# Patient Record
Sex: Female | Born: 1935 | Race: White | Hispanic: No | Marital: Married | Smoking: Never smoker
Health system: Southern US, Community
[De-identification: ages and names within clinical notes are randomized; demographics above are authoritative.]

## PROBLEM LIST (undated history)

## (undated) DIAGNOSIS — F329 Major depressive disorder, single episode, unspecified: Secondary | ICD-10-CM

## (undated) DIAGNOSIS — I509 Heart failure, unspecified: Secondary | ICD-10-CM

## (undated) DIAGNOSIS — E785 Hyperlipidemia, unspecified: Secondary | ICD-10-CM

## (undated) DIAGNOSIS — R002 Palpitations: Secondary | ICD-10-CM

## (undated) DIAGNOSIS — K219 Gastro-esophageal reflux disease without esophagitis: Secondary | ICD-10-CM

## (undated) DIAGNOSIS — I517 Cardiomegaly: Secondary | ICD-10-CM

## (undated) DIAGNOSIS — Z8719 Personal history of other diseases of the digestive system: Secondary | ICD-10-CM

## (undated) DIAGNOSIS — R0600 Dyspnea, unspecified: Secondary | ICD-10-CM

## (undated) DIAGNOSIS — R011 Cardiac murmur, unspecified: Secondary | ICD-10-CM

## (undated) DIAGNOSIS — H9190 Unspecified hearing loss, unspecified ear: Secondary | ICD-10-CM

## (undated) DIAGNOSIS — E119 Type 2 diabetes mellitus without complications: Secondary | ICD-10-CM

## (undated) DIAGNOSIS — R7611 Nonspecific reaction to tuberculin skin test without active tuberculosis: Secondary | ICD-10-CM

## (undated) DIAGNOSIS — N390 Urinary tract infection, site not specified: Secondary | ICD-10-CM

## (undated) DIAGNOSIS — F419 Anxiety disorder, unspecified: Secondary | ICD-10-CM

## (undated) DIAGNOSIS — F32A Depression, unspecified: Secondary | ICD-10-CM

## (undated) DIAGNOSIS — Z951 Presence of aortocoronary bypass graft: Secondary | ICD-10-CM

## (undated) DIAGNOSIS — I10 Essential (primary) hypertension: Secondary | ICD-10-CM

## (undated) DIAGNOSIS — Z9889 Other specified postprocedural states: Secondary | ICD-10-CM

## (undated) DIAGNOSIS — I341 Nonrheumatic mitral (valve) prolapse: Secondary | ICD-10-CM

## (undated) DIAGNOSIS — I2510 Atherosclerotic heart disease of native coronary artery without angina pectoris: Secondary | ICD-10-CM

## (undated) DIAGNOSIS — R112 Nausea with vomiting, unspecified: Secondary | ICD-10-CM

## (undated) DIAGNOSIS — I340 Nonrheumatic mitral (valve) insufficiency: Secondary | ICD-10-CM

## (undated) DIAGNOSIS — M1990 Unspecified osteoarthritis, unspecified site: Secondary | ICD-10-CM

## (undated) DIAGNOSIS — T7840XA Allergy, unspecified, initial encounter: Secondary | ICD-10-CM

## (undated) DIAGNOSIS — R51 Headache: Secondary | ICD-10-CM

## (undated) DIAGNOSIS — Z953 Presence of xenogenic heart valve: Secondary | ICD-10-CM

## (undated) HISTORY — DX: Major depressive disorder, single episode, unspecified: F32.9

## (undated) HISTORY — PX: PELVIC FLOOR REPAIR: SHX2192

## (undated) HISTORY — PX: ROTATOR CUFF REPAIR: SHX139

## (undated) HISTORY — PX: REDUCTION MAMMAPLASTY: SUR839

## (undated) HISTORY — PX: CATARACT EXTRACTION: SUR2

## (undated) HISTORY — DX: Nonrheumatic mitral (valve) prolapse: I34.1

## (undated) HISTORY — DX: Essential (primary) hypertension: I10

## (undated) HISTORY — DX: Cardiac murmur, unspecified: R01.1

## (undated) HISTORY — PX: EYE SURGERY: SHX253

## (undated) HISTORY — PX: COLONOSCOPY: SHX174

## (undated) HISTORY — DX: Gastro-esophageal reflux disease without esophagitis: K21.9

## (undated) HISTORY — DX: Hyperlipidemia, unspecified: E78.5

## (undated) HISTORY — PX: BACK SURGERY: SHX140

## (undated) HISTORY — DX: Depression, unspecified: F32.A

## (undated) HISTORY — PX: CARDIAC CATHETERIZATION: SHX172

## (undated) HISTORY — DX: Nonspecific reaction to tuberculin skin test without active tuberculosis: R76.11

## (undated) HISTORY — DX: Unspecified osteoarthritis, unspecified site: M19.90

## (undated) HISTORY — DX: Allergy, unspecified, initial encounter: T78.40XA

## (undated) HISTORY — DX: Palpitations: R00.2

## (undated) HISTORY — PX: ABDOMINAL HYSTERECTOMY: SHX81

## (undated) HISTORY — PX: TONSILLECTOMY AND ADENOIDECTOMY: SUR1326

## (undated) HISTORY — DX: Cardiomegaly: I51.7

## (undated) HISTORY — DX: Nonrheumatic mitral (valve) insufficiency: I34.0

---

## 1998-11-02 ENCOUNTER — Ambulatory Visit (HOSPITAL_COMMUNITY): Admission: RE | Admit: 1998-11-02 | Discharge: 1998-11-02 | Payer: Self-pay | Admitting: Gastroenterology

## 1999-04-25 ENCOUNTER — Encounter: Payer: Self-pay | Admitting: Cardiology

## 1999-04-25 ENCOUNTER — Inpatient Hospital Stay (HOSPITAL_COMMUNITY): Admission: EM | Admit: 1999-04-25 | Discharge: 1999-04-26 | Payer: Self-pay | Admitting: Emergency Medicine

## 1999-05-09 ENCOUNTER — Ambulatory Visit (HOSPITAL_COMMUNITY): Admission: RE | Admit: 1999-05-09 | Discharge: 1999-05-09 | Payer: Self-pay | Admitting: Family Medicine

## 1999-07-31 ENCOUNTER — Other Ambulatory Visit: Admission: RE | Admit: 1999-07-31 | Discharge: 1999-07-31 | Payer: Self-pay | Admitting: Gynecology

## 1999-08-31 ENCOUNTER — Encounter: Payer: Self-pay | Admitting: Family Medicine

## 1999-08-31 ENCOUNTER — Encounter: Admission: RE | Admit: 1999-08-31 | Discharge: 1999-08-31 | Payer: Self-pay | Admitting: Family Medicine

## 1999-09-08 ENCOUNTER — Encounter: Admission: RE | Admit: 1999-09-08 | Discharge: 1999-09-08 | Payer: Self-pay | Admitting: Family Medicine

## 1999-09-08 ENCOUNTER — Encounter: Payer: Self-pay | Admitting: Family Medicine

## 2000-08-19 ENCOUNTER — Other Ambulatory Visit: Admission: RE | Admit: 2000-08-19 | Discharge: 2000-08-19 | Payer: Self-pay | Admitting: Gynecology

## 2000-09-11 ENCOUNTER — Encounter: Admission: RE | Admit: 2000-09-11 | Discharge: 2000-09-11 | Payer: Self-pay | Admitting: Gynecology

## 2000-09-11 ENCOUNTER — Encounter: Payer: Self-pay | Admitting: Gynecology

## 2001-09-16 ENCOUNTER — Other Ambulatory Visit: Admission: RE | Admit: 2001-09-16 | Discharge: 2001-09-16 | Payer: Self-pay | Admitting: Gynecology

## 2001-09-25 ENCOUNTER — Encounter: Payer: Self-pay | Admitting: Gynecology

## 2001-09-25 ENCOUNTER — Encounter: Admission: RE | Admit: 2001-09-25 | Discharge: 2001-09-25 | Payer: Self-pay | Admitting: Gynecology

## 2001-09-26 ENCOUNTER — Encounter: Payer: Self-pay | Admitting: Gynecology

## 2001-09-26 ENCOUNTER — Encounter: Admission: RE | Admit: 2001-09-26 | Discharge: 2001-09-26 | Payer: Self-pay | Admitting: Gynecology

## 2002-01-05 ENCOUNTER — Encounter: Payer: Self-pay | Admitting: Orthopaedic Surgery

## 2002-01-05 ENCOUNTER — Ambulatory Visit (HOSPITAL_COMMUNITY): Admission: RE | Admit: 2002-01-05 | Discharge: 2002-01-05 | Payer: Self-pay | Admitting: Orthopaedic Surgery

## 2002-02-25 ENCOUNTER — Ambulatory Visit (HOSPITAL_BASED_OUTPATIENT_CLINIC_OR_DEPARTMENT_OTHER): Admission: RE | Admit: 2002-02-25 | Discharge: 2002-02-26 | Payer: Self-pay | Admitting: Plastic Surgery

## 2002-02-25 ENCOUNTER — Encounter (INDEPENDENT_AMBULATORY_CARE_PROVIDER_SITE_OTHER): Payer: Self-pay | Admitting: *Deleted

## 2002-02-25 HISTORY — PX: BREAST ENHANCEMENT SURGERY: SHX7

## 2002-10-07 ENCOUNTER — Other Ambulatory Visit: Admission: RE | Admit: 2002-10-07 | Discharge: 2002-10-07 | Payer: Self-pay | Admitting: Gynecology

## 2002-10-08 ENCOUNTER — Encounter: Payer: Self-pay | Admitting: Gynecology

## 2002-10-08 ENCOUNTER — Encounter: Admission: RE | Admit: 2002-10-08 | Discharge: 2002-10-08 | Payer: Self-pay | Admitting: Gynecology

## 2003-04-14 ENCOUNTER — Encounter: Admission: RE | Admit: 2003-04-14 | Discharge: 2003-04-14 | Payer: Self-pay | Admitting: Family Medicine

## 2003-04-14 ENCOUNTER — Encounter: Payer: Self-pay | Admitting: Family Medicine

## 2003-04-26 ENCOUNTER — Encounter: Payer: Self-pay | Admitting: Family Medicine

## 2003-04-26 ENCOUNTER — Encounter: Admission: RE | Admit: 2003-04-26 | Discharge: 2003-04-26 | Payer: Self-pay | Admitting: Family Medicine

## 2003-06-11 ENCOUNTER — Encounter: Admission: RE | Admit: 2003-06-11 | Discharge: 2003-06-11 | Payer: Self-pay | Admitting: Neurological Surgery

## 2003-06-11 ENCOUNTER — Encounter: Payer: Self-pay | Admitting: Neurological Surgery

## 2003-11-03 ENCOUNTER — Encounter: Admission: RE | Admit: 2003-11-03 | Discharge: 2003-11-03 | Payer: Self-pay | Admitting: Gynecology

## 2003-11-30 ENCOUNTER — Other Ambulatory Visit: Admission: RE | Admit: 2003-11-30 | Discharge: 2003-11-30 | Payer: Self-pay | Admitting: Gynecology

## 2004-08-08 ENCOUNTER — Encounter: Admission: RE | Admit: 2004-08-08 | Discharge: 2004-08-08 | Payer: Self-pay | Admitting: Rheumatology

## 2004-09-21 ENCOUNTER — Observation Stay (HOSPITAL_COMMUNITY): Admission: RE | Admit: 2004-09-21 | Discharge: 2004-09-22 | Payer: Self-pay | Admitting: Gynecology

## 2004-09-21 HISTORY — PX: OTHER SURGICAL HISTORY: SHX169

## 2004-11-10 ENCOUNTER — Encounter: Admission: RE | Admit: 2004-11-10 | Discharge: 2004-11-10 | Payer: Self-pay | Admitting: Gynecology

## 2005-03-28 ENCOUNTER — Other Ambulatory Visit: Admission: RE | Admit: 2005-03-28 | Discharge: 2005-03-28 | Payer: Self-pay | Admitting: Gynecology

## 2005-04-24 ENCOUNTER — Ambulatory Visit: Payer: Self-pay | Admitting: Cardiology

## 2006-01-08 ENCOUNTER — Encounter: Admission: RE | Admit: 2006-01-08 | Discharge: 2006-01-08 | Payer: Self-pay | Admitting: Gynecology

## 2006-05-10 ENCOUNTER — Ambulatory Visit: Payer: Self-pay | Admitting: Cardiology

## 2006-11-26 ENCOUNTER — Encounter: Admission: RE | Admit: 2006-11-26 | Discharge: 2006-11-26 | Payer: Self-pay | Admitting: Family Medicine

## 2006-12-03 ENCOUNTER — Other Ambulatory Visit: Admission: RE | Admit: 2006-12-03 | Discharge: 2006-12-03 | Payer: Self-pay | Admitting: Gynecology

## 2007-02-13 ENCOUNTER — Encounter: Admission: RE | Admit: 2007-02-13 | Discharge: 2007-02-13 | Payer: Self-pay | Admitting: Gynecology

## 2007-05-21 ENCOUNTER — Ambulatory Visit: Payer: Self-pay | Admitting: Cardiology

## 2007-05-31 ENCOUNTER — Encounter: Admission: RE | Admit: 2007-05-31 | Discharge: 2007-05-31 | Payer: Self-pay | Admitting: Family Medicine

## 2007-06-25 ENCOUNTER — Ambulatory Visit: Payer: Self-pay

## 2007-06-25 ENCOUNTER — Encounter: Payer: Self-pay | Admitting: Cardiology

## 2008-01-22 ENCOUNTER — Encounter: Admission: RE | Admit: 2008-01-22 | Discharge: 2008-01-22 | Payer: Self-pay | Admitting: Gastroenterology

## 2008-02-03 ENCOUNTER — Ambulatory Visit: Payer: Self-pay | Admitting: Cardiology

## 2008-02-24 ENCOUNTER — Encounter: Admission: RE | Admit: 2008-02-24 | Discharge: 2008-02-24 | Payer: Self-pay | Admitting: Family Medicine

## 2008-07-26 ENCOUNTER — Encounter: Admission: RE | Admit: 2008-07-26 | Discharge: 2008-07-26 | Payer: Self-pay | Admitting: Gastroenterology

## 2008-08-01 ENCOUNTER — Encounter: Admission: RE | Admit: 2008-08-01 | Discharge: 2008-08-01 | Payer: Self-pay | Admitting: Gastroenterology

## 2009-01-21 ENCOUNTER — Encounter: Admission: RE | Admit: 2009-01-21 | Discharge: 2009-01-21 | Payer: Self-pay | Admitting: Gastroenterology

## 2009-02-16 ENCOUNTER — Encounter: Payer: Self-pay | Admitting: Cardiology

## 2009-02-26 DIAGNOSIS — I080 Rheumatic disorders of both mitral and aortic valves: Secondary | ICD-10-CM | POA: Insufficient documentation

## 2009-02-26 DIAGNOSIS — I517 Cardiomegaly: Secondary | ICD-10-CM | POA: Insufficient documentation

## 2009-02-26 DIAGNOSIS — I059 Rheumatic mitral valve disease, unspecified: Secondary | ICD-10-CM | POA: Insufficient documentation

## 2009-02-26 DIAGNOSIS — R002 Palpitations: Secondary | ICD-10-CM | POA: Insufficient documentation

## 2009-03-08 ENCOUNTER — Ambulatory Visit: Payer: Self-pay | Admitting: Cardiology

## 2009-03-18 ENCOUNTER — Encounter: Admission: RE | Admit: 2009-03-18 | Discharge: 2009-03-18 | Payer: Self-pay | Admitting: Gynecology

## 2009-04-13 ENCOUNTER — Encounter: Admission: RE | Admit: 2009-04-13 | Discharge: 2009-04-13 | Payer: Self-pay | Admitting: Orthopedic Surgery

## 2009-04-14 ENCOUNTER — Ambulatory Visit (HOSPITAL_BASED_OUTPATIENT_CLINIC_OR_DEPARTMENT_OTHER): Admission: RE | Admit: 2009-04-14 | Discharge: 2009-04-14 | Payer: Self-pay | Admitting: Orthopedic Surgery

## 2009-07-29 ENCOUNTER — Encounter: Admission: RE | Admit: 2009-07-29 | Discharge: 2009-07-29 | Payer: Self-pay | Admitting: Gastroenterology

## 2009-09-10 HISTORY — PX: BREAST SURGERY: SHX581

## 2009-09-26 ENCOUNTER — Telehealth (INDEPENDENT_AMBULATORY_CARE_PROVIDER_SITE_OTHER): Payer: Self-pay | Admitting: *Deleted

## 2010-02-14 ENCOUNTER — Ambulatory Visit: Payer: Self-pay | Admitting: Cardiology

## 2010-02-14 DIAGNOSIS — I10 Essential (primary) hypertension: Secondary | ICD-10-CM | POA: Insufficient documentation

## 2010-03-29 ENCOUNTER — Encounter: Admission: RE | Admit: 2010-03-29 | Discharge: 2010-03-29 | Payer: Self-pay | Admitting: Gynecology

## 2010-07-10 ENCOUNTER — Ambulatory Visit (HOSPITAL_COMMUNITY): Admission: RE | Admit: 2010-07-10 | Discharge: 2010-07-10 | Payer: Self-pay | Admitting: Neurological Surgery

## 2010-07-26 ENCOUNTER — Encounter: Admission: RE | Admit: 2010-07-26 | Discharge: 2010-07-26 | Payer: Self-pay | Admitting: Gastroenterology

## 2010-08-14 ENCOUNTER — Inpatient Hospital Stay (HOSPITAL_COMMUNITY)
Admission: RE | Admit: 2010-08-14 | Discharge: 2010-08-21 | Payer: Self-pay | Source: Home / Self Care | Attending: Neurological Surgery | Admitting: Neurological Surgery

## 2010-10-02 ENCOUNTER — Encounter: Payer: Self-pay | Admitting: Gynecology

## 2010-10-10 NOTE — Assessment & Plan Note (Signed)
Summary: 1610   Primary Provider:  Donia Guiles MD  CC:  Yearly rov/ pt reports edema in left leg and discoloration.[name]  History of Present Illness: [name] comes in today for evaluation and management of her history of mitral valve prolapse, mild mitral regurgitation, palpitations, and her hypertension.  She has complained of some lower extremity edema and color changes in her feet when she sits for prolonged periods of time. She wonders if she has PAD.  She clearly has varicose veins. There is no history of saline as her DVT. She has no claudication.  She has palpitations at night which she equates with her reflux. She is taking Protonix twice a day. When she sits up and burped a few times the palpitations improve.  She denies orthopnea, PND, or syncope.  Current Medications (verified): 1)  Lisinopril-Hydrochlorothiazide 10-12.5 Mg Tabs (Lisinopril-Hydrochlorothiazide) .[name].. 1 Tab Once Daily 2)  Protonix 40 Mg Tbec (Pantoprazole Sodium) .[name].. 1 Tab Two Times A Day 3)  Diltiazem Hcl Er Beads 240 Mg Xr24h-Cap (Diltiazem Hcl Er Beads) .[name].. 1 Cap Once Daily 4)  Hyoscyamine Sulfate 0.125 Mg Tabs (Hyoscyamine Sulfate) .... As Needed 5)  Lorazepam 1 Mg Tabs (Lorazepam) .... As Needed 6)  Citalopram Hydrobromide 10 Mg Tabs (Citalopram Hydrobromide) .... Take One Tablet Once Daily  Allergies (verified): 1)  ! Sulfa 2)  ! Codeine  Past History:  Past Medical History: Last updated: Feb 28, 2009 ATRIAL ENLARGEMENT, LEFT (ICD-429.3) MITRAL REGURGITATION (ICD-396.3) MITRAL VALVE PROLAPSE (ICD-424.0) PALPITATIONS (ICD-785.1)    Past Surgical History: Last updated: 02/28/2009  Anterior posterior and enterocele repairs with uterosacral cardinal colposuspension, partial colpocleisis.  SURGEON:  Gretta Cool, M.D.[name]. 09/21/2004  Bilateral reduction mammoplasty and excision of accessory breast tissue underneath the left breast. SURGEON:  Consuello Bossier., M.D.[name]. 02/25/02  Family  History: Last updated: 28-Feb-2009 Mother: deceased at 27..homocide Father: deceased at 22..heart faliure  Social History: Last updated: 02/28/2009 Retired  Married  Tobacco Use - No.  Alcohol Use - yes Regular Exercise - yes Drug Use - no  Risk Factors: Exercise: yes (2009/02/28)  Risk Factors: Smoking Status: never (02/28/09)  Review of Systems       negative other than history of present illness  Vital Signs:  Patient profile:   77 year old female Height:      66 inches Weight:      129 pounds BMI:     20.90 Pulse rate:   76 / minute Pulse rhythm:   irregular BP sitting:   138 / 72  (left arm) Cuff size:   regular  Vitals Entered By: Judithe Modest CMA (February 14, 2010 9:39 AM)  Physical Exam  General:  no acute distress, she has lost considerable weight from recent surgeries Head:  normocephalic and atraumatic Eyes:  PERRLA/EOM intact; conjunctiva and lids normal. Neck:  Neck supple, no JVD. No masses, thyromegaly or abnormal cervical nodes. Chest Ezana Hubbert:  no deformities or breast masses noted Lungs:  Clear bilaterally to auscultation and percussion. Heart:  PMI nondisplaced, normal S1, systolic murmur with no obvious click. Normal S2. No gallop Msk:  Back normal, normal gait. Muscle strength and tone normal. Pulses:  dorsalis pedis 2+ over 4+ bilaterally, posterior tibial 1+ over 4+. Her toes are cool with decreased capillary reflex. Extremities:  trace left pedal edema and trace right pedal edema. varicose veins are present without signs of cellulitis or DVT. Neurologic:  Alert and oriented x 3. Skin:  Intact without lesions or rashes. Psych:  Normal  affect.   Problems:  Medical Problems Added: 1)  Dx of Hypertension, Benign  (ICD-401.1)  EKG  Procedure date:  02/14/2010  Findings:      normal sinus rhythm with occasional PVC, no acute changes.  Impression & Recommendations:  Problem # 1:  MITRAL VALVE PROLAPSE (ICD-424.0) Assessment  Unchanged  Her exam is stable I see no reason to repeat her echo. Her updated medication list for this problem includes:    Lisinopril-hydrochlorothiazide 10-12.5 Mg Tabs (Lisinopril-hydrochlorothiazide) .[name]... 1 tab once daily  Orders: EKG w/ Interpretation (93000)  Problem # 2:  PALPITATIONS (ICD-785.1) Assessment: Unchanged  These seem to be related to some reflux symptoms. I have made her aware that diltiazem can lower esophageal sphincter tone and may make this worse. She would like to stay on this medicine since she's doing so well with it otherwise. She will continue protonic twice a day. Her updated medication list for this problem includes:    Lisinopril-hydrochlorothiazide 10-12.5 Mg Tabs (Lisinopril-hydrochlorothiazide) .[name]... 1 tab once daily    Diltiazem Hcl Er Beads 240 Mg Xr24h-cap (Diltiazem hcl er beads) .[name]... 1 cap once daily  Orders: EKG w/ Interpretation (93000)  Problem # 3:  HYPERTENSION, BENIGN (ICD-401.1) Assessment: Unchanged  Her updated medication list for this problem includes:    Lisinopril-hydrochlorothiazide 10-12.5 Mg Tabs (Lisinopril-hydrochlorothiazide) .[name]... 1 tab once daily    Diltiazem Hcl Er Beads 240 Mg Xr24h-cap (Diltiazem hcl er beads) .[name]... 1 cap once daily  Problem # 4:  MITRAL REGURGITATION (ICD-396.3) Assessment: Unchanged  Orders: EKG w/ Interpretation (93000)  Patient Instructions: 1)  Your physician recommends that you schedule a follow-up appointment in: 1 year with Dr. Daleen Squibb 2)  Your physician recommends that you continue on your current medications as directed. Please refer to the Current Medication list given to you today. Prescriptions: DILTIAZEM HCL ER BEADS 240 MG XR24H-CAP (DILTIAZEM HCL ER BEADS) 1 cap once daily  #90 x 4   Entered by:   Lisabeth Devoid RN   Authorized by:   Gaylord Shih, MD, [facility]   Signed by:   Lisabeth Devoid RN on 02/14/2010   Method used:   Faxed to ...       [facility]  (mail-order)             , [place]         Ph:        Fax: [phone]   RxID:   [id number] LISINOPRIL-HYDROCHLOROTHIAZIDE 10-12.5 MG TABS (LISINOPRIL-HYDROCHLOROTHIAZIDE) 1 tab once daily  #90 x 4   Entered by:   Lisabeth Devoid RN   Authorized by:   Gaylord Shih, MD, [facility]   Signed by:   Lisabeth Devoid RN on 02/14/2010   Method used:   Faxed to ...       [facility] (mail-order)             , [place]         Ph:        Fax: [phone]   RxID:   [id number]

## 2010-10-10 NOTE — Progress Notes (Signed)
  Phone Note Other Incoming   Caller: Rosalita Chessman Action Taken: Information Sent Initial call taken by: Denny Peon    Faxed 12 lead,LOV, over to [facility] to fax [phone] [facility]  September 26, 2009 9:22 AM

## 2010-11-02 NOTE — Discharge Summary (Signed)
NAMEOCTA, UPLINGER                 ACCOUNT NO.:  [id number]  MEDICAL RECORD NO.:  [id number]          PATIENT TYPE:  INP  LOCATION:  3002                         FACILITY:  [facility]  PHYSICIAN:  Stefani Dama, M.D.  DATE OF BIRTH:  02/23/1934  DATE OF ADMISSION:  08/14/2010 DATE OF DISCHARGE:  08/21/2010                              DISCHARGE SUMMARY   ADMITTING DIAGNOSIS:  Lumbar spondylosis and multifactorial spinal stenosis and degenerative disk disease L1-2, L2-3, L3-4, and L4-5.  DISCHARGE DIAGNOSIS:  Lumbar spondylosis and multifactorial spinal stenosis and degenerative disk disease L1-2, L2-3, L3-4, and L4-5.  OPERATIONS AND PROCEDURES:  Anterolateral decompression and fusion L1-2, L2-3, L3-4, and L4-5 stage I done on August 14, 2010 followed by second operation on August 17, 2010 of posterior supplemental pedicle screw fixation, minimally invasive L1-2, L2-3, L3-4 and L4-5.  BRIEF HISTORY AND HOSPITAL COURSE:  The patient is a 77 year old female who has degenerative lumbar scoliosis and spondylosis with lumbar radiculopathy L1-2, L2-3, L3-4, and L4-5 and she has had increasing difficulty over the last several years.  She has failed all conservative measurements and her degenerative scoliosis curve has progressed substantially in the last year, has been advised she undergo surgical decompression of two-staged procedure, anterolateral derotation and decompression using XLIF technique and then followed by minimally invasive posterior spinal pedicle screw fixation percutaneously L1 through L5.  She elects to proceed, tolerates stage I on August 14, 2010 well postoperatively.  She was first day doing well, getting out of bed, eating breakfast, vital signs stable, afebrile, neurovascularly intact, no focal deficits.  Started with physical therapy, occupational therapy, kept on her home regimen of medications, given Toradol for pain control and her Foley catheter was  discontinued.  She continue to make good progress, was ready for stage II on August 17, 2010.  She tolerated the posterior supplemental fixation at L1-L5 with pedicle screw fixation percutaneously.  First day postoperatively from that, she did have some right quad pain.  Her dressing was dry.  She was neurovascularly intact and once again, was initiated therapy with physical therapy and mobilization.  She was transferred out to the Neurosurgical ICU.  Her Foley catheter was discontinued.  She was placed on PCA pain pumps after both surgeries postoperatively and then weaned to p.o. pain medications.  She has some IV Toradol for pain control as well and she was ready for discharge home on August 21, 2010, eating well, voiding well, ambulating safely.  She had learned her back precautions, taking Dilaudid and Valium for pain control.  DISCHARGE CONDITION:  Stable and improved.  DISCHARGE INSTRUCTIONS:  Discharge home.  Continue on her home medications of diltiazem 240 mg one p.o. daily; Pepcid 20 mg one p.o. daily; hyoscyamine 125 mcg one p.o. daily; lorazepam 0.5 mg one p.o. daily; Protonix 40 mg one p.o. nightly; lisinopril/hydrochlorothiazide 10/12.5 one p.o. daily; MiraLax one capsule b.i.d. and a good home bowel regimen.  She was also given Dilaudid 4 mg one p.o. q.4-6 h. p.r.n. pain and Valium 5 mg one p.o. q.8-12 h. p.r.n. muscle spasm.  She is to follow up in our  office in 3-4 weeks for postoperative check. Contact our office prior to followup if any question or concerns. Continue with progressive ambulation, 3-4 short walks a day.  Increase for distance and time if pain allows.  Contact our office if she has any problems or concerns.  Home health durable medical equipment is ordered and home health physical therapy.     Aura [facility].   ______________________________ Stefani Dama, M.D.    SCI/MEDQ  D:  09/28/2010  T:  09/28/2010  Job:   [id number]  Electronically Signed by Orlin Hilding P.A. on 10/25/2010 05:02:01 PM Electronically Signed by Barnett Abu M.D. on 11/02/2010 08:01:10 AM

## 2010-11-21 LAB — CBC
MCH: 31.8 pg (ref 26.0–34.0)
MCHC: 33.9 g/dL (ref 30.0–36.0)
Platelets: 292 10*3/uL (ref 150–400)
RBC: 3.62 MIL/uL — ABNORMAL LOW (ref 3.87–5.11)
RDW: 13.3 % (ref 11.5–15.5)

## 2010-11-21 LAB — BASIC METABOLIC PANEL
BUN: 10 mg/dL (ref 6–23)
CO2: 27 mEq/L (ref 19–32)
Calcium: 8.7 mg/dL (ref 8.4–10.5)
Creatinine, Ser: 0.73 mg/dL (ref 0.4–1.2)
GFR calc Af Amer: >60 mL/min (ref >60)

## 2010-11-22 LAB — BASIC METABOLIC PANEL
BUN: 13 mg/dL (ref 6–23)
Calcium: 9.9 mg/dL (ref 8.4–10.5)
Chloride: 102 mEq/L (ref 96–112)
Creatinine, Ser: 0.81 mg/dL (ref 0.4–1.2)
GFR calc Af Amer: >60 mL/min (ref >60)
GFR calc non Af Amer: >60 mL/min (ref >60)

## 2010-11-22 LAB — CBC
Platelets: 348 10*3/uL (ref 150–400)
RBC: 4.55 MIL/uL (ref 3.87–5.11)
RDW: 13.2 % (ref 11.5–15.5)
WBC: 7.7 10*3/uL (ref 4.0–10.5)

## 2010-11-22 LAB — SURGICAL PCR SCREEN
MRSA, PCR: NEGATIVE
Staphylococcus aureus: NEGATIVE

## 2010-11-22 LAB — TYPE AND SCREEN
ABO/RH(D): O NEG
Antibody Screen: NEGATIVE

## 2010-12-17 LAB — BASIC METABOLIC PANEL
CO2: 27 mEq/L (ref 19–32)
Calcium: 9.4 mg/dL (ref 8.4–10.5)
Chloride: 98 mEq/L (ref 96–112)
Creatinine, Ser: 0.71 mg/dL (ref 0.4–1.2)
Glucose, Bld: 148 mg/dL — ABNORMAL HIGH (ref 70–99)
Potassium: 3.6 mEq/L (ref 3.5–5.1)
Sodium: 133 mEq/L — ABNORMAL LOW (ref 135–145)

## 2010-12-22 ENCOUNTER — Other Ambulatory Visit (HOSPITAL_COMMUNITY): Payer: Self-pay | Admitting: Neurological Surgery

## 2010-12-22 DIAGNOSIS — M545 Low back pain, unspecified: Secondary | ICD-10-CM

## 2011-01-15 ENCOUNTER — Ambulatory Visit (HOSPITAL_COMMUNITY)
Admission: RE | Admit: 2011-01-15 | Discharge: 2011-01-15 | Disposition: A | Discharge status: Home / Self Care | Payer: Medicare Other | Source: Ambulatory Visit | Attending: Neurological Surgery | Admitting: Neurological Surgery

## 2011-01-15 DIAGNOSIS — M545 Low back pain, unspecified: Secondary | ICD-10-CM

## 2011-01-15 DIAGNOSIS — M79609 Pain in unspecified limb: Secondary | ICD-10-CM | POA: Insufficient documentation

## 2011-01-15 DIAGNOSIS — Z981 Arthrodesis status: Secondary | ICD-10-CM | POA: Insufficient documentation

## 2011-01-15 DIAGNOSIS — M4180 Other forms of scoliosis, site unspecified: Secondary | ICD-10-CM | POA: Insufficient documentation

## 2011-01-15 MED ORDER — IOHEXOL 180 MG/ML  SOLN
20.0000 mL | Freq: Once | INTRAMUSCULAR | Status: AC | PRN
  Administered 2011-01-15: 20 mL via INTRATHECAL

## 2011-01-23 NOTE — Assessment & Plan Note (Signed)
[facility]                            CARDIOLOGY OFFICE NOTE   NAME:Mccarthy, Lori E                        MRN:          [id number]  DATE:02/03/2008                            DOB:          February 15, 1934    [name] comes in today because of increased palpitations.   She has been taking a fair amount of decongestants over the last couple  of months.  She does not know the name of the drug today, but she gets  it behind-the-counter.   She is very compliant with her other medications including diltiazem  extended release 240 mg a day and lisinopril and hydrochlorothiazide  10/12.5 mg.   Her last echocardiogram showed prolapse with EF of 60%.  She had mild  left atrial enlargement.  There was mild mitral regurgitation.   Her other medications were simvastatin 40 mg nightly, pantoprazole 40 mg  b.i.d., and Lexapro 10 mg nightly.   PHYSICAL EXAMINATION:  Her blood pressure today is 116/68, which is very  good for her.  Her heart rate is 80.  An EKG shows frequent PVCs that  are multifocal.  They have right bundle branch block morphology.  Her  weight is 143, down 10, which she has lost by reducing her carb intake.  HEENT:  Unchanged.  Carotid upstrokes equal bilaterally without bruits.  No JVD.  Thyroid is  not enlarged.  Trachea is midline.  LUNGS:  Clear.  HEART:  Irregular rate and rhythm.  No gallop.  ABDOMEN:  Soft and good bowel sounds.  No midline bruits.  EXTREMITIES:  No sinus, clubbing, or edema.  Pulses are intact.   At this point in time, I have asked [name] to eliminate the  decongestants.  If this does not improve her symptoms, we can go up on  diltiazem to 360 mg a day.  If this does not work, we can switch it to a  beta-blocker, which I think she probably would not tolerate very well.  She will let us know.  Otherwise, we will see her back in a year.     Thomas C. Daleen Squibb, MD, [facility]  Electronically Signed    TCW/MedQ  DD:  02/03/2008  DT: 02/03/2008  Job #: [id number]   cc:   Gretta Cool, M.D.  Donia Guiles, M.D.

## 2011-01-23 NOTE — Op Note (Signed)
NAMEJULIANY, [name]                 ACCOUNT NO.:  [id number]   MEDICAL RECORD NO.:  [id number]          PATIENT TYPE:  AMB   LOCATION:  DSC                          FACILITY:  [facility]   PHYSICIAN:  Rodney A. Mortenson, M.D.DATE OF BIRTH:  02-12-1934   DATE OF PROCEDURE:  04/14/2009  DATE OF DISCHARGE:                               OPERATIVE REPORT   JUSTIFICATION:  This 77 year old female starting January of this year  developed a persistent right shoulder pain.  She had a subacromial  injection, gave relief for about 3 months.  Because of persistent pain,  an MRI which showed some degenerative changes in the distal end of the  supraspinatus but no tear was seen.  AC joint was normal and type 1  acromion.  Pain in the shoulder has gotten progressively worse.  Because  of failure of conservative care, she is now admitted for arthroscopic  evaluation.  Complications discussed preoperatively.  Questions were  answered and encouraged.  I believe she probably has a full-thickness  tear, which did not show up on the MRI because of her persistent pain  and discomfort.  The patient clearly understands that no guarantee can  be given.   JUSTIFICATION FOR OUTPATIENT SURGERY:  Minimal morbidity.   PREOPERATIVE DIAGNOSES:  1. Supraspinatus bursitis, right shoulder.  2. Possible partial tear rotator cuff, right shoulder.   POSTOPERATIVE DIAGNOSIS:  Full-thickness retracted tear of the  supraspinatus right shoulder.   OPERATIONS:  Diagnostic arthroscopy; open acromioplasty and open repair  of retracted full-thickness tear rotator cuff using Biomet PEEK anchors.   SURGEON:  Lenard Galloway. Chaney Malling, MD   ANESTHESIA:  General.   PROCEDURE:  The patient was placed on the operating table in supine  position.  After satisfactory general anesthesia, the patient was placed  in the sitting position.  Right upper extremity and shoulder was prepped  with DuraPrep and draped out in the usual manner.  An  arthroscope was  placed in the posterior portal and anterior portals made for the  operative instruments.  Very careful examination of the shoulder was  undertaken.  The articular cartilage of the humeral head and the glenoid  was absolutely normal as was the subscapularis and the labrum.  Biceps  tendon appeared normal with good anchor and superior margin of the  glenoid.  There was marked fraying and synovitis adjacent to the biceps  tendon. The rotator cuff which had a full thickness and retracted tear.  One could view the subacromial space in the glenohumeral joint.  The  arthroscope was then placed in subacromial space and this confirmed a  large retracted rotator cuff tear.  From the subacromial area, one could  see the glenoid.  The arthroscope was then removed.It should be noted  that without diaganostic arthroscopy the correct diagnosis would not  have been made. The MRI was negative.   Saber-cut incision made over the anterolateral aspect of the right  shoulder.  Skin edges were retracted.  Bleeders coagulated.  Deltoid  fibers were split with a finger.  A self-retaining retractor was put in  place.  There was a very large bursa in this area, and this was  meticulously removed.  Throughout the procedure, the shoulder was  irrigated with copious amounts of saline solution.  A large retracted  rotator cuff tear was seen.  This was markedly frayed and friable.  The  margins were excised.  The footprint was identified, and this was  debrided with a rongeur to give [place] bleeding bone.  A Biomet PEEK anchor  was then inserted in a standard manner with 2 FiberWire sutures from the  anchor and both the anterior and posterior leaves of the supraspinatus  was then brought down onto the footprint.  An absolute anatomic very  snug fitting stable repair was achieved.  Once this was accomplished to  my satisfaction, deltoid fibers were attached side-to-side with heavy  Vicryl.  A running  subcuticular stitch was then put in place to close  the skin.  Benzoin and Steri-Strips were applied, and the sterile  dressings were applied, and the patient returned to recovery room in  excellent condition.  Technically, this procedure went extremely well.  I was very pleased with surgical construct and stability and range of  motion.   DISPOSITION:  1. Dilaudid for pain.  2. Sling to be wear at night.  3. Usual postoperative instructions.  4. To my office on Wednesday.      Rodney A. Chaney Malling, M.D.  Electronically Signed     RAM/MEDQ  D:  04/14/2009  T:  04/15/2009  Job:  [id number]

## 2011-01-23 NOTE — Assessment & Plan Note (Signed)
[facility]                            CARDIOLOGY OFFICE NOTE   NAME:Mccarthy, Lori E                        MRN:          [id number]  DATE:05/21/2007                            DOB:          1934-02-04    SUBJECTIVE:  [name] returns today for the following issues:  1. Mitral valve prolapse.  2. Hypertension.  3. Palpitations.   She has been told whenever she goes to Dr. Roselie Skinner office or  elsewhere and has her heart rate checked that she is having palpitations  or premature beats.  She really doesn't feel these most of the time.   The last time she was in she was complaining of some dizziness that  sounded like orthostatic hypotension.  She is really not having that  now.  She was under a lot of stress at the time.  We made an effort to  change her diltiazem to 240 mg once a day but she is still taking 180  b.i.d.  She is on lisinopril/HCTZ 10/12.5 daily.   Her last 2-D echocardiogram was in 2005 which showed moderate mitral  valve prolapse with moderate regurgitation.  She had a mildly dilated  left atrium.   CURRENT MEDICATIONS:  1. Diltiazem 180 mg b.i.d.  2. Lisinopril/HCTZ 10/12.5 mg daily.  3. Lexapro 10 mg q.h.s.  4. Crestor 10 mg daily.  5. Vivelle patch.  6. Zegerid 40 mg p.o. daily.   PHYSICAL EXAMINATION:  VITAL SIGNS:  Blood pressure is 100/68, her pulse  is 68 and slightly irregular.  Her electrocardiogram shows sinus rhythm  with PAC's and PVC's.  Her PR, QRS and QTC intervals are normal.  Her  weight is 153.  HEENT:  Normocephalic, atraumatic.  Pupils equal, round, reactive to  light and accommodation..  Extraocular movements intact. Sclerae are  slightly injected.  Facial symmetry is normal.  NECK:  Carotids are full.  There is no jugular venous distention.  Thyroid is not enlarged.  Trachea is midline.  LUNGS:  Clear.  HEART:  Reveals a mid systolic click.  There is a soft systolic murmur  at the apex.  S2 split  physiologically.  ABDOMEN:  Soft, good bowel sounds.  EXTREMITIES:  Reveal no edema.  Pulses are intact.  NEUROLOGICAL:  Exam is intact.  SKIN:  Unremarkable.   DISCUSSION:  [name] is having more concerns about her palpitations  though she does not feel them most of the time.  She does have PAC's and  PVC's on her EKG today.  These are probably from her mitral valve  prolapse.  We have not performed a 2-D echocardiogram to objectively  assess this in three years.  We will obtain this.   I have reviewed her prescriptions and have told her to stay on the  current medications.  If her 2-D echocardiogram is unchanged, i.e., good  left ventricular function, moderate mitral valve prolapse with no  significant change, we will give reassurance and continue her current  program.  I will plan on seeing her back in one year.     Thomas C.  Daleen Squibb, MD, [facility]  Electronically Signed    TCW/MedQ  DD: 05/21/2007  DT: 05/22/2007  Job #: [id number]   cc:   Donia Guiles, M.D.  Gretta Cool, M.D.

## 2011-01-26 NOTE — H&P (Signed)
NAME:  [name], [name]                 ACCOUNT NO.:  [id number]   MEDICAL RECORD NO.:  [id number]          PATIENT TYPE:   LOCATION:                                 FACILITY:   PHYSICIAN:  Gretta Cool, M.D.      DATE OF BIRTH:   DATE OF ADMISSION:  09/21/2004  DATE OF DISCHARGE:                                HISTORY & PHYSICAL   A 77 year old gravida 4, para 1, AB3 with progressively severe pelvic organ  prolapse now with grade IV cystocele with enterocele, rectocele, and fascial  detachment with unsatisfactory conservative measures at containment and  control.  Now wishes definitive therapy by anterior/posterior enterocele  repairs, colposuspension, and partial upper colpocleisis.  Her husband is  not able to be sexually active.  We have explained possible slight  shortening of the vagina, possible need for dilators to return to sexual  activity.  She is now admitted for definitive therapy with repairs as above.   PAST MEDICAL HISTORY:  Usual childhood disease without sequela.   MEDICAL ILLNESSES:  1.  Hypertension.  2.  Dyslipidemia.  3.  Osteoporosis.  4.  IBS.  5.  Diverticulosis.  6.  Hiatal hernia.   PAST SURGICAL HISTORY:  1.  History of abdominal hysterectomy, salpingo-oophorectomy 1991 for      abnormal uterine bleeding recurrent and unresponsive.  2.  History of breast biopsy and breast reduction 2003.  3.  Rotator cuff surgery, Dr. Cleophas Dunker 2003.   CURRENT MEDICATIONS:  1.  Tricor 154 mg daily.  2.  Prevacid daily for hiatal hernia.  3.  Atenolol 25 mg b.i.d.  4.  HCTZ 25 mg daily.  5.  Anticholinergic for IBS.  6.  Estrace vaginal cream.  7.  Zelnorm b.i.d. for chronic constipation.   HABITS:  Denies ethanol or tobacco.  Denies recreational drugs.   FAMILY HISTORY:  Father died at 19 of MI.  Two brothers with hypertension.  Paternal grandmother and maternal grandfather with heart disease.  One  brother with history of MI.  No family history of cancer  or other hereditary  disease.   SOCIAL HISTORY:  Patient is retired along with her husband.  They have one  child early 77s.  No significant life stresses.   REVIEW OF SYSTEMS:  HEENT:  Denies symptoms.  CARDIORESPIRATORY:  Denies  asthma, cough, bronchitis, shortness of breath.  GI/GU:  Denies frequency,  urgency, dysuria, change in bowel habits, food intolerance.   PHYSICAL EXAMINATION:  GENERAL:  Well-developed, well-nourished, bronze-tone  skin, appearing significantly younger than stated age.  HEENT:  Pupils are equal, round, and reactive to light and accommodate.  Fundi not examined.  Oropharynx clear.  NECK:  Supple without mass, thyroid enlargement.  CHEST:  Clear P to A.  HEART:  Regular rate and rhythm without murmur, cardiac enlargement.  BREASTS:  Bilateral reduction mammoplasty with excellent cosmetic result  without mass, nodes, or nipple discharge.  ABDOMEN:  Soft, scaphoid without mass or organomegaly.  PELVIC:  External genitalia.  Normal female.  Vagina clean, but with severe  pelvic organ prolapse with  grade IV prolapse of anterior vaginal wall.  She  has severe fascial detachment of the posterior vaginal wall with grade III  rectocele/enterocele.  She has diastasis of the levator ani group and  bulging of her bladder completely through the introitus even in supine  position with minimal straining.  Rectovaginal examination confirms.  EXTREMITIES:  Negative.  NEUROLOGIC:  Physiologic.   IMPRESSION:  1.  Grade IV cystocele, grade III rectocele and enterocele with severe      fascial detachment, vaginal vault prolapse.  2.  History of mitral valve prolapse, need prophylaxis.  3.  Dyslipidemia.  4.  Hypertension.  5.  Irritable bowel syndrome.  6.  Chronic constipation.   PLAN:  Anterior, posterior, and enterocele repairs, colposuspension, partial  colpocleisis.       ___________________________________________  Gretta Cool, M.D.    CWL/MEDQ  D:   09/21/2004  T:  09/21/2004  Job:  [id number]   cc:   Donia Guiles, M.D.  [address] [place]  [place] [address]  Fax: [phone]

## 2011-01-26 NOTE — Op Note (Signed)
[place]  Patient:    [name], [name] Visit Number: [id number] MRN: [id number]          Service Type: DSU Location: [facility] Attending Physician:  Consuello Bossier Dictated by:   Consuello Bossier., M.D. Proc. Date: 02/25/02 Admit Date:  02/25/2002                             Operative Report  PREOPERATIVE DIAGNOSIS:  Symptomatic bilateral mammary hypertrophy with excessive breast tissue beneath the left breast.  POSTOPERATIVE DIAGNOSIS:  Symptomatic bilateral mammary hypertrophy with excessive breast tissue beneath the left breast.  OPERATION PERFORMED:  Bilateral reduction mammoplasty and excision of accessory breast tissue underneath the left breast.  SURGEON:  Consuello Bossier., M.D.  ASSISTANT:  Teena Irani. Odis Luster, M.D.  ANESTHESIA:  General endotracheal.  OPERATIVE FINDINGS:  The patient had symptomatic bilateral mammary hypertrophy with discomfort in the chest, upper shoulder and back area.  She also had an excessive amount of what appeared to be breast tissue beneath the left breast which interfered with her wearing bras and was irritating.  The above surgical procedure was carried out.  DESCRIPTION OF PROCEDURE:  The patient was brought to the operating room and given a general endotracheal anesthetic, prepped about the breast and the left upper abdomen in sterile fashion.  She had been marked in the upright position for the planned surgical procedure.  What appeared to be an excessive amount of accessory breast tissue beneath the left breast was excised in a transverse direction as an ellipse.  Approximately 84 gm of tissue were removed. Bleeding was controlled with the electrocautery unit.  The wound was closed first with subcutaneous #3-0 Vicryl followed by running subcuticular and #4-0 Monocryl producing a 15 cm in length transverse closure.  This wound was closed by the application of Steri-Strips.  Following  this, attention was then turned to the reduction mammoplasty.  The key hole area as well as inferior pedicle were de-epithelialized.  The inframammary incision was made and continued down until the pectoralis major fascia was encountered.  The dissection was continued upward at the level of the fascia to an area above the new nipple position.  An incision was made in the pedicle just above the level of the nipple and continued downward to the pectoralis major fascia and then it was continued upward leaving a 1 cm in depth superior pedicle.  A medial full thickness amount of breast tissue was removed as a triangular segment medially in continuity with the central segment as well as with the lateral larger segment.  538 gm of tissue were removed from the left breast and 84 gm of this accessory tissue for a total of 622 on the right breast, 688 for an amount in excess of 1300 grams of tissue being removed from both breasts.  The medial and lateral flaps were brought together to a predetermined position along the inframammary line with an interrupted #3-0 Vicryl.  The upper part of the vertical incision was also closed with a #3-0 Vicryl.  The circumareolar, vertical and inframammary incisions were closed with interrupted subcutaneous #3-0 Vicryl followed by running subcuticular #4-0 Monocryl.  Nipple color was excellent and the breast appeared to be symmetrical.  Steri-Strips, Xeroform, fluffs, ABD and a circumthoracic Ace bandage were then applied.  The patient tolerated the procedure well and was able to be discharged from the operating room to the recovery room  subsequently to be [facility] for [facility] observation. Dictated by:   Consuello Bossier., M.D. Attending Physician:  Consuello Bossier DD:  02/25/02 TD:  02/26/02 Job: [id number]

## 2011-01-26 NOTE — Op Note (Signed)
NAMESUELLA, COGAR                 ACCOUNT NO.:  [id number]   MEDICAL RECORD NO.:  [id number]          PATIENT TYPE:  OBV   LOCATION:  0442                         FACILITY:  [facility]   PHYSICIAN:  Gretta Cool, M.D. DATE OF BIRTH:  January 08, 1934   DATE OF PROCEDURE:  09/21/2004  DATE OF DISCHARGE:                                 OPERATIVE REPORT   PREOPERATIVE DIAGNOSES:  1.  Severe grade 4 pelvic organ prolapse with vaginal vault descensus, grade      4 cystocele rectocele enterocele.  2.  Severe fascial detachment.  3.  Levator diastasis.   POSTOPERATIVE DIAGNOSES:  1.  Severe grade 4 pelvic organ prolapse with vaginal vault descensus, grade      4 cystocele rectocele enterocele.  2.  Severe fascial detachment.  3.  Levator diastasis.   PROCEDURE:  Anterior posterior and enterocele repairs with uterosacral  cardinal colposuspension, partial colpocleisis.   SURGEON:  Gretta Cool, M.D.   ANESTHESIA:  General orotracheal.   DESCRIPTION OF PROCEDURE:  Under excellent general anesthesia with the  patient prepped and draped as a sterile field in [employer] stirrups with Foley  catheter draining her bladder, the apex of the vagina was grasped with Allis  clamps and then drawn through the introitus. An incision was made  transversely just above the previous closure hysterectomy and the scarring  that identified the site. The mucosa was then dissected from the pelvic  fascia from the base of the bladder all the way to the apex and to the  urethra. The mucosa was then dissected from the endopelvic fashion, and the  enormous cystocele plicated by interrupted sutures of 2-0 Vicryl. The  endopelvic fascia was then plicated with interrupted mattress sutures of 2-0  Vicryl from suburethral to the base of the bladder. Once the bladder was  completely mobilized, Kelly plication sutures were placed on either side of  the urethra and attached to the symphysis. One suture was placed on each  side and secured. The urethra was thus elevated to high intra-abdominal  position. At this point, the mucosa was trimmed, and the mucosa closed with  a subcuticular closure of 2-0 Vicryl. The upper layers of endopelvic fascia  were also secured to the endopelvic fascia as the mucosa was closed. At this  point, attention was turned to the posterior repair. The mucosa was grasped  with Allis clamps and a V-shaped incision made toward the anus. The mucosa  was then incised from the introitus to the apex of the vaginal cuff, and the  mucosa dissected from the perirectal fascia. An enormous fascial defect was  noted with detachment of the fascia approximately 5 to 6 cm in length. The  detachment slipped from the apex of the vagina to near the introitus. The  cardinal uterosacral colposuspension was then performed by securing the  uterosacral cardinal complex as high as possible with sutures of 2-0  Novofil. Sutures were then placed through the perirectal fascia and the  fascia pulled to the apex of the vaginal cuff. Interrupted sutures were then  placed so as to  secure anterior fascial closure of the anterior vaginal wall  to the posterior perirectal fascia all the way across with interrupted  sutures of 2-0 Novofil. At this point, an upper colpocleisis was performed  with closure of the bladder pillars and anterior vaginal fascia to the  posterior fascia and to the uterosacral cardinal colposuspension. A series  of plication sutures were placed so as to close the upper vagina and plicate  the fascia in circumferential fashion. At this point, a suture of 2-0 Vicryl  was used to plicate the levator fascia from the introitus to the perineal  body. The perineal body muscles were then repaired, and the mucosa then  trimmed and the mucosa closed with a subcuticular closure from the apex of  the vagina to the introitus. At the end of the procedure, the sponge and  laps were correct. There were no  complications. A banana suprapubic  cystocatheter was placed and secured with 4-0 nylon. At this point, the  procedure was terminated without complications. The patient returned to  recovery room in excellent condition.      CWL/MEDQ  D:  09/21/2004  T:  09/21/2004  Job:  [id number]   cc:   Donia Guiles, M.D.  [address] [place]  [place] [address]  Fax: [phone]

## 2011-01-26 NOTE — Assessment & Plan Note (Signed)
[facility]                              CARDIOLOGY OFFICE NOTE   NAME:Mccarthy, Lori E                        MRN:          [id number]  DATE:05/10/2006                            DOB:          1933/11/03    PROGRESS NOTE:  [name] returns today for further management of her  hypertension and palpitations. She has been having some dizziness, that  sounds like orthostatic hypotension.   MEDICATIONS:  Include Diltiazem 180 bid, Lisinopril/HCTZ 10/12.5.   PHYSICAL EXAMINATION:  VITAL SIGNS:  Blood pressure today is 113/67 lying  down with a heart rate of 67. Standing is 122/78 with a heart rate of 80  with slight dizziness and then after standing for 5 minutes, was 129/78 and  83. Weight is 151.  GENERAL:  No acute distress.  NECK:  Carotids are equal bilaterally without bruits. No jugular venous  distention. Thyroid is not enlarged. Trachea midline.  LUNGS:  Regular rate and rhythm.  ABDOMEN:  Soft.  EXTREMITIES:  No clubbing, cyanosis, or edema. Pulses are intact. She has  varicose veins.   LABORATORY DATA:  EKG today shows normal sinus rhythm with ST segment  changes, which are stable.   ASSESSMENT/PLAN:  I have had a long talk with [name]. I think she is  doing well. I have changed her to Diltiazem extended release 240 once daily.  Hopefully, this will help prevent some of the dizziness, though she is not  clearly orthostatic today. She has been under a lot of stress, which may be  contributing to this. I told her also to stay well hydrated. I will plan on  seeing her back in a year.                               Thomas C. Daleen Squibb, MD, [facility]    TCW/MedQ  DD:  05/10/2006  DT:  05/11/2006  Job #:  [id number]   cc:   Donia Guiles, MD  Gretta Cool, MD

## 2011-01-29 ENCOUNTER — Telehealth: Payer: Self-pay | Admitting: Cardiology

## 2011-01-29 NOTE — Telephone Encounter (Signed)
All Cardiac faxed to [facility] @ [phone]  01/29/11/km

## 2011-02-02 ENCOUNTER — Telehealth: Payer: Self-pay | Admitting: Cardiology

## 2011-02-02 NOTE — Telephone Encounter (Signed)
Dr. Daleen Squibb reviewed pt medication list and recommended pt hold diltiazem and follow-up appt with him on 02/07/11. I spoke with pt and she will keep a bp log & bring in to her 2pm appt 02/07/11 along with her bp cuff to Dr. Daleen Squibb.  She has not taken diltiazem since Wednesday night.  She will call back if she has any problems prior to appt. Medication list reviewed and updated with pt. Mylo Red RN

## 2011-02-02 NOTE — Telephone Encounter (Signed)
Pt states something is wrong with her blood pressure med. Pt states she been feeling weak and heart rate drop to 31. Pt wants to talk to the nurse.

## 2011-02-02 NOTE — Telephone Encounter (Signed)
Pt was at the surgical center yesterday for a spinal injection.  Pt says it was noted her heart rate to be 31 with a bp that was okay according to pt.   Pt has been feeling weak and tired lately since lumbar  surgery  In December.  Her vital signs at home last pm were:  120/65 hr 59                                                                                     Today:      157/91 hr 56  * no diltiazem taken yet She would like Dr. Daleen Squibb to review her medications.   Mylo Red RN

## 2011-02-06 ENCOUNTER — Encounter: Payer: Self-pay | Admitting: Cardiology

## 2011-02-07 ENCOUNTER — Ambulatory Visit (INDEPENDENT_AMBULATORY_CARE_PROVIDER_SITE_OTHER): Payer: Medicare Other | Admitting: Cardiology

## 2011-02-07 ENCOUNTER — Encounter: Payer: Self-pay | Admitting: Cardiology

## 2011-02-07 VITALS — BP 140/78 | HR 92 | Ht 66.0 in | Wt 140.0 lb

## 2011-02-07 DIAGNOSIS — R001 Bradycardia, unspecified: Secondary | ICD-10-CM

## 2011-02-07 DIAGNOSIS — I498 Other specified cardiac arrhythmias: Secondary | ICD-10-CM

## 2011-02-07 DIAGNOSIS — I493 Ventricular premature depolarization: Secondary | ICD-10-CM

## 2011-02-07 DIAGNOSIS — I059 Rheumatic mitral valve disease, unspecified: Secondary | ICD-10-CM

## 2011-02-07 DIAGNOSIS — I4949 Other premature depolarization: Secondary | ICD-10-CM

## 2011-02-07 DIAGNOSIS — R002 Palpitations: Secondary | ICD-10-CM

## 2011-02-07 DIAGNOSIS — I341 Nonrheumatic mitral (valve) prolapse: Secondary | ICD-10-CM

## 2011-02-07 MED ORDER — LISINOPRIL-HYDROCHLOROTHIAZIDE 10-12.5 MG PO TABS: 1.0000 | ORAL_TABLET | Freq: Every day | ORAL | Status: DC

## 2011-02-07 MED ORDER — DILTIAZEM HCL ER 240 MG PO CP24: 240.0000 mg | ORAL_CAPSULE | Freq: Every day | ORAL | Status: DC

## 2011-02-07 NOTE — Progress Notes (Signed)
Addended by: Mylo Red F on: 02/07/2011 02:26 PM   Modules accepted: Orders

## 2011-02-07 NOTE — Assessment & Plan Note (Signed)
She is quite symptomatic with her PVCs. These had increased significantly since stopping the diltiazem. We will put her back on her original dose for both her hypertension and for the palpitations and obtain a 24-hour Holter monitor to rule out any significant bradycardia. I suspect the event she had above during spinal injection was a vagal event. We'll also obtain a 2-D echocardiogram to reassess her LV function.

## 2011-02-07 NOTE — Progress Notes (Signed)
HPI   [name] returns today for careful followup of her bradycardic event in the 30s while having lumbar injection. [name] She was asymptomatic. She did not have any anesthesia or any other sedation. She called the office and we discontinued her diltiazem. He been on 240 mg a day.  Since then she has been having a lot of palpitations which she had before. She has a history of mitral prolapse. Last echocardiogram was in 2009. At that time showed normal left ventricular function and mild mitral regurgitation.  He was also taken off of lorazepam by her primary care in January. She had been taking this for years. She's been very anxious and unable to sleep.  Her EKG today shows sinus rhythm with unifocal PVCs.  Past Medical History  Diagnosis Date  . Atrial enlargement, left   . Mitral regurgitation   . Mitral valve prolapse   . Palpitations     Past Surgical History  Procedure Date  . Anterior posterior and enterocele repairs 09/21/2004    With uterosacral cardinal colposuspension, partial colpocleisis; Lori Cool, MD  . Breast enhancement surgery 02/25/2002    Bilateral reduction and excision of accessory breast tissue underneath the left breast; [name]., MD    Family History  Problem Relation Age of Onset  . Heart failure Father     History   Social History  . Marital Status: Married    Spouse Name: N/A    Number of Children: N/A  . Years of Education: N/A   Occupational History  . Retired    Social History Main Topics  . Smoking status: Never Smoker   . Smokeless tobacco: Not on file  . Alcohol Use: Yes     one maybe every 3 mths  . Drug Use: No  . Sexually Active: Not on file   Other Topics Concern  . Not on file   Social History Narrative   MarriedRegular exercise    Allergies  Allergen Reactions  . Codeine   . Cortisone   . Sulfonamide Derivatives     Current Outpatient Prescriptions  Medication Sig Dispense Refill  . fenofibrate  (TRICOR) 145 MG tablet Take 145 mg by mouth daily.        . fish oil-omega-3 fatty acids 1000 MG capsule Take 1 g by mouth daily.        [name] lisinopril-hydrochlorothiazide (PRINZIDE,ZESTORETIC) 10-12.5 MG per tablet Take 1 tablet by mouth daily.        . pantoprazole (PROTONIX) 40 MG tablet Take 40 mg by mouth daily.        . traMADol (ULTRAM) 50 MG tablet Take 50 mg by mouth every 6 (six) hours as needed.        [name] DISCONTD: calcium-vitamin D (OSCAL WITH D) 500-200 MG-UNIT per tablet Take 1 tablet by mouth daily.        [name] DISCONTD: citalopram (CELEXA) 10 MG tablet Take 10 mg by mouth daily.        [name] DISCONTD: diltiazem (DILACOR XR) 240 MG 24 hr capsule Take 240 mg by mouth daily. On hold now       . DISCONTD: hyoscyamine (LEVSIN, ANASPAZ) 0.125 MG tablet Take 0.125 mg by mouth as needed.        [name] DISCONTD: LORazepam (ATIVAN) 1 MG tablet Take 1 mg by mouth as needed.          ROS Negative other than HPI.   PE General Appearance: well developed, well nourished in no  acute distress, anxious HEENT: symmetrical face, PERRLA, good dentition  Neck: no JVD, thyromegaly, or adenopathy, trachea midline Chest: symmetric without deformity Cardiac: PMI non-displaced, RRR, normal S1, S2, no gallop, soft systolic murmur Lung: clear to ausculation and percussion Vascular: all pulses full without bruits  Abdominal: nondistended, nontender, good bowel sounds, no HSM, no bruits Extremities: no cyanosis, clubbing or edema, no sign of DVT, no varicosities  Skin: normal color, no rashes Neuro: alert and oriented x 3, non-focal Pysch: normal affect  Filed Vitals:   02/07/11 1343  BP: 140/78  Pulse: 92  Height: 5\' 6"  (1.676 m)  Weight: 140 lb (63.504 kg)    EKG  Labs and Studies Reviewed.   Lab Results  Component Value Date   WBC 11.1* 08/15/2010   HGB 11.5* 08/15/2010   HCT 33.9* 08/15/2010   MCV 93.6 08/15/2010   PLT 292 08/15/2010      Chemistry      Component Value Date/Time   NA 139  08/15/2010 1137   K 3.7 08/15/2010 1137   CL 105 08/15/2010 1137   CO2 27 08/15/2010 1137   BUN 10 08/15/2010 1137   CREATININE 0.73 08/15/2010 1137      Component Value Date/Time   CALCIUM 8.7 08/15/2010 1137       No results found for this basename: CHOL   No results found for this basename: HDL   No results found for this basename: LDLCALC   No results found for this basename: TRIG   No results found for this basename: CHOLHDL   No results found for this basename: HGBA1C   No results found for this basename: ALT, AST, GGT, ALKPHOS, BILITOT   No results found for this basename: TSH

## 2011-02-07 NOTE — Patient Instructions (Addendum)
Your physician has requested that you have an echocardiogram. Echocardiography is a painless test that uses sound waves to create images of your heart. It provides your doctor with information about the size and shape of your heart and how well your heart's chambers and valves are working. This procedure takes approximately one hour. There are no restrictions for this procedure.  Your physician has recommended that you wear a 24 hour holter monitor. Holter monitors are medical devices that record the heart's electrical activity. Doctors most often use these monitors to diagnose arrhythmias. Arrhythmias are problems with the speed or rhythm of the heartbeat. The monitor is a small, portable device. You can wear one while you do your normal daily activities. This is usually used     Your physician recommends that you schedule a follow-up appointment in: 1 year with Dr. Daleen Squibb

## 2011-02-12 ENCOUNTER — Ambulatory Visit (INDEPENDENT_AMBULATORY_CARE_PROVIDER_SITE_OTHER): Payer: Medicare Other | Admitting: Internal Medicine

## 2011-02-12 ENCOUNTER — Encounter: Payer: Self-pay | Admitting: Internal Medicine

## 2011-02-12 DIAGNOSIS — E785 Hyperlipidemia, unspecified: Secondary | ICD-10-CM

## 2011-02-12 DIAGNOSIS — F32A Depression, unspecified: Secondary | ICD-10-CM

## 2011-02-12 DIAGNOSIS — Z79899 Other long term (current) drug therapy: Secondary | ICD-10-CM

## 2011-02-12 DIAGNOSIS — F329 Major depressive disorder, single episode, unspecified: Secondary | ICD-10-CM

## 2011-02-12 DIAGNOSIS — D649 Anemia, unspecified: Secondary | ICD-10-CM

## 2011-02-12 LAB — BASIC METABOLIC PANEL
GFR: 78.8 mL/min (ref >60.00)
Glucose, Bld: 123 mg/dL — ABNORMAL HIGH (ref 70–99)
Potassium: 4.2 mEq/L (ref 3.5–5.1)
Sodium: 135 mEq/L (ref 135–145)

## 2011-02-12 LAB — LIPID PANEL
Cholesterol: 199 mg/dL (ref 0–200)
HDL: 73.6 mg/dL (ref >39.00)
Triglycerides: 107 mg/dL (ref 0.0–149.0)
VLDL: 21.4 mg/dL (ref 0.0–40.0)

## 2011-02-12 LAB — CBC WITH DIFFERENTIAL/PLATELET
Basophils Absolute: 0 10*3/uL (ref 0.0–0.1)
Hemoglobin: 14.6 g/dL (ref 12.0–15.0)
Lymphocytes Relative: 23.2 % (ref 12.0–46.0)
Monocytes Relative: 6.8 % (ref 3.0–12.0)
Neutro Abs: 6 10*3/uL (ref 1.4–7.7)
RBC: 4.51 Mil/uL (ref 3.87–5.11)
RDW: 15.2 % — ABNORMAL HIGH (ref 11.5–14.6)

## 2011-02-12 LAB — VITAMIN B12: Vitamin B-12: 295 pg/mL (ref 211–911)

## 2011-02-12 LAB — HEPATIC FUNCTION PANEL
ALT: 16 U/L (ref 0–35)
Albumin: 4.7 g/dL (ref 3.5–5.2)
Total Bilirubin: 0.6 mg/dL (ref 0.3–1.2)
Total Protein: 7.2 g/dL (ref 6.0–8.3)

## 2011-02-12 MED ORDER — CITALOPRAM HYDROBROMIDE 20 MG PO TABS: 20.0000 mg | ORAL_TABLET | Freq: Every day | ORAL | Status: DC

## 2011-02-16 ENCOUNTER — Ambulatory Visit: Payer: Medicare Other | Admitting: Cardiology

## 2011-02-19 ENCOUNTER — Encounter (INDEPENDENT_AMBULATORY_CARE_PROVIDER_SITE_OTHER): Payer: Medicare Other

## 2011-02-19 ENCOUNTER — Ambulatory Visit (HOSPITAL_COMMUNITY): Payer: Medicare Other | Attending: Cardiology | Admitting: Radiology

## 2011-02-19 DIAGNOSIS — R001 Bradycardia, unspecified: Secondary | ICD-10-CM

## 2011-02-19 DIAGNOSIS — I493 Ventricular premature depolarization: Secondary | ICD-10-CM

## 2011-02-19 DIAGNOSIS — I517 Cardiomegaly: Secondary | ICD-10-CM

## 2011-02-19 DIAGNOSIS — I079 Rheumatic tricuspid valve disease, unspecified: Secondary | ICD-10-CM | POA: Insufficient documentation

## 2011-02-19 DIAGNOSIS — D649 Anemia, unspecified: Secondary | ICD-10-CM | POA: Insufficient documentation

## 2011-02-19 DIAGNOSIS — R002 Palpitations: Secondary | ICD-10-CM

## 2011-02-19 DIAGNOSIS — I341 Nonrheumatic mitral (valve) prolapse: Secondary | ICD-10-CM

## 2011-02-19 DIAGNOSIS — I059 Rheumatic mitral valve disease, unspecified: Secondary | ICD-10-CM | POA: Insufficient documentation

## 2011-02-19 DIAGNOSIS — F32A Depression, unspecified: Secondary | ICD-10-CM | POA: Insufficient documentation

## 2011-02-19 DIAGNOSIS — E785 Hyperlipidemia, unspecified: Secondary | ICD-10-CM | POA: Insufficient documentation

## 2011-02-19 DIAGNOSIS — F329 Major depressive disorder, single episode, unspecified: Secondary | ICD-10-CM | POA: Insufficient documentation

## 2011-02-19 DIAGNOSIS — I10 Essential (primary) hypertension: Secondary | ICD-10-CM | POA: Insufficient documentation

## 2011-02-19 NOTE — Assessment & Plan Note (Signed)
Begin celexa 20mg  po qd. Followup 4 wks or sooner if needed.

## 2011-02-19 NOTE — Progress Notes (Signed)
  Subjective:    Patient ID: [name], female    DOB: 1934-01-25, 77 y.o.   MRN: [id number]  HPI Pt presents to clinic to establish care and for followup of medical problems. H/o palpitations and bradycardia s/p cardiology evaluation and denies palpitations, dizziness, presyncope or syncope. Notes h/o mild depression previously tx'ed with celexa. Has intermittent crying spells. H/o insomnia and has only taken ambien twice since surgery. Reviewed h/o anemia with hgb 11.4 down from 14 baseline. No gross active bleeding. H/o hyperlipidemia and states cannot tolerate statins or welchol. No other complaints.  Reviewed pmh, psh, medications, allergies, fam hx and soc hx.    Review of Systems  Cardiovascular: Negative for chest pain and palpitations.  Psychiatric/Behavioral: Positive for dysphoric mood. Negative for suicidal ideas.  All other systems reviewed and are negative.       Objective:   Physical Exam  Nursing note and vitals reviewed. Constitutional: She appears well-developed and well-nourished. No distress.  HENT:  Head: Normocephalic and atraumatic.  Right Ear: External ear normal.  Left Ear: External ear normal.  Eyes: Conjunctivae are normal. Right eye exhibits no discharge. Left eye exhibits no discharge. No scleral icterus.  Neck: Neck supple. No JVD present.  Cardiovascular: Normal rate.  Exam reveals no gallop and no friction rub.   No murmur heard. Pulmonary/Chest: Effort normal and breath sounds normal. No respiratory distress. She has no wheezes. She has no rales.  Lymphadenopathy:    She has no cervical adenopathy.  Neurological: She is alert.  Skin: Skin is warm and dry. She is not diaphoretic.  Psychiatric: She has a normal mood and affect.          Assessment & Plan:

## 2011-02-19 NOTE — Assessment & Plan Note (Signed)
Obtain flp/lft.  

## 2011-02-19 NOTE — Assessment & Plan Note (Signed)
Asx without gross active bleeding. Obtain CBC, b12 and iron level.

## 2011-02-20 ENCOUNTER — Telehealth: Payer: Self-pay

## 2011-02-20 NOTE — Telephone Encounter (Signed)
Pt notified and letter sent per pt request

## 2011-02-20 NOTE — Telephone Encounter (Signed)
Message copied by Beverely Low on Tue Feb 20, 2011 10:28 AM ------      Message from: Staci Righter      Created: Mon Feb 19, 2011  8:47 PM       Blood sugar mildly elevated. Low sugar/carb diet and exercise. Other labs ok

## 2011-02-27 ENCOUNTER — Telehealth: Payer: Self-pay | Admitting: *Deleted

## 2011-02-27 ENCOUNTER — Other Ambulatory Visit: Payer: Self-pay | Admitting: Gynecology

## 2011-02-27 DIAGNOSIS — Z1231 Encounter for screening mammogram for malignant neoplasm of breast: Secondary | ICD-10-CM

## 2011-02-27 NOTE — Telephone Encounter (Signed)
I spoke with pt about monitor and echo results.  She does have the palpitations correlating with lunchtime and bed time.  She says she has fewer at this time.  She did not want to switch from Cardizem to Metoprolol Succinate 50mg  qd at this time.  She remembers being on Lopressor "several years ago and it made my hair fall out".  Pt will call back if she is having further problems. Mylo Red RN

## 2011-03-05 ENCOUNTER — Telehealth: Payer: Self-pay | Admitting: Cardiology

## 2011-03-05 MED ORDER — DILTIAZEM HCL ER 240 MG PO CP24: 240.0000 mg | ORAL_CAPSULE | Freq: Every day | ORAL | Status: DC

## 2011-03-05 NOTE — Telephone Encounter (Signed)
Refill medication diltiazem 240 mg. 90 day supply with 3 refill. [facility] on [address] [phone]

## 2011-03-06 ENCOUNTER — Telehealth: Payer: Self-pay | Admitting: Cardiology

## 2011-03-06 MED ORDER — DILTIAZEM HCL ER 240 MG PO CP24: 240.0000 mg | ORAL_CAPSULE | Freq: Every day | ORAL | Status: DC

## 2011-03-06 NOTE — Telephone Encounter (Signed)
PT NEEDS DILTIAZEM 90 DAYS SUPPLY TO BE CALLED IN [facility] [phone]

## 2011-03-19 ENCOUNTER — Ambulatory Visit: Payer: Medicare Other | Admitting: Internal Medicine

## 2011-03-23 ENCOUNTER — Ambulatory Visit: Payer: Medicare Other | Admitting: Internal Medicine

## 2011-03-30 ENCOUNTER — Ambulatory Visit (INDEPENDENT_AMBULATORY_CARE_PROVIDER_SITE_OTHER): Payer: Medicare Other | Admitting: Family Medicine

## 2011-03-30 ENCOUNTER — Encounter: Payer: Self-pay | Admitting: Family Medicine

## 2011-03-30 DIAGNOSIS — E785 Hyperlipidemia, unspecified: Secondary | ICD-10-CM

## 2011-03-30 DIAGNOSIS — R7309 Other abnormal glucose: Secondary | ICD-10-CM

## 2011-03-30 DIAGNOSIS — F5104 Psychophysiologic insomnia: Secondary | ICD-10-CM

## 2011-03-30 DIAGNOSIS — F329 Major depressive disorder, single episode, unspecified: Secondary | ICD-10-CM

## 2011-03-30 DIAGNOSIS — R7303 Prediabetes: Secondary | ICD-10-CM

## 2011-03-30 DIAGNOSIS — F32A Depression, unspecified: Secondary | ICD-10-CM

## 2011-03-30 DIAGNOSIS — G4700 Insomnia, unspecified: Secondary | ICD-10-CM

## 2011-03-30 MED ORDER — FLUTICASONE PROPIONATE 50 MCG/ACT NA SUSP: 2.0000 | Freq: Every day | NASAL | Status: DC

## 2011-03-30 MED ORDER — ZOLPIDEM TARTRATE 5 MG PO TABS: 5.0000 mg | ORAL_TABLET | Freq: Every evening | ORAL | Status: DC | PRN

## 2011-03-30 MED ORDER — GLUCOSE BLOOD VI STRP: ORAL_STRIP | Status: DC

## 2011-03-30 MED ORDER — CITALOPRAM HYDROBROMIDE 20 MG PO TABS: 20.0000 mg | ORAL_TABLET | Freq: Every day | ORAL | Status: DC

## 2011-03-30 MED ORDER — FENOFIBRATE 145 MG PO TABS: 145.0000 mg | ORAL_TABLET | Freq: Every day | ORAL | Status: DC

## 2011-03-30 MED ORDER — LANCETS 30G MISC: Status: DC

## 2011-03-30 NOTE — Progress Notes (Signed)
  Subjective:    Patient ID: [name], female    DOB: [birth year], 77 y.o.   MRN: [id number]  HPI Patient transferring care to me. Past medical history reviewed. History of hypertension, depression, hyperlipidemia, and mild anemia. Recent diagnosis prediabetes by recent labs. She has chronic insomnia on Ambien 5 mg at night as needed and requesting refills. No regular alcohol use. Doesn't drink caffeine much.  Lipids recently assessed. Needs refills TriCor. No side effects. She has some rhinitis symptoms and uses Flonase for that. Requesting refills. Recently had significant back surgery. Limited exercise. Has made some dietary changes since recent elevated blood sugar. Occasional thirst and urine frequency. Blood sugar never in diabetic range  Mood has improved with Celexa. No side effects from medication  Past Medical History  Diagnosis Date  . Atrial enlargement, left   . Mitral regurgitation   . Mitral valve prolapse   . Palpitations   . Arthritis   . Depression   . GERD (gastroesophageal reflux disease)   . Allergy   . Heart murmur   . Hyperlipidemia   . Hypertension   . Positive TB test    Past Surgical History  Procedure Date  . Anterior posterior and enterocele repairs 09/21/2004    With uterosacral cardinal colposuspension, partial colpocleisis; Gretta Cool, MD  . Breast enhancement surgery 02/25/2002    Bilateral reduction and excision of accessory breast tissue underneath the left breast; Consuello Bossier., MD  . Tonsillectomy and adenoidectomy   . Breast surgery   . Abdominal hysterectomy   . Cataract extraction     reports that she has never smoked. She does not have any smokeless tobacco history on file. She reports that she drinks alcohol. She reports that she does not use illicit drugs. family history includes Arthritis in her father and mother; Heart disease in her father; Heart failure in her father; Hyperlipidemia in her father and mother; and  Hypertension in her mother. Allergies  Allergen Reactions  . Codeine   . Cortisone   . Sulfonamide Derivatives       Review of Systems  Constitutional: Negative for fever, chills, appetite change and unexpected weight change.  Respiratory: Negative for shortness of breath.   Cardiovascular: Negative for chest pain and palpitations.  Neurological: Negative for dizziness.  Psychiatric/Behavioral: Negative for confusion and dysphoric mood.       Objective:   Physical Exam  Constitutional: She is oriented to person, place, and time. She appears well-developed and well-nourished.  HENT:  Mouth/Throat: Oropharynx is clear and moist.  Neck: Neck supple. No thyromegaly present.  Cardiovascular: Normal rate, regular rhythm and normal heart sounds.   Pulmonary/Chest: Effort normal and breath sounds normal. No respiratory distress. She has no wheezes. She has no rales.  Musculoskeletal: She exhibits no edema.  Lymphadenopathy:    She has no cervical adenopathy.  Neurological: She is alert and oriented to person, place, and time.  Psychiatric: She has a normal mood and affect. Her behavior is normal.          Assessment & Plan:  #1 depression improved on Celexa. Continue current medication  #2 dyslipidemia. Refill TriCor. Recent labs reviewed  #3 chronic insomnia. Sleep hygiene discussed. Ambien 5 mg at night refilled  #4 history of chronic rhinitis. Refilled Flonase #5 prediabetes. Diet discussed. Home glucose monitor given. Monitor once or twice weekly. Routine follow up in 4 months and review readings that time. Discussed importance of exercise and weight control

## 2011-03-31 DIAGNOSIS — F5104 Psychophysiologic insomnia: Secondary | ICD-10-CM | POA: Insufficient documentation

## 2011-04-06 ENCOUNTER — Ambulatory Visit
Admission: RE | Admit: 2011-04-06 | Discharge: 2011-04-06 | Disposition: A | Discharge status: Home / Self Care | Payer: Medicare Other | Source: Ambulatory Visit | Attending: Gynecology | Admitting: Gynecology

## 2011-04-06 DIAGNOSIS — Z1231 Encounter for screening mammogram for malignant neoplasm of breast: Secondary | ICD-10-CM

## 2011-07-19 ENCOUNTER — Other Ambulatory Visit: Payer: Self-pay | Admitting: Family Medicine

## 2011-07-20 NOTE — Telephone Encounter (Signed)
Refill one month. Schedule follow up.

## 2011-07-20 NOTE — Telephone Encounter (Signed)
Last filled 03-30-2011, #30 with 1 refill 1 at [facility] pr[facility]

## 2011-07-23 ENCOUNTER — Telehealth: Payer: Self-pay | Admitting: Family Medicine

## 2011-07-23 MED ORDER — ZOLPIDEM TARTRATE 5 MG PO TABS: 5.0000 mg | ORAL_TABLET | Freq: Every evening | ORAL | Status: DC | PRN

## 2011-07-23 NOTE — Telephone Encounter (Signed)
#  30 refill one

## 2011-07-23 NOTE — Telephone Encounter (Signed)
Pt requesting a refill on zolpidem (AMBIEN) 5 MG tablet  Pt son is having cancer treatment and it is wearing on pt she is having a hard time sleeping at night. Please contact pt

## 2011-07-23 NOTE — Telephone Encounter (Signed)
A Dr Caryl Never pt, he is out of office this week.  Ambien last filled at [facility] on 03/30/11, #30 with 1 refill. Please advise

## 2011-08-01 ENCOUNTER — Encounter: Payer: Self-pay | Admitting: Family Medicine

## 2011-08-01 ENCOUNTER — Ambulatory Visit (INDEPENDENT_AMBULATORY_CARE_PROVIDER_SITE_OTHER): Payer: Medicare Other | Admitting: Family Medicine

## 2011-08-01 DIAGNOSIS — F32A Depression, unspecified: Secondary | ICD-10-CM

## 2011-08-01 DIAGNOSIS — R7303 Prediabetes: Secondary | ICD-10-CM

## 2011-08-01 DIAGNOSIS — R7309 Other abnormal glucose: Secondary | ICD-10-CM

## 2011-08-01 DIAGNOSIS — E785 Hyperlipidemia, unspecified: Secondary | ICD-10-CM

## 2011-08-01 DIAGNOSIS — F329 Major depressive disorder, single episode, unspecified: Secondary | ICD-10-CM

## 2011-08-01 DIAGNOSIS — I10 Essential (primary) hypertension: Secondary | ICD-10-CM

## 2011-08-01 LAB — LIPID PANEL
HDL: 55.2 mg/dL (ref >39.00)
LDL Cholesterol: 110 mg/dL — ABNORMAL HIGH (ref 0–99)
Total CHOL/HDL Ratio: 3
Triglycerides: 113 mg/dL (ref 0.0–149.0)
VLDL: 22.6 mg/dL (ref 0.0–40.0)

## 2011-08-01 LAB — HEPATIC FUNCTION PANEL
Albumin: 4.3 g/dL (ref 3.5–5.2)
Total Bilirubin: 0.5 mg/dL (ref 0.3–1.2)

## 2011-08-01 NOTE — Progress Notes (Signed)
  Subjective:    Patient ID: [name], female    DOB: 12-23-1933, 77 y.o.   MRN: [id number]  HPI  Medical followup. Patient has history of hypertension, depression, hyperlipidemia, and prediabetes. She has chronic insomnia and recently had some exacerbation. Son recently had recurrence of tongue cancer and required extensive surgery. He lives with family. The patient takes Ambien 5 mg at night which is helping. Dyslipidemia treated with TriCor. No side effects to medication. History of prediabetes. Given glucose monitor last visit. Fasting blood sugars consistently less than 120. Patient has no symptoms of hyperglycemia. Denied chest pains or dyspnea.  History of depression. She had tapered back to Celexa to 10 mg daily but recently increased this to 20 mg with son's illness. Currently mood is stable.  Past Medical History  Diagnosis Date  . Atrial enlargement, left   . Mitral regurgitation   . Mitral valve prolapse   . Palpitations   . Arthritis   . Depression   . GERD (gastroesophageal reflux disease)   . Allergy   . Heart murmur   . Hyperlipidemia   . Hypertension   . Positive TB test    Past Surgical History  Procedure Date  . Anterior posterior and enterocele repairs 09/21/2004    With uterosacral cardinal colposuspension, partial colpocleisis; Gretta Cool, MD  . Breast enhancement surgery 02/25/2002    Bilateral reduction and excision of accessory breast tissue underneath the left breast; Consuello Bossier., MD  . Tonsillectomy and adenoidectomy   . Breast surgery   . Abdominal hysterectomy   . Cataract extraction     reports that she has never smoked. She does not have any smokeless tobacco history on file. She reports that she drinks alcohol. She reports that she does not use illicit drugs. family history includes Arthritis in her father and mother; Heart disease in her father; Heart failure in her father; Hyperlipidemia in her father and mother; and  Hypertension in her mother. Allergies  Allergen Reactions  . Codeine   . Cortisone   . Sulfonamide Derivatives       Review of Systems  Constitutional: Negative for appetite change and unexpected weight change.  Respiratory: Negative for cough and shortness of breath.   Cardiovascular: Negative for chest pain and palpitations.  Gastrointestinal: Negative for abdominal pain.  Genitourinary: Negative for dysuria.  Neurological: Negative for dizziness, syncope and headaches.       Objective:   Physical Exam  Constitutional: She is oriented to person, place, and time. She appears well-developed and well-nourished. No distress.  HENT:  Right Ear: External ear normal.  Left Ear: External ear normal.  Mouth/Throat: Oropharynx is clear and moist.  Neck: Neck supple. No thyromegaly present.  Cardiovascular: Normal rate and regular rhythm.   Pulmonary/Chest: Effort normal and breath sounds normal. No respiratory distress. She has no wheezes. She has no rales.  Musculoskeletal: She exhibits no edema.  Lymphadenopathy:    She has no cervical adenopathy.  Neurological: She is alert and oriented to person, place, and time.          Assessment & Plan:  #1 dyslipidemia. Recheck lipid and hepatic panel #2 chronic insomnia. Continue Ambien 5 mg at night as needed  #3 hypertension stable. Continue current medications.  #4 prediabetes. Plan fasting blood sugar but patient was not fasting today. Continue home monitoring. Establish more consistent exercise. Routine followup 4 months.

## 2011-08-03 NOTE — Progress Notes (Signed)
Quick Note:  Left a message for pt to return call. ______ 

## 2011-08-06 NOTE — Progress Notes (Signed)
Quick Note:  Pt aware ______ 

## 2011-08-21 ENCOUNTER — Other Ambulatory Visit: Payer: Self-pay | Admitting: Internal Medicine

## 2011-09-12 ENCOUNTER — Ambulatory Visit: Payer: Medicare Other | Admitting: Cardiology

## 2011-09-18 ENCOUNTER — Other Ambulatory Visit: Payer: Self-pay | Admitting: Gastroenterology

## 2011-09-18 ENCOUNTER — Encounter: Payer: Self-pay | Admitting: Cardiology

## 2011-09-18 DIAGNOSIS — R109 Unspecified abdominal pain: Secondary | ICD-10-CM

## 2011-09-18 DIAGNOSIS — D1800 Hemangioma unspecified site: Secondary | ICD-10-CM

## 2011-09-24 ENCOUNTER — Ambulatory Visit (INDEPENDENT_AMBULATORY_CARE_PROVIDER_SITE_OTHER): Payer: Medicare Other | Admitting: Family Medicine

## 2011-09-24 ENCOUNTER — Encounter: Payer: Self-pay | Admitting: Family Medicine

## 2011-09-24 VITALS — BP 120/80 | HR 84 | Temp 97.6°F | Wt 150.0 lb

## 2011-09-24 DIAGNOSIS — J329 Chronic sinusitis, unspecified: Secondary | ICD-10-CM

## 2011-09-24 MED ORDER — AMOXICILLIN 875 MG PO TABS: 875.0000 mg | ORAL_TABLET | Freq: Two times a day (BID) | ORAL | Status: AC

## 2011-09-24 MED ORDER — ZOLPIDEM TARTRATE 5 MG PO TABS: 5.0000 mg | ORAL_TABLET | Freq: Every evening | ORAL | Status: DC | PRN

## 2011-09-24 NOTE — Patient Instructions (Signed)
Sinusitis Sinuses are air pockets within the bones of your face. The growth of bacteria within a sinus leads to infection. The infection prevents the sinuses from draining. This infection is called sinusitis. SYMPTOMS  There will be different areas of pain depending on which sinuses have become infected.  The maxillary sinuses often produce pain beneath the eyes.   Frontal sinusitis may cause pain in the middle of the forehead and above the eyes.  Other problems (symptoms) include:  Toothaches.   Colored, pus-like (purulent) drainage from the nose.   Swelling, warmth, and tenderness over the sinus areas may be signs of infection.  TREATMENT  Sinusitis is most often determined by an exam.X-rays may be taken. If x-rays have been taken, make sure you obtain your results or find out how you are to obtain them. Your caregiver may give you medications (antibiotics). These are medications that will help kill the bacteria causing the infection. You may also be given a medication (decongestant) that helps to reduce sinus swelling.  HOME CARE INSTRUCTIONS   Only take over-the-counter or prescription medicines for pain, discomfort, or fever as directed by your caregiver.   Drink extra fluids. Fluids help thin the mucus so your sinuses can drain more easily.   Applying either moist heat or ice packs to the sinus areas may help relieve discomfort.   Use saline nasal sprays to help moisten your sinuses. The sprays can be found at your local drugstore.  SEEK IMMEDIATE MEDICAL CARE IF:  You have a fever.   You have increasing pain, severe headaches, or toothache.   You have nausea, vomiting, or drowsiness.   You develop unusual swelling around the face or trouble seeing.  MAKE SURE YOU:   Understand these instructions.   Will watch your condition.   Will get help right away if you are not doing well or get worse.  Document Released: 08/27/2005 Document Revised: 05/09/2011 Document Reviewed:  03/26/2007 [facility] Patient Information 2012 [place], [place].  Try Delsym cough syrup for cough

## 2011-09-24 NOTE — Progress Notes (Signed)
  Subjective:    Patient ID: [name], female    DOB: [birth year], 78 y.o.   MRN: [id number]  HPI  2 week history of cough. Mostly nonproductive. She's had facial pain progressive maxillary and frontal sinus region and headaches. Increased malaise. Denies any sore throat. Occasional hoarseness. No fever. She is concerned because her son is undergoing surgery soon for squamous cell cancer of the jaw which is recurrent.   Review of Systems  Constitutional: Positive for fatigue. Negative for fever and chills.  HENT: Positive for congestion, postnasal drip and sinus pressure. Negative for sore throat.   Respiratory: Positive for cough.   Cardiovascular: Negative for chest pain.  Neurological: Positive for headaches.       Objective:   Physical Exam  Constitutional: She appears well-developed and well-nourished.  HENT:  Right Ear: External ear normal.  Left Ear: External ear normal.  Mouth/Throat: Oropharynx is clear and moist.  Neck: Neck supple.  Cardiovascular: Normal rate and regular rhythm.   Pulmonary/Chest: Effort normal and breath sounds normal. No respiratory distress. She has no wheezes. She has no rales.  Lymphadenopathy:    She has no cervical adenopathy.          Assessment & Plan:  Acute sinusitis. Amoxicillin 875 mg twice daily for 10 days

## 2011-10-12 ENCOUNTER — Ambulatory Visit
Admission: RE | Admit: 2011-10-12 | Discharge: 2011-10-12 | Disposition: A | Discharge status: Home / Self Care | Payer: Medicare Other | Source: Ambulatory Visit | Attending: Gastroenterology | Admitting: Gastroenterology

## 2011-10-12 DIAGNOSIS — R109 Unspecified abdominal pain: Secondary | ICD-10-CM

## 2011-10-12 DIAGNOSIS — D1800 Hemangioma unspecified site: Secondary | ICD-10-CM

## 2011-11-12 ENCOUNTER — Encounter: Payer: Self-pay | Admitting: Family Medicine

## 2011-11-12 ENCOUNTER — Ambulatory Visit (INDEPENDENT_AMBULATORY_CARE_PROVIDER_SITE_OTHER): Payer: Medicare Other | Admitting: Family Medicine

## 2011-11-12 VITALS — BP 130/72 | Temp 97.6°F | Wt 147.0 lb

## 2011-11-12 DIAGNOSIS — H6980 Other specified disorders of Eustachian tube, unspecified ear: Secondary | ICD-10-CM

## 2011-11-12 MED ORDER — PREDNISONE 10 MG PO TABS: ORAL_TABLET | ORAL | Status: DC

## 2011-11-12 NOTE — Patient Instructions (Signed)
Consider Netti pot for nasal irrigation. 

## 2011-11-12 NOTE — Progress Notes (Signed)
  Subjective:    Patient ID: [name], female    DOB: 1934-01-26, 78 y.o.   MRN: [id number]  HPI  Persistent sensation of bilateral ear pressure. History of recent sinusitis. She has not had any colored nasal discharge at this time. Still some facial fullness and nasal congestion intermittently. Denies any vertigo. No fever or chills. Using Claritin-D with mild relief. No tinnitus.  She uses Flonase but not consistently. No use of saline irrigation. His GERD symptoms controlled with protonix.   Review of Systems  Constitutional: Negative for fever and chills.  HENT: Positive for congestion and sinus pressure. Negative for hearing loss, trouble swallowing, voice change, tinnitus and ear discharge.   Respiratory: Negative for cough and shortness of breath.   Cardiovascular: Negative for chest pain.       Objective:   Physical Exam  Constitutional: She appears well-developed and well-nourished.  HENT:  Mouth/Throat: Oropharynx is clear and moist.       Moderate cerumen right canal removed with curette. Eardrums do not show any clear evidence for chronic serous effusion  Neck: Neck supple.  Cardiovascular: Normal rate and regular rhythm.   Pulmonary/Chest: Effort normal and breath sounds normal. No respiratory distress. She has no wheezes. She has no rales.  Musculoskeletal: She exhibits no edema.          Assessment & Plan:  Eustachian tube dysfunction.  Trial of prednisone taper. Saline nasal irrigation and continue Flonase. Consider ENT referral if not improving over the next couple of weeks

## 2011-11-26 ENCOUNTER — Other Ambulatory Visit: Payer: Self-pay

## 2011-11-30 ENCOUNTER — Ambulatory Visit (INDEPENDENT_AMBULATORY_CARE_PROVIDER_SITE_OTHER): Payer: Medicare Other | Admitting: Family Medicine

## 2011-11-30 ENCOUNTER — Encounter: Payer: Self-pay | Admitting: Family Medicine

## 2011-11-30 VITALS — BP 130/78 | Temp 98.4°F | Wt 146.0 lb

## 2011-11-30 DIAGNOSIS — F419 Anxiety disorder, unspecified: Secondary | ICD-10-CM

## 2011-11-30 DIAGNOSIS — R7303 Prediabetes: Secondary | ICD-10-CM

## 2011-11-30 DIAGNOSIS — R7309 Other abnormal glucose: Secondary | ICD-10-CM

## 2011-11-30 DIAGNOSIS — G4700 Insomnia, unspecified: Secondary | ICD-10-CM

## 2011-11-30 DIAGNOSIS — F5104 Psychophysiologic insomnia: Secondary | ICD-10-CM

## 2011-11-30 DIAGNOSIS — F411 Generalized anxiety disorder: Secondary | ICD-10-CM

## 2011-11-30 DIAGNOSIS — E785 Hyperlipidemia, unspecified: Secondary | ICD-10-CM

## 2011-11-30 LAB — GLUCOSE, POCT (MANUAL RESULT ENTRY): POC Glucose: 116

## 2011-11-30 MED ORDER — CLONAZEPAM 0.5 MG PO TABS: 0.5000 mg | ORAL_TABLET | Freq: Two times a day (BID) | ORAL | Status: DC | PRN

## 2011-11-30 MED ORDER — CITALOPRAM HYDROBROMIDE 20 MG PO TABS: 20.0000 mg | ORAL_TABLET | Freq: Every day | ORAL | Status: DC

## 2011-11-30 NOTE — Progress Notes (Signed)
Subjective:    Patient ID: [name], female    DOB: [birth year], 78 y.o.   MRN: [id number]  HPI  Patient seen for the following issues:    Recent left acute hearing loss. Followed by ENT. No improvement with prednisone. She has further testing today. She has not had any vertigo. No recent headaches. Left hearing loss has not recovered much. She had quite a bit of anxiety related to this. She has periods where she feels almost like a panic attack. She has previously taken alprazolam which helped. She takes Celexa 20 mg daily and is requesting refills. She is dealing with stress of son who has cancer. Depression stable. No suicidal ideation. Frequent insomnia issues.  History of dyslipidemia. Intolerant of statins. Takes TriCor. Recent triglycerides normal. She is inquiring whether she can come off this. She does not recall her triglycerides ever being over 400.  History of prediabetes.  No symptoms of hyperglycemia.  Past Medical History  Diagnosis Date  . Atrial enlargement, left   . Mitral regurgitation   . Mitral valve prolapse   . Palpitations   . Arthritis   . Depression   . GERD (gastroesophageal reflux disease)   . Allergy   . Heart murmur   . Hyperlipidemia   . Hypertension   . Positive TB test    Past Surgical History  Procedure Date  . Anterior posterior and enterocele repairs 09/21/2004    With uterosacral cardinal colposuspension, partial colpocleisis; Gretta Cool, MD  . Breast enhancement surgery 02/25/2002    Bilateral reduction and excision of accessory breast tissue underneath the left breast; Consuello Bossier., MD  . Tonsillectomy and adenoidectomy   . Breast surgery   . Abdominal hysterectomy   . Cataract extraction     reports that she has never smoked. She does not have any smokeless tobacco history on file. She reports that she drinks alcohol. She reports that she does not use illicit drugs. family history includes Arthritis in her father and  mother; Heart disease in her father; Heart failure in her father; Hyperlipidemia in her father and mother; and Hypertension in her mother. Allergies  Allergen Reactions  . Codeine   . Cortisone   . Sulfonamide Derivatives       Review of Systems  Constitutional: Negative for fatigue.  HENT: Positive for hearing loss (Left).   Eyes: Negative for visual disturbance.  Respiratory: Negative for cough, chest tightness, shortness of breath and wheezing.   Cardiovascular: Negative for chest pain, palpitations and leg swelling.  Neurological: Negative for dizziness, seizures, syncope, weakness, light-headedness and headaches.  Hematological: Negative for adenopathy.  Psychiatric/Behavioral: Negative for suicidal ideas, confusion and agitation. The patient is nervous/anxious.        Objective:   Physical Exam  Constitutional: She is oriented to person, place, and time. She appears well-developed and well-nourished. No distress.  HENT:  Right Ear: External ear normal.  Left Ear: External ear normal.  Mouth/Throat: Oropharynx is clear and moist.  Eyes: Pupils are equal, round, and reactive to light.  Neck: Neck supple. No thyromegaly present.  Cardiovascular: Normal rate and regular rhythm.  Exam reveals no gallop.   Pulmonary/Chest: Effort normal and breath sounds normal. No respiratory distress. She has no wheezes. She has no rales.  Musculoskeletal: She exhibits no edema.  Lymphadenopathy:    She has no cervical adenopathy.  Neurological: She is alert and oriented to person, place, and time. No cranial nerve deficit.  Assessment & Plan:  #1 recent left acute hearing loss unresponsive to prednisone. Further followup with ENT later today #2 history of dyslipidemia.  On TriCor with no clear indication. Reassessment at followup in 6 months and continue omega-3 supplement #3 situational anxiety. Limited Klonopin 0.5 mg for rare use every 12 hours as needed #4 history of  depression stable refill Celexa #5 history of prediabetes. Fasting blood sugar today 116

## 2011-11-30 NOTE — Patient Instructions (Signed)
Hold Tricor for now. Continue with fish oil supplement.

## 2012-01-29 ENCOUNTER — Other Ambulatory Visit: Payer: Self-pay | Admitting: Family Medicine

## 2012-01-29 NOTE — Telephone Encounter (Signed)
Refill for 3 months. 

## 2012-01-29 NOTE — Telephone Encounter (Signed)
ambien last filled  09-24-11, #30 with 3 refills

## 2012-01-30 ENCOUNTER — Other Ambulatory Visit: Payer: Self-pay | Admitting: *Deleted

## 2012-01-30 NOTE — Telephone Encounter (Signed)
Zolpidem 5 mg, prn for sleep, last filled 09-14-11, #30 with 3 refills

## 2012-01-30 NOTE — Telephone Encounter (Signed)
OK to refill for 3 months. 

## 2012-01-31 MED ORDER — ZOLPIDEM TARTRATE 5 MG PO TABS: 5.0000 mg | ORAL_TABLET | Freq: Every evening | ORAL | Status: DC | PRN

## 2012-02-26 ENCOUNTER — Other Ambulatory Visit: Payer: Self-pay | Admitting: Cardiology

## 2012-03-12 ENCOUNTER — Encounter: Payer: Self-pay | Admitting: Cardiology

## 2012-03-12 ENCOUNTER — Ambulatory Visit (INDEPENDENT_AMBULATORY_CARE_PROVIDER_SITE_OTHER): Payer: Medicare Other | Admitting: Cardiology

## 2012-03-12 VITALS — BP 114/70 | HR 69 | Ht 66.0 in | Wt 144.0 lb

## 2012-03-12 DIAGNOSIS — E785 Hyperlipidemia, unspecified: Secondary | ICD-10-CM

## 2012-03-12 DIAGNOSIS — I080 Rheumatic disorders of both mitral and aortic valves: Secondary | ICD-10-CM

## 2012-03-12 DIAGNOSIS — I517 Cardiomegaly: Secondary | ICD-10-CM

## 2012-03-12 DIAGNOSIS — I10 Essential (primary) hypertension: Secondary | ICD-10-CM

## 2012-03-12 DIAGNOSIS — I059 Rheumatic mitral valve disease, unspecified: Secondary | ICD-10-CM

## 2012-03-12 DIAGNOSIS — R002 Palpitations: Secondary | ICD-10-CM

## 2012-03-12 MED ORDER — LISINOPRIL-HYDROCHLOROTHIAZIDE 10-12.5 MG PO TABS: 1.0000 | ORAL_TABLET | Freq: Every day | ORAL | Status: DC

## 2012-03-12 NOTE — Patient Instructions (Addendum)
Your physician recommends that you continue on your current medications as directed. Please refer to the Current Medication list given to you today.  Your physician wants you to follow-up in: 1 year. You will receive a reminder letter in the mail two months in advance. If you don't receive a letter, please call our office to schedule the follow-up appointment.  

## 2012-03-12 NOTE — Assessment & Plan Note (Signed)
Stable. No change in treatment. Echocardiogram not indicated. Return the office in a year.

## 2012-03-12 NOTE — Progress Notes (Signed)
HPI [name] returns today for evaluation and management of her history of mild mitral regurgitation, mitral valve prolapse, and palpitations.  She denies any palpitations and no shortness of breath. She's had no chest pain. He does have some edema in her legs  does have significant va at the end of day. They returned normal by morning. She has significant varicosities.  She denies orthopnea or PND. She's had no presyncope or syncope.   Past Medical History  Diagnosis Date  . Atrial enlargement, left   . Mitral regurgitation   . Mitral valve prolapse   . Palpitations   . Arthritis   . Depression   . GERD (gastroesophageal reflux disease)   . Allergy   . Heart murmur   . Hyperlipidemia   . Hypertension   . Positive TB test     Current Outpatient Prescriptions  Medication Sig Dispense Refill  . Cholecalciferol (VITAMIN D) 1000 UNITS capsule Take 1,000 Units by mouth daily.        . citalopram (CELEXA) 20 MG tablet Take 1 tablet (20 mg total) by mouth daily.  90 tablet  3  . clonazePAM (KLONOPIN) 0.5 MG tablet as needed.      . diltiazem (DILACOR XR) 240 MG 24 hr capsule TAKE ONE CAPSULE BY MOUTH EVERY DAY  90 capsule  1  . fish oil-omega-3 fatty acids 1000 MG capsule Take 1 g by mouth daily.        . fluticasone (FLONASE) 50 MCG/ACT nasal spray Place 2 sprays into the nose daily.  48 g  3  . glucose blood test strip Use as instructed  100 each  1  . Lancets 30G MISC Use daily as directed  100 each  1  . lisinopril-hydrochlorothiazide (PRINZIDE,ZESTORETIC) 10-12.5 MG per tablet Take 1 tablet by mouth daily.  90 tablet  3  . pantoprazole (PROTONIX) 40 MG tablet Take 40 mg by mouth daily.        [name] zolpidem (AMBIEN) 5 MG tablet Take 1 tablet (5 mg total) by mouth at bedtime as needed for sleep.  30 tablet  2  . clonazePAM (KLONOPIN) 0.5 MG tablet Take 1 tablet (0.5 mg total) by mouth 2 (two) times daily as needed for anxiety.  30 tablet  1  . traMADol (ULTRAM) 50 MG tablet Take 50 mg  by mouth every 6 (six) hours as needed.        [name] DISCONTD: diltiazem (DILACOR XR) 240 MG 24 hr capsule       . DISCONTD: fenofibrate (TRICOR) 145 MG tablet Take 1 tablet (145 mg total) by mouth daily.  90 tablet  3    Allergies  Allergen Reactions  . Codeine   . Cortisone   . Sulfonamide Derivatives     Family History  Problem Relation Age of Onset  . Heart failure Father   . Arthritis Father   . Hyperlipidemia Father   . Heart disease Father   . Arthritis Mother   . Hyperlipidemia Mother   . Hypertension Mother     History   Social History  . Marital Status: Married    Spouse Name: N/A    Number of Children: N/A  . Years of Education: N/A   Occupational History  . Retired    Social History Main Topics  . Smoking status: Never Smoker   . Smokeless tobacco: Not on file  . Alcohol Use: Yes     one maybe every 3 mths  . Drug Use: No  .  Sexually Active: Not on file   Other Topics Concern  . Not on file   Social History Narrative   MarriedRegular exercise    ROS ALL NEGATIVE EXCEPT THOSE NOTED IN HPI  PE  General Appearance: well developed, well nourished in no acute distress, looks tired.  HEENT: symmetrical face, PERRLA, good dentition  Neck: no JVD, thyromegaly, or adenopathy, trachea midline Chest: symmetric without deformity Cardiac: PMI non-displaced, RRR, normal S1, S2, no gallop or murmur Lung: clear to ausculation and percussion Vascular: all pulses full without bruits  Abdominal: nondistended, nontender, good bowel sounds, no HSM, no bruits Extremities: no cyanosis, clubbing or edema, no sign of DVT, no varicosities  Skin: normal color, no rashes Neuro: alert and oriented x 3, non-focal Pysch: depressed normal sinus rhythm with PVCs. Nonspecific ST segment changes.  EKG  BMET    Component Value Date/Time   NA 135 02/12/2011 0859   K 4.2 02/12/2011 0859   CL 97 02/12/2011 0859   CO2 29 02/12/2011 0859   GLUCOSE 123* 02/12/2011 0859   BUN 18  02/12/2011 0859   CREATININE 0.8 02/12/2011 0859   CALCIUM 9.6 02/12/2011 0859   GFRNONAA >60 08/15/2010 1137   GFRAA  Value: >60        The eGFR has been calculated using the MDRD equation. This calculation has not been validated in all clinical situations. eGFR's persistently <60 mL/min signify possible Chronic Kidney Disease. 08/15/2010 1137    Lipid Panel     Component Value Date/Time   CHOL 188 08/01/2011 0926   TRIG 113.0 08/01/2011 0926   HDL 55.20 08/01/2011 0926   CHOLHDL 3 08/01/2011 0926   VLDL 22.6 08/01/2011 0926   LDLCALC 110* 08/01/2011 0926    CBC    Component Value Date/Time   WBC 8.8 02/12/2011 0859   RBC 4.51 02/12/2011 0859   HGB 14.6 02/12/2011 0859   HCT 43.3 02/12/2011 0859   PLT 418.0* 02/12/2011 0859   MCV 95.8 02/12/2011 0859   MCH 31.8 08/15/2010 1137   MCHC 33.8 02/12/2011 0859   RDW 15.2* 02/12/2011 0859   LYMPHSABS 2.0 02/12/2011 0859   MONOABS 0.6 02/12/2011 0859   EOSABS 0.1 02/12/2011 0859   BASOSABS 0.0 02/12/2011 0859

## 2012-03-12 NOTE — Addendum Note (Signed)
Addended by: Lisabeth Devoid F on: 03/12/2012 09:53 AM   Modules accepted: Orders

## 2012-03-28 ENCOUNTER — Encounter: Payer: Self-pay | Admitting: Family Medicine

## 2012-03-28 ENCOUNTER — Ambulatory Visit (INDEPENDENT_AMBULATORY_CARE_PROVIDER_SITE_OTHER): Payer: Medicare Other | Admitting: Family Medicine

## 2012-03-28 VITALS — BP 140/72 | Temp 98.6°F | Wt 146.0 lb

## 2012-03-28 DIAGNOSIS — H6590 Unspecified nonsuppurative otitis media, unspecified ear: Secondary | ICD-10-CM

## 2012-03-28 DIAGNOSIS — G4700 Insomnia, unspecified: Secondary | ICD-10-CM

## 2012-03-28 DIAGNOSIS — R42 Dizziness and giddiness: Secondary | ICD-10-CM

## 2012-03-28 MED ORDER — CLONAZEPAM 0.5 MG PO TABS: 0.5000 mg | ORAL_TABLET | Freq: Two times a day (BID) | ORAL | Status: DC | PRN

## 2012-03-28 MED ORDER — SCOPOLAMINE 1 MG/3DAYS TD PT72: 1.0000 | MEDICATED_PATCH | TRANSDERMAL | Status: DC

## 2012-03-28 NOTE — Progress Notes (Signed)
  Subjective:    Patient ID: [name], female    DOB: 12-02-1933, 78 y.o.   MRN: [id number]  HPI  Patient seen for multiple issues  Increased stress issues. Son has probably terminal metastatic cancer and dealing with that illness. She's had some recent vertigo symptoms. Right ear full for 3-4 days. No ear drainage. No fever. 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  Sleep disturbance. Dysphoric mood. Requesting refills clonazepam. She's also used Ambien previously. We explained these should not be combined. Hypertension which has been fairly well controlled with lisinopril HCTZ and diltiazem. Takes Celexa 20 mg daily for depression. Overall feels more anxious and depressed but she does have some depression issues. No suicidal ideation.  Past Medical History  Diagnosis Date  . Atrial enlargement, left   . Mitral regurgitation   . Mitral valve prolapse   . Palpitations   . Arthritis   . Depression   . GERD (gastroesophageal reflux disease)   . Allergy   . Heart murmur   . Hyperlipidemia   . Hypertension   . Positive TB test    Past Surgical History  Procedure Date  . Anterior posterior and enterocele repairs 09/21/2004    With uterosacral cardinal colposuspension, partial colpocleisis; Gretta Cool, MD  . Breast enhancement surgery 02/25/2002    Bilateral reduction and excision of accessory breast tissue underneath the left breast; Consuello Bossier., MD  . Tonsillectomy and adenoidectomy   . Breast surgery   . Abdominal hysterectomy   . Cataract extraction     reports that she has never smoked. She does not have any smokeless tobacco history on file. She reports that she drinks alcohol. She reports that she does not use illicit drugs. family history includes Arthritis in her father and mother; Heart disease in her father; Heart failure in her father; Hyperlipidemia in her father and mother; and Hypertension in  her mother. Allergies  Allergen Reactions  . Codeine   . Cortisone   . Sulfonamide Derivatives       Review of Systems  Constitutional: Negative for fever and chills.  HENT: Positive for hearing loss and tinnitus (chronic left). Negative for ear pain, neck pain and ear discharge.   Respiratory: Negative for cough.   Neurological: Negative for headaches.       Objective:   Physical Exam  Constitutional: She is oriented to person, place, and time. She appears well-developed and well-nourished.  HENT:       Moderate cerumen right canal removed with curette. Eardrum normal  Neck: Neck supple. No thyromegaly present.  Cardiovascular: Normal rate and regular rhythm.   Pulmonary/Chest: Breath sounds normal. No respiratory distress. She has no wheezes. She has no rales.  Musculoskeletal: She exhibits no edema.  Neurological: She is alert and oriented to person, place, and time. No cranial nerve deficit.          Assessment & Plan:  #1 vertigo. Probably related to right serous effusion. Scopolamine patch one every 3 days if vertigo symptoms persist but at this point she'll observe. We explained serous effusion may take several weeks to resolve  #2 sleep disturbance. Sleep hygiene discussed.  Klonopin for as necessary use. We have not recommended concomitant Ambien

## 2012-03-28 NOTE — Patient Instructions (Signed)
Serous Otitis Media   Serous otitis media is also known as otitis media with effusion (OME). It means there is fluid in the middle ear space. This space contains the bones for hearing and air. Air in the middle ear space helps to transmit sound.   The air gets there through the eustachian tube. This tube goes from the back of the throat to the middle ear space. It keeps the pressure in the middle ear the same as the outside world. It also helps to drain fluid from the middle ear space.  CAUSES   OME occurs when the eustachian tube gets blocked. Blockage can come from:   Ear infections.   Colds and other upper respiratory infections.   Allergies.   Irritants such as cigarette smoke.   Sudden changes in air pressure (such as descending in an airplane).   Enlarged adenoids.  During colds and upper respiratory infections, the middle ear space can become temporarily filled with fluid. This can happen after an ear infection also. Once the infection clears, the fluid will generally drain out of the ear through the eustachian tube. If it does not, then OME occurs.  SYMPTOMS    Hearing loss.   A feeling of fullness in the ear - but no pain.   Young children may not show any symptoms.  DIAGNOSIS    Diagnosis of OME is made by an ear exam.   Tests may be done to check on the movement of the eardrum.   Hearing exams may be done.  TREATMENT    The fluid most often goes away without treatment.   If allergy is the cause, allergy treatment may be helpful.   Fluid that persists for several months may require minor surgery. A small tube is placed in the ear drum to:   Drain the fluid.   Restore the air in the middle ear space.   In certain situations, antibiotics are used to avoid surgery.   Surgery may be done to remove enlarged adenoids (if this is the cause).  HOME CARE INSTRUCTIONS    Keep children away from tobacco smoke.   Be sure to keep follow up appointments, if any.  SEEK MEDICAL CARE IF:    Hearing is  not better in 3 months.   Hearing is worse.   Ear pain.   Drainage from the ear.   Dizziness.  Document Released: 11/17/2003 Document Revised: 08/16/2011 Document Reviewed: 09/16/2008  ExitCare Patient Information 2012 [employer].

## 2012-05-21 ENCOUNTER — Ambulatory Visit (HOSPITAL_COMMUNITY)
Admission: RE | Admit: 2012-05-21 | Discharge: 2012-05-21 | Disposition: A | Discharge status: Home / Self Care | Payer: Medicare Other | Source: Ambulatory Visit | Attending: Gastroenterology | Admitting: Gastroenterology

## 2012-05-21 ENCOUNTER — Other Ambulatory Visit (HOSPITAL_COMMUNITY): Payer: Self-pay | Admitting: Gastroenterology

## 2012-05-21 DIAGNOSIS — R1310 Dysphagia, unspecified: Secondary | ICD-10-CM

## 2012-05-21 DIAGNOSIS — K449 Diaphragmatic hernia without obstruction or gangrene: Secondary | ICD-10-CM | POA: Insufficient documentation

## 2012-05-21 DIAGNOSIS — K219 Gastro-esophageal reflux disease without esophagitis: Secondary | ICD-10-CM | POA: Insufficient documentation

## 2012-06-02 ENCOUNTER — Ambulatory Visit: Payer: Medicare Other | Admitting: Family Medicine

## 2012-06-04 ENCOUNTER — Ambulatory Visit (INDEPENDENT_AMBULATORY_CARE_PROVIDER_SITE_OTHER): Payer: Medicare Other | Admitting: Family Medicine

## 2012-06-04 ENCOUNTER — Encounter: Payer: Self-pay | Admitting: Family Medicine

## 2012-06-04 VITALS — BP 120/60 | Temp 98.1°F | Wt 145.0 lb

## 2012-06-04 DIAGNOSIS — G4700 Insomnia, unspecified: Secondary | ICD-10-CM

## 2012-06-04 DIAGNOSIS — Z23 Encounter for immunization: Secondary | ICD-10-CM

## 2012-06-04 DIAGNOSIS — E785 Hyperlipidemia, unspecified: Secondary | ICD-10-CM

## 2012-06-04 DIAGNOSIS — D126 Benign neoplasm of colon, unspecified: Secondary | ICD-10-CM | POA: Insufficient documentation

## 2012-06-04 DIAGNOSIS — I10 Essential (primary) hypertension: Secondary | ICD-10-CM

## 2012-06-04 LAB — LIPID PANEL
Cholesterol: 221 mg/dL — ABNORMAL HIGH (ref 0–200)
HDL: 42.7 mg/dL (ref >39.00)
Total CHOL/HDL Ratio: 5
Triglycerides: 190 mg/dL — ABNORMAL HIGH (ref 0.0–149.0)

## 2012-06-04 LAB — BASIC METABOLIC PANEL
CO2: 26 mEq/L (ref 19–32)
Chloride: 101 mEq/L (ref 96–112)
Creatinine, Ser: 0.7 mg/dL (ref 0.4–1.2)
Sodium: 138 mEq/L (ref 135–145)

## 2012-06-04 LAB — LDL CHOLESTEROL, DIRECT: Direct LDL: 143.6 mg/dL

## 2012-06-04 MED ORDER — TRAZODONE HCL 50 MG PO TABS: 50.0000 mg | ORAL_TABLET | Freq: Every evening | ORAL | Status: DC | PRN

## 2012-06-04 NOTE — Progress Notes (Signed)
Subjective:    Patient ID: [name], female    DOB: 03/27/1934, 78 y.o.   MRN: [id number]  HPI  Medical followup. Patient history of hypertension, dyslipidemia, chronic insomnia, depression, mitral valve insufficiency, and prediabetes. She's had previous back surgeries as had some recent back pain. Is seeing neurosurgeon. She is considering repeat injections. Otherwise doing fairly well. She has a son with terminal cancer that she is caring for. Her depression is stable on escitalopram 20 mg daily. She's having significant sleep difficulties. Difficulty falling asleep and staying asleep. No caffeine use late in the day. No alcohol use. She tried Benadryl and melatonin without improvement. Previously used Ambien which has helped.  Requesting flu vaccine. No contraindications. Recent colonoscopy revealed adenomatous polyps. Three-year followup recommended.  History of dyslipidemia. No history of CAD or peripheral last disease. Intolerant of multiple statins. We elected to stop her TriCor last visit and she needs followup lipids today. Blood pressures been well controlled. No orthostasis. No headaches.  Past Medical History  Diagnosis Date  . Atrial enlargement, left   . Mitral regurgitation   . Mitral valve prolapse   . Palpitations   . Arthritis   . Depression   . GERD (gastroesophageal reflux disease)   . Allergy   . Heart murmur   . Hyperlipidemia   . Hypertension   . Positive TB test    Past Surgical History  Procedure Date  . Anterior posterior and enterocele repairs 09/21/2004    With uterosacral cardinal colposuspension, partial colpocleisis; Gretta Cool, MD  . Breast enhancement surgery 02/25/2002    Bilateral reduction and excision of accessory breast tissue underneath the left breast; Consuello Bossier., MD  . Tonsillectomy and adenoidectomy   . Breast surgery   . Abdominal hysterectomy   . Cataract extraction     reports that she has never smoked. She does  not have any smokeless tobacco history on file. She reports that she drinks alcohol. She reports that she does not use illicit drugs. family history includes Arthritis in her father and mother; Heart disease in her father; Heart failure in her father; Hyperlipidemia in her father and mother; and Hypertension in her mother. Allergies  Allergen Reactions  . Codeine     Facial swelling  . Cortisone   . Sulfonamide Derivatives     As a child      Review of Systems  Constitutional: Negative for fatigue.  Eyes: Negative for visual disturbance.  Respiratory: Negative for cough, chest tightness, shortness of breath and wheezing.   Cardiovascular: Negative for chest pain, palpitations and leg swelling.  Genitourinary: Negative for dysuria.  Musculoskeletal: Positive for back pain.  Neurological: Negative for dizziness, seizures, syncope, weakness, light-headedness and headaches.  Psychiatric/Behavioral: Positive for disturbed wake/sleep cycle. Negative for suicidal ideas and confusion.       Objective:   Physical Exam  Constitutional: She is oriented to person, place, and time. She appears well-developed and well-nourished.  Neck: Neck supple. No thyromegaly present.  Cardiovascular: Normal rate and regular rhythm.   Pulmonary/Chest: Effort normal and breath sounds normal. No respiratory distress. She has no wheezes. She has no rales.  Musculoskeletal: She exhibits no edema.  Lymphadenopathy:    She has no cervical adenopathy.  Neurological: She is alert and oriented to person, place, and time. No cranial nerve deficit.          Assessment & Plan:  #1 dyslipidemia. Check fasting lipid panel #2 hypertension well controlled. Check basic metabolic panel #  3 chronic intermittent insomnia. Sleep hygiene discussed- handout given. Trial of trazodone 50 mg each bedtime. Touch base in 2 weeks if no improvement  #4 health maintenance. Flu vaccine given

## 2012-06-04 NOTE — Patient Instructions (Signed)

## 2012-06-05 NOTE — Progress Notes (Signed)
Quick Note:  Pt informed, will send labs to her home, labs ordered ______

## 2012-07-02 ENCOUNTER — Encounter: Payer: Self-pay | Admitting: Family Medicine

## 2012-07-02 ENCOUNTER — Ambulatory Visit (INDEPENDENT_AMBULATORY_CARE_PROVIDER_SITE_OTHER): Payer: Medicare Other | Admitting: Family Medicine

## 2012-07-02 VITALS — BP 89/64 | Temp 97.7°F

## 2012-07-02 DIAGNOSIS — E1169 Type 2 diabetes mellitus with other specified complication: Secondary | ICD-10-CM | POA: Insufficient documentation

## 2012-07-02 DIAGNOSIS — E119 Type 2 diabetes mellitus without complications: Secondary | ICD-10-CM

## 2012-07-02 DIAGNOSIS — R7303 Prediabetes: Secondary | ICD-10-CM

## 2012-07-02 DIAGNOSIS — R7309 Other abnormal glucose: Secondary | ICD-10-CM

## 2012-07-02 MED ORDER — METFORMIN HCL 500 MG PO TABS: 500.0000 mg | ORAL_TABLET | Freq: Two times a day (BID) | ORAL | Status: DC

## 2012-07-02 NOTE — Patient Instructions (Addendum)

## 2012-07-02 NOTE — Progress Notes (Signed)
  Subjective:    Patient ID: [name], female    DOB: 08/11/1934, 78 y.o.   MRN: [id number]  HPI  Patient seen with recent symptoms of increased urine frequency and thirst. Has had about 5 pounds weight loss since last month. She has history prediabetes.  Blood sugar 212 this morning and had postprandial of 381 yesterday. She has never taken any medications for diabetes. No family history diabetes. Under increased stress with son having terminal cancer. She has normal renal function. Previous intolerance to statins because of muscle cramps. Other medications are reviewed.  Past Medical History  Diagnosis Date  . Atrial enlargement, left   . Mitral regurgitation   . Mitral valve prolapse   . Palpitations   . Arthritis   . Depression   . GERD (gastroesophageal reflux disease)   . Allergy   . Heart murmur   . Hyperlipidemia   . Hypertension   . Positive TB test    Past Surgical History  Procedure Date  . Anterior posterior and enterocele repairs 09/21/2004    With uterosacral cardinal colposuspension, partial colpocleisis; Gretta Cool, MD  . Breast enhancement surgery 02/25/2002    Bilateral reduction and excision of accessory breast tissue underneath the left breast; Consuello Bossier., MD  . Tonsillectomy and adenoidectomy   . Breast surgery   . Abdominal hysterectomy   . Cataract extraction     reports that she has never smoked. She does not have any smokeless tobacco history on file. She reports that she drinks alcohol. She reports that she does not use illicit drugs. family history includes Arthritis in her father and mother; Heart disease in her father; Heart failure in her father; Hyperlipidemia in her father and mother; and Hypertension in her mother. Allergies  Allergen Reactions  . Codeine     Facial swelling  . Cortisone   . Sulfonamide Derivatives     As a child      Review of Systems  Constitutional: Positive for fatigue and unexpected weight  change.  Eyes: Negative for visual disturbance.  Respiratory: Negative for cough, chest tightness, shortness of breath and wheezing.   Cardiovascular: Negative for chest pain, palpitations and leg swelling.  Genitourinary: Positive for frequency.  Neurological: Negative for dizziness, seizures, syncope, weakness, light-headedness and headaches.       Objective:   Physical Exam  Constitutional: She appears well-developed and well-nourished. No distress.  HENT:  Mouth/Throat: Oropharynx is clear and moist.  Cardiovascular: Normal rate and regular rhythm.   Pulmonary/Chest: Effort normal and breath sounds normal. No respiratory distress. She has no wheezes. She has no rales.  Musculoskeletal: She exhibits no edema.          Assessment & Plan:  Type 2 diabetes. New-onset. Prior history of prediabetes. Patient's had significant elevations as above along with classic symptoms of weight loss, thirst, and urine frequency. Start metformin 500 mg twice a day. Set up referral to diabetes education Center. Reassess 2 weeks. Obtain baseline A1c.

## 2012-07-04 NOTE — Progress Notes (Signed)
Quick Note:  Pt informed on home VM ______ 

## 2012-07-07 ENCOUNTER — Encounter: Payer: Self-pay | Admitting: Family Medicine

## 2012-07-09 ENCOUNTER — Other Ambulatory Visit: Payer: Self-pay | Admitting: Family Medicine

## 2012-07-09 MED ORDER — GLUCOSE BLOOD VI STRP: ORAL_STRIP | Status: DC

## 2012-07-09 MED ORDER — LANCETS 30G MISC: Status: DC

## 2012-07-09 NOTE — Telephone Encounter (Signed)
Pt needs the blood glu. Test strips for the Bayer and the lancets. [facility] @ [address]

## 2012-07-10 ENCOUNTER — Other Ambulatory Visit: Payer: Medicare Other

## 2012-07-16 ENCOUNTER — Ambulatory Visit: Payer: Medicare Other | Admitting: Dietician

## 2012-07-16 ENCOUNTER — Ambulatory Visit (INDEPENDENT_AMBULATORY_CARE_PROVIDER_SITE_OTHER): Payer: Medicare Other | Admitting: Family Medicine

## 2012-07-16 ENCOUNTER — Encounter: Payer: Self-pay | Admitting: Family Medicine

## 2012-07-16 VITALS — BP 102/60 | Temp 98.0°F | Wt 141.0 lb

## 2012-07-16 DIAGNOSIS — E119 Type 2 diabetes mellitus without complications: Secondary | ICD-10-CM

## 2012-07-16 MED ORDER — METFORMIN HCL 500 MG PO TABS: ORAL_TABLET | ORAL | Status: DC

## 2012-07-16 NOTE — Progress Notes (Signed)
  Subjective:    Patient ID: [name], female    DOB: 1934-05-25, 78 y.o.   MRN: [id number]  HPI  Patient seen in followup type 2 diabetes. Refer to prior note. She had nonfasting blood sugar 381 and had couple fasting blood sugars over 200. She had symptoms of hyperglycemia. We initiated metformin 500 mg twice daily. She increase this to 3 times daily. No diarrhea. Possibly some mild headaches which she thought may be related. No abdominal cramping. Blood sugars greatly improved. Symptoms of hyperglycemia have improved as well. Overall feels better. Several fasting blood sugars low 100 range. Recent A1c 9.2%.  Hypertension which is been well controlled with diltiazem and lisinopril HCTZ. No recent orthostatic symptoms.   Review of Systems  Constitutional: Negative for fatigue.  Eyes: Negative for visual disturbance.  Respiratory: Negative for cough, chest tightness, shortness of breath and wheezing.   Cardiovascular: Negative for chest pain, palpitations and leg swelling.  Neurological: Negative for dizziness, seizures, syncope, weakness, light-headedness and headaches.       Objective:   Physical Exam  Constitutional: She appears well-developed and well-nourished. No distress.  Neck: Neck supple. No thyromegaly present.  Cardiovascular: Normal rate and regular rhythm.   Pulmonary/Chest: Effort normal and breath sounds normal. No respiratory distress. She has no wheezes. She has no rales.  Musculoskeletal: She exhibits no edema.          Assessment & Plan:  Type 2 diabetes. Recent diagnosis. Symptomatically improved. Continue metformin 500 mg 2 in the morning and one at night. Continue close monitoring. Reassess 3 months repeat A1c then. She is encouraged to continue regular activity such as walking.  Did not see diabetic educator secondary to cost.

## 2012-08-10 ENCOUNTER — Other Ambulatory Visit: Payer: Self-pay | Admitting: Family Medicine

## 2012-08-12 ENCOUNTER — Other Ambulatory Visit: Payer: Self-pay | Admitting: *Deleted

## 2012-08-12 NOTE — Telephone Encounter (Signed)
Clonazepam 0.5 BID PRN last filled 11-30-11, #30 with 1 refill

## 2012-08-12 NOTE — Telephone Encounter (Signed)
Refill once 

## 2012-08-13 MED ORDER — CLONAZEPAM 0.5 MG PO TABS: 0.5000 mg | ORAL_TABLET | ORAL | Status: DC | PRN

## 2012-08-14 ENCOUNTER — Telehealth: Payer: Self-pay | Admitting: Family Medicine

## 2012-08-14 MED ORDER — FLUTICASONE PROPIONATE 50 MCG/ACT NA SUSP: 2.0000 | NASAL | Status: DC | PRN

## 2012-08-14 NOTE — Telephone Encounter (Signed)
Pt called and is changing pharmacies from [facility] to [facility]. Pt is needing a renewal of rx for fluticasone (FLONASE) 50 MCG/ACT nasal spray to [facility] on [address] and [address]

## 2012-08-29 ENCOUNTER — Other Ambulatory Visit: Payer: Medicare Other

## 2012-09-08 ENCOUNTER — Other Ambulatory Visit: Payer: Self-pay

## 2012-09-08 ENCOUNTER — Telehealth: Payer: Self-pay | Admitting: Cardiology

## 2012-09-08 MED ORDER — DILTIAZEM HCL ER 240 MG PO CP24: 240.0000 mg | ORAL_CAPSULE | Freq: Every day | ORAL | Status: DC

## 2012-09-08 NOTE — Telephone Encounter (Signed)
Pt needs diltiazem 240mg , pt wants [facility] 90day supply with refills for one year

## 2012-09-12 ENCOUNTER — Other Ambulatory Visit: Payer: Self-pay | Admitting: *Deleted

## 2012-09-12 NOTE — Telephone Encounter (Signed)
Opened in Error.

## 2012-09-22 ENCOUNTER — Encounter: Payer: Self-pay | Admitting: Family Medicine

## 2012-09-22 ENCOUNTER — Ambulatory Visit (INDEPENDENT_AMBULATORY_CARE_PROVIDER_SITE_OTHER): Payer: Medicare Other | Admitting: Family Medicine

## 2012-09-22 VITALS — BP 110/70 | Temp 98.8°F | Wt 139.0 lb

## 2012-09-22 DIAGNOSIS — R300 Dysuria: Secondary | ICD-10-CM

## 2012-09-22 DIAGNOSIS — N390 Urinary tract infection, site not specified: Secondary | ICD-10-CM

## 2012-09-22 LAB — POCT URINALYSIS DIPSTICK
Bilirubin, UA: NEGATIVE
Glucose, UA: NEGATIVE
Ketones, UA: NEGATIVE
Nitrite, UA: NEGATIVE
Spec Grav, UA: 1.015

## 2012-09-22 MED ORDER — CEPHALEXIN 500 MG PO CAPS: 500.0000 mg | ORAL_CAPSULE | Freq: Three times a day (TID) | ORAL | Status: DC

## 2012-09-22 NOTE — Progress Notes (Signed)
  Subjective:    Patient ID: [name], female    DOB: [birth year], 79 y.o.   MRN: [id number]  HPI  Patient seen with 4 to five-day history of frequent urination burning with urination. She's had some increased malaise. Possible low-grade fever. No nausea or vomiting. History of allergy to sulfa. No history of recent UTI. No abdominal pain. No back pain. No alleviating factors  Past Medical History  Diagnosis Date  . Atrial enlargement, left   . Mitral regurgitation   . Mitral valve prolapse   . Palpitations   . Arthritis   . Depression   . GERD (gastroesophageal reflux disease)   . Allergy   . Heart murmur   . Hyperlipidemia   . Hypertension   . Positive TB test    Past Surgical History  Procedure Date  . Anterior posterior and enterocele repairs 09/21/2004    With uterosacral cardinal colposuspension, partial colpocleisis; Gretta Cool, MD  . Breast enhancement surgery 02/25/2002    Bilateral reduction and excision of accessory breast tissue underneath the left breast; Consuello Bossier., MD  . Tonsillectomy and adenoidectomy   . Breast surgery   . Abdominal hysterectomy   . Cataract extraction     reports that she has never smoked. She does not have any smokeless tobacco history on file. She reports that she drinks alcohol. She reports that she does not use illicit drugs. family history includes Arthritis in her father and mother; Heart disease in her father; Heart failure in her father; Hyperlipidemia in her father and mother; and Hypertension in her mother. Allergies  Allergen Reactions  . Codeine     Facial swelling  . Cortisone   . Sulfonamide Derivatives     As a child      Review of Systems  Constitutional: Positive for fever. Negative for chills and appetite change.  Gastrointestinal: Negative for nausea, vomiting, abdominal pain, diarrhea and constipation.  Genitourinary: Positive for dysuria and frequency. Negative for hematuria.    Musculoskeletal: Negative for back pain.  Neurological: Negative for dizziness.       Objective:   Physical Exam  Constitutional: She appears well-developed and well-nourished.  Cardiovascular: Normal rate and regular rhythm.   Pulmonary/Chest: Effort normal and breath sounds normal. No respiratory distress. She has no wheezes. She has no rales.  Musculoskeletal: She exhibits no edema.          Assessment & Plan:  Urinary tract infection. Urine culture sent. Keflex 500 mg 3 times a day for 7 days. Increase fluids

## 2012-09-22 NOTE — Patient Instructions (Addendum)
Urinary Tract Infection Urinary tract infections (UTIs) can develop anywhere along your urinary tract. Your urinary tract is your body's drainage system for removing wastes and extra water. Your urinary tract includes two kidneys, two ureters, a bladder, and a urethra. Your kidneys are a pair of bean-shaped organs. Each kidney is about the size of your fist. They are located below your ribs, one on each side of your spine. CAUSES Infections are caused by microbes, which are microscopic organisms, including fungi, viruses, and bacteria. These organisms are so small that they can only be seen through a microscope. Bacteria are the microbes that most commonly cause UTIs. SYMPTOMS  Symptoms of UTIs may vary by age and gender of the patient and by the location of the infection. Symptoms in young women typically include a frequent and intense urge to urinate and a painful, burning feeling in the bladder or urethra during urination. Older women and men are more likely to be tired, shaky, and weak and have muscle aches and abdominal pain. A fever may mean the infection is in your kidneys. Other symptoms of a kidney infection include pain in your back or sides below the ribs, nausea, and vomiting. DIAGNOSIS To diagnose a UTI, your caregiver will ask you about your symptoms. Your caregiver also will ask to provide a urine sample. The urine sample will be tested for bacteria and white blood cells. White blood cells are made by your body to help fight infection. TREATMENT  Typically, UTIs can be treated with medication. Because most UTIs are caused by a bacterial infection, they usually can be treated with the use of antibiotics. The choice of antibiotic and length of treatment depend on your symptoms and the type of bacteria causing your infection. HOME CARE INSTRUCTIONS  If you were prescribed antibiotics, take them exactly as your caregiver instructs you. Finish the medication even if you feel better after you  have only taken some of the medication.  Drink enough water and fluids to keep your urine clear or pale yellow.  Avoid caffeine, tea, and carbonated beverages. They tend to irritate your bladder.  Empty your bladder often. Avoid holding urine for long periods of time.  Empty your bladder before and after sexual intercourse.  After a bowel movement, women should cleanse from front to back. Use each tissue only once. SEEK MEDICAL CARE IF:   You have back pain.  You develop a fever.  Your symptoms do not begin to resolve within 3 days. SEEK IMMEDIATE MEDICAL CARE IF:   You have severe back pain or lower abdominal pain.  You develop chills.  You have nausea or vomiting.  You have continued burning or discomfort with urination. MAKE SURE YOU:   Understand these instructions.  Will watch your condition.  Will get help right away if you are not doing well or get worse. Document Released: 06/06/2005 Document Revised: 02/26/2012 Document Reviewed: 10/05/2011 [employer] Patient Information 2013 [employer].  

## 2012-09-24 LAB — URINE CULTURE: Colony Count: >=100000

## 2012-09-25 NOTE — Progress Notes (Signed)
Quick Note:  Pt informed ______ 

## 2012-10-09 ENCOUNTER — Other Ambulatory Visit: Payer: Self-pay | Admitting: Gynecology

## 2012-10-09 DIAGNOSIS — Z1231 Encounter for screening mammogram for malignant neoplasm of breast: Secondary | ICD-10-CM

## 2012-10-09 DIAGNOSIS — Z9889 Other specified postprocedural states: Secondary | ICD-10-CM

## 2012-10-14 ENCOUNTER — Other Ambulatory Visit: Payer: Self-pay | Admitting: Family Medicine

## 2012-10-14 ENCOUNTER — Telehealth: Payer: Self-pay | Admitting: Family Medicine

## 2012-10-14 MED ORDER — FLUCONAZOLE 150 MG PO TABS: 150.0000 mg | ORAL_TABLET | Freq: Once | ORAL | Status: DC

## 2012-10-14 NOTE — Telephone Encounter (Signed)
Pt was taking cipro and developed yeast inf. Pt would like diflucan call into [facility] [address]

## 2012-10-14 NOTE — Telephone Encounter (Signed)
Ok. If continued symptoms after this needs to see doctor.

## 2012-10-14 NOTE — Telephone Encounter (Signed)
Patient is aware 

## 2012-10-23 ENCOUNTER — Ambulatory Visit: Payer: Medicare Other | Admitting: Family Medicine

## 2012-11-05 ENCOUNTER — Encounter: Payer: Self-pay | Admitting: Family Medicine

## 2012-11-05 ENCOUNTER — Ambulatory Visit (INDEPENDENT_AMBULATORY_CARE_PROVIDER_SITE_OTHER): Payer: Medicare Other | Admitting: Family Medicine

## 2012-11-05 VITALS — BP 140/80 | Temp 97.4°F | Wt 135.0 lb

## 2012-11-05 DIAGNOSIS — E119 Type 2 diabetes mellitus without complications: Secondary | ICD-10-CM

## 2012-11-05 DIAGNOSIS — F4323 Adjustment disorder with mixed anxiety and depressed mood: Secondary | ICD-10-CM

## 2012-11-05 LAB — HEMOGLOBIN A1C: Hgb A1c MFr Bld: 6.3 % (ref 4.6–6.5)

## 2012-11-05 MED ORDER — CLONAZEPAM 0.5 MG PO TABS: 0.5000 mg | ORAL_TABLET | Freq: Every evening | ORAL | Status: DC | PRN

## 2012-11-05 MED ORDER — SERTRALINE HCL 50 MG PO TABS: 50.0000 mg | ORAL_TABLET | Freq: Every day | ORAL | Status: DC

## 2012-11-05 NOTE — Progress Notes (Signed)
  Subjective:    Patient ID: [name], female    DOB: [birth year], 79 y.o.   MRN: [id number]  HPI Patient doing stress issues her son who is dying of cancer. She remains on clonazepam at night has had great difficulty sleeping without this. She's had some increased depressive symptoms. Poor sleep. Low initiative. Difficulty concentrating. No suicidal ideation. Decreased energy.  Type 2 diabetes. Started metformin a few months ago. A1c 9.2% then. Fasting blood sugars are much improved and mostly 90s to low 100s. No symptoms of hyperglycemia.  Past Medical History  Diagnosis Date  . Atrial enlargement, left   . Mitral regurgitation   . Mitral valve prolapse   . Palpitations   . Arthritis   . Depression   . GERD (gastroesophageal reflux disease)   . Allergy   . Heart murmur   . Hyperlipidemia   . Hypertension   . Positive TB test    Past Surgical History  Procedure Laterality Date  . Anterior posterior and enterocele repairs  09/21/2004    With uterosacral cardinal colposuspension, partial colpocleisis; Gretta Cool, MD  . Breast enhancement surgery  02/25/2002    Bilateral reduction and excision of accessory breast tissue underneath the left breast; Consuello Bossier., MD  . Tonsillectomy and adenoidectomy    . Breast surgery    . Abdominal hysterectomy    . Cataract extraction      reports that she has never smoked. She does not have any smokeless tobacco history on file. She reports that  drinks alcohol. She reports that she does not use illicit drugs. family history includes Arthritis in her father and mother; Heart disease in her father; Heart failure in her father; Hyperlipidemia in her father and mother; and Hypertension in her mother. Allergies  Allergen Reactions  . Codeine     Facial swelling  . Cortisone   . Sulfonamide Derivatives     As a child      Review of Systems  Constitutional: Positive for fatigue. Negative for appetite change and  unexpected weight change.  Eyes: Negative for visual disturbance.  Respiratory: Negative for cough, chest tightness, shortness of breath and wheezing.   Cardiovascular: Negative for chest pain, palpitations and leg swelling.  Endocrine: Negative for polydipsia and polyuria.  Neurological: Negative for dizziness, seizures, syncope, weakness, light-headedness and headaches.  Psychiatric/Behavioral: Positive for sleep disturbance and dysphoric mood. Negative for suicidal ideas and self-injury.       Objective:   Physical Exam  Constitutional: She appears well-developed and well-nourished.  Neck: Neck supple. No thyromegaly present.  Cardiovascular: Normal rate and regular rhythm.   Pulmonary/Chest: Effort normal and breath sounds normal. No respiratory distress. She has no wheezes. She has no rales.  Musculoskeletal: She exhibits no edema.  Lymphadenopathy:    She has no cervical adenopathy.  Psychiatric: Her behavior is normal. Thought content normal.  Patient has somewhat depressed mood          Assessment & Plan:  #1 type 2 diabetes. Recheck A1c. Continue metformin #2 adjustment disorder with depressed mood. We have recommended consideration of sertraline 50 mg daily. We discussed counseling. Followup immediately for any worsening symptoms #3 chronic insomnia. Sleep hygiene discussed. Continue clonazepam as needed

## 2012-11-06 ENCOUNTER — Ambulatory Visit
Admission: RE | Admit: 2012-11-06 | Discharge: 2012-11-06 | Disposition: A | Discharge status: Home / Self Care | Payer: Medicare Other | Source: Ambulatory Visit | Attending: Gynecology | Admitting: Gynecology

## 2012-11-06 DIAGNOSIS — Z1231 Encounter for screening mammogram for malignant neoplasm of breast: Secondary | ICD-10-CM

## 2012-11-06 DIAGNOSIS — Z9889 Other specified postprocedural states: Secondary | ICD-10-CM

## 2012-11-06 NOTE — Progress Notes (Signed)
Quick Note:  Pt informed ______ 

## 2012-12-02 ENCOUNTER — Ambulatory Visit: Payer: Medicare Other | Admitting: Family Medicine

## 2012-12-03 ENCOUNTER — Ambulatory Visit: Payer: Medicare Other | Admitting: Family Medicine

## 2012-12-30 ENCOUNTER — Other Ambulatory Visit: Payer: Self-pay

## 2012-12-30 MED ORDER — LISINOPRIL-HYDROCHLOROTHIAZIDE 10-12.5 MG PO TABS: 1.0000 | ORAL_TABLET | Freq: Every day | ORAL | Status: DC

## 2012-12-30 MED ORDER — DILTIAZEM HCL ER 240 MG PO CP24: 240.0000 mg | ORAL_CAPSULE | Freq: Every day | ORAL | Status: DC

## 2012-12-30 NOTE — Telephone Encounter (Signed)
Your physician recommends that you continue on your current medications as directed. Please refer to the Current Medication list given to you today.  Your physician wants you to follow-up in: 1 year. You will receive a reminder letter in the mail two months in advance. If you don't receive a letter, please call our office to schedule the follow-up appointment.          Patient Instructions History Recorded                     Previous Visit      Provider Department Encounter #   02/26/2012 9:39 AM Valera Castle, MD [facility] [place] [id number]

## 2013-01-08 ENCOUNTER — Ambulatory Visit (INDEPENDENT_AMBULATORY_CARE_PROVIDER_SITE_OTHER): Payer: Medicare Other | Admitting: Family Medicine

## 2013-01-08 ENCOUNTER — Encounter: Payer: Self-pay | Admitting: Family Medicine

## 2013-01-08 VITALS — BP 140/70 | Temp 98.4°F | Wt 134.0 lb

## 2013-01-08 DIAGNOSIS — I10 Essential (primary) hypertension: Secondary | ICD-10-CM

## 2013-01-08 DIAGNOSIS — L659 Nonscarring hair loss, unspecified: Secondary | ICD-10-CM

## 2013-01-08 DIAGNOSIS — K5900 Constipation, unspecified: Secondary | ICD-10-CM

## 2013-01-08 DIAGNOSIS — E119 Type 2 diabetes mellitus without complications: Secondary | ICD-10-CM

## 2013-01-08 NOTE — Progress Notes (Signed)
Quick Note:  Pt informed ______ 

## 2013-01-08 NOTE — Progress Notes (Signed)
  Subjective:    Patient ID: [name], female    DOB: [birth year], 79 y.o.   MRN: [id number]  HPI Patient here for followup for diabetes Last A1c 6.3%. This was less than 3 months ago. Blood sugars very stable. No hypoglycemia. She is taking metformin but has concerns of this may be causing some diffuse hair loss. She's not any recent thyroid functions. She's noticed some hair loss of the past few months. She is dealing with tremendous stress with her son who is dying with cancer She's using alprazolam at night for sleep. This was prescribed by her gynecologist. We have previously prescribed sertraline but she decided not to take this.   Blood pressures been very well controlled. She remains on lisinopril HCTZ and diltiazem. No recent chest pains. Has some generalized fatigue. Also has occasional constipationst. No dyspnea.  Past Medical History  Diagnosis Date  . Atrial enlargement, left   . Mitral regurgitation   . Mitral valve prolapse   . Palpitations   . Arthritis   . Depression   . GERD (gastroesophageal reflux disease)   . Allergy   . Heart murmur   . Hyperlipidemia   . Hypertension   . Positive TB test    Past Surgical History  Procedure Laterality Date  . Anterior posterior and enterocele repairs  09/21/2004    With uterosacral cardinal colposuspension, partial colpocleisis; Gretta Cool, MD  . Breast enhancement surgery  02/25/2002    Bilateral reduction and excision of accessory breast tissue underneath the left breast; Consuello Bossier., MD  . Tonsillectomy and adenoidectomy    . Breast surgery    . Abdominal hysterectomy    . Cataract extraction      reports that she has never smoked. She does not have any smokeless tobacco history on file. She reports that  drinks alcohol. She reports that she does not use illicit drugs. family history includes Arthritis in her father and mother; Heart disease in her father; Heart failure in her father;  Hyperlipidemia in her father and mother; and Hypertension in her mother. Allergies  Allergen Reactions  . Codeine     Facial swelling  . Cortisone   . Sulfonamide Derivatives     As a child      Review of Systems  Constitutional: Positive for fatigue. Negative for fever and chills.  Eyes: Negative for visual disturbance.  Respiratory: Negative for cough, chest tightness, shortness of breath and wheezing.   Cardiovascular: Negative for chest pain, palpitations and leg swelling.  Neurological: Negative for dizziness, seizures, syncope, weakness, light-headedness and headaches.       Objective:   Physical Exam  Constitutional: She appears well-developed and well-nourished.  Neck: Neck supple.  Cardiovascular: Normal rate and regular rhythm.   Pulmonary/Chest: Effort normal. No respiratory distress. She has no wheezes. She has no rales.  Musculoskeletal: She exhibits no edema.  Skin:  Feet reveal no skin lesions. Good distal foot pulses. Good capillary refill. No calluses. Normal sensation with monofilament testing           Assessment & Plan:  #1 type 2 diabetes. History of good control. Recheck A1c a followup. #2 hypertension. Adequately controlled. Continue current medications #3 diffuse alopecia. Check TSH. If normal consider change from metformin

## 2013-02-04 ENCOUNTER — Other Ambulatory Visit: Payer: Self-pay | Admitting: Family Medicine

## 2013-02-19 ENCOUNTER — Other Ambulatory Visit: Payer: Self-pay | Admitting: Family Medicine

## 2013-03-19 ENCOUNTER — Other Ambulatory Visit: Payer: Self-pay | Admitting: *Deleted

## 2013-03-19 MED ORDER — DILTIAZEM HCL ER 240 MG PO CP24: 240.0000 mg | ORAL_CAPSULE | Freq: Every day | ORAL | Status: DC

## 2013-03-20 ENCOUNTER — Telehealth: Payer: Self-pay | Admitting: Cardiology

## 2013-03-20 MED ORDER — DILTIAZEM HCL ER 240 MG PO CP24: 240.0000 mg | ORAL_CAPSULE | Freq: Every day | ORAL | Status: DC

## 2013-03-20 NOTE — Telephone Encounter (Signed)
New Prob     Pt requesting a small sample of DILTIAZEM sent to local pharmacy until mail order comes in.

## 2013-03-20 NOTE — Telephone Encounter (Signed)
Pt aware refill sent in Mylo Red RN

## 2013-04-03 ENCOUNTER — Ambulatory Visit: Payer: Medicare Other | Admitting: Family Medicine

## 2013-04-07 ENCOUNTER — Encounter: Payer: Self-pay | Admitting: Family Medicine

## 2013-04-07 ENCOUNTER — Ambulatory Visit (INDEPENDENT_AMBULATORY_CARE_PROVIDER_SITE_OTHER): Payer: Medicare Other | Admitting: Family Medicine

## 2013-04-07 VITALS — BP 124/68 | HR 78 | Temp 97.9°F | Wt 133.0 lb

## 2013-04-07 DIAGNOSIS — E119 Type 2 diabetes mellitus without complications: Secondary | ICD-10-CM

## 2013-04-07 DIAGNOSIS — G250 Essential tremor: Secondary | ICD-10-CM

## 2013-04-07 DIAGNOSIS — E785 Hyperlipidemia, unspecified: Secondary | ICD-10-CM

## 2013-04-07 DIAGNOSIS — I10 Essential (primary) hypertension: Secondary | ICD-10-CM

## 2013-04-07 LAB — HM DIABETES FOOT EXAM: HM Diabetic Foot Exam: NORMAL

## 2013-04-07 MED ORDER — ALPRAZOLAM 0.5 MG PO TABS: ORAL_TABLET | ORAL | Status: DC

## 2013-04-07 MED ORDER — ALPRAZOLAM 0.5 MG PO TABS: 0.5000 mg | ORAL_TABLET | Freq: Every evening | ORAL | Status: DC | PRN

## 2013-04-07 NOTE — Progress Notes (Signed)
Subjective:    Patient ID: [name], female    DOB: 1934/07/22, 79 y.o.   MRN: [id number]  HPI Followup multiple medical problems. Patient has chronic problems including type 2 diabetes, hypertension, essential tremor, hyperlipidemia, history of cardiomegaly, chronic insomnia, depression She continues to deal with stress issues with her son with terminal cancer who lives with her and her husband.  She has family history of tremor. Her tremors are exacerbated by purposeful activity. At this point, not severe enough to treat in her mind with further medication. Tremor is exacerbated by caffeine and stress  Type 2 diabetes which has been well controlled. Fasting blood sugars consistently between 80s to low 100s. No symptoms of hyperglycemia. Remains on metformin  Hypertension treated with lisinopril HCTZ and diltiazem. Blood pressure stable. No recent orthostasis.  She has some chronic insomnia. She was placed per gynecologist on alprazolam which she continues take at night 0.5 mg. Would recommend trying to avoid regular use if possible. She's not any recent falls. Mood is stable.  Past Medical History  Diagnosis Date  . Atrial enlargement, left   . Mitral regurgitation   . Mitral valve prolapse   . Palpitations   . Arthritis   . Depression   . GERD (gastroesophageal reflux disease)   . Allergy   . Heart murmur   . Hyperlipidemia   . Hypertension   . Positive TB test    Past Surgical History  Procedure Laterality Date  . Anterior posterior and enterocele repairs  09/21/2004    With uterosacral cardinal colposuspension, partial colpocleisis; Gretta Cool, MD  . Breast enhancement surgery  02/25/2002    Bilateral reduction and excision of accessory breast tissue underneath the left breast; Consuello Bossier., MD  . Tonsillectomy and adenoidectomy    . Breast surgery    . Abdominal hysterectomy    . Cataract extraction      reports that she has never smoked. She does  not have any smokeless tobacco history on file. She reports that  drinks alcohol. She reports that she does not use illicit drugs. family history includes Arthritis in her father and mother; Heart disease in her father; Heart failure in her father; Hyperlipidemia in her father and mother; and Hypertension in her mother. Allergies  Allergen Reactions  . Codeine     Facial swelling  . Cortisone   . Sulfonamide Derivatives     As a child      Review of Systems  Constitutional: Negative for fatigue and unexpected weight change.  Eyes: Negative for visual disturbance.  Respiratory: Negative for cough, chest tightness, shortness of breath and wheezing.   Cardiovascular: Negative for chest pain, palpitations and leg swelling.  Endocrine: Negative for polydipsia and polyuria.  Genitourinary: Negative for dysuria.  Neurological: Negative for dizziness, seizures, syncope, weakness, light-headedness and headaches.       Objective:   Physical Exam  Constitutional: She is oriented to person, place, and time. She appears well-developed and well-nourished.  Neck: Neck supple. No thyromegaly present.  Cardiovascular: Normal rate and regular rhythm.   Pulmonary/Chest: Effort normal and breath sounds normal. No respiratory distress. She has no wheezes. She has no rales.  Musculoskeletal: She exhibits no edema.  Neurological: She is alert and oriented to person, place, and time.  No cogwheel rigidity. She has tremor upper extremities at rest which exacerbates with purposeful movement. No bradykinesia           Assessment & Plan:  #1 type 2  diabetes. Recheck A1c. Continue yearly eye exam #2 history of hyperlipidemia. She's been intolerant of multiple statins. Not clear she has tried Systems analyst.  We plan followup fasting lipids in 3 months and consider trial of medication that time if she is willing #3 hypertension. Well controlled. Continue current medications #4 essential tremor. We discussed  treatment options and at this point she wishes to avoid further medications. She is reassured that she has not had any signs or symptoms suggestive of Parkinson's disease

## 2013-04-09 ENCOUNTER — Telehealth: Payer: Self-pay

## 2013-04-09 MED ORDER — LISINOPRIL-HYDROCHLOROTHIAZIDE 10-12.5 MG PO TABS: 1.0000 | ORAL_TABLET | Freq: Every day | ORAL | Status: DC

## 2013-04-09 NOTE — Telephone Encounter (Signed)
Patient called to rqst refill for lisinopril hctz. 30 day supply sent to [facility]. Pt has upcoming appt 8/10/18/12 w/Dr.Wall

## 2013-04-10 ENCOUNTER — Ambulatory Visit: Payer: Medicare Other | Admitting: Family Medicine

## 2013-05-07 ENCOUNTER — Encounter: Payer: Self-pay | Admitting: Cardiology

## 2013-05-07 ENCOUNTER — Ambulatory Visit (INDEPENDENT_AMBULATORY_CARE_PROVIDER_SITE_OTHER): Payer: Medicare Other | Admitting: Cardiology

## 2013-05-07 VITALS — BP 110/62 | HR 71 | Ht 66.0 in | Wt 132.0 lb

## 2013-05-07 DIAGNOSIS — R002 Palpitations: Secondary | ICD-10-CM

## 2013-05-07 DIAGNOSIS — I10 Essential (primary) hypertension: Secondary | ICD-10-CM

## 2013-05-07 DIAGNOSIS — I080 Rheumatic disorders of both mitral and aortic valves: Secondary | ICD-10-CM

## 2013-05-07 DIAGNOSIS — I517 Cardiomegaly: Secondary | ICD-10-CM

## 2013-05-07 DIAGNOSIS — I059 Rheumatic mitral valve disease, unspecified: Secondary | ICD-10-CM

## 2013-05-07 MED ORDER — DILTIAZEM HCL ER 240 MG PO CP24: 240.0000 mg | ORAL_CAPSULE | Freq: Every day | ORAL | Status: DC

## 2013-05-07 MED ORDER — LISINOPRIL-HYDROCHLOROTHIAZIDE 10-12.5 MG PO TABS: 1.0000 | ORAL_TABLET | Freq: Every day | ORAL | Status: DC

## 2013-05-07 NOTE — Patient Instructions (Addendum)
Your physician recommends that you continue on your current medications as directed. Please refer to the Current Medication list given to you today.  Your physician wants you to follow-up in: 1 year with Dr. Katarina Nelson.  You will receive a reminder letter in the mail two months in advance. If you don't receive a letter, please call our office to schedule the follow-up appointment.  

## 2013-05-07 NOTE — Progress Notes (Signed)
HPI [name] returns today for evaluation and management of her history of mitral valve prolapse, mild mitral regurgitation on last echocardiogram in 2012, and history of palpitations.  She's going through a very difficult time losing a 77 year old son to cancer. He also lives with her. She's having difficulty sleeping and is on antidepressant.  She denies any chest pain or palpitations. She has had some lightheadedness with standing on occasion. She does have new onset type 2 diabetes and is most likely a little dehydrated. She's been started on metformin by Dr. Caryl Never.  Past Medical History  Diagnosis Date  . Atrial enlargement, left   . Mitral regurgitation   . Mitral valve prolapse   . Palpitations   . Arthritis   . Depression   . GERD (gastroesophageal reflux disease)   . Allergy   . Heart murmur   . Hyperlipidemia   . Hypertension   . Positive TB test     Current Outpatient Prescriptions  Medication Sig Dispense Refill  . ALPRAZolam (XANAX) 0.5 MG tablet One po qhs prn  30 tablet  3  . Cholecalciferol (VITAMIN D) 1000 UNITS capsule Take 1,000 Units by mouth daily.        . citalopram (CELEXA) 20 MG tablet Take 1 tablet by mouth  (20mg  total) daily  90 tablet  3  . diltiazem (DILACOR XR) 240 MG 24 hr capsule Take 1 capsule (240 mg total) by mouth daily.  90 capsule  0  . estradiol (ESTRACE) 0.1 MG/GM vaginal cream Place 2 g vaginally. Use vaginally 3 times a week      . fish oil-omega-3 fatty acids 1000 MG capsule Take 1 g by mouth daily.        . fluticasone (FLONASE) 50 MCG/ACT nasal spray Place 2 sprays into the nose as needed.  16 g  3  . glucose blood test strip Use once daily as instructed Bayer Contour  100 each  11  . Lancets 30G MISC Use once daily as directed Bayer Contour  100 each  11  . lisinopril-hydrochlorothiazide (PRINZIDE,ZESTORETIC) 10-12.5 MG per tablet Take 1 tablet by mouth daily.  30 tablet  0  . metFORMIN (GLUCOPHAGE) 500 MG tablet TAKE 2 TABLETS  BY MOUTH EVERY MORNING AND 1 TABLET BY MOUTH BEFORE SUPPER  90 tablet  3  . nystatin-triamcinolone (MYCOLOG II) cream Apply 1 application topically 2 (two) times daily. Per Dr Nicholas Lose      . pantoprazole (PROTONIX) 40 MG tablet Take 40 mg by mouth daily.        . traMADol (ULTRAM) 50 MG tablet Take 50 mg by mouth every 6 (six) hours as needed.        . [DISCONTINUED] fenofibrate (TRICOR) 145 MG tablet Take 1 tablet (145 mg total) by mouth daily.  90 tablet  3   No current facility-administered medications for this visit.    Allergies  Allergen Reactions  . Codeine     Facial swelling  . Cortisone   . Sulfonamide Derivatives     As a child    Family History  Problem Relation Age of Onset  . Heart failure Father   . Arthritis Father   . Hyperlipidemia Father   . Heart disease Father   . Arthritis Mother   . Hyperlipidemia Mother   . Hypertension Mother     History   Social History  . Marital Status: Married    Spouse Name: N/A    Number of Children: N/A  .  Years of Education: N/A   Occupational History  . Retired    Social History Main Topics  . Smoking status: Never Smoker   . Smokeless tobacco: Not on file  . Alcohol Use: Yes     Comment: one maybe every 3 mths  . Drug Use: No  . Sexual Activity: Not on file   Other Topics Concern  . Not on file   Social History Narrative   Married   Regular exercise    ROS ALL NEGATIVE EXCEPT THOSE NOTED IN HPI  PE  General Appearance: well developed, well nourished in no acute distress,tearful, tired apperance HEENT: symmetrical face, PERRLA, good dentition  Neck: no JVD, thyromegaly, or adenopathy, trachea midline Chest: symmetric without deformity Cardiac: PMI non-displaced, RRR, normal S1, S2, no gallop, soft systolic murmur at apex. Lung: clear to ausculation and percussion Vascular: all pulses full without bruits  Abdominal: nondistended, nontender, good bowel sounds, no HSM, no bruits Extremities: no cyanosis,  clubbing or edema, no sign of DVT, varicose veins with minimal edema Skin: normal color, no rashes Neuro: alert and oriented x 3, non-focal Pysch: normal affect  EKG Normal sinus rhythm, first-degree AV block no acute changes BMET    Component Value Date/Time   NA 138 06/04/2012 0931   K 3.7 06/04/2012 0931   CL 101 06/04/2012 0931   CO2 26 06/04/2012 0931   GLUCOSE 129* 06/04/2012 0931   BUN 14 06/04/2012 0931   CREATININE 0.7 06/04/2012 0931   CALCIUM 9.5 06/04/2012 0931   GFRNONAA >60 08/15/2010 1137   GFRAA  Value: >60        The eGFR has been calculated using the MDRD equation. This calculation has not been validated in all clinical situations. eGFR's persistently <60 mL/min signify possible Chronic Kidney Disease. 08/15/2010 1137    Lipid Panel     Component Value Date/Time   CHOL 221* 06/04/2012 0931   TRIG 190.0* 06/04/2012 0931   HDL 42.70 06/04/2012 0931   CHOLHDL 5 06/04/2012 0931   VLDL 38.0 06/04/2012 0931   LDLCALC 110* 08/01/2011 0926    CBC    Component Value Date/Time   WBC 8.8 02/12/2011 0859   RBC 4.51 02/12/2011 0859   HGB 14.6 02/12/2011 0859   HCT 43.3 02/12/2011 0859   PLT 418.0* 02/12/2011 0859   MCV 95.8 02/12/2011 0859   MCH 31.8 08/15/2010 1137   MCHC 33.8 02/12/2011 0859   RDW 15.2* 02/12/2011 0859   LYMPHSABS 2.0 02/12/2011 0859   MONOABS 0.6 02/12/2011 0859   EOSABS 0.1 02/12/2011 0859   BASOSABS 0.0 02/12/2011 0859

## 2013-05-12 ENCOUNTER — Other Ambulatory Visit: Payer: Self-pay

## 2013-05-12 MED ORDER — METFORMIN HCL 500 MG PO TABS: ORAL_TABLET | ORAL | Status: DC

## 2013-05-26 ENCOUNTER — Other Ambulatory Visit: Payer: Self-pay | Admitting: Family Medicine

## 2013-06-14 ENCOUNTER — Other Ambulatory Visit: Payer: Self-pay | Admitting: Cardiology

## 2013-08-11 ENCOUNTER — Ambulatory Visit (INDEPENDENT_AMBULATORY_CARE_PROVIDER_SITE_OTHER): Payer: Medicare Other | Admitting: Family Medicine

## 2013-08-11 ENCOUNTER — Encounter: Payer: Self-pay | Admitting: Family Medicine

## 2013-08-11 VITALS — BP 130/70 | HR 80 | Temp 98.0°F | Wt 137.0 lb

## 2013-08-11 DIAGNOSIS — F5104 Psychophysiologic insomnia: Secondary | ICD-10-CM

## 2013-08-11 DIAGNOSIS — G4700 Insomnia, unspecified: Secondary | ICD-10-CM

## 2013-08-11 DIAGNOSIS — F329 Major depressive disorder, single episode, unspecified: Secondary | ICD-10-CM

## 2013-08-11 DIAGNOSIS — F32A Depression, unspecified: Secondary | ICD-10-CM

## 2013-08-11 MED ORDER — CLONAZEPAM 1 MG PO TABS: 1.0000 mg | ORAL_TABLET | Freq: Every day | ORAL | Status: DC

## 2013-08-11 NOTE — Progress Notes (Signed)
Pre visit review using our clinic review tool, if applicable. No additional management support is needed unless otherwise documented below in the visit note. 

## 2013-08-11 NOTE — Progress Notes (Signed)
   Subjective:    Patient ID: [name], female    DOB: 1934-08-22, 79 y.o.   MRN: [id number]  HPI Patient here to discuss stress issues. She is dealing with her son who has metastatic oropharyngeal cancer She's had problems with depression, anxiety, and insomnia. She is requesting possible change from Xanax which she currently takes one at night for sleep the Klonopin. She's had some blurred vision symptoms which he thinks are related to the Xanax. These are only noted morning after taking alprazolam. She's not had any TIA-type symptoms cardiac pain. She has type 2 diabetes was well controlled recent A1c 6.4%. She's had steady gait. No recent falls.  Past Medical History  Diagnosis Date  . Atrial enlargement, left   . Mitral regurgitation   . Mitral valve prolapse   . Palpitations   . Arthritis   . Depression   . GERD (gastroesophageal reflux disease)   . Allergy   . Heart murmur   . Hyperlipidemia   . Hypertension   . Positive TB test    Past Surgical History  Procedure Laterality Date  . Anterior posterior and enterocele repairs  09/21/2004    With uterosacral cardinal colposuspension, partial colpocleisis; Gretta Cool, MD  . Breast enhancement surgery  02/25/2002    Bilateral reduction and excision of accessory breast tissue underneath the left breast; Consuello Bossier., MD  . Tonsillectomy and adenoidectomy    . Breast surgery    . Abdominal hysterectomy    . Cataract extraction      reports that she has never smoked. She does not have any smokeless tobacco history on file. She reports that she drinks alcohol. She reports that she does not use illicit drugs. family history includes Arthritis in her father and mother; Heart disease in her father; Heart failure in her father; Hyperlipidemia in her father and mother; Hypertension in her mother. Allergies  Allergen Reactions  . Codeine     Facial swelling  . Cortisone   . Sulfonamide Derivatives     As a child        Review of Systems  Constitutional: Positive for fatigue.  Respiratory: Negative for cough, chest tightness, shortness of breath and wheezing.   Cardiovascular: Negative for chest pain, palpitations and leg swelling.  Neurological: Negative for dizziness, seizures, syncope, weakness, light-headedness and headaches.       Objective:   Physical Exam  Constitutional: She is oriented to person, place, and time. She appears well-developed and well-nourished.  Cardiovascular: Normal rate.   Pulmonary/Chest: Effort normal and breath sounds normal. No respiratory distress. She has no wheezes. She has no rales.  Musculoskeletal: She exhibits no edema.  Neurological: She is alert and oriented to person, place, and time. No cranial nerve deficit.          Assessment & Plan:  History of chronic anxiety and depression. Dealing with tremendous situational stresses as above. Continue Celexa 20 mg once daily. We've offered counseling. Discontinue alprazolam.  Klonopin 1 mg one half tablet at night as needed. Followup in 3 months and repeat A1c then. Patient has already received flu vaccine. She is inquiring about possible shingles vaccine but is unsure of coverage

## 2013-08-18 ENCOUNTER — Other Ambulatory Visit: Payer: Self-pay | Admitting: Family Medicine

## 2013-09-18 ENCOUNTER — Other Ambulatory Visit: Payer: Self-pay | Admitting: Family Medicine

## 2013-09-30 ENCOUNTER — Telehealth: Payer: Self-pay | Admitting: Family Medicine

## 2013-09-30 NOTE — Telephone Encounter (Signed)
Pt needs new machine, new rx for test strips and lancets due to new medicare coverage.  [facility] [address] [facility] or one touch are the new models

## 2013-10-01 MED ORDER — GLUCOSE BLOOD VI STRP: ORAL_STRIP | Status: DC

## 2013-10-01 MED ORDER — ONETOUCH DELICA LANCETS FINE MISC: Status: DC

## 2013-10-01 NOTE — Telephone Encounter (Signed)
RXs sent to pharmacy. Patient aware that one touch meter is here at the office for pick up.

## 2013-10-02 ENCOUNTER — Encounter: Payer: Self-pay | Admitting: Family Medicine

## 2013-10-02 ENCOUNTER — Ambulatory Visit (INDEPENDENT_AMBULATORY_CARE_PROVIDER_SITE_OTHER): Payer: Medicare Other | Admitting: Family Medicine

## 2013-10-02 VITALS — BP 130/70 | HR 82 | Temp 98.4°F | Wt 136.0 lb

## 2013-10-02 DIAGNOSIS — R05 Cough: Secondary | ICD-10-CM

## 2013-10-02 DIAGNOSIS — R059 Cough, unspecified: Secondary | ICD-10-CM

## 2013-10-02 MED ORDER — LEVOFLOXACIN 500 MG PO TABS: 500.0000 mg | ORAL_TABLET | Freq: Every day | ORAL | Status: DC

## 2013-10-02 MED ORDER — HYDROCODONE-HOMATROPINE 5-1.5 MG/5ML PO SYRP: 5.0000 mL | ORAL_SOLUTION | Freq: Four times a day (QID) | ORAL | Status: AC | PRN

## 2013-10-02 MED ORDER — FLUTICASONE PROPIONATE 50 MCG/ACT NA SUSP: 2.0000 | Freq: Every day | NASAL | Status: DC

## 2013-10-02 NOTE — Progress Notes (Signed)
   Subjective:    Patient ID: [name], female    DOB: 23-Sep-1933, 80 y.o.   MRN: [id number]  HPI Patient seen with cough for approximately 3 weeks duration. Started with typical cold-like symptoms runny years today. She's never had fever. Her cough has been productive of yellow sputum. Persistent sinus congestion occasional facial pain. No wheezing. No dyspnea. No appetite or weight changes. No sore throat. Patient is a nonsmoker. She's tried over-the-counter cough medications . Cough is severe at night. She has codeine intolerance but has taken hydrocodone cough syrup the past without difficulty.  Past Medical History  Diagnosis Date  . Atrial enlargement, left   . Mitral regurgitation   . Mitral valve prolapse   . Palpitations   . Arthritis   . Depression   . GERD (gastroesophageal reflux disease)   . Allergy   . Heart murmur   . Hyperlipidemia   . Hypertension   . Positive TB test    Past Surgical History  Procedure Laterality Date  . Anterior posterior and enterocele repairs  09/21/2004    With uterosacral cardinal colposuspension, partial colpocleisis; Selinda Orion, MD  . Breast enhancement surgery  02/25/2002    Bilateral reduction and excision of accessory breast tissue underneath the left breast; Aretha Parrot., MD  . Tonsillectomy and adenoidectomy    . Breast surgery    . Abdominal hysterectomy    . Cataract extraction      reports that she has never smoked. She does not have any smokeless tobacco history on file. She reports that she drinks alcohol. She reports that she does not use illicit drugs. family history includes Arthritis in her father and mother; Heart disease in her father; Heart failure in her father; Hyperlipidemia in her father and mother; Hypertension in her mother. Allergies  Allergen Reactions  . Codeine     Facial swelling  . Cortisone   . Sulfonamide Derivatives     As a child      Review of Systems  Constitutional:  Negative for fever, chills and unexpected weight change.  HENT: Positive for congestion.   Respiratory: Positive for cough. Negative for shortness of breath and wheezing.   Cardiovascular: Negative for chest pain.  Neurological: Negative for dizziness.       Objective:   Physical Exam  Constitutional: She appears well-developed and well-nourished.  HENT:  Right Ear: External ear normal.  Left Ear: External ear normal.  Mouth/Throat: Oropharynx is clear and moist.  Neck: Neck supple. No thyromegaly present.  Cardiovascular: Normal rate.   Pulmonary/Chest: Effort normal and breath sounds normal. No respiratory distress. She has no wheezes.  Question some faint rales right upper lobe anteriorly  Lymphadenopathy:    She has no cervical adenopathy.          Assessment & Plan:  Cough. Question residual from recent bronchitis. She does have question some faint rales right upper lobe. No fever. Levaquin 500 mg once daily for 7 days. Hycodan cough syrup 1 teaspoon each bedtime when necessary. Refill Flonase. Consider chest x-ray and further evaluation of cough not resolving over the next one to 2 weeks

## 2013-10-02 NOTE — Progress Notes (Signed)
Pre visit review using our clinic review tool, if applicable. No additional management support is needed unless otherwise documented below in the visit note. 

## 2013-10-12 ENCOUNTER — Telehealth: Payer: Self-pay | Admitting: Family Medicine

## 2013-10-12 MED ORDER — LEVOFLOXACIN 500 MG PO TABS: 500.0000 mg | ORAL_TABLET | Freq: Every day | ORAL | Status: DC

## 2013-10-12 NOTE — Telephone Encounter (Signed)
RX sent to pharmacy and pt is aware 

## 2013-10-12 NOTE — Telephone Encounter (Signed)
May refill Levaquin 500 mg po once daily #7 but needs to be seen if no better after that

## 2013-10-12 NOTE — Telephone Encounter (Signed)
Patient Information:  Caller Name: [name]  Phone: [phone]  Patient: [name], [name]  Gender: Female  DOB: 1934-04-02  Age: 80 Years  PCP: Carolann Littler (Family Practice)  Office Follow Up:  Does the office need to follow up with this patient?: Yes  Instructions For The Office: She finished course of antibiotics and thinks she has a sinus infection again, does not want it to go into the Pneumonia she just finished and requesting medication vs. coming into the office.  RN Note:  Afebrile. OV follow up from 10/02/2013 sinus infection and was diagnosed with pneumonia. She finished the course of antibiotics and now has MODERATE sinus pain that is waking her from sleep: sinus drainage,intermittent, productive cough,headaches and nasal congestion. She is taking 12 hour Decongestant, Claritin D, Netti Pot and 400 mg Advil for headaches. She is taking the Claritin D in the am despite being told not to take it with hypertension, states during the day she needs to push through. She does not check her BP. She states the pain/pressure is right below the eyes. She has her son living with her who has cancer and is presently getting Chemo treatment so she refused appointment advised and requesting a prescription be called into the [facility] [phone]. Please advise if a prescription will be called in or if she needs to be seen today, 10/12/2013.  Symptoms  Reason For Call & Symptoms: OV follow up 10/02/2013 "Pneumonia".  Reviewed Health History In EMR: Yes  Reviewed Medications In EMR: Yes  Reviewed Allergies In EMR: Yes  Reviewed Surgeries / Procedures: Yes  Date of Onset of Symptoms: 10/11/2013  Treatments Tried: Decongestant, Flonase and Advil  Treatments Tried Worked: No  Guideline(s) Used:  Sinus Pain and Congestion  Disposition Per Guideline:   See Today in Office  Reason For Disposition Reached:   Earache  Advice Given:  Reassurance:   Sinus congestion is a normal part of a  cold.  For a Runny Nose With Profuse Discharge:  Nasal mucus and discharge helps to wash viruses and bacteria out of the nose and sinuses.  Blowing the nose is all that is needed.  For a Stuffy Nose - Use Nasal Washes:  Introduction: Saline (salt water) nasal irrigation (nasal wash) is an effective and simple home remedy for treating stuffy nose and sinus congestion. The nose can be irrigated by pouring, spraying, or squirting salt water into the nose and then letting it run back out.  Hydration:  Drink plenty of liquids (6-8 glasses of water daily). If the air in your home is dry, use a cool mist humidifier  Expected Course:  Sinus congestion from viral upper respiratory infections (colds) usually lasts 5-10 days.  Call Back If:   Severe pain lasts longer than 2 hours after pain medicine  Fever lasts longer than 3 days  You become worse.  Patient Refused Recommendation:  Patient Requests Prescription  She states the Dr. understands her situation taking care of her son with stage 4 Cancer and requesting medication, antibiotic so she does not have to leave him.

## 2013-10-19 ENCOUNTER — Encounter: Payer: Self-pay | Admitting: Family Medicine

## 2013-10-19 ENCOUNTER — Ambulatory Visit (INDEPENDENT_AMBULATORY_CARE_PROVIDER_SITE_OTHER): Payer: Medicare Other | Admitting: Family Medicine

## 2013-10-19 VITALS — BP 126/66 | HR 85 | Temp 97.7°F | Wt 136.0 lb

## 2013-10-19 DIAGNOSIS — R05 Cough: Secondary | ICD-10-CM

## 2013-10-19 DIAGNOSIS — R059 Cough, unspecified: Secondary | ICD-10-CM

## 2013-10-19 NOTE — Patient Instructions (Signed)
Try Tramadol for the cough.  Follow up promptly for any fever or increased shortness of breath.

## 2013-10-19 NOTE — Progress Notes (Signed)
   Subjective:    Patient ID: [name], female    DOB: [birth year], 80 y.o.   MRN: [id number]  Cough Associated symptoms include postnasal drip. Pertinent negatives include no chest pain, chills, fever, shortness of breath or wheezing.   Patient seen with persistent cough. Duration about one month. She was seen a few weeks ago and treated with Levaquin and did receive one subsequent treatment round as well. She's had some persistent sinus drainage and postnasal drip occasionally. No fever. No dyspnea. No hemoptysis. No pleuritic pain. Cough is been more of a dry aggravating cough. Patient is nonsmoker. Denies any active GERD symptoms. She does take lisinopril HCTZ but has been on this for several years  Past Medical History  Diagnosis Date  . Atrial enlargement, left   . Mitral regurgitation   . Mitral valve prolapse   . Palpitations   . Arthritis   . Depression   . GERD (gastroesophageal reflux disease)   . Allergy   . Heart murmur   . Hyperlipidemia   . Hypertension   . Positive TB test    Past Surgical History  Procedure Laterality Date  . Anterior posterior and enterocele repairs  09/21/2004    With uterosacral cardinal colposuspension, partial colpocleisis; Selinda Orion, MD  . Breast enhancement surgery  02/25/2002    Bilateral reduction and excision of accessory breast tissue underneath the left breast; Aretha Parrot., MD  . Tonsillectomy and adenoidectomy    . Breast surgery    . Abdominal hysterectomy    . Cataract extraction      reports that she has never smoked. She does not have any smokeless tobacco history on file. She reports that she drinks alcohol. She reports that she does not use illicit drugs. family history includes Arthritis in her father and mother; Heart disease in her father; Heart failure in her father; Hyperlipidemia in her father and mother; Hypertension in her mother. Allergies  Allergen Reactions  . Codeine     Facial swelling  .  Cortisone   . Sulfonamide Derivatives     As a child      Review of Systems  Constitutional: Negative for fever and chills.  HENT: Positive for postnasal drip.   Respiratory: Positive for cough. Negative for shortness of breath and wheezing.   Cardiovascular: Negative for chest pain.       Objective:   Physical Exam  Constitutional: She appears well-developed and well-nourished.  HENT:  Mouth/Throat: Oropharynx is clear and moist.  Neck: Neck supple.  Cardiovascular: Normal rate.   Pulmonary/Chest: Effort normal and breath sounds normal. No respiratory distress. She has no wheezes. She has no rales.          Assessment & Plan:  Cough. Suspect post viral bronchitis cough. She has nonfocal exam.  Recommend trial of tramadol for cough suppression. Consider chest x-ray if cough not improving over the next week. Also, consider over-the-counter chlorpheniramine at night for postnasal drip symptoms

## 2013-10-19 NOTE — Progress Notes (Signed)
Pre visit review using our clinic review tool, if applicable. No additional management support is needed unless otherwise documented below in the visit note. 

## 2013-10-21 ENCOUNTER — Other Ambulatory Visit: Payer: Self-pay

## 2013-10-21 DIAGNOSIS — Z1231 Encounter for screening mammogram for malignant neoplasm of breast: Secondary | ICD-10-CM

## 2013-11-03 ENCOUNTER — Ambulatory Visit: Payer: Medicare Other | Admitting: Family Medicine

## 2013-11-04 ENCOUNTER — Encounter: Payer: Self-pay | Admitting: Family Medicine

## 2013-11-04 ENCOUNTER — Ambulatory Visit (INDEPENDENT_AMBULATORY_CARE_PROVIDER_SITE_OTHER): Payer: Medicare Other | Admitting: Family Medicine

## 2013-11-04 VITALS — BP 120/64 | HR 69 | Temp 97.6°F | Wt 136.0 lb

## 2013-11-04 DIAGNOSIS — H6120 Impacted cerumen, unspecified ear: Secondary | ICD-10-CM

## 2013-11-04 DIAGNOSIS — L293 Anogenital pruritus, unspecified: Secondary | ICD-10-CM

## 2013-11-04 DIAGNOSIS — H6121 Impacted cerumen, right ear: Secondary | ICD-10-CM

## 2013-11-04 DIAGNOSIS — L290 Pruritus ani: Secondary | ICD-10-CM

## 2013-11-04 MED ORDER — FLUCONAZOLE 150 MG PO TABS: 150.0000 mg | ORAL_TABLET | Freq: Once | ORAL | Status: DC

## 2013-11-04 NOTE — Progress Notes (Signed)
   Subjective:    Patient ID: [name], female    DOB: [birth year], 80 y.o.   MRN: [id number]  Ear Fullness  Associated symptoms include hearing loss. Pertinent negatives include no ear discharge or rash.   Patient seen for the following issues  Fullness right ear. Recent bronchial infection and cough has resolved. She has some chronic hearing loss but recently worse right ear. No drainage. No dizziness. No pain.  Second issue is she has some itching around her vagina and rectal region. Similar symptoms in the past cleared with fluconazole. Requesting refill. She was on recent antibiotics. No vaginal discharge. No visible rash. No dysuria.  Past Medical History  Diagnosis Date  . Atrial enlargement, left   . Mitral regurgitation   . Mitral valve prolapse   . Palpitations   . Arthritis   . Depression   . GERD (gastroesophageal reflux disease)   . Allergy   . Heart murmur   . Hyperlipidemia   . Hypertension   . Positive TB test    Past Surgical History  Procedure Laterality Date  . Anterior posterior and enterocele repairs  09/21/2004    With uterosacral cardinal colposuspension, partial colpocleisis; Selinda Orion, MD  . Breast enhancement surgery  02/25/2002    Bilateral reduction and excision of accessory breast tissue underneath the left breast; Aretha Parrot., MD  . Tonsillectomy and adenoidectomy    . Breast surgery    . Abdominal hysterectomy    . Cataract extraction      reports that she has never smoked. She does not have any smokeless tobacco history on file. She reports that she drinks alcohol. She reports that she does not use illicit drugs. family history includes Arthritis in her father and mother; Heart disease in her father; Heart failure in her father; Hyperlipidemia in her father and mother; Hypertension in her mother. Allergies  Allergen Reactions  . Codeine     Facial swelling  . Cortisone   . Sulfonamide Derivatives     As a child       Review of Systems  HENT: Positive for hearing loss. Negative for congestion, ear discharge and ear pain.   Skin: Negative for rash.  Neurological: Negative for dizziness.       Objective:   Physical Exam  Constitutional: She appears well-developed and well-nourished.  HENT:  Left Ear: External ear normal.  Mouth/Throat: Oropharynx is clear and moist.  Cerumen impaction right ear canal. Removed with curette without difficulty. Patient tolerated well and noted symptomatic improvement immediately afterwards  Cardiovascular: Normal rate.   Pulmonary/Chest: Effort normal and breath sounds normal. No respiratory distress. She has no wheezes. She has no rales.          Assessment & Plan:  #1 right ear canal cerumen impaction. Removed with curette. Patient symptomatically improved afterwards #2 perineal pruritus. Refill Diflucan 150 mg x1 dose. Touch base if symptoms not improving over the next week

## 2013-11-04 NOTE — Progress Notes (Signed)
Pre visit review using our clinic review tool, if applicable. No additional management support is needed unless otherwise documented below in the visit note. 

## 2013-11-05 ENCOUNTER — Ambulatory Visit: Payer: Medicare Other | Admitting: Family Medicine

## 2013-11-16 ENCOUNTER — Ambulatory Visit: Payer: Medicare Other

## 2013-11-24 ENCOUNTER — Ambulatory Visit: Payer: Medicare Other | Admitting: Family Medicine

## 2013-12-01 ENCOUNTER — Ambulatory Visit: Payer: Medicare Other

## 2013-12-07 ENCOUNTER — Ambulatory Visit (INDEPENDENT_AMBULATORY_CARE_PROVIDER_SITE_OTHER): Payer: Medicare Other | Admitting: Family Medicine

## 2013-12-07 ENCOUNTER — Encounter: Payer: Self-pay | Admitting: Family Medicine

## 2013-12-07 ENCOUNTER — Telehealth: Payer: Self-pay | Admitting: Family Medicine

## 2013-12-07 VITALS — BP 110/64 | HR 84 | Temp 97.6°F | Wt 133.0 lb

## 2013-12-07 DIAGNOSIS — N952 Postmenopausal atrophic vaginitis: Secondary | ICD-10-CM

## 2013-12-07 DIAGNOSIS — M5430 Sciatica, unspecified side: Secondary | ICD-10-CM

## 2013-12-07 DIAGNOSIS — E119 Type 2 diabetes mellitus without complications: Secondary | ICD-10-CM

## 2013-12-07 LAB — HEMOGLOBIN A1C: HEMOGLOBIN A1C: 6.3 % (ref 4.6–6.5)

## 2013-12-07 MED ORDER — ESTRADIOL 0.1 MG/GM VA CREA: 2.0000 g | TOPICAL_CREAM | VAGINAL | Status: DC

## 2013-12-07 MED ORDER — METHYLPREDNISOLONE ACETATE 80 MG/ML IJ SUSP
80.0000 mg | Freq: Once | INTRAMUSCULAR | Status: AC
  Administered 2013-12-07: 80 mg via INTRAMUSCULAR

## 2013-12-07 MED ORDER — NYSTATIN-TRIAMCINOLONE 100000-0.1 UNIT/GM-% EX CREA: 1.0000 "application " | TOPICAL_CREAM | Freq: Two times a day (BID) | CUTANEOUS | Status: DC

## 2013-12-07 MED ORDER — HYDROCODONE-ACETAMINOPHEN 5-325 MG PO TABS: 1.0000 | ORAL_TABLET | Freq: Four times a day (QID) | ORAL | Status: DC | PRN

## 2013-12-07 NOTE — Progress Notes (Signed)
Pre visit review using our clinic review tool, if applicable. No additional management support is needed unless otherwise documented below in the visit note. 

## 2013-12-07 NOTE — Progress Notes (Signed)
   Subjective:    Patient ID: [name], female    DOB: Dec 01, 1933, 80 y.o.   MRN: [id number]  Hip Pain  Pertinent negatives include no numbness.   Symptom onset about 3 days ago. She has had extensive back surgery in the past. No recent injury. She describes sharp pain. Radiation to her lower lumbar area into her buttock and all way down to her lower leg at times. She's had difficulty sleeping secondary to pain. No loss of urine or stool control. No weakness. No numbness. She has taken over-the-counter medication such as Advil without improvement. She has previously received steroid injections which she has tolerated.  Patient also requesting refills of Estrace vaginal cream which she uses about once weekly.  Type 2 diabetes and no A1C since last July.  No symptoms of polyuria or polydipsia.  Past Medical History  Diagnosis Date  . Atrial enlargement, left   . Mitral regurgitation   . Mitral valve prolapse   . Palpitations   . Arthritis   . Depression   . GERD (gastroesophageal reflux disease)   . Allergy   . Heart murmur   . Hyperlipidemia   . Hypertension   . Positive TB test    Past Surgical History  Procedure Laterality Date  . Anterior posterior and enterocele repairs  09/21/2004    With uterosacral cardinal colposuspension, partial colpocleisis; Selinda Orion, MD  . Breast enhancement surgery  02/25/2002    Bilateral reduction and excision of accessory breast tissue underneath the left breast; Aretha Parrot., MD  . Tonsillectomy and adenoidectomy    . Breast surgery    . Abdominal hysterectomy    . Cataract extraction      reports that she has never smoked. She does not have any smokeless tobacco history on file. She reports that she drinks alcohol. She reports that she does not use illicit drugs. family history includes Arthritis in her father and mother; Heart disease in her father; Heart failure in her father; Hyperlipidemia in her father and mother;  Hypertension in her mother. Allergies  Allergen Reactions  . Codeine     Facial swelling  . Cortisone   . Sulfonamide Derivatives     As a child      Review of Systems  Constitutional: Negative for fever, appetite change and unexpected weight change.  Respiratory: Negative for shortness of breath.   Cardiovascular: Negative for chest pain.  Gastrointestinal: Negative for abdominal pain.  Genitourinary: Negative for dysuria.  Musculoskeletal: Positive for back pain.  Neurological: Negative for weakness and numbness.       Objective:   Physical Exam  Constitutional: She appears well-developed and well-nourished.  Cardiovascular: Normal rate.   Pulmonary/Chest: Effort normal and breath sounds normal. No respiratory distress. She has no wheezes. She has no rales.  Musculoskeletal: She exhibits no edema.  Straight leg raise is positive on the left  Neurological:  Full-strength lower extremities. Slightly diminished left ankle compared to right. Knee reflexes are symmetric Normal sensory function.          Assessment & Plan:  #1 left sided sciatica symptoms. Depo-Medrol 80 mg IM given. Previously intolerant to oral steroids. Hydrocodone 5 mg #40 tablets one every 6 hours for severe pain. Followup with neurosurgeon if symptoms persist #2 postmenopausal. Atrophic vaginitis. Refill Estrace cream for as needed use #3 type 2 diabetes.  Repeat A1C.

## 2013-12-07 NOTE — Telephone Encounter (Signed)
Patient Information:  Caller Name: Georgianne  Phone: [phone]  Patient: [name], [name]  Gender: Female  DOB: [birth year]  Age: 80 Years  PCP: Carolann Littler [facility])  Office Follow Up:  Does the office need to follow up with this patient?: No  Instructions For The Office: N/A  RN Note:  Having to use cane to walk. States pain is severe when getting up and down otherwise pain is mild.   States that son is terminally ill with Cancer and has been recently in the hospital and admitted to Hospice.  States that son is very demanding and patient has spent long hours sitting with son over the last three weeks and pain has been consistent with the increased strain and stress on the body from being available to son.  Symptoms  Reason For Call & Symptoms: Left hip going down back of leg into buttocks down into back of knee.  Painful when sitting or standing.  Reviewed Health History In EMR: Yes  Reviewed Medications In EMR: Yes  Reviewed Allergies In EMR: Yes  Reviewed Surgeries / Procedures: Yes  Date of Onset of Symptoms: 11/16/2013  Treatments Tried: Advil  Treatments Tried Worked: No  Guideline(s) Used:  Back Pain  Disposition Per Guideline:   See Today or Tomorrow in Office  Reason For Disposition Reached:   Pain radiates into the thigh or further down the leg  Advice Given:  N/A  Patient Will Follow Care Advice:  YES  Appointment Scheduled:  12/07/2013 15:00:00 Appointment Scheduled Provider:  Carolann Littler (Family Practice)

## 2013-12-07 NOTE — Patient Instructions (Signed)

## 2013-12-20 ENCOUNTER — Other Ambulatory Visit: Payer: Self-pay | Admitting: Family Medicine

## 2013-12-21 NOTE — Telephone Encounter (Signed)
Refill for 6 months. 

## 2013-12-21 NOTE — Telephone Encounter (Signed)
Last visit 12/07/13 Last refill 08/11/13 #30 3 refill

## 2013-12-30 ENCOUNTER — Telehealth: Payer: Self-pay | Admitting: Family Medicine

## 2013-12-30 MED ORDER — METFORMIN HCL 500 MG PO TABS: ORAL_TABLET | ORAL | Status: DC

## 2013-12-30 NOTE — Telephone Encounter (Signed)
Pt forgot to call in her metFORMIN (GLUCOPHAGE) 500 MG tablet to [facility], and now is out. can you send a 90 day to  [facility]/[address]. (fyi: pt's son passed away this week w/ cancer. Pt states she has had so much on her mind and forgot about her own needs)

## 2013-12-30 NOTE — Telephone Encounter (Signed)
Rx sent to pharmacy   

## 2013-12-31 ENCOUNTER — Telehealth: Payer: Self-pay

## 2013-12-31 NOTE — Telephone Encounter (Signed)
Relevant patient education mailed to patient.  

## 2014-01-06 ENCOUNTER — Telehealth: Payer: Self-pay | Admitting: Family Medicine

## 2014-01-06 MED ORDER — METFORMIN HCL 500 MG PO TABS: ORAL_TABLET | ORAL | Status: DC

## 2014-01-06 NOTE — Telephone Encounter (Signed)
[facility] - [place], [place] - [address] is requesting re-fill on metFORMIN (GLUCOPHAGE) 500 MG tablet ° °

## 2014-01-06 NOTE — Telephone Encounter (Signed)
Rx sent to [facility] 

## 2014-01-15 ENCOUNTER — Ambulatory Visit (INDEPENDENT_AMBULATORY_CARE_PROVIDER_SITE_OTHER): Payer: Medicare Other | Admitting: Family Medicine

## 2014-01-15 ENCOUNTER — Encounter: Payer: Self-pay | Admitting: Family Medicine

## 2014-01-15 VITALS — BP 130/68 | HR 93 | Wt 132.0 lb

## 2014-01-15 DIAGNOSIS — M5416 Radiculopathy, lumbar region: Secondary | ICD-10-CM

## 2014-01-15 DIAGNOSIS — IMO0002 Reserved for concepts with insufficient information to code with codable children: Secondary | ICD-10-CM

## 2014-01-15 NOTE — Progress Notes (Signed)
Subjective:    Patient ID: [name], female    DOB: Apr 29, 1934, 80 y.o.   MRN: [id number]  HPI Comments:  Patient is a 80 year-old female presenting with chronic back and leg pain. Symptoms began 3 months ago. History of spinal surgery but no recent injury. She describes the pain as dull like a toothache, but when she stands up it becomes a very sharp pain. Radiation to her lower lumbar area into her buttock and all way down to her left calf, sometimes can feel in right leg as well. The pain has caused difficulty sleeping. Sh has used heat, ice, massage tylenol and advil without any relief. Was previously given a shot which helped and tried hydrocodone but it made her nauseated. Maintains control of bladder and bowel function, no saddle anesthesia. She sees a neurosurgeon Dr. Ellene Route who previously put rods in her back. Needs MRI and would like a muscle relaxer to relieve the pain for the trip to granddaughter's wedding in [place] next weekend.    Leg Pain    Review of Systems  Constitutional: Negative for fever and chills.  Eyes: Negative for visual disturbance.  Respiratory: Negative for cough.   Cardiovascular: Negative for chest pain and palpitations.  Gastrointestinal: Negative for abdominal pain.  Musculoskeletal: Positive for back pain.  Neurological: Negative for dizziness.   Past Medical History  Diagnosis Date  . Atrial enlargement, left   . Mitral regurgitation   . Mitral valve prolapse   . Palpitations   . Arthritis   . Depression   . GERD (gastroesophageal reflux disease)   . Allergy   . Heart murmur   . Hyperlipidemia   . Hypertension   . Positive TB test    Past Surgical History  Procedure Laterality Date  . Anterior posterior and enterocele repairs  09/21/2004    With uterosacral cardinal colposuspension, partial colpocleisis; Selinda Orion, MD  . Breast enhancement surgery  02/25/2002    Bilateral reduction and excision of accessory breast tissue  underneath the left breast; Aretha Parrot., MD  . Tonsillectomy and adenoidectomy    . Breast surgery    . Abdominal hysterectomy    . Cataract extraction         Objective:   Physical Exam  Constitutional: Vital signs are normal. She appears well-developed and well-nourished.  HENT:  Head: Normocephalic.  Eyes: Pupils are equal, round, and reactive to light.  Cardiovascular: Normal rate and intact distal pulses.   Murmur heard. Pulses:      Radial pulses are 2+ on the right side, and 2+ on the left side.  Patient has previously diagnosed Mitral Valve Prolapse  Pulmonary/Chest: Effort normal and breath sounds normal.  Musculoskeletal:       Lumbar back: She exhibits no bony tenderness.  Neurological: She is alert. She has normal strength and normal reflexes. No sensory deficit. Coordination normal.  Reflex Scores:      Patellar reflexes are 2+ on the right side and 2+ on the left side.      Achilles reflexes are 2+ on the right side and 2+ on the left side. Skin: Skin is warm.      Assessment & Plan:  1. Nerve Pain Prescribed gabapentin for nerve pain. Take 100mg  TID for 3-4 days. Then if needed may titrate up to 2 tablets TID.   [place] Krystian Ferrentino, PA-S  Chronic left lumbar radiculopathy pain without focal strength deficit.  She has had some difficulty taking opioids.  Will  try trial of gabapentin as above.  She already has established care with neurosurgeon.  Carolann Littler MD

## 2014-01-15 NOTE — Progress Notes (Signed)
Pre visit review using our clinic review tool, if applicable. No additional management support is needed unless otherwise documented below in the visit note. 

## 2014-01-15 NOTE — Patient Instructions (Signed)
Take 100mg  gabapentin three times a day for 3-4 days. Then may titrate up if needed to 2 tablets three times a day.   Sciatica Sciatica is pain, weakness, numbness, or tingling along the path of the sciatic nerve. The nerve starts in the lower back and runs down the back of each leg. The nerve controls the muscles in the lower leg and in the back of the knee, while also providing sensation to the back of the thigh, lower leg, and the sole of your foot. Sciatica is a symptom of another medical condition. For instance, nerve damage or certain conditions, such as a herniated disk or bone spur on the spine, pinch or put pressure on the sciatic nerve. This causes the pain, weakness, or other sensations normally associated with sciatica. Generally, sciatica only affects one side of the body. CAUSES   Herniated or slipped disc.  Degenerative disk disease.  A pain disorder involving the narrow muscle in the buttocks (piriformis syndrome).  Pelvic injury or fracture.  Pregnancy.  Tumor (rare). SYMPTOMS  Symptoms can vary from mild to very severe. The symptoms usually travel from the low back to the buttocks and down the back of the leg. Symptoms can include:  Mild tingling or dull aches in the lower back, leg, or hip.  Numbness in the back of the calf or sole of the foot.  Burning sensations in the lower back, leg, or hip.  Sharp pains in the lower back, leg, or hip.  Leg weakness.  Severe back pain inhibiting movement. These symptoms may get worse with coughing, sneezing, laughing, or prolonged sitting or standing. Also, being overweight may worsen symptoms. DIAGNOSIS  Your caregiver will perform a physical exam to look for common symptoms of sciatica. He or she may ask you to do certain movements or activities that would trigger sciatic nerve pain. Other tests may be performed to find the cause of the sciatica. These may include:  Blood tests.  X-rays.  Imaging tests, such as an MRI  or CT scan. TREATMENT  Treatment is directed at the cause of the sciatic pain. Sometimes, treatment is not necessary and the pain and discomfort goes away on its own. If treatment is needed, your caregiver may suggest:  Over-the-counter medicines to relieve pain.  Prescription medicines, such as anti-inflammatory medicine, muscle relaxants, or narcotics.  Applying heat or ice to the painful area.  Steroid injections to lessen pain, irritation, and inflammation around the nerve.  Reducing activity during periods of pain.  Exercising and stretching to strengthen your abdomen and improve flexibility of your spine. Your caregiver may suggest losing weight if the extra weight makes the back pain worse.  Physical therapy.  Surgery to eliminate what is pressing or pinching the nerve, such as a bone spur or part of a herniated disk. HOME CARE INSTRUCTIONS   Only take over-the-counter or prescription medicines for pain or discomfort as directed by your caregiver.  Apply ice to the affected area for 20 minutes, 3 4 times a day for the first 48 72 hours. Then try heat in the same way.  Exercise, stretch, or perform your usual activities if these do not aggravate your pain.  Attend physical therapy sessions as directed by your caregiver.  Keep all follow-up appointments as directed by your caregiver.  Do not wear high heels or shoes that do not provide proper support.  Check your mattress to see if it is too soft. A firm mattress may lessen your pain and discomfort.  SEEK IMMEDIATE MEDICAL CARE IF:   You lose control of your bowel or bladder (incontinence).  You have increasing weakness in the lower back, pelvis, buttocks, or legs.  You have redness or swelling of your back.  You have a burning sensation when you urinate.  You have pain that gets worse when you lie down or awakens you at night.  Your pain is worse than you have experienced in the past.  Your pain is lasting longer  than 4 weeks.  You are suddenly losing weight without reason. MAKE SURE YOU:  Understand these instructions.  Will watch your condition.  Will get help right away if you are not doing well or get worse. Document Released: 08/21/2001 Document Revised: 02/26/2012 Document Reviewed: 01/06/2012 [facility] Patient Information 2014 [place].

## 2014-01-18 ENCOUNTER — Telehealth: Payer: Self-pay | Admitting: Family Medicine

## 2014-01-18 MED ORDER — GABAPENTIN 100 MG PO CAPS: 100.0000 mg | ORAL_CAPSULE | Freq: Three times a day (TID) | ORAL | Status: DC

## 2014-01-18 NOTE — Telephone Encounter (Signed)
Pt states she was seen on Friday and dr. Elease Hashimoto was going to provide her with a rx for gabapentin (NEURONTIN) 100 MG capsule, however she had stated to him that she had the meds already but the medication she had was a higher dosage from what dr. Elease Hashimoto prescribed to her. Pt needs the rx called in to [facility] [address].

## 2014-01-18 NOTE — Telephone Encounter (Signed)
Pt states [facility] doesn't have her rx gabapentin (NEURONTIN) 100 MG capsule And asked if we could resend.  Pt states this happens a lot w/ this pharm. 90 cap TIB w/ 2 refills

## 2014-01-18 NOTE — Telephone Encounter (Signed)
Rx sent to pharmacy   

## 2014-01-18 NOTE — Telephone Encounter (Signed)
Rx re sent and called to verify.

## 2014-01-25 ENCOUNTER — Other Ambulatory Visit: Payer: Self-pay | Admitting: Cardiology

## 2014-01-25 ENCOUNTER — Telehealth: Payer: Self-pay | Admitting: Family Medicine

## 2014-01-25 NOTE — Telephone Encounter (Signed)
Pt needs new rx hydrocodone °

## 2014-01-26 ENCOUNTER — Other Ambulatory Visit: Payer: Self-pay

## 2014-01-26 MED ORDER — HYDROCODONE-ACETAMINOPHEN 5-325 MG PO TABS: 1.0000 | ORAL_TABLET | Freq: Four times a day (QID) | ORAL | Status: DC | PRN

## 2014-01-26 NOTE — Telephone Encounter (Signed)
Refill once 

## 2014-01-26 NOTE — Telephone Encounter (Signed)
Last visit 01/15/14 Last refill 12/07/13 #40 0 refill

## 2014-01-26 NOTE — Telephone Encounter (Signed)
Left message on [facility] that RX is ready for pickup 

## 2014-02-04 ENCOUNTER — Ambulatory Visit
Admission: RE | Admit: 2014-02-04 | Discharge: 2014-02-04 | Disposition: A | Discharge status: Home / Self Care | Payer: Medicare Other | Source: Ambulatory Visit

## 2014-02-04 DIAGNOSIS — Z1231 Encounter for screening mammogram for malignant neoplasm of breast: Secondary | ICD-10-CM

## 2014-02-11 ENCOUNTER — Other Ambulatory Visit (HOSPITAL_COMMUNITY): Payer: Self-pay | Admitting: Neurological Surgery

## 2014-02-11 ENCOUNTER — Other Ambulatory Visit: Payer: Self-pay | Admitting: Neurological Surgery

## 2014-02-11 DIAGNOSIS — M5416 Radiculopathy, lumbar region: Secondary | ICD-10-CM

## 2014-02-12 ENCOUNTER — Encounter (HOSPITAL_COMMUNITY): Payer: Self-pay | Admitting: Pharmacy Technician

## 2014-02-15 ENCOUNTER — Ambulatory Visit (HOSPITAL_COMMUNITY)
Admission: RE | Admit: 2014-02-15 | Discharge: 2014-02-15 | Disposition: A | Discharge status: Home / Self Care | Payer: Medicare Other | Source: Ambulatory Visit | Attending: Neurological Surgery | Admitting: Neurological Surgery

## 2014-02-15 DIAGNOSIS — M5416 Radiculopathy, lumbar region: Secondary | ICD-10-CM

## 2014-02-15 DIAGNOSIS — IMO0002 Reserved for concepts with insufficient information to code with codable children: Secondary | ICD-10-CM | POA: Insufficient documentation

## 2014-02-15 DIAGNOSIS — M48061 Spinal stenosis, lumbar region without neurogenic claudication: Secondary | ICD-10-CM | POA: Insufficient documentation

## 2014-02-15 DIAGNOSIS — Z981 Arthrodesis status: Secondary | ICD-10-CM | POA: Insufficient documentation

## 2014-02-15 DIAGNOSIS — M4120 Other idiopathic scoliosis, site unspecified: Secondary | ICD-10-CM | POA: Insufficient documentation

## 2014-02-15 LAB — GLUCOSE, CAPILLARY: Glucose-Capillary: 97 mg/dL (ref 70–99)

## 2014-02-15 MED ORDER — HYDROCODONE-ACETAMINOPHEN 5-325 MG PO TABS
ORAL_TABLET | ORAL | Status: AC
  Filled 2014-02-15: qty 1

## 2014-02-15 MED ORDER — IOHEXOL 180 MG/ML  SOLN
20.0000 mL | Freq: Once | INTRAMUSCULAR | Status: AC | PRN
  Administered 2014-02-15: 16 mL via INTRATHECAL

## 2014-02-15 MED ORDER — DIAZEPAM 5 MG PO TABS
ORAL_TABLET | ORAL | Status: AC
  Administered 2014-02-15: 10 mg via ORAL
  Filled 2014-02-15: qty 2

## 2014-02-15 MED ORDER — ONDANSETRON HCL 4 MG/2ML IJ SOLN: 4.0000 mg | Freq: Four times a day (QID) | INTRAMUSCULAR | Status: DC | PRN

## 2014-02-15 MED ORDER — DIAZEPAM 5 MG PO TABS
10.0000 mg | ORAL_TABLET | Freq: Once | ORAL | Status: AC
  Administered 2014-02-15: 10 mg via ORAL

## 2014-02-15 MED ORDER — HYDROCODONE-ACETAMINOPHEN 5-325 MG PO TABS
1.0000 | ORAL_TABLET | ORAL | Status: DC | PRN
  Administered 2014-02-15: 1 via ORAL

## 2014-02-15 MED ORDER — DIAZEPAM 5 MG PO TABS: 10.0000 mg | ORAL_TABLET | Freq: Once | ORAL | Status: DC

## 2014-02-15 NOTE — Discharge Instructions (Signed)
Myelography Care After These instructions give you information on caring for yourself after your procedure. Your doctor may also give you specific instructions. Call your doctor if you have any problems or questions after your procedure. HOME CARE  Rest often the first day.  When you rest, lie flat, with your head slightly raised (elevated).  Avoid heavy lifting and activity for 48 hours.  You may take the bandage (dressing) off 1 day after the test. GET HELP RIGHT AWAY IF:   You have a very bad headache.  You have a fever. MAKE SURE YOU:  Understand these instructions.  Will watch your condition.  Will get help right away if you are not doing well or get worse. Document Released: 06/05/2008 Document Revised: 08/13/2012 Document Reviewed: 05/21/2012 [facility] Patient Information 2014 [place].

## 2014-02-15 NOTE — Procedures (Signed)
[name] is a 80 year old individual who has had degenerative scoliosis of lumbar spine diagnosed number of years ago. She underwent anterolateral and posterior decompression and fixation from L1-L5. She now has severe pain in the buttock and lower extremities and it is felt that this is likely related to a junctional syndrome at either side of her scoliosis. When radiographs demonstrate that there is been some advanced degenerative changes at L5-S1. A couple of years ago she underwent a myelogram which did not show any compressive phenomenon. Repeat study is now been advised as the pain has become unbearable despite strong narcotic pain medications.  Pre op Dx: Degenerative scoliosis status post decompression arthrodesis L1-L5 Post op Dx: Degenerative scoliosis status post decompression and arthrodesis L1-L5 Procedure: Lumbar myelogram Surgeon: Lyndal Alamillo Puncture level: L2-L3 Fluid color: Clear colorless Injection: Iohexol 1 8014 cc Findings: Junctional spondylosis and stenosis L5-S1 also at T12-L1

## 2014-02-18 ENCOUNTER — Other Ambulatory Visit: Payer: Self-pay | Admitting: Neurological Surgery

## 2014-02-19 ENCOUNTER — Encounter: Payer: Self-pay | Admitting: Family Medicine

## 2014-02-19 ENCOUNTER — Ambulatory Visit (INDEPENDENT_AMBULATORY_CARE_PROVIDER_SITE_OTHER): Payer: Medicare Other | Admitting: Family Medicine

## 2014-02-19 VITALS — BP 124/76 | HR 80 | Temp 98.0°F | Wt 133.0 lb

## 2014-02-19 DIAGNOSIS — R300 Dysuria: Secondary | ICD-10-CM

## 2014-02-19 LAB — POCT URINALYSIS DIPSTICK
Bilirubin, UA: NEGATIVE
Glucose, UA: NEGATIVE
Ketones, UA: NEGATIVE
Nitrite, UA: NEGATIVE
PROTEIN UA: NEGATIVE
Spec Grav, UA: 1.015
UROBILINOGEN UA: 0.2
pH, UA: 5

## 2014-02-19 MED ORDER — CEPHALEXIN 500 MG PO CAPS: 500.0000 mg | ORAL_CAPSULE | Freq: Three times a day (TID) | ORAL | Status: DC

## 2014-02-19 NOTE — Progress Notes (Signed)
Pre visit review using our clinic review tool, if applicable. No additional management support is needed unless otherwise documented below in the visit note. 

## 2014-02-19 NOTE — Progress Notes (Signed)
   Subjective:    Patient ID: [name], female    DOB: [birth year], 80 y.o.   MRN: [id number]  Dysuria  Associated symptoms include frequency. Pertinent negatives include no chills, nausea or vomiting.   Patient seen for chief complaint dysuria. Couple days ago onset of some urine frequency and mild burning with urination. Symptoms are somewhat intermittent. She's had some chills no fever. No nausea or vomiting. No flank pain. She had similar symptoms with Escherichia coli UTI back in January 2014. No infection since then.  Past Medical History  Diagnosis Date  . Atrial enlargement, left   . Mitral regurgitation   . Mitral valve prolapse   . Palpitations   . Arthritis   . Depression   . GERD (gastroesophageal reflux disease)   . Allergy   . Heart murmur   . Hyperlipidemia   . Hypertension   . Positive TB test    Past Surgical History  Procedure Laterality Date  . Anterior posterior and enterocele repairs  09/21/2004    With uterosacral cardinal colposuspension, partial colpocleisis; Selinda Orion, MD  . Breast enhancement surgery  02/25/2002    Bilateral reduction and excision of accessory breast tissue underneath the left breast; Aretha Parrot., MD  . Tonsillectomy and adenoidectomy    . Breast surgery    . Abdominal hysterectomy    . Cataract extraction      reports that she has never smoked. She does not have any smokeless tobacco history on file. She reports that she drinks alcohol. She reports that she does not use illicit drugs. family history includes Arthritis in her father and mother; Heart disease in her father; Heart failure in her father; Hyperlipidemia in her father and mother; Hypertension in her mother. Allergies  Allergen Reactions  . Codeine     Facial swelling  . Cortisone     Insomnia, heart palpitations  . Sulfonamide Derivatives     As a child      Review of Systems  Constitutional: Negative for fever, chills and appetite change.    Gastrointestinal: Negative for nausea, vomiting, abdominal pain, diarrhea and constipation.  Genitourinary: Positive for dysuria and frequency.  Musculoskeletal: Negative for back pain.  Neurological: Negative for dizziness.       Objective:   Physical Exam  Constitutional: She appears well-developed and well-nourished.  HENT:  Mouth/Throat: Oropharynx is clear and moist.  Cardiovascular: Normal rate and regular rhythm.   Pulmonary/Chest: Effort normal and breath sounds normal. No respiratory distress. She has no wheezes. She has no rales.          Assessment & Plan:  Urinary tract infection. Urine culture sent. Keflex 500 mg 3 times a day for 7 days pending culture results.

## 2014-02-19 NOTE — Patient Instructions (Signed)
Urinary Tract Infection  Urinary tract infections (UTIs) can develop anywhere along your urinary tract. Your urinary tract is your body's drainage system for removing wastes and extra water. Your urinary tract includes two kidneys, two ureters, a bladder, and a urethra. Your kidneys are a pair of bean-shaped organs. Each kidney is about the size of your fist. They are located below your ribs, one on each side of your spine.  CAUSES  Infections are caused by microbes, which are microscopic organisms, including fungi, viruses, and bacteria. These organisms are so small that they can only be seen through a microscope. Bacteria are the microbes that most commonly cause UTIs.  SYMPTOMS   Symptoms of UTIs may vary by age and gender of the patient and by the location of the infection. Symptoms in young women typically include a frequent and intense urge to urinate and a painful, burning feeling in the bladder or urethra during urination. Older women and men are more likely to be tired, shaky, and weak and have muscle aches and abdominal pain. A fever may mean the infection is in your kidneys. Other symptoms of a kidney infection include pain in your back or sides below the ribs, nausea, and vomiting.  DIAGNOSIS  To diagnose a UTI, your caregiver will ask you about your symptoms. Your caregiver also will ask to provide a urine sample. The urine sample will be tested for bacteria and white blood cells. White blood cells are made by your body to help fight infection.  TREATMENT   Typically, UTIs can be treated with medication. Because most UTIs are caused by a bacterial infection, they usually can be treated with the use of antibiotics. The choice of antibiotic and length of treatment depend on your symptoms and the type of bacteria causing your infection.  HOME CARE INSTRUCTIONS   If you were prescribed antibiotics, take them exactly as your caregiver instructs you. Finish the medication even if you feel better after you  have only taken some of the medication.   Drink enough water and fluids to keep your urine clear or pale yellow.   Avoid caffeine, tea, and carbonated beverages. They tend to irritate your bladder.   Empty your bladder often. Avoid holding urine for long periods of time.   Empty your bladder before and after sexual intercourse.   After a bowel movement, women should cleanse from front to back. Use each tissue only once.  SEEK MEDICAL CARE IF:    You have back pain.   You develop a fever.   Your symptoms do not begin to resolve within 3 days.  SEEK IMMEDIATE MEDICAL CARE IF:    You have severe back pain or lower abdominal pain.   You develop chills.   You have nausea or vomiting.   You have continued burning or discomfort with urination.  MAKE SURE YOU:    Understand these instructions.   Will watch your condition.   Will get help right away if you are not doing well or get worse.  Document Released: 06/06/2005 Document Revised: 02/26/2012 Document Reviewed: 10/05/2011  [employer] Patient Information 2014 [employer].

## 2014-02-22 ENCOUNTER — Other Ambulatory Visit: Payer: Self-pay

## 2014-02-22 LAB — URINE CULTURE

## 2014-02-22 MED ORDER — LEVOFLOXACIN 500 MG PO TABS: 500.0000 mg | ORAL_TABLET | Freq: Every day | ORAL | Status: DC

## 2014-03-05 ENCOUNTER — Encounter (HOSPITAL_COMMUNITY): Payer: Self-pay

## 2014-03-06 NOTE — Pre-Procedure Instructions (Signed)
[facility] - Preparing for Surgery  Before surgery, you can play an important role.  Because skin is not sterile, your skin needs to be as free of germs as possible.  You can reduce the number of germs on you skin by washing with CHG (chlorahexidine gluconate) soap before surgery.  CHG is an antiseptic cleaner which kills germs and bonds with the skin to continue killing germs even after washing.  Please DO NOT use if you have an allergy to CHG or antibacterial soaps.  If your skin becomes reddened/irritated stop using the CHG and inform your nurse when you arrive at Short Stay.  Do not shave (including legs and underarms) for at least 48 hours prior to the first CHG shower.  You may shave your face.  Please follow these instructions carefully:   1.  Shower with CHG Soap the night before surgery and the morning of Surgery.  2.  If you choose to wash your hair, wash your hair first as usual with your normal shampoo.  3.  After you shampoo, rinse your hair and body thoroughly to remove the shampoo.  4.  Use CHG as you would any other liquid soap.  You can apply CHG directly to the skin and wash gently with scrungie or a clean washcloth.  5.  Apply the CHG Soap to your body ONLY FROM THE NECK DOWN.  Do not use on open wounds or open sores.  Avoid contact with your eyes, ears, mouth and genitals (private parts).  Wash genitals (private parts) with your normal soap.  6.  Wash thoroughly, paying special attention to the area where your surgery will be performed.  7.  Thoroughly rinse your body with warm water from the neck down.  8.  DO NOT shower/wash with your normal soap after using and rinsing off the CHG Soap.  9.  Pat yourself dry with a clean towel.            10.  Wear clean pajamas.            11.  Place clean sheets on your bed the night of your first shower and do not sleep with pets.  Day of Surgery  Do not apply any lotions the morning of surgery.  Please wear clean clothes to the  hospital/surgery center.   

## 2014-03-06 NOTE — Pre-Procedure Instructions (Addendum)
[name]  03/06/2014   Your procedure is scheduled on:  June 7  Report to [facility] Admitting at 05:30 AM.  Call this number if you have problems the morning of surgery: [phone]   Remember:   Do not eat food or drink liquids after midnight.   Take these medicines the morning of surgery with A SIP OF WATER: Diltiazem, Flonase, Ranitidine, Hydrocodone (if needed)   STOP Vitamin D, Fish Oil 03/12/14       NO DIABETIC MED AM OF SURGERY   STOP/ Do not take Aspirin, Aleve, Naproxen, Advil, Ibuprofen, Motrin, Vitamins, Herbs, or Supplements starting 03/12/14   Do not wear jewelry, make-up or nail polish.  Do not wear lotions, powders, or perfumes. You may wear deodorant.  Do not shave 48 hours prior to surgery. Men may shave face and neck.  Do not bring valuables to the hospital.  [facility] is not responsible for any belongings or valuables.               Contacts, dentures or bridgework may not be worn into surgery.  Leave suitcase in the car. After surgery it may be brought to your room.  For patients admitted to the hospital, discharge time is determined by your treatment team.               Special Instructions: See [facility] Preparing For Surgery   Please read over the following fact sheets that you were given: Pain Booklet, Coughing and Deep Breathing and Surgical Site Infection Prevention

## 2014-03-08 ENCOUNTER — Encounter (HOSPITAL_COMMUNITY)
Admission: RE | Admit: 2014-03-08 | Discharge: 2014-03-08 | Disposition: A | Discharge status: Home / Self Care | Payer: Medicare Other | Source: Ambulatory Visit | Attending: Anesthesiology | Admitting: Anesthesiology

## 2014-03-08 ENCOUNTER — Encounter (HOSPITAL_COMMUNITY)
Admission: RE | Admit: 2014-03-08 | Discharge: 2014-03-08 | Disposition: A | Discharge status: Home / Self Care | Payer: Medicare Other | Source: Ambulatory Visit | Attending: Neurological Surgery | Admitting: Neurological Surgery

## 2014-03-08 ENCOUNTER — Encounter (HOSPITAL_COMMUNITY): Payer: Self-pay

## 2014-03-08 DIAGNOSIS — Z01812 Encounter for preprocedural laboratory examination: Secondary | ICD-10-CM | POA: Insufficient documentation

## 2014-03-08 DIAGNOSIS — Z01818 Encounter for other preprocedural examination: Secondary | ICD-10-CM | POA: Insufficient documentation

## 2014-03-08 HISTORY — DX: Personal history of other diseases of the digestive system: Z87.19

## 2014-03-08 HISTORY — DX: Nausea with vomiting, unspecified: R11.2

## 2014-03-08 HISTORY — DX: Type 2 diabetes mellitus without complications: E11.9

## 2014-03-08 HISTORY — DX: Other specified postprocedural states: Z98.890

## 2014-03-08 LAB — CBC
HEMATOCRIT: 41.4 % (ref 36.0–46.0)
Hemoglobin: 13.9 g/dL (ref 12.0–15.0)
MCH: 32.3 pg (ref 26.0–34.0)
MCHC: 33.6 g/dL (ref 30.0–36.0)
MCV: 96.3 fL (ref 78.0–100.0)
PLATELETS: 396 10*3/uL (ref 150–400)
RBC: 4.3 MIL/uL (ref 3.87–5.11)
RDW: 14.1 % (ref 11.5–15.5)
WBC: 8.2 10*3/uL (ref 4.0–10.5)

## 2014-03-08 LAB — SURGICAL PCR SCREEN
MRSA, PCR: NEGATIVE
Staphylococcus aureus: NEGATIVE

## 2014-03-08 LAB — BASIC METABOLIC PANEL
BUN: 16 mg/dL (ref 6–23)
CHLORIDE: 94 meq/L — AB (ref 96–112)
CO2: 24 meq/L (ref 19–32)
CREATININE: 0.56 mg/dL (ref 0.50–1.10)
Calcium: 9.6 mg/dL (ref 8.4–10.5)
GFR calc Af Amer: >90 mL/min (ref >90)
GFR calc non Af Amer: 87 mL/min — ABNORMAL LOW (ref >90)
GLUCOSE: 99 mg/dL (ref 70–99)
Potassium: 4 mEq/L (ref 3.7–5.3)
Sodium: 137 mEq/L (ref 137–147)

## 2014-03-08 NOTE — Progress Notes (Signed)
ofice called to release orders

## 2014-03-09 NOTE — Progress Notes (Signed)
Anesthesia Chart Review:  Patient is a 80 year old female scheduled for L5-S1 Extraforaminal diskectomy with Metrex on 03/16/14 by Dr. Ellene Route.    History includes non-smoker, post-operative N/V, mild MR with bileaflet MVP and mild left atrial enlargement (02/2011 echo), palpitations, HLD, DM2, HTN, GERD, hiatal hernia, positive PPD, depression, arthritis, breast augmentation, T&A, back surgery. PCP is listed as Dr. Carolann Littler.  Cardiologist was Dr. Jenell Milliner (last visit 04/2013 with one year follow-up recommended), but she is scheduled to get established with Dr. Dorris Carnes on 2014-05-20.    EKG on 2013/05/20 showed: SR with first degree AVB, septal infarct (age undetermined), non-specific inferolateral ST abnormality--new/more pronounced since prior EKG on 05-20-13.  Echo on 02/19/11 showed:  - Left ventricle: The cavity size was normal. Wall thickness was normal. Systolic function was normal. The estimated ejection fraction was in the range of 55% to 60%. - Mitral valve: Marked myxomatous changes and thickening of both mitral leaflet tips especially the ventricular surface. Mild MR and bileaflet prolapse Valve area by pressure half-time: 2.14cm^2. Valve area by continuity equation (using LVOT flow): 1.79cm^2. - Left atrium: The atrium was mildly dilated. - Atrial septum: No defect or patent foramen ovale was identified. - Tricuspid valve: Mild regurgitation.  Holter monitor in 02/2011 showed NSR, no significant bradycardia.  CXR on 03/08/14 showed: No acute cardiopulmonary abnormality seen.  Preoperative labs noted.  A1C on 12/07/13 was 6.3.  I called and spoke with patient.  She denies chest pain, syncope, SOB at rest, significant or persistent palpitations, new edema.  She has not been able to be very active due to hip pain.  She does have DOE, but hasn't noticed any significant increase.  She feels she is doing well from a cardiac standpoint and denies any new or progressive symptoms since her last  cardiology visit. Above previously reviewed with anesthesiologist Dr. Orene Desanctis. Anticipate she can proceed if no acute changes, but would recommend repeating her EKG with her underlying valvular disease and since it has been nearly a year since her last tracing. Patient notified.    George Hugh [facility] Short Stay Center/Anesthesiology Phone [phone] 03/09/2014 4:14 PM

## 2014-03-15 MED ORDER — CEFAZOLIN SODIUM-DEXTROSE 2-3 GM-% IV SOLR
2.0000 g | INTRAVENOUS | Status: AC
  Administered 2014-03-16: 2 g via INTRAVENOUS
  Filled 2014-03-15: qty 50

## 2014-03-16 ENCOUNTER — Encounter (HOSPITAL_COMMUNITY): Payer: Self-pay | Admitting: Anesthesiology

## 2014-03-16 ENCOUNTER — Encounter (HOSPITAL_COMMUNITY)
Admission: RE | Disposition: A | Discharge status: Home / Self Care | Payer: Self-pay | Source: Ambulatory Visit | Attending: Neurological Surgery

## 2014-03-16 ENCOUNTER — Observation Stay (HOSPITAL_COMMUNITY)
Admission: RE | Admit: 2014-03-16 | Discharge: 2014-03-16 | Disposition: A | Discharge status: Home / Self Care | Payer: Medicare Other | Source: Ambulatory Visit | Attending: Neurological Surgery | Admitting: Neurological Surgery

## 2014-03-16 ENCOUNTER — Inpatient Hospital Stay (HOSPITAL_COMMUNITY): Payer: Medicare Other

## 2014-03-16 ENCOUNTER — Inpatient Hospital Stay (HOSPITAL_COMMUNITY): Payer: Medicare Other | Admitting: Anesthesiology

## 2014-03-16 ENCOUNTER — Encounter (HOSPITAL_COMMUNITY): Payer: Medicare Other | Admitting: Vascular Surgery

## 2014-03-16 DIAGNOSIS — I10 Essential (primary) hypertension: Secondary | ICD-10-CM | POA: Insufficient documentation

## 2014-03-16 DIAGNOSIS — E785 Hyperlipidemia, unspecified: Secondary | ICD-10-CM | POA: Insufficient documentation

## 2014-03-16 DIAGNOSIS — M5126 Other intervertebral disc displacement, lumbar region: Principal | ICD-10-CM | POA: Insufficient documentation

## 2014-03-16 DIAGNOSIS — F329 Major depressive disorder, single episode, unspecified: Secondary | ICD-10-CM | POA: Insufficient documentation

## 2014-03-16 DIAGNOSIS — K449 Diaphragmatic hernia without obstruction or gangrene: Secondary | ICD-10-CM | POA: Insufficient documentation

## 2014-03-16 DIAGNOSIS — F3289 Other specified depressive episodes: Secondary | ICD-10-CM | POA: Insufficient documentation

## 2014-03-16 DIAGNOSIS — D649 Anemia, unspecified: Secondary | ICD-10-CM | POA: Insufficient documentation

## 2014-03-16 DIAGNOSIS — E119 Type 2 diabetes mellitus without complications: Secondary | ICD-10-CM | POA: Insufficient documentation

## 2014-03-16 DIAGNOSIS — Z981 Arthrodesis status: Secondary | ICD-10-CM | POA: Insufficient documentation

## 2014-03-16 DIAGNOSIS — M5127 Other intervertebral disc displacement, lumbosacral region: Secondary | ICD-10-CM | POA: Diagnosis present

## 2014-03-16 DIAGNOSIS — K219 Gastro-esophageal reflux disease without esophagitis: Secondary | ICD-10-CM | POA: Insufficient documentation

## 2014-03-16 HISTORY — PX: LUMBAR LAMINECTOMY/ DECOMPRESSION WITH MET-RX: SHX5959

## 2014-03-16 LAB — GLUCOSE, CAPILLARY
GLUCOSE-CAPILLARY: 129 mg/dL — AB (ref 70–99)
Glucose-Capillary: 118 mg/dL — ABNORMAL HIGH (ref 70–99)
Glucose-Capillary: 103 mg/dL — ABNORMAL HIGH (ref 70–99)

## 2014-03-16 SURGERY — LUMBAR LAMINECTOMY/ DECOMPRESSION WITH MET-RX
Anesthesia: General | Site: Spine Lumbar

## 2014-03-16 MED ORDER — CLONAZEPAM 0.5 MG PO TABS: 1.0000 mg | ORAL_TABLET | Freq: Every day | ORAL | Status: DC

## 2014-03-16 MED ORDER — LISINOPRIL-HYDROCHLOROTHIAZIDE 10-12.5 MG PO TABS: 1.0000 | ORAL_TABLET | Freq: Every day | ORAL | Status: DC

## 2014-03-16 MED ORDER — MORPHINE SULFATE 2 MG/ML IJ SOLN: 1.0000 mg | INTRAMUSCULAR | Status: DC | PRN

## 2014-03-16 MED ORDER — ONDANSETRON HCL 4 MG/2ML IJ SOLN
4.0000 mg | Freq: Once | INTRAMUSCULAR | Status: DC | PRN
Start: 2014-03-16 — End: 2014-03-16

## 2014-03-16 MED ORDER — ROCURONIUM BROMIDE 50 MG/5ML IV SOLN
INTRAVENOUS | Status: AC
  Filled 2014-03-16: qty 1

## 2014-03-16 MED ORDER — METHOCARBAMOL 1000 MG/10ML IJ SOLN
500.0000 mg | Freq: Four times a day (QID) | INTRAMUSCULAR | Status: DC | PRN
  Filled 2014-03-16: qty 5

## 2014-03-16 MED ORDER — EPHEDRINE SULFATE 50 MG/ML IJ SOLN
INTRAMUSCULAR | Status: AC
  Filled 2014-03-16: qty 1

## 2014-03-16 MED ORDER — LISINOPRIL 10 MG PO TABS: 10.0000 mg | ORAL_TABLET | Freq: Every day | ORAL | Status: DC

## 2014-03-16 MED ORDER — DILTIAZEM HCL ER 240 MG PO CP24: 240.0000 mg | ORAL_CAPSULE | Freq: Every day | ORAL | Status: DC

## 2014-03-16 MED ORDER — METHOCARBAMOL 500 MG PO TABS: 500.0000 mg | ORAL_TABLET | Freq: Four times a day (QID) | ORAL | Status: DC | PRN

## 2014-03-16 MED ORDER — FAMOTIDINE 20 MG PO TABS
20.0000 mg | ORAL_TABLET | Freq: Two times a day (BID) | ORAL | Status: DC
  Filled 2014-03-16: qty 1

## 2014-03-16 MED ORDER — SODIUM CHLORIDE 0.9 % IJ SOLN
3.0000 mL | Freq: Two times a day (BID) | INTRAMUSCULAR | Status: DC
  Administered 2014-03-16: 3 mL via INTRAVENOUS

## 2014-03-16 MED ORDER — PHENYLEPHRINE 40 MCG/ML (10ML) SYRINGE FOR IV PUSH (FOR BLOOD PRESSURE SUPPORT)
PREFILLED_SYRINGE | INTRAVENOUS | Status: AC
  Filled 2014-03-16: qty 10

## 2014-03-16 MED ORDER — EPHEDRINE SULFATE 50 MG/ML IJ SOLN
INTRAMUSCULAR | Status: DC | PRN
  Administered 2014-03-16 (×2): 15 mg via INTRAVENOUS

## 2014-03-16 MED ORDER — OXYCODONE-ACETAMINOPHEN 5-325 MG PO TABS: 1.0000 | ORAL_TABLET | ORAL | Status: DC | PRN

## 2014-03-16 MED ORDER — ALUM & MAG HYDROXIDE-SIMETH 200-200-20 MG/5ML PO SUSP: 30.0000 mL | Freq: Four times a day (QID) | ORAL | Status: DC | PRN

## 2014-03-16 MED ORDER — LIDOCAINE-EPINEPHRINE 1 %-1:100000 IJ SOLN
INTRAMUSCULAR | Status: DC | PRN
  Administered 2014-03-16: 2 mL

## 2014-03-16 MED ORDER — HYDROMORPHONE HCL PF 1 MG/ML IJ SOLN: 0.2500 mg | INTRAMUSCULAR | Status: DC | PRN

## 2014-03-16 MED ORDER — PHENYLEPHRINE HCL 10 MG/ML IJ SOLN
INTRAMUSCULAR | Status: DC | PRN
  Administered 2014-03-16: 80 ug via INTRAVENOUS
  Administered 2014-03-16: 120 ug via INTRAVENOUS

## 2014-03-16 MED ORDER — 0.9 % SODIUM CHLORIDE (POUR BTL) OPTIME
TOPICAL | Status: DC | PRN
  Administered 2014-03-16: 1000 mL

## 2014-03-16 MED ORDER — HEMOSTATIC AGENTS (NO CHARGE) OPTIME
TOPICAL | Status: DC | PRN
  Administered 2014-03-16: 1 via TOPICAL

## 2014-03-16 MED ORDER — LIDOCAINE HCL (CARDIAC) 20 MG/ML IV SOLN
INTRAVENOUS | Status: DC | PRN
  Administered 2014-03-16: 100 mg via INTRAVENOUS

## 2014-03-16 MED ORDER — METFORMIN HCL 500 MG PO TABS
1000.0000 mg | ORAL_TABLET | Freq: Every day | ORAL | Status: DC
  Filled 2014-03-16: qty 2

## 2014-03-16 MED ORDER — PROPOFOL 10 MG/ML IV BOLUS
INTRAVENOUS | Status: AC
  Filled 2014-03-16: qty 20

## 2014-03-16 MED ORDER — FENTANYL CITRATE 0.05 MG/ML IJ SOLN
INTRAMUSCULAR | Status: DC | PRN
  Administered 2014-03-16: 25 ug via INTRAVENOUS
  Administered 2014-03-16: 50 ug via INTRAVENOUS
  Administered 2014-03-16: 75 ug via INTRAVENOUS

## 2014-03-16 MED ORDER — KETOROLAC TROMETHAMINE 15 MG/ML IJ SOLN
INTRAMUSCULAR | Status: DC | PRN
Start: 2014-03-16 — End: 2014-03-16
  Administered 2014-03-16: 15 mg via INTRAVENOUS

## 2014-03-16 MED ORDER — ROCURONIUM BROMIDE 100 MG/10ML IV SOLN
INTRAVENOUS | Status: DC | PRN
  Administered 2014-03-16: 35 mg via INTRAVENOUS

## 2014-03-16 MED ORDER — ONDANSETRON HCL 4 MG/2ML IJ SOLN
INTRAMUSCULAR | Status: DC | PRN
  Administered 2014-03-16 (×2): 4 mg via INTRAVENOUS

## 2014-03-16 MED ORDER — PROPOFOL 10 MG/ML IV BOLUS
INTRAVENOUS | Status: DC | PRN
  Administered 2014-03-16: 140 mg via INTRAVENOUS

## 2014-03-16 MED ORDER — HYDROCODONE-ACETAMINOPHEN 5-325 MG PO TABS: 1.0000 | ORAL_TABLET | Freq: Four times a day (QID) | ORAL | Status: DC | PRN

## 2014-03-16 MED ORDER — SUCCINYLCHOLINE CHLORIDE 20 MG/ML IJ SOLN
INTRAMUSCULAR | Status: AC
  Filled 2014-03-16: qty 1

## 2014-03-16 MED ORDER — ONDANSETRON HCL 4 MG/2ML IJ SOLN
INTRAMUSCULAR | Status: AC
  Filled 2014-03-16: qty 2

## 2014-03-16 MED ORDER — ONDANSETRON HCL 4 MG/2ML IJ SOLN: 4.0000 mg | INTRAMUSCULAR | Status: DC | PRN

## 2014-03-16 MED ORDER — FENTANYL CITRATE 0.05 MG/ML IJ SOLN
INTRAMUSCULAR | Status: AC
  Filled 2014-03-16: qty 5

## 2014-03-16 MED ORDER — METFORMIN HCL 500 MG PO TABS
500.0000 mg | ORAL_TABLET | Freq: Every day | ORAL | Status: DC
  Filled 2014-03-16: qty 1

## 2014-03-16 MED ORDER — GLYCOPYRROLATE 0.2 MG/ML IJ SOLN
INTRAMUSCULAR | Status: DC | PRN
  Administered 2014-03-16: .6 mg via INTRAVENOUS

## 2014-03-16 MED ORDER — METFORMIN HCL 500 MG PO TABS: 500.0000 mg | ORAL_TABLET | Freq: Two times a day (BID) | ORAL | Status: DC

## 2014-03-16 MED ORDER — SCOPOLAMINE 1 MG/3DAYS TD PT72
MEDICATED_PATCH | TRANSDERMAL | Status: AC
  Administered 2014-03-16: 1 via TRANSDERMAL
  Filled 2014-03-16: qty 1

## 2014-03-16 MED ORDER — MENTHOL 3 MG MT LOZG: 1.0000 | LOZENGE | OROMUCOSAL | Status: DC | PRN

## 2014-03-16 MED ORDER — LIDOCAINE HCL (CARDIAC) 20 MG/ML IV SOLN
INTRAVENOUS | Status: AC
  Filled 2014-03-16: qty 5

## 2014-03-16 MED ORDER — SODIUM CHLORIDE 0.9 % IR SOLN
Status: DC | PRN
  Administered 2014-03-16: 09:00:00

## 2014-03-16 MED ORDER — PHENYLEPHRINE HCL 10 MG/ML IJ SOLN
10.0000 mg | INTRAMUSCULAR | Status: DC | PRN
  Administered 2014-03-16: 40 ug/min via INTRAVENOUS

## 2014-03-16 MED ORDER — FLUTICASONE PROPIONATE 50 MCG/ACT NA SUSP: 2.0000 | Freq: Every day | NASAL | Status: DC | PRN

## 2014-03-16 MED ORDER — PHENOL 1.4 % MT LIQD: 1.0000 | OROMUCOSAL | Status: DC | PRN

## 2014-03-16 MED ORDER — THROMBIN 5000 UNITS EX SOLR
CUTANEOUS | Status: DC | PRN
  Administered 2014-03-16 (×2): 5000 [IU] via TOPICAL

## 2014-03-16 MED ORDER — BUPIVACAINE HCL (PF) 0.5 % IJ SOLN
INTRAMUSCULAR | Status: DC | PRN
  Administered 2014-03-16: 22 mL
  Administered 2014-03-16: 2 mL

## 2014-03-16 MED ORDER — SODIUM CHLORIDE 0.9 % IJ SOLN
INTRAMUSCULAR | Status: AC
  Filled 2014-03-16: qty 10

## 2014-03-16 MED ORDER — HYDROCHLOROTHIAZIDE 12.5 MG PO CAPS: 12.5000 mg | ORAL_CAPSULE | Freq: Every day | ORAL | Status: DC

## 2014-03-16 MED ORDER — NEOSTIGMINE METHYLSULFATE 10 MG/10ML IV SOLN
INTRAVENOUS | Status: DC | PRN
  Administered 2014-03-16: 3 mg via INTRAVENOUS

## 2014-03-16 MED ORDER — LACTATED RINGERS IV SOLN
INTRAVENOUS | Status: DC | PRN
  Administered 2014-03-16 (×2): via INTRAVENOUS

## 2014-03-16 MED ORDER — KETOROLAC TROMETHAMINE 15 MG/ML IJ SOLN
15.0000 mg | Freq: Four times a day (QID) | INTRAMUSCULAR | Status: DC
  Administered 2014-03-16: 15 mg via INTRAVENOUS
  Filled 2014-03-16: qty 1

## 2014-03-16 MED ORDER — SODIUM CHLORIDE 0.9 % IJ SOLN: 3.0000 mL | INTRAMUSCULAR | Status: DC | PRN

## 2014-03-16 SURGICAL SUPPLY — 54 items
BAG DECANTER FOR FLEXI CONT (MISCELLANEOUS) ×2 IMPLANT
BLADE 10 SAFETY STRL DISP (BLADE) IMPLANT
BLADE SURG 15 STRL LF DISP TIS (BLADE) ×1 IMPLANT
BLADE SURG 15 STRL SS (BLADE) ×1
BLADE SURG ROTATE 9660 (MISCELLANEOUS) IMPLANT
CANISTER SUCT 3000ML (MISCELLANEOUS) ×2 IMPLANT
CONT SPEC 4OZ CLIKSEAL STRL BL (MISCELLANEOUS) ×2 IMPLANT
DECANTER SPIKE VIAL GLASS SM (MISCELLANEOUS) ×2 IMPLANT
DERMABOND ADHESIVE PROPEN (GAUZE/BANDAGES/DRESSINGS) ×1
DERMABOND ADVANCED (GAUZE/BANDAGES/DRESSINGS)
DERMABOND ADVANCED .7 DNX12 (GAUZE/BANDAGES/DRESSINGS) IMPLANT
DERMABOND ADVANCED .7 DNX6 (GAUZE/BANDAGES/DRESSINGS) ×1 IMPLANT
DRAPE C-ARM 42X72 X-RAY (DRAPES) ×6 IMPLANT
DRAPE LAPAROTOMY 100X72 PEDS (DRAPES) ×2 IMPLANT
DRAPE MICROSCOPE LEICA (MISCELLANEOUS) ×2 IMPLANT
DRAPE POUCH INSTRU U-SHP 10X18 (DRAPES) ×2 IMPLANT
DURAPREP 6ML APPLICATOR 50/CS (WOUND CARE) ×2 IMPLANT
ELECT BLADE 6.5 EXT (BLADE) ×2 IMPLANT
ELECT REM PT RETURN 9FT ADLT (ELECTROSURGICAL) ×2
ELECTRODE REM PT RTRN 9FT ADLT (ELECTROSURGICAL) ×1 IMPLANT
GAUZE SPONGE 4X4 16PLY XRAY LF (GAUZE/BANDAGES/DRESSINGS) IMPLANT
GLOVE BIOGEL PI IND STRL 7.5 (GLOVE) ×1 IMPLANT
GLOVE BIOGEL PI IND STRL 8.5 (GLOVE) ×1 IMPLANT
GLOVE BIOGEL PI INDICATOR 7.5 (GLOVE) ×1
GLOVE BIOGEL PI INDICATOR 8.5 (GLOVE) ×1
GLOVE ECLIPSE 8.5 STRL (GLOVE) ×2 IMPLANT
GLOVE ECLIPSE 9.0 STRL (GLOVE) ×2 IMPLANT
GLOVE EXAM NITRILE LRG STRL (GLOVE) IMPLANT
GLOVE EXAM NITRILE MD LF STRL (GLOVE) IMPLANT
GLOVE EXAM NITRILE XL STR (GLOVE) IMPLANT
GLOVE EXAM NITRILE XS STR PU (GLOVE) IMPLANT
GLOVE SURG SS PI 7.0 STRL IVOR (GLOVE) ×4 IMPLANT
GOWN STRL REUS W/ TWL LRG LVL3 (GOWN DISPOSABLE) ×1 IMPLANT
GOWN STRL REUS W/ TWL XL LVL3 (GOWN DISPOSABLE) ×1 IMPLANT
GOWN STRL REUS W/TWL 2XL LVL3 (GOWN DISPOSABLE) ×2 IMPLANT
GOWN STRL REUS W/TWL LRG LVL3 (GOWN DISPOSABLE) ×1
GOWN STRL REUS W/TWL XL LVL3 (GOWN DISPOSABLE) ×1
KIT BASIN OR (CUSTOM PROCEDURE TRAY) ×2 IMPLANT
KIT ROOM TURNOVER OR (KITS) ×2 IMPLANT
NEEDLE HYPO 18GX1.5 BLUNT FILL (NEEDLE) IMPLANT
NEEDLE HYPO 22GX1.5 SAFETY (NEEDLE) ×2 IMPLANT
NEEDLE SPNL 20GX3.5 QUINCKE YW (NEEDLE) ×2 IMPLANT
NS IRRIG 1000ML POUR BTL (IV SOLUTION) ×2 IMPLANT
PACK LAMINECTOMY NEURO (CUSTOM PROCEDURE TRAY) ×2 IMPLANT
PAD ARMBOARD 7.5X6 YLW CONV (MISCELLANEOUS) ×10 IMPLANT
RUBBERBAND STERILE (MISCELLANEOUS) ×4 IMPLANT
SPONGE SURGIFOAM ABS GEL SZ50 (HEMOSTASIS) ×2 IMPLANT
SUT VIC AB 3-0 SH 8-18 (SUTURE) ×4 IMPLANT
SYR 20ML ECCENTRIC (SYRINGE) ×2 IMPLANT
SYR 5ML LL (SYRINGE) IMPLANT
TOWEL OR 17X24 6PK STRL BLUE (TOWEL DISPOSABLE) ×2 IMPLANT
TOWEL OR 17X26 10 PK STRL BLUE (TOWEL DISPOSABLE) ×2 IMPLANT
WATER STERILE IRR 1000ML POUR (IV SOLUTION) ×2 IMPLANT
WIRE TIP MIS 2.5MM NEURO (BURR) ×2 IMPLANT

## 2014-03-16 NOTE — Anesthesia Postprocedure Evaluation (Signed)
  Anesthesia Post-op Note  Patient: [name]  Procedure(s) Performed: Procedure(s): LUMBAR FIVE-SACRAL ONE EXTRAFORAMINAL DISKECTOMY WITH METREX (N/A)  Patient Location: PACU  Anesthesia Type:General  Level of Consciousness: awake, oriented, sedated and patient cooperative  Airway and Oxygen Therapy: Patient Spontanous Breathing  Post-op Pain: mild  Post-op Assessment: Post-op Vital signs reviewed, Patient's Cardiovascular Status Stable, Respiratory Function Stable, Patent Airway, No signs of Nausea or vomiting and Pain level controlled  Post-op Vital Signs: stable  Last Vitals:  Filed Vitals:   03/16/14 1012  BP: 117/44  Pulse:   Temp:   Resp: 32    Complications: No apparent anesthesia complications

## 2014-03-16 NOTE — Op Note (Signed)
Date of surgery: 03/16/2014  Pre op Diagnosis: Herniated nucleus pulposus L5-S1 right extraforaminal with right L5 radiculopathy. Status post decompression and fusion L1-L5 secondary to degenerative scoliosis  Post op Diagnosis:Lumbar herniated nucleous pulposus without myelopathy L5-S1 right extraforaminal with L5 radiculopathy. Status post decompression and fusion L1-L5 secondary to degenerative scoliosis  Procedure: MetRx extraforaminal micro-disc ectomy L5-S1 right, decompression of L5 nerve root, operating microscope  Surgeon:Shakenya Stoneberg, MD  Assistant: Deri Fuelling  Anesthesia: General endotracheal  Indications: Patient is a 80 year old individual who has had significant right L5 radiculopathy. This is been a chronic problem. She has had comprehensive conservative management without relief. She's been advised regarding the need for surgical decompression of L5-S1 using a Metrix technique in the extraforaminal space secondary to an extraforaminal disc herniation noted on myelography and post milligrams CAT scan.  Procedure: The patient was brought to the operating room supine on a stretcher. After the smooth induction of general endotracheal anesthesia the patient was turned prone onto the operating table. The bony prominences were appropriately padded and protected. The back was prepped with alcohol and DuraPrep and draped sterilely. Fluoroscopic guidance was used to localize the appropriate level L5-S1 on the right in the extraforaminal zone. The skin was infiltrated with 5 cc of lidocaine with epinephrine mixed 50-50 with half percent Marcaine. A small vertical incision an inch in length was created. A K wire was passed to the laminar arch of L5 in the far lateral space and a wanding technique was used to create a space around the lamina and pars. The inferior portion of the rod on the right side could also be palpated and felt with a wanting technique. Dilators were then passed to dilate to  the 20 mm diameter. A 5 centimeter by 22 millimeter  cannula was fixed to the operating table with a clamp.  The operating microscope was brought into the field. Through this aperture the soft tissues were cleared from the lateral aspect of the pars and the superior articular process of the facet joint. The intertransverse space was cleared of soft tissues. A curette was used to remove remnants of soft tissue and then a high-speed drill was used to remove the lateral aspect of the pars and the superior articular process. The undersurface of the lateral aspect was then examined. A 2 and 3 mm Kerrison punch was used to remove further bone from the lateral aspect of the pars to allow exploration of the exiting L5 nerve root. The nerve root was mobilized medially and further dissection far laterally required removal of the superior portion of the ala to mobilize the lateral portion of the nerve superiorly. On the far lateral area inferior to the nerve identified and a mass of the disc . The mass was incised with a #15 blade and  disc material was removed from this area. The nerve root was carefully protected and dissection about the nerve root was performed both from the inferior aspect of the nerve and the superior aspect of the nerve. A series of ball dissectors were used to facilitate removal and freeing of any loose disc material. In the end the nerve root was decompressed of the surrounding disc material and its path was free and clear into the extraforaminal zone. Hemostasis was obtained using small pledgets of Gelfoam and careful bipolar cautery.  When adequate hemostasis was achieved the endoscopic cannula was removed, the microscope was removed, and closure of the incision was undertaken with 3-0 Vicryl in the fascia and 3-0 Vicryl  in the subcuticular skin. Dermabond was used to cover the skin.Blood loss was 50 cc. The patient tolerated the procedure and was returned to the recovery room in stable  condition.

## 2014-03-16 NOTE — Anesthesia Procedure Notes (Signed)
Procedure Name: Intubation Date/Time: 03/16/2014 7:46 AM Performed by: MUELLER, THOMAS P Pre-anesthesia Checklist: Patient identified, Emergency Drugs available, Suction available, Patient being monitored and Timeout performed Patient Re-evaluated:Patient Re-evaluated prior to inductionOxygen Delivery Method: Circle system utilized Preoxygenation: Pre-oxygenation with 100% oxygen Intubation Type: IV induction Ventilation: Mask ventilation without difficulty and Oral airway inserted - appropriate to patient size Laryngoscope Size: Mac and 4 Grade View: Grade II Tube type: Oral Tube size: 7.5 mm Number of attempts: 1 Airway Equipment and Method: Stylet and LTA kit utilized Placement Confirmation: ETT inserted through vocal cords under direct vision,  positive ETCO2 and breath sounds checked- equal and bilateral Secured at: 21 cm Tube secured with: Tape Dental Injury: Teeth and Oropharynx as per pre-operative assessment      

## 2014-03-16 NOTE — Anesthesia Preprocedure Evaluation (Addendum)
Anesthesia Evaluation  Patient identified by MRN, date of birth, ID band Patient awake    Reviewed: Allergy & Precautions, H&P , NPO status , Patient's Chart, lab work & pertinent test results  History of Anesthesia Complications (+) PONV  Airway Mallampati: II TM Distance: >3 FB Neck ROM: Full    Dental  (+) Teeth Intact, Caps, Dental Advisory Given   Pulmonary          Cardiovascular hypertension, Pt. on medications + Valvular Problems/Murmurs MVP     Neuro/Psych Depression    GI/Hepatic hiatal hernia, GERD-  Medicated and Controlled,  Endo/Other  diabetes, Well Controlled, Type 2, Oral Hypoglycemic Agents  Renal/GU      Musculoskeletal   Abdominal   Peds  Hematology  (+) anemia ,   Anesthesia Other Findings   Reproductive/Obstetrics                          Anesthesia Physical Anesthesia Plan  ASA: II  Anesthesia Plan: General   Post-op Pain Management:    Induction: Intravenous  Airway Management Planned: Oral ETT  Additional Equipment:   Intra-op Plan:   Post-operative Plan: Extubation in OR  Informed Consent: I have reviewed the patients History and Physical, chart, labs and discussed the procedure including the risks, benefits and alternatives for the proposed anesthesia with the patient or authorized representative who has indicated his/her understanding and acceptance.   Dental advisory given  Plan Discussed with: CRNA, Anesthesiologist and Surgeon  Anesthesia Plan Comments:        Anesthesia Quick Evaluation

## 2014-03-16 NOTE — Discharge Summary (Signed)
Physician Discharge Summary  Patient ID: [name] MRN: [id number] DOB/AGE: [birth year] 78 y.o.  Admit date: 03/16/2014 Discharge date: 03/16/2014  Admission Diagnoses:Herniated nucleus pulposus extraforaminal L5-S1 right with L5 radiculopathy. Status post arthrodesis L1-L5  Discharge Diagnoses: Herniated nucleus pulposus L5-S1 right extraforaminal, status post arthrodesis L1-L5 right lumbar radiculopathy  Active Problems:   Herniated nucleus pulposus, L5-S1, right   Discharged Condition: good  Hospital Course: Patient tolerated surgery well  Consults: None  Significant Diagnostic Studies: None  Treatments: surgery: Metrix discectomy L5-S1 right, extraforaminal with operating microscope micro-dissection technique  Discharge Exam: Blood pressure 109/68, pulse 66, temperature 97.9 F (36.6 C), temperature source Oral, resp. rate 18, weight 58.968 kg (130 lb), SpO2 94.00%. Incision is clean and dry motor function is intact with patient having maintained ambulatory status.  Disposition:  discharge home Discharge Instructions   Call MD for:  redness, tenderness, or signs of infection (pain, swelling, redness, odor or green/yellow discharge around incision site)    Complete by:  As directed      Call MD for:  severe uncontrolled pain    Complete by:  As directed      Call MD for:  temperature >100.4    Complete by:  As directed      Diet - low sodium heart healthy    Complete by:  As directed      Discharge instructions    Complete by:  As directed   Okay to shower. Do not apply salves or appointments to incision. No heavy lifting with the upper extremities greater than 15 pounds. May resume driving when not requiring pain medication and patient feels comfortable with doing so.     Increase activity slowly    Complete by:  As directed             Medication List         clonazePAM 1 MG tablet  Commonly known as:  KLONOPIN  Take 1 mg by mouth at bedtime as needed for  anxiety.     diltiazem 240 MG 24 hr capsule  Commonly known as:  DILACOR XR  Take 1 capsule (240 mg total) by mouth daily.     fish oil-omega-3 fatty acids 1000 MG capsule  Take 1 g by mouth daily.     fluticasone 50 MCG/ACT nasal spray  Commonly known as:  FLONASE  Place 2 sprays into both nostrils daily as needed for allergies.     glucose blood test strip  Commonly known as:  ONETOUCH VERIO  Check 2 times daily. Use as instructed. DX: 250.00     HYDROcodone-acetaminophen 5-325 MG per tablet  Commonly known as:  NORCO/VICODIN  Take 1-2 tablets by mouth every 6 (six) hours as needed for moderate pain.     HYDROcodone-acetaminophen 5-325 MG per tablet  Commonly known as:  NORCO/VICODIN  Take 1-2 tablets by mouth every 6 (six) hours as needed for moderate pain.     ibuprofen 200 MG tablet  Commonly known as:  ADVIL,MOTRIN  Take 400 mg by mouth every 6 (six) hours as needed.     lisinopril-hydrochlorothiazide 10-12.5 MG per tablet  Commonly known as:  PRINZIDE,ZESTORETIC  Take 1 tablet by mouth daily.     metFORMIN 500 MG tablet  Commonly known as:  GLUCOPHAGE  Take 500-1,000 mg by mouth 2 (two) times daily with a meal. Takes 1000 mg in the morning and 500 mg at supper     methocarbamol 500 MG tablet  Commonly known  as:  ROBAXIN  Take 1 tablet (500 mg total) by mouth every 6 (six) hours as needed for muscle spasms.     ONETOUCH DELICA LANCETS FINE Misc  Check 2 times daily. Use as instructed. DX: 250.00     ranitidine 150 MG tablet  Commonly known as:  ZANTAC  Take 150 mg by mouth daily as needed for heartburn.     Vitamin D 1000 UNITS capsule  Take 1,000 Units by mouth daily.         SignedEarleen Newport 03/16/2014, 2:56 PM

## 2014-03-16 NOTE — Progress Notes (Signed)
Pt. discharged home accompanied by husband. Prescriptions and discharge instructions given with verbalization of understanding. Incision site on back with no s/s of infection - no swelling, redness, bleeding, and/or drainage noted. Pt. and family stated understanding of instructions given.

## 2014-03-16 NOTE — Plan of Care (Signed)
Problem: Consults Goal: Diagnosis - Spinal Surgery Outcome: Completed/Met Date Met:  03/16/14 Cervical Spine Fusion

## 2014-03-16 NOTE — Progress Notes (Signed)
Utilization Review Completed.Donne Anon T7/03/2014

## 2014-03-16 NOTE — H&P (Signed)
[name] is an 80 y.o. female.   Chief Complaint: Right buttock and leg pain HPI: Patient is a 80 year old individual who has had degenerative scoliosis from L1-L5 decompressed and fused several years ago. She is developed significant right buttock and leg pain. Attempts to intermittent epidural steroid injections have given very brief transient release. She now has evidence of an extraforaminal disc protrusion at L5-S1 in addition to some mild spondylitic disease. She is to undergo surgical decompression at L5-S1 on the right.  Past Medical History  Diagnosis Date  . Atrial enlargement, left   . Mitral regurgitation   . Mitral valve prolapse   . Palpitations   . Arthritis   . Depression   . GERD (gastroesophageal reflux disease)   . Allergy   . Heart murmur   . Hyperlipidemia   . Hypertension   . 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 PONV (postoperative nausea and vomiting)   . Diabetes mellitus without complication   . H/O hiatal hernia     Past Surgical History  Procedure Laterality Date  . Anterior posterior and enterocele repairs  09/21/2004    With uterosacral cardinal colposuspension, partial colpocleisis; Selinda Orion, MD  . Breast enhancement surgery  02/25/2002    Bilateral reduction and excision of accessory breast tissue underneath the left breast; Aretha Parrot., MD  . Tonsillectomy and adenoidectomy    . Abdominal hysterectomy    . Cataract extraction Bilateral   . Breast surgery  11    reduction  . Rotator cuff repair Bilateral     11.12  . Back surgery      Family History  Problem Relation Age of Onset  . Heart failure Father   . Arthritis Father   . Hyperlipidemia Father   . Heart disease Father   . Arthritis Mother   . Hyperlipidemia Mother   . Hypertension Mother    Social History:  reports that she has never smoked. She has never used smokeless tobacco. She reports that she drinks alcohol. She reports that she does not use illicit  drugs.  Allergies:  Allergies  Allergen Reactions  . Codeine     Facial swelling  . Cortisone     Insomnia, heart palpitations (po only)  . Sulfonamide Derivatives     As a child    Medications Prior to Admission  Medication Sig Dispense Refill  . Cholecalciferol (VITAMIN D) 1000 UNITS capsule Take 1,000 Units by mouth daily.        . clonazePAM (KLONOPIN) 1 MG tablet Take 1 mg by mouth at bedtime as needed for anxiety.       [name] diltiazem (DILACOR XR) 240 MG 24 hr capsule Take 1 capsule (240 mg total) by mouth daily.  90 capsule  3  . fish oil-omega-3 fatty acids 1000 MG capsule Take 1 g by mouth daily.        . fluticasone (FLONASE) 50 MCG/ACT nasal spray Place 2 sprays into both nostrils daily as needed for allergies.      [name] glucose blood (ONETOUCH VERIO) test strip Check 2 times daily. Use as instructed. DX: 250.00  100 each  3  . HYDROcodone-acetaminophen (NORCO/VICODIN) 5-325 MG per tablet Take 1-2 tablets by mouth every 6 (six) hours as needed for moderate pain.  40 tablet  0  . ibuprofen (ADVIL,MOTRIN) 200 MG tablet Take 400 mg by mouth every 6 (six) hours as needed.      [name] lisinopril-hydrochlorothiazide (PRINZIDE,ZESTORETIC) 10-12.5 MG per tablet Take  1 tablet by mouth daily.  90 tablet  3  . metFORMIN (GLUCOPHAGE) 500 MG tablet Take 500-1,000 mg by mouth 2 (two) times daily with a meal. Takes 1000 mg in the morning and 500 mg at supper      . ONETOUCH DELICA LANCETS FINE MISC Check 2 times daily. Use as instructed. DX: 250.00  100 each  3  . ranitidine (ZANTAC) 150 MG tablet Take 150 mg by mouth daily as needed for heartburn.        Results for orders placed during the hospital encounter of 03/16/14 (from the past 48 hour(s))  GLUCOSE, CAPILLARY     Status: Abnormal   Collection Time    03/16/14  6:15 AM      Result Value Ref Range   Glucose-Capillary 118 (*) 70 - 99 mg/dL   No results found.  Review of Systems  HENT: Negative.   Eyes: Negative.   Respiratory:  Negative.   Cardiovascular: Negative.   Gastrointestinal: Negative.   Genitourinary: Negative.   Musculoskeletal: Positive for back pain.  Skin: Negative.   Neurological: Positive for weakness.  Endo/Heme/Allergies: Negative.   Psychiatric/Behavioral: Negative.     Blood pressure 102/50, pulse 65, temperature 98 F (36.7 C), temperature source Oral, resp. rate 18, weight 58.968 kg (130 lb), SpO2 98.00%. Physical Exam  Constitutional: She is oriented to person, place, and time. She appears well-developed and well-nourished.  HENT:  Head: Normocephalic and atraumatic.  Eyes: Conjunctivae are normal. Pupils are equal, round, and reactive to light.  Neck: Neck supple.  Cardiovascular: Normal rate and regular rhythm.   Respiratory: Effort normal and breath sounds normal.  GI: Soft. Bowel sounds are normal.  Neurological: She is alert and oriented to person, place, and time.  Right gluteus weakness positive straight leg raising on right at 15.  Skin: Skin is warm and dry.  Psychiatric: She has a normal mood and affect. Her behavior is normal. Judgment and thought content normal.     Assessment/Plan Extraforaminal disc herniation L5-S1 right. Decompression via Metrix procedure.  Blia Totman J 03/16/2014, 7:28 AM

## 2014-03-16 NOTE — Transfer of Care (Signed)
Immediate Anesthesia Transfer of Care Note  Patient: [name]  Procedure(s) Performed: Procedure(s): LUMBAR FIVE-SACRAL ONE EXTRAFORAMINAL DISKECTOMY WITH METREX (N/A)  Patient Location: PACU  Anesthesia Type:General  Level of Consciousness: awake and patient cooperative  Airway & Oxygen Therapy: Patient Spontanous Breathing and Patient connected to nasal cannula oxygen  Post-op Assessment: Report given to PACU RN, Post -op Vital signs reviewed and stable and Patient moving all extremities X 4  Post vital signs: Reviewed and stable  Complications: No apparent anesthesia complications

## 2014-03-17 ENCOUNTER — Ambulatory Visit: Payer: Medicare Other | Admitting: Family Medicine

## 2014-03-19 ENCOUNTER — Encounter (HOSPITAL_COMMUNITY): Payer: Self-pay | Admitting: Neurological Surgery

## 2014-04-12 ENCOUNTER — Ambulatory Visit (INDEPENDENT_AMBULATORY_CARE_PROVIDER_SITE_OTHER): Payer: Medicare Other | Admitting: Physician Assistant

## 2014-04-12 ENCOUNTER — Encounter: Payer: Self-pay | Admitting: Physician Assistant

## 2014-04-12 VITALS — BP 122/68 | HR 75 | Temp 97.8°F | Resp 18 | Wt 135.0 lb

## 2014-04-12 DIAGNOSIS — N390 Urinary tract infection, site not specified: Secondary | ICD-10-CM

## 2014-04-12 DIAGNOSIS — R319 Hematuria, unspecified: Secondary | ICD-10-CM

## 2014-04-12 DIAGNOSIS — R300 Dysuria: Secondary | ICD-10-CM

## 2014-04-12 LAB — POCT URINALYSIS DIPSTICK
Bilirubin, UA: NEGATIVE
GLUCOSE UA: NEGATIVE
Ketones, UA: NEGATIVE
Nitrite, UA: NEGATIVE
PROTEIN UA: NEGATIVE
Spec Grav, UA: 1.01
UROBILINOGEN UA: 0.2
pH, UA: 5

## 2014-04-12 MED ORDER — CIPROFLOXACIN HCL 500 MG PO TABS: 500.0000 mg | ORAL_TABLET | Freq: Two times a day (BID) | ORAL | Status: DC

## 2014-04-12 NOTE — Progress Notes (Signed)
Subjective:    Patient ID: [name], female    DOB: 1933/12/30, 80 y.o.   MRN: [id number]  Dysuria  This is a new problem. The current episode started in the past 7 days (3 days). The problem occurs every urination. The problem has been gradually worsening. The quality of the pain is described as aching and burning. The pain is moderate. There has been no fever. There is no history of pyelonephritis. Associated symptoms include chills (but denies fevers), frequency and urgency. Pertinent negatives include no discharge, flank pain, hematuria, hesitancy, nausea, possible pregnancy, sweats or vomiting. [name] has tried increased fluids for the symptoms. The treatment provided no relief. There is no history of catheterization, kidney stones, a single kidney, urinary stasis or a urological procedure.      Review of Systems  Constitutional: Positive for chills (but denies fevers). Negative for fever.  Respiratory: Negative for shortness of breath.   Cardiovascular: Negative for chest pain.  Gastrointestinal: Negative for nausea, vomiting, abdominal pain and diarrhea.  Genitourinary: Positive for dysuria, urgency and frequency. Negative for hesitancy, hematuria and flank pain.  Neurological: Negative for syncope and headaches.  All other systems reviewed and are negative.     Past Medical History  Diagnosis Date  . Atrial enlargement, left   . Mitral regurgitation   . Mitral valve prolapse   . Palpitations   . Arthritis   . Depression   . GERD (gastroesophageal reflux disease)   . Allergy   . Heart murmur   . Hyperlipidemia   . Hypertension   . 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 PONV (postoperative nausea and vomiting)   . Diabetes mellitus without complication   . H/O hiatal hernia     History   Social History  . Marital Status: Married    Spouse Name: N/A    Number of Children: N/A  . Years of Education: N/A   Occupational History  . Retired    Social History Main Topics  .  Smoking status: Never Smoker   . Smokeless tobacco: Never Used  . Alcohol Use: Yes     Comment: one maybe every 3 mths  . Drug Use: No  . Sexual Activity: Not on file   Other Topics Concern  . Not on file   Social History Narrative   Married   Regular exercise    Past Surgical History  Procedure Laterality Date  . Anterior posterior and enterocele repairs  09/21/2004    With uterosacral cardinal colposuspension, partial colpocleisis; Selinda Orion, MD  . Breast enhancement surgery  02/25/2002    Bilateral reduction and excision of accessory breast tissue underneath the left breast; Aretha Parrot., MD  . Tonsillectomy and adenoidectomy    . Abdominal hysterectomy    . Cataract extraction Bilateral   . Breast surgery  11    reduction  . Rotator cuff repair Bilateral     11.12  . Back surgery    . Lumbar laminectomy/ decompression with met-rx N/A 03/16/2014    Procedure: LUMBAR FIVE-SACRAL ONE EXTRAFORAMINAL DISKECTOMY WITH METREX;  Surgeon: Kristeen Miss, MD;  Location: [facility];  Service: Neurosurgery;  Laterality: N/A;    Family History  Problem Relation Age of Onset  . Heart failure Father   . Arthritis Father   . Hyperlipidemia Father   . Heart disease Father   . Arthritis Mother   . Hyperlipidemia Mother   . Hypertension Mother     Allergies  Allergen Reactions  .  Codeine     Facial swelling  . Cortisone     Insomnia, heart palpitations (po only)  . Sulfonamide Derivatives     As a child    Current Outpatient Prescriptions on File Prior to Visit  Medication Sig Dispense Refill  . Cholecalciferol (VITAMIN D) 1000 UNITS capsule Take 1,000 Units by mouth daily.        . clonazePAM (KLONOPIN) 1 MG tablet Take 1 mg by mouth at bedtime as needed for anxiety.       [name] diltiazem (DILACOR XR) 240 MG 24 hr capsule Take 1 capsule (240 mg total) by mouth daily.  90 capsule  3  . fish oil-omega-3 fatty acids 1000 MG capsule Take 1 g by mouth daily.        .  fluticasone (FLONASE) 50 MCG/ACT nasal spray Place 2 sprays into both nostrils daily as needed for allergies.      [name] glucose blood (ONETOUCH VERIO) test strip Check 2 times daily. Use as instructed. DX: 250.00  100 each  3  . HYDROcodone-acetaminophen (NORCO/VICODIN) 5-325 MG per tablet Take 1-2 tablets by mouth every 6 (six) hours as needed for moderate pain.  40 tablet  0  . HYDROcodone-acetaminophen (NORCO/VICODIN) 5-325 MG per tablet Take 1-2 tablets by mouth every 6 (six) hours as needed for moderate pain.  60 tablet  0  . ibuprofen (ADVIL,MOTRIN) 200 MG tablet Take 400 mg by mouth every 6 (six) hours as needed.      [name] lisinopril-hydrochlorothiazide (PRINZIDE,ZESTORETIC) 10-12.5 MG per tablet Take 1 tablet by mouth daily.  90 tablet  3  . metFORMIN (GLUCOPHAGE) 500 MG tablet Take 500-1,000 mg by mouth 2 (two) times daily with a meal. Takes 1000 mg in the morning and 500 mg at supper      . methocarbamol (ROBAXIN) 500 MG tablet Take 1 tablet (500 mg total) by mouth every 6 (six) hours as needed for muscle spasms.  60 tablet  3  . ONETOUCH DELICA LANCETS FINE MISC Check 2 times daily. Use as instructed. DX: 250.00  100 each  3  . ranitidine (ZANTAC) 150 MG tablet Take 150 mg by mouth daily as needed for heartburn.      . [DISCONTINUED] fenofibrate (TRICOR) 145 MG tablet Take 1 tablet (145 mg total) by mouth daily.  90 tablet  3   No current facility-administered medications on file prior to visit.    EXAM: BP 122/68  Pulse 75  Temp(Src) 97.8 F (36.6 C) (Oral)  Resp 18  Wt 135 lb (61.236 kg)  SpO2 97%     Objective:   Physical Exam  Nursing note and vitals reviewed. Constitutional: [name] is oriented to person, place, and time. [name] appears well-developed and well-nourished. No distress.  HENT:  Head: Normocephalic and atraumatic.  Eyes: Conjunctivae and EOM are normal. Pupils are equal, round, and reactive to light.  Cardiovascular: Normal rate, regular rhythm and intact distal  pulses.   Pulmonary/Chest: Effort normal and breath sounds normal. No respiratory distress. [name] exhibits no tenderness.  No CVA tenderness.  Neurological: [name] is alert and oriented to person, place, and time.  Skin: Skin is warm and dry. No rash noted. [name] is not diaphoretic. No erythema. No pallor.  Psychiatric: [name] has a normal mood and affect. Her behavior is normal. Judgment and thought content normal.     Lab Results  Component Value Date   WBC 8.2 03/08/2014   HGB 13.9 03/08/2014   HCT 41.4 03/08/2014  PLT 396 03/08/2014   GLUCOSE 99 03/08/2014   CHOL 221* 06/04/2012   TRIG 190.0* 06/04/2012   HDL 42.70 06/04/2012   LDLDIRECT 143.6 06/04/2012   LDLCALC 110* 08/01/2011   ALT 17 08/01/2011   AST 20 08/01/2011   NA 137 03/08/2014   K 4.0 03/08/2014   CL 94* 03/08/2014   CREATININE 0.56 03/08/2014   BUN 16 03/08/2014   CO2 24 03/08/2014   TSH 0.42 01/08/2013   HGBA1C 6.3 12/07/2013        Assessment & Plan:  Brienne was seen today for dysuria.  Diagnoses and associated orders for this visit:  Hematuria - POC Urinalysis Dipstick - Culture, Urine  Dysuria Comments: UA shows hematuria and leukocytes. Will obtain culture.  Urinary tract infection, site not specified Comments: Will Treat with Cipro due to recent culture of enterococcus. increase fluids. - ciprofloxacin (CIPRO) 500 MG tablet; Take 1 tablet (500 mg total) by mouth 2 (two) times daily.    Return precautions provided, and patient handout on UTI.  Plan to follow up as needed, or for worsening or persistent symptoms despite treatment.  Patient Instructions  Ciprofloxacin twice daily for 7 days to treat UTI.  This medication is in the same family as the antibiotic that was able to successfully treat your UTI in June.  We will call with the results of the culture when they are available and if any change in therapy is indicated.  Continue to increase fluid hydration.  If emergency symptoms discussed during visit  developed, seek medical attention immediately.  Followup as needed, or for worsening or persistent symptoms despite treatment.

## 2014-04-12 NOTE — Patient Instructions (Addendum)
Ciprofloxacin twice daily for 7 days to treat UTI.  This medication is in the same family as the antibiotic that was able to successfully treat your UTI in June.  We will call with the results of the culture when they are available and if any change in therapy is indicated.  Continue to increase fluid hydration.  If emergency symptoms discussed during visit developed, seek medical attention immediately.  Followup as needed, or for worsening or persistent symptoms despite treatment.    Urinary Tract Infection A urinary tract infection (UTI) can occur any place along the urinary tract. The tract includes the kidneys, ureters, bladder, and urethra. A type of germ called bacteria often causes a UTI. UTIs are often helped with antibiotic medicine.  HOME CARE   If given, take antibiotics as told by your doctor. Finish them even if you start to feel better.  Drink enough fluids to keep your pee (urine) clear or pale yellow.  Avoid tea, drinks with caffeine, and bubbly (carbonated) drinks.  Pee often. Avoid holding your pee in for a long time.  Pee before and after having sex (intercourse).  Wipe from front to back after you poop (bowel movement) if you are a woman. Use each tissue only once. GET HELP RIGHT AWAY IF:   You have back pain.  You have lower belly (abdominal) pain.  You have chills.  You feel sick to your stomach (nauseous).  You throw up (vomit).  Your burning or discomfort with peeing does not go away.  You have a fever.  Your symptoms are not better in 3 days. MAKE SURE YOU:   Understand these instructions.  Will watch your condition.  Will get help right away if you are not doing well or get worse. Document Released: 02/13/2008 Document Revised: 05/21/2012 Document Reviewed: 03/27/2012 [facility] Patient Information 2015 [place], [place]. This information is not intended to replace advice given to you by your health care provider. Make sure you discuss any  questions you have with your health care provider.

## 2014-04-12 NOTE — Progress Notes (Signed)
Pre visit review using our clinic review tool, if applicable. No additional management support is needed unless otherwise documented below in the visit note. 

## 2014-04-16 LAB — URINE CULTURE

## 2014-04-22 ENCOUNTER — Telehealth: Payer: Self-pay | Admitting: Family Medicine

## 2014-04-22 MED ORDER — FLUCONAZOLE 150 MG PO TABS: 150.0000 mg | ORAL_TABLET | Freq: Once | ORAL | Status: DC

## 2014-04-22 NOTE — Telephone Encounter (Signed)
Pt has poss yeast inf due to abx from her uti on 8/3. Pt asked if you could send rx to [facility]/ [address] for this issue.

## 2014-04-22 NOTE — Telephone Encounter (Signed)
Fluconazole 150mg x 1 dose

## 2014-04-22 NOTE — Telephone Encounter (Signed)
RX sent to pharmacy  

## 2014-05-07 ENCOUNTER — Ambulatory Visit: Payer: Medicare Other | Admitting: Internal Medicine

## 2014-05-20 ENCOUNTER — Other Ambulatory Visit: Payer: Self-pay | Admitting: Neurological Surgery

## 2014-05-20 DIAGNOSIS — M5416 Radiculopathy, lumbar region: Secondary | ICD-10-CM

## 2014-05-21 ENCOUNTER — Ambulatory Visit (INDEPENDENT_AMBULATORY_CARE_PROVIDER_SITE_OTHER): Payer: Medicare Other | Admitting: Family Medicine

## 2014-05-21 ENCOUNTER — Encounter: Payer: Self-pay | Admitting: Family Medicine

## 2014-05-21 VITALS — BP 120/70 | HR 72 | Temp 97.7°F | Ht 66.0 in | Wt 133.0 lb

## 2014-05-21 DIAGNOSIS — I10 Essential (primary) hypertension: Secondary | ICD-10-CM

## 2014-05-21 DIAGNOSIS — N952 Postmenopausal atrophic vaginitis: Secondary | ICD-10-CM

## 2014-05-21 DIAGNOSIS — Z23 Encounter for immunization: Secondary | ICD-10-CM

## 2014-05-21 DIAGNOSIS — E119 Type 2 diabetes mellitus without complications: Secondary | ICD-10-CM

## 2014-05-21 DIAGNOSIS — Z0000 Encounter for general adult medical examination without abnormal findings: Secondary | ICD-10-CM

## 2014-05-21 DIAGNOSIS — K219 Gastro-esophageal reflux disease without esophagitis: Secondary | ICD-10-CM

## 2014-05-21 DIAGNOSIS — E785 Hyperlipidemia, unspecified: Secondary | ICD-10-CM

## 2014-05-21 LAB — LIPID PANEL
CHOL/HDL RATIO: 4
Cholesterol: 195 mg/dL (ref 0–200)
HDL: 52.8 mg/dL (ref >39.00)
LDL Cholesterol: 114 mg/dL — ABNORMAL HIGH (ref 0–99)
NONHDL: 142.2
Triglycerides: 141 mg/dL (ref 0.0–149.0)
VLDL: 28.2 mg/dL (ref 0.0–40.0)

## 2014-05-21 LAB — HEPATIC FUNCTION PANEL
ALBUMIN: 4.1 g/dL (ref 3.5–5.2)
ALK PHOS: 63 U/L (ref 39–117)
ALT: 13 U/L (ref 0–35)
AST: 18 U/L (ref 0–37)
BILIRUBIN DIRECT: 0 mg/dL (ref 0.0–0.3)
BILIRUBIN TOTAL: 0.4 mg/dL (ref 0.2–1.2)
Total Protein: 7.4 g/dL (ref 6.0–8.3)

## 2014-05-21 LAB — MICROALBUMIN / CREATININE URINE RATIO
CREATININE, U: 32.5 mg/dL
Microalb Creat Ratio: 0.6 mg/g (ref 0.0–30.0)
Microalb, Ur: 0.2 mg/dL (ref 0.0–1.9)

## 2014-05-21 LAB — BASIC METABOLIC PANEL
BUN: 10 mg/dL (ref 6–23)
CALCIUM: 9.6 mg/dL (ref 8.4–10.5)
CO2: 28 mEq/L (ref 19–32)
CREATININE: 0.6 mg/dL (ref 0.4–1.2)
Chloride: 98 mEq/L (ref 96–112)
GFR: 104.64 mL/min (ref >60.00)
GLUCOSE: 85 mg/dL (ref 70–99)
Potassium: 4.3 mEq/L (ref 3.5–5.1)
Sodium: 136 mEq/L (ref 135–145)

## 2014-05-21 LAB — HEMOGLOBIN A1C: Hgb A1c MFr Bld: 6.1 % (ref 4.6–6.5)

## 2014-05-21 MED ORDER — ESTRADIOL 0.1 MG/GM VA CREA: 1.0000 | TOPICAL_CREAM | VAGINAL | Status: DC

## 2014-05-21 MED ORDER — TRIAMCINOLONE ACETONIDE 0.1 % EX CREA: 1.0000 "application " | TOPICAL_CREAM | Freq: Two times a day (BID) | CUTANEOUS | Status: DC

## 2014-05-21 MED ORDER — PANTOPRAZOLE SODIUM 40 MG PO TBEC: 40.0000 mg | DELAYED_RELEASE_TABLET | Freq: Every day | ORAL | Status: DC

## 2014-05-21 NOTE — Progress Notes (Signed)
Subjective:    Patient ID: [name], female    DOB: June 19, 1934, 80 y.o.   MRN: [id number]  HPI Patient here for medical followup. She is also here for Medicare wellness exam. She's had previous breast reduction surgery. She had mammogram back in May which was normal. Colonoscopy 2013 with recommended three-year followup. Tetanus 2009. No history of shingles vaccine. She needs flu vaccine also Prevnar 13. She is coping with the loss of her son this past year cancer but is doing fairly well. She has had some recent back issues and is being prepared for repeat lumbar back surgery soon  GERD not controlled with Zantac. No dysphagia. No appetite or weight changes. Recurrent atrophic vaginitis. Requesting refills Estrace cream previously provided by gynecologist. Also uses triamcinolone for itching and requesting refills.  Past Medical History  Diagnosis Date  . Atrial enlargement, left   . Mitral regurgitation   . Mitral valve prolapse   . Palpitations   . Arthritis   . Depression   . GERD (gastroesophageal reflux disease)   . Allergy   . Heart murmur   . Hyperlipidemia   . Hypertension   . 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 PONV (postoperative nausea and vomiting)   . Diabetes mellitus without complication   . H/O hiatal hernia    Past Surgical History  Procedure Laterality Date  . Anterior posterior and enterocele repairs  09/21/2004    With uterosacral cardinal colposuspension, partial colpocleisis; Selinda Orion, MD  . Breast enhancement surgery  02/25/2002    Bilateral reduction and excision of accessory breast tissue underneath the left breast; Aretha Parrot., MD  . Tonsillectomy and adenoidectomy    . Abdominal hysterectomy    . Cataract extraction Bilateral   . Breast surgery  11    reduction  . Rotator cuff repair Bilateral     11.12  . Back surgery    . Lumbar laminectomy/ decompression with met-rx N/A 03/16/2014    Procedure: LUMBAR FIVE-SACRAL ONE EXTRAFORAMINAL  DISKECTOMY WITH METREX;  Surgeon: Kristeen Miss, MD;  Location: [facility];  Service: Neurosurgery;  Laterality: N/A;    reports that she has never smoked. She has never used smokeless tobacco. She reports that she drinks alcohol. She reports that she does not use illicit drugs. family history includes Arthritis in her father and mother; Heart disease in her father; Heart failure in her father; Hyperlipidemia in her father and mother; Hypertension in her mother. Allergies  Allergen Reactions  . Codeine     Facial swelling  . Cortisone     Insomnia, heart palpitations (po only)  . Sulfonamide Derivatives     As a child   1.  Risk factors based on Past Medical , Social, and Family history reviewed and as indicated above with no changes 2.  Limitations in physical activities None.  No recent falls. 3.  Depression/mood No active depression or anxiety issues 4.  Hearing No defiits 5.  ADLs independent in all. 6.  Cognitive function (orientation to time and place, language, writing, speech,memory) no short or long term memory issues.  Language and judgement intact. 7.  Home Safety no issues 8.  Height, weight, and visual acuity.all stable. 9.  Counseling discussed weight bearing exercise and adequate Vit D and calcium. 10. Recommendation of preventive services. prevnar 13 and flu vaccine.  Check on Shingles vaccine coverage. 11. Labs based on risk factors-lipid, A1C, hepatic, BMP. 12. Care Plan as above 13. Other Providers Dr  Elsner -neurosurgery. 14. Written schedule of screening/prevention services given to patient.    Review of Systems  Constitutional: Negative for fever, activity change, appetite change, fatigue and unexpected weight change.  HENT: Negative for ear pain, hearing loss, sore throat and trouble swallowing.   Eyes: Negative for visual disturbance.  Respiratory: Negative for cough and shortness of breath.   Cardiovascular: Negative for chest pain and palpitations.    Gastrointestinal: Negative for nausea, vomiting, abdominal pain, diarrhea, constipation and blood in stool.       Frequent reflux symptoms  Genitourinary: Negative for dysuria, hematuria, vaginal discharge and vaginal pain.  Musculoskeletal: Positive for back pain. Negative for arthralgias and myalgias.  Skin: Negative for rash.  Neurological: Negative for dizziness, syncope and headaches.  Hematological: Negative for adenopathy.  Psychiatric/Behavioral: Negative for confusion and dysphoric mood.       Objective:   Physical Exam  Constitutional: She is oriented to person, place, and time. She appears well-developed and well-nourished.  HENT:  Head: Normocephalic and atraumatic.  Eyes: EOM are normal. Pupils are equal, round, and reactive to light.  Neck: Normal range of motion. Neck supple. No thyromegaly present.  Cardiovascular: Normal rate, regular rhythm and normal heart sounds.   No murmur heard. Pulmonary/Chest: Breath sounds normal. No respiratory distress. She has no wheezes. She has no rales.  Abdominal: Soft. Bowel sounds are normal. She exhibits no distension and no mass. There is no tenderness. There is no rebound and no guarding.  Genitourinary:  Previous hysterectomy and also age 54 so no indication for a Pap smear  Musculoskeletal: Normal range of motion. She exhibits no edema.  Lymphadenopathy:    She has no cervical adenopathy.  Neurological: She is alert and oriented to person, place, and time. She displays normal reflexes. No cranial nerve deficit.  Skin: No rash noted.  Psychiatric: She has a normal mood and affect. Her behavior is normal. Judgment and thought content normal.          Assessment & Plan:  #1 complete physical. She will check on coverage for shingles vaccine. Flu vaccine and Prevnar 13 given.  No indication for Pap smear (age). Continue regular mammogram. Colonoscopy repeat by next year #2 GERD. Not well controlled. Start back Protonix 40 mg  once daily. Supplement with Zantac as needed #3 atrophic vaginitis. Refill Estrace cream. #4 hypertension which is stable and at goal #5 type 2 diabetes. Recheck A1c. Check urine microalbumin screen. Continue yearly eye exam.

## 2014-05-21 NOTE — Progress Notes (Signed)
Pre visit review using our clinic review tool, if applicable. No additional management support is needed unless otherwise documented below in the visit note. 

## 2014-05-21 NOTE — Patient Instructions (Signed)
Continue yearly flu vaccine Continue yearly mammogram. Tetanus shot every 10 years. Next one due 2019 Repeat colonoscopy done next year.

## 2014-05-25 ENCOUNTER — Ambulatory Visit
Admission: RE | Admit: 2014-05-25 | Discharge: 2014-05-25 | Disposition: A | Discharge status: Home / Self Care | Payer: Medicare Other | Source: Ambulatory Visit | Attending: Neurological Surgery | Admitting: Neurological Surgery

## 2014-05-25 DIAGNOSIS — M5416 Radiculopathy, lumbar region: Secondary | ICD-10-CM

## 2014-05-31 ENCOUNTER — Other Ambulatory Visit: Payer: Self-pay

## 2014-05-31 MED ORDER — DILTIAZEM HCL ER 240 MG PO CP24: 240.0000 mg | ORAL_CAPSULE | Freq: Every day | ORAL | Status: DC

## 2014-06-01 ENCOUNTER — Other Ambulatory Visit: Payer: Self-pay | Admitting: Neurological Surgery

## 2014-06-03 ENCOUNTER — Encounter (HOSPITAL_COMMUNITY): Payer: Self-pay

## 2014-06-04 ENCOUNTER — Encounter (HOSPITAL_COMMUNITY)
Admission: RE | Admit: 2014-06-04 | Discharge: 2014-06-04 | Disposition: A | Discharge status: Home / Self Care | Payer: Medicare Other | Source: Ambulatory Visit | Attending: Neurological Surgery | Admitting: Neurological Surgery

## 2014-06-04 ENCOUNTER — Encounter (HOSPITAL_COMMUNITY): Payer: Self-pay

## 2014-06-04 DIAGNOSIS — Z981 Arthrodesis status: Secondary | ICD-10-CM | POA: Insufficient documentation

## 2014-06-04 DIAGNOSIS — Z01812 Encounter for preprocedural laboratory examination: Secondary | ICD-10-CM | POA: Insufficient documentation

## 2014-06-04 DIAGNOSIS — M5126 Other intervertebral disc displacement, lumbar region: Secondary | ICD-10-CM | POA: Insufficient documentation

## 2014-06-04 HISTORY — DX: Headache: R51

## 2014-06-04 HISTORY — DX: Anxiety disorder, unspecified: F41.9

## 2014-06-04 LAB — BASIC METABOLIC PANEL
Anion gap: 18 — ABNORMAL HIGH (ref 5–15)
BUN: 16 mg/dL (ref 6–23)
CALCIUM: 9.5 mg/dL (ref 8.4–10.5)
CO2: 21 mEq/L (ref 19–32)
Chloride: 95 mEq/L — ABNORMAL LOW (ref 96–112)
Creatinine, Ser: 0.57 mg/dL (ref 0.50–1.10)
GFR, EST NON AFRICAN AMERICAN: 87 mL/min — AB (ref >90)
GLUCOSE: 107 mg/dL — AB (ref 70–99)
POTASSIUM: 3.9 meq/L (ref 3.7–5.3)
Sodium: 134 mEq/L — ABNORMAL LOW (ref 137–147)

## 2014-06-04 LAB — CBC
HCT: 41.3 % (ref 36.0–46.0)
HEMOGLOBIN: 13.6 g/dL (ref 12.0–15.0)
MCH: 31.6 pg (ref 26.0–34.0)
MCHC: 32.9 g/dL (ref 30.0–36.0)
MCV: 95.8 fL (ref 78.0–100.0)
Platelets: 371 10*3/uL (ref 150–400)
RBC: 4.31 MIL/uL (ref 3.87–5.11)
RDW: 13.1 % (ref 11.5–15.5)
WBC: 6.6 10*3/uL (ref 4.0–10.5)

## 2014-06-04 LAB — SURGICAL PCR SCREEN
MRSA, PCR: NEGATIVE
STAPHYLOCOCCUS AUREUS: NEGATIVE

## 2014-06-04 NOTE — Pre-Procedure Instructions (Signed)
[name]  06/04/2014   Your procedure is scheduled on: Tuesday, June 08, 2014  Report to [facility] Admitting at 11:45 AM.( per MD )  Call this number if you have problems the morning of surgery: [phone]   Remember:    Do not eat food or drink liquids after midnight Monday, June 07, 2014   Take these medicines the morning of surgery with A SIP OF WATER: diltiazem (DILACOR XR), pantoprazole (PROTONIX), if needed: clonazePAM (KLONOPIN) for anxiety, HYDROcodone-for pain fluticasone (FLONASE) nasal spray for allergies, DO NOT TAKE ANY DIABETIC MEDICATION ON THE DAY OF SURGERY  Stop taking Aspirin, vitamins, and herbal medications (fish oil-omega-3 fatty acids) Do not take any NSAIDs ie: Ibuprofen, Advil, Naproxen or any medication containing Aspirin.   Do not wear jewelry, make-up or nail polish.  Do not wear lotions, powders, or perfumes. You may not wear deodorant.  Do not shave 48 hours prior to surgery.   Do not bring valuables to the hospital.  [facility] is not responsible for any belongings or valuables.               Contacts, dentures or bridgework may not be worn into surgery.  Leave suitcase in the car. After surgery it may be brought to your room.  For patients admitted to the hospital, discharge time is determined by your treatment team.               Patients discharged the day of surgery will not be allowed to drive home.  Name and phone number of your driver:   Special Instructions:  Special Instructions:Special Instructions: [facility] - Preparing for Surgery  Before surgery, you can play an important role.  Because skin is not sterile, your skin needs to be as free of germs as possible.  You can reduce the number of germs on you skin by washing with CHG (chlorahexidine gluconate) soap before surgery.  CHG is an antiseptic cleaner which kills germs and bonds with the skin to continue killing germs even after washing.  Please DO NOT use if  you have an allergy to CHG or antibacterial soaps.  If your skin becomes reddened/irritated stop using the CHG and inform your nurse when you arrive at Short Stay.  Do not shave (including legs and underarms) for at least 48 hours prior to the first CHG shower.  You may shave your face.  Please follow these instructions carefully:   1.  Shower with CHG Soap the night before surgery and the morning of Surgery.  2.  If you choose to wash your hair, wash your hair first as usual with your normal shampoo.  3.  After you shampoo, rinse your hair and body thoroughly to remove the Shampoo.  4.  Use CHG as you would any other liquid soap.  You can apply chg directly  to the skin and wash gently with scrungie or a clean washcloth.  5.  Apply the CHG Soap to your body ONLY FROM THE NECK DOWN.  Do not use on open wounds or open sores.  Avoid contact with your eyes, ears, mouth and genitals (private parts).  Wash genitals (private parts) with your normal soap.  6.  Wash thoroughly, paying special attention to the area where your surgery will be performed.  7.  Thoroughly rinse your body with warm water from the neck down.  8.  DO NOT shower/wash with your normal soap after using and rinsing off the CHG Soap.  9.  Pat yourself dry with a clean towel.            10.  Wear clean pajamas.            11.  Place clean sheets on your bed the night of your first shower and do not sleep with pets.  Day of Surgery  Do not apply any lotions/deodorants the morning of surgery.  Please wear clean clothes to the hospital/surgery center.   Please read over the following fact sheets that you were given: Pain Booklet, Coughing and Deep Breathing, MRSA Information and Surgical Site Infection Prevention

## 2014-06-04 NOTE — Progress Notes (Signed)
Primary - dr. Yvette Rack Cardiologist - meeting new cardiologist dr. Dorris Carnes on October 1st (has been seeing dr. Verl Blalock in past) ekg in epic from July 2015

## 2014-06-04 NOTE — Progress Notes (Signed)
Spoke with Janett Billow, surgical scheduler for Dr. Ellene Route to make MD aware that orders are not signed.

## 2014-06-07 NOTE — Progress Notes (Signed)
Anesthesia Chart Review: Patient is a 80 year old female posted for left L5-S1 microdiscectomy on 06/08/14 by Dr. Ellene Route.  She is s/p right L5-S1 extraforaminal diskectomy with Metrex on 03/16/14. I reviewed her chart with one of our anesthesiologists at that time.   History includes non-smoker, post-operative N/V, mild MR with bileaflet MVP and mild left atrial enlargement (02/2011 echo), palpitations, HLD, DM2, HTN, GERD, hiatal hernia, positive PPD, depression, arthritis, breast augmentation, T&A, back surgery. PCP is listed as Dr. Carolann Littler, last visit 05/21/14 and is aware of plans for upcoming repeat back surgery. Former cardiologist was Dr. Jenell Milliner (last visit 04/2013 with one year follow-up recommended), with appointment to get established with Dr. Dorris Carnes scheduled for 06/10/14.     EKG on 03/16/13 showed: SR with first degree AVB, septal infarct (cited on or before 2013-05-28), non-specific inferolateral ST abnormality--less pronounced since prior EKG on May 28, 2013.   Echo on 02/19/11 showed:  - Left ventricle: The cavity size was normal. Wall thickness was normal. Systolic function was normal. The estimated ejection fraction was in the range of 55% to 60%. - Mitral valve: Marked myxomatous changes and thickening of both mitral leaflet tips especially the ventricular surface. Mild MR and bileaflet prolapse Valve area by pressure half-time: 2.14cm^2. Valve area by continuity equation (using LVOT flow): 1.79cm^2. - Left atrium: The atrium was mildly dilated. - Atrial septum: No defect or patent foramen ovale was identified.  - Tricuspid valve: Mild regurgitation.   Holter monitor in 02/2011 showed NSR, no significant bradycardia.  CXR on 03/08/14 showed: No acute cardiopulmonary abnormality seen.   Preoperative labs noted. A1C on 05/21/14 was 6.1.   She tolerated similar procedure less than three months ago.  If no acute changes then I would anticipate that she could proceed as planned.    George Hugh [facility] Short Stay Center/Anesthesiology Phone [phone] 06/07/2014 9:20 AM

## 2014-06-08 ENCOUNTER — Encounter (HOSPITAL_COMMUNITY)
Admission: RE | Disposition: A | Discharge status: Home / Self Care | Payer: Self-pay | Source: Ambulatory Visit | Attending: Neurological Surgery

## 2014-06-08 ENCOUNTER — Ambulatory Visit (HOSPITAL_COMMUNITY): Payer: Medicare Other

## 2014-06-08 ENCOUNTER — Encounter (HOSPITAL_COMMUNITY): Payer: Medicare Other | Admitting: Vascular Surgery

## 2014-06-08 ENCOUNTER — Encounter (HOSPITAL_COMMUNITY): Payer: Self-pay | Admitting: *Deleted

## 2014-06-08 ENCOUNTER — Observation Stay (HOSPITAL_COMMUNITY)
Admission: RE | Admit: 2014-06-08 | Discharge: 2014-06-09 | Disposition: A | Discharge status: Home / Self Care | Payer: Medicare Other | Source: Ambulatory Visit | Attending: Neurological Surgery | Admitting: Neurological Surgery

## 2014-06-08 ENCOUNTER — Ambulatory Visit (HOSPITAL_COMMUNITY): Payer: Medicare Other | Admitting: Anesthesiology

## 2014-06-08 DIAGNOSIS — I10 Essential (primary) hypertension: Secondary | ICD-10-CM | POA: Diagnosis not present

## 2014-06-08 DIAGNOSIS — F411 Generalized anxiety disorder: Secondary | ICD-10-CM | POA: Diagnosis not present

## 2014-06-08 DIAGNOSIS — I059 Rheumatic mitral valve disease, unspecified: Secondary | ICD-10-CM | POA: Diagnosis not present

## 2014-06-08 DIAGNOSIS — E669 Obesity, unspecified: Secondary | ICD-10-CM | POA: Insufficient documentation

## 2014-06-08 DIAGNOSIS — M5126 Other intervertebral disc displacement, lumbar region: Secondary | ICD-10-CM | POA: Diagnosis not present

## 2014-06-08 DIAGNOSIS — M4180 Other forms of scoliosis, site unspecified: Secondary | ICD-10-CM | POA: Diagnosis not present

## 2014-06-08 DIAGNOSIS — K219 Gastro-esophageal reflux disease without esophagitis: Secondary | ICD-10-CM | POA: Diagnosis not present

## 2014-06-08 DIAGNOSIS — E785 Hyperlipidemia, unspecified: Secondary | ICD-10-CM | POA: Diagnosis not present

## 2014-06-08 DIAGNOSIS — M5127 Other intervertebral disc displacement, lumbosacral region: Secondary | ICD-10-CM | POA: Diagnosis present

## 2014-06-08 DIAGNOSIS — IMO0002 Reserved for concepts with insufficient information to code with codable children: Secondary | ICD-10-CM | POA: Insufficient documentation

## 2014-06-08 DIAGNOSIS — E119 Type 2 diabetes mellitus without complications: Secondary | ICD-10-CM | POA: Diagnosis not present

## 2014-06-08 DIAGNOSIS — Z981 Arthrodesis status: Secondary | ICD-10-CM | POA: Insufficient documentation

## 2014-06-08 HISTORY — PX: LUMBAR LAMINECTOMY/DECOMPRESSION MICRODISCECTOMY: SHX5026

## 2014-06-08 LAB — GLUCOSE, CAPILLARY
Glucose-Capillary: 97 mg/dL (ref 70–99)
Glucose-Capillary: 141 mg/dL — ABNORMAL HIGH (ref 70–99)
Glucose-Capillary: 163 mg/dL — ABNORMAL HIGH (ref 70–99)

## 2014-06-08 SURGERY — LUMBAR LAMINECTOMY/DECOMPRESSION MICRODISCECTOMY 1 LEVEL
Anesthesia: General | Site: Back | Laterality: Left

## 2014-06-08 MED ORDER — ONDANSETRON HCL 4 MG/2ML IJ SOLN: 4.0000 mg | INTRAMUSCULAR | Status: DC | PRN

## 2014-06-08 MED ORDER — GLYCOPYRROLATE 0.2 MG/ML IJ SOLN
INTRAMUSCULAR | Status: AC
  Filled 2014-06-08: qty 3

## 2014-06-08 MED ORDER — LISINOPRIL-HYDROCHLOROTHIAZIDE 10-12.5 MG PO TABS: 1.0000 | ORAL_TABLET | Freq: Every day | ORAL | Status: DC

## 2014-06-08 MED ORDER — LIDOCAINE HCL (CARDIAC) 20 MG/ML IV SOLN
INTRAVENOUS | Status: AC
  Filled 2014-06-08: qty 5

## 2014-06-08 MED ORDER — CEFAZOLIN SODIUM-DEXTROSE 2-3 GM-% IV SOLR
INTRAVENOUS | Status: AC
  Filled 2014-06-08: qty 50

## 2014-06-08 MED ORDER — FLUTICASONE PROPIONATE 50 MCG/ACT NA SUSP: 2.0000 | Freq: Every day | NASAL | Status: DC | PRN

## 2014-06-08 MED ORDER — EPHEDRINE SULFATE 50 MG/ML IJ SOLN
INTRAMUSCULAR | Status: AC
  Filled 2014-06-08: qty 1

## 2014-06-08 MED ORDER — HYDROMORPHONE HCL 1 MG/ML IJ SOLN: 0.2500 mg | INTRAMUSCULAR | Status: DC | PRN

## 2014-06-08 MED ORDER — 0.9 % SODIUM CHLORIDE (POUR BTL) OPTIME
TOPICAL | Status: DC | PRN
  Administered 2014-06-08: 1000 mL

## 2014-06-08 MED ORDER — MIDAZOLAM HCL 5 MG/5ML IJ SOLN
INTRAMUSCULAR | Status: DC | PRN
  Administered 2014-06-08: 1 mg via INTRAVENOUS

## 2014-06-08 MED ORDER — PHENOL 1.4 % MT LIQD: 1.0000 | OROMUCOSAL | Status: DC | PRN

## 2014-06-08 MED ORDER — LACTATED RINGERS IV SOLN
INTRAVENOUS | Status: DC
  Administered 2014-06-08: 50 mL/h via INTRAVENOUS

## 2014-06-08 MED ORDER — ACETAMINOPHEN 325 MG PO TABS: 650.0000 mg | ORAL_TABLET | ORAL | Status: DC | PRN

## 2014-06-08 MED ORDER — SODIUM CHLORIDE 0.9 % IR SOLN
Status: DC | PRN
  Administered 2014-06-08: 16:00:00

## 2014-06-08 MED ORDER — ARTIFICIAL TEARS OP OINT
TOPICAL_OINTMENT | OPHTHALMIC | Status: AC
  Filled 2014-06-08: qty 3.5

## 2014-06-08 MED ORDER — LIDOCAINE-EPINEPHRINE 1 %-1:100000 IJ SOLN
INTRAMUSCULAR | Status: DC | PRN
  Administered 2014-06-08: 5 mL

## 2014-06-08 MED ORDER — LIDOCAINE HCL (CARDIAC) 20 MG/ML IV SOLN
INTRAVENOUS | Status: DC | PRN
  Administered 2014-06-08: 20 mg via INTRAVENOUS

## 2014-06-08 MED ORDER — DILTIAZEM HCL ER 240 MG PO CP24
240.0000 mg | ORAL_CAPSULE | Freq: Every day | ORAL | Status: DC
  Filled 2014-06-08: qty 1

## 2014-06-08 MED ORDER — PANTOPRAZOLE SODIUM 40 MG PO TBEC
40.0000 mg | DELAYED_RELEASE_TABLET | Freq: Every day | ORAL | Status: DC
  Administered 2014-06-09: 40 mg via ORAL
  Filled 2014-06-08: qty 1

## 2014-06-08 MED ORDER — METFORMIN HCL 500 MG PO TABS
500.0000 mg | ORAL_TABLET | Freq: Every day | ORAL | Status: DC
  Administered 2014-06-08: 500 mg via ORAL
  Filled 2014-06-08 (×2): qty 1

## 2014-06-08 MED ORDER — ROCURONIUM BROMIDE 50 MG/5ML IV SOLN
INTRAVENOUS | Status: AC
  Filled 2014-06-08: qty 1

## 2014-06-08 MED ORDER — PROPOFOL 10 MG/ML IV BOLUS
INTRAVENOUS | Status: AC
  Filled 2014-06-08: qty 20

## 2014-06-08 MED ORDER — LISINOPRIL 10 MG PO TABS
10.0000 mg | ORAL_TABLET | Freq: Every day | ORAL | Status: DC
  Filled 2014-06-08: qty 1

## 2014-06-08 MED ORDER — SODIUM CHLORIDE 0.9 % IJ SOLN: 3.0000 mL | INTRAMUSCULAR | Status: DC | PRN

## 2014-06-08 MED ORDER — SCOPOLAMINE 1 MG/3DAYS TD PT72
1.0000 | MEDICATED_PATCH | Freq: Once | TRANSDERMAL | Status: DC
  Administered 2014-06-08: 1.5 mg via TRANSDERMAL
  Filled 2014-06-08: qty 1

## 2014-06-08 MED ORDER — FENTANYL CITRATE 0.05 MG/ML IJ SOLN
INTRAMUSCULAR | Status: AC
  Filled 2014-06-08: qty 5

## 2014-06-08 MED ORDER — LACTATED RINGERS IV SOLN
INTRAVENOUS | Status: DC | PRN
  Administered 2014-06-08 (×2): via INTRAVENOUS

## 2014-06-08 MED ORDER — ONDANSETRON HCL 4 MG/2ML IJ SOLN
INTRAMUSCULAR | Status: DC | PRN
  Administered 2014-06-08: 4 mg via INTRAVENOUS

## 2014-06-08 MED ORDER — PROMETHAZINE HCL 25 MG/ML IJ SOLN: 6.2500 mg | INTRAMUSCULAR | Status: DC | PRN

## 2014-06-08 MED ORDER — BUPIVACAINE HCL (PF) 0.5 % IJ SOLN
INTRAMUSCULAR | Status: DC | PRN
  Administered 2014-06-08: 5 mL
  Administered 2014-06-08: 10 mL

## 2014-06-08 MED ORDER — THROMBIN 5000 UNITS EX SOLR
CUTANEOUS | Status: DC | PRN
  Administered 2014-06-08 (×2): 5000 [IU] via TOPICAL

## 2014-06-08 MED ORDER — HYDROCODONE-ACETAMINOPHEN 5-325 MG PO TABS: 1.0000 | ORAL_TABLET | Freq: Four times a day (QID) | ORAL | Status: DC | PRN

## 2014-06-08 MED ORDER — MORPHINE SULFATE 2 MG/ML IJ SOLN: 1.0000 mg | INTRAMUSCULAR | Status: DC | PRN

## 2014-06-08 MED ORDER — METFORMIN HCL 500 MG PO TABS
1000.0000 mg | ORAL_TABLET | Freq: Every day | ORAL | Status: DC
  Administered 2014-06-09: 1000 mg via ORAL
  Filled 2014-06-08 (×2): qty 2

## 2014-06-08 MED ORDER — HEMOSTATIC AGENTS (NO CHARGE) OPTIME
TOPICAL | Status: DC | PRN
  Administered 2014-06-08: 1 via TOPICAL

## 2014-06-08 MED ORDER — STERILE WATER FOR INJECTION IJ SOLN
INTRAMUSCULAR | Status: AC
  Filled 2014-06-08: qty 10

## 2014-06-08 MED ORDER — METHOCARBAMOL 500 MG PO TABS: 500.0000 mg | ORAL_TABLET | Freq: Four times a day (QID) | ORAL | Status: DC | PRN

## 2014-06-08 MED ORDER — EPHEDRINE SULFATE 50 MG/ML IJ SOLN
INTRAMUSCULAR | Status: DC | PRN
  Administered 2014-06-08: 5 mg via INTRAVENOUS
  Administered 2014-06-08: 10 mg via INTRAVENOUS

## 2014-06-08 MED ORDER — ROCURONIUM BROMIDE 100 MG/10ML IV SOLN
INTRAVENOUS | Status: DC | PRN
  Administered 2014-06-08: 35 mg via INTRAVENOUS
  Administered 2014-06-08: 15 mg via INTRAVENOUS

## 2014-06-08 MED ORDER — PROPOFOL 10 MG/ML IV BOLUS
INTRAVENOUS | Status: DC | PRN
  Administered 2014-06-08: 80 mg via INTRAVENOUS

## 2014-06-08 MED ORDER — ACETAMINOPHEN 650 MG RE SUPP: 650.0000 mg | RECTAL | Status: DC | PRN

## 2014-06-08 MED ORDER — MIDAZOLAM HCL 2 MG/2ML IJ SOLN
INTRAMUSCULAR | Status: AC
  Filled 2014-06-08: qty 2

## 2014-06-08 MED ORDER — EPHEDRINE SULFATE 50 MG/ML IJ SOLN: INTRAMUSCULAR | Status: DC | PRN

## 2014-06-08 MED ORDER — DIPHENHYDRAMINE HCL 50 MG/ML IJ SOLN
INTRAMUSCULAR | Status: AC
  Filled 2014-06-08: qty 1

## 2014-06-08 MED ORDER — GLYCOPYRROLATE 0.2 MG/ML IJ SOLN
INTRAMUSCULAR | Status: DC | PRN
  Administered 2014-06-08: 0.4 mg via INTRAVENOUS

## 2014-06-08 MED ORDER — FENTANYL CITRATE 0.05 MG/ML IJ SOLN
INTRAMUSCULAR | Status: DC | PRN
  Administered 2014-06-08: 50 ug via INTRAVENOUS
  Administered 2014-06-08: 100 ug via INTRAVENOUS
  Administered 2014-06-08 (×2): 50 ug via INTRAVENOUS

## 2014-06-08 MED ORDER — MEPERIDINE HCL 25 MG/ML IJ SOLN: 6.2500 mg | INTRAMUSCULAR | Status: DC | PRN

## 2014-06-08 MED ORDER — MENTHOL 3 MG MT LOZG: 1.0000 | LOZENGE | OROMUCOSAL | Status: DC | PRN

## 2014-06-08 MED ORDER — DIPHENHYDRAMINE HCL 50 MG/ML IJ SOLN
10.0000 mg | Freq: Once | INTRAMUSCULAR | Status: AC
  Administered 2014-06-08: 10 mg via INTRAVENOUS

## 2014-06-08 MED ORDER — SODIUM CHLORIDE 0.9 % IJ SOLN
3.0000 mL | Freq: Two times a day (BID) | INTRAMUSCULAR | Status: DC
  Administered 2014-06-08: 3 mL via INTRAVENOUS

## 2014-06-08 MED ORDER — NEOSTIGMINE METHYLSULFATE 10 MG/10ML IV SOLN
INTRAVENOUS | Status: DC | PRN
  Administered 2014-06-08: 3 mg via INTRAVENOUS

## 2014-06-08 MED ORDER — OXYCODONE HCL 5 MG PO TABS: 5.0000 mg | ORAL_TABLET | Freq: Once | ORAL | Status: DC | PRN

## 2014-06-08 MED ORDER — DEXAMETHASONE SODIUM PHOSPHATE 4 MG/ML IJ SOLN
INTRAMUSCULAR | Status: AC
  Filled 2014-06-08: qty 1

## 2014-06-08 MED ORDER — DEXAMETHASONE SODIUM PHOSPHATE 4 MG/ML IJ SOLN
INTRAMUSCULAR | Status: DC | PRN
  Administered 2014-06-08: 4 mg via INTRAVENOUS

## 2014-06-08 MED ORDER — CLONAZEPAM 0.5 MG PO TABS
1.0000 mg | ORAL_TABLET | Freq: Every evening | ORAL | Status: DC | PRN
  Administered 2014-06-08: 1 mg via ORAL
  Filled 2014-06-08: qty 2

## 2014-06-08 MED ORDER — NEOSTIGMINE METHYLSULFATE 10 MG/10ML IV SOLN
INTRAVENOUS | Status: AC
  Filled 2014-06-08: qty 2

## 2014-06-08 MED ORDER — ARTIFICIAL TEARS OP OINT
TOPICAL_OINTMENT | OPHTHALMIC | Status: DC | PRN
  Administered 2014-06-08: 1 via OPHTHALMIC

## 2014-06-08 MED ORDER — KETOROLAC TROMETHAMINE 15 MG/ML IJ SOLN
15.0000 mg | Freq: Four times a day (QID) | INTRAMUSCULAR | Status: DC
Start: 2014-06-08 — End: 2014-06-09
  Administered 2014-06-08 – 2014-06-09 (×3): 15 mg via INTRAVENOUS
  Filled 2014-06-08 (×5): qty 1

## 2014-06-08 MED ORDER — PHENYLEPHRINE 40 MCG/ML (10ML) SYRINGE FOR IV PUSH (FOR BLOOD PRESSURE SUPPORT)
PREFILLED_SYRINGE | INTRAVENOUS | Status: AC
  Filled 2014-06-08: qty 10

## 2014-06-08 MED ORDER — HYDROCHLOROTHIAZIDE 12.5 MG PO CAPS
12.5000 mg | ORAL_CAPSULE | Freq: Every day | ORAL | Status: DC
  Filled 2014-06-08: qty 1

## 2014-06-08 MED ORDER — OXYCODONE HCL 5 MG/5ML PO SOLN: 5.0000 mg | Freq: Once | ORAL | Status: DC | PRN

## 2014-06-08 MED ORDER — METHOCARBAMOL 1000 MG/10ML IJ SOLN
500.0000 mg | Freq: Four times a day (QID) | INTRAMUSCULAR | Status: DC | PRN
  Filled 2014-06-08: qty 5

## 2014-06-08 MED ORDER — OXYCODONE-ACETAMINOPHEN 5-325 MG PO TABS: 1.0000 | ORAL_TABLET | ORAL | Status: DC | PRN

## 2014-06-08 MED ORDER — HYDROCODONE-ACETAMINOPHEN 5-325 MG PO TABS
1.0000 | ORAL_TABLET | Freq: Four times a day (QID) | ORAL | Status: DC | PRN
  Administered 2014-06-08 – 2014-06-09 (×3): 1 via ORAL
  Filled 2014-06-08 (×2): qty 1
  Filled 2014-06-08: qty 2

## 2014-06-08 MED ORDER — ONDANSETRON HCL 4 MG/2ML IJ SOLN
INTRAMUSCULAR | Status: AC
  Filled 2014-06-08: qty 2

## 2014-06-08 MED ORDER — CEFAZOLIN SODIUM-DEXTROSE 2-3 GM-% IV SOLR
2.0000 g | Freq: Once | INTRAVENOUS | Status: AC
  Administered 2014-06-08: 2 g via INTRAVENOUS

## 2014-06-08 SURGICAL SUPPLY — 54 items
BAG DECANTER FOR FLEXI CONT (MISCELLANEOUS) IMPLANT
BLADE SURG ROTATE 9660 (MISCELLANEOUS) IMPLANT
BUR ACORN 6.0 (BURR) IMPLANT
BUR MATCHSTICK NEURO 3.0 LAGG (BURR) ×2 IMPLANT
CANISTER SUCT 3000ML (MISCELLANEOUS) ×2 IMPLANT
CONT SPEC 4OZ CLIKSEAL STRL BL (MISCELLANEOUS) ×2 IMPLANT
DECANTER SPIKE VIAL GLASS SM (MISCELLANEOUS) ×2 IMPLANT
DERMABOND ADVANCED (GAUZE/BANDAGES/DRESSINGS) ×1
DERMABOND ADVANCED .7 DNX12 (GAUZE/BANDAGES/DRESSINGS) ×1 IMPLANT
DRAPE LAPAROTOMY T 102X78X121 (DRAPES) ×2 IMPLANT
DRAPE MICROSCOPE LEICA (MISCELLANEOUS) ×2 IMPLANT
DRAPE POUCH INSTRU U-SHP 10X18 (DRAPES) ×2 IMPLANT
DRAPE PROXIMA HALF (DRAPES) IMPLANT
DURAPREP 26ML APPLICATOR (WOUND CARE) ×2 IMPLANT
ELECT REM PT RETURN 9FT ADLT (ELECTROSURGICAL) ×2
ELECTRODE REM PT RTRN 9FT ADLT (ELECTROSURGICAL) ×1 IMPLANT
GAUZE SPONGE 4X4 12PLY STRL (GAUZE/BANDAGES/DRESSINGS) IMPLANT
GAUZE SPONGE 4X4 16PLY XRAY LF (GAUZE/BANDAGES/DRESSINGS) IMPLANT
GLOVE BIO SURGEON STRL SZ 6.5 (GLOVE) ×4 IMPLANT
GLOVE BIOGEL PI IND STRL 7.0 (GLOVE) ×1 IMPLANT
GLOVE BIOGEL PI IND STRL 8 (GLOVE) ×1 IMPLANT
GLOVE BIOGEL PI IND STRL 8.5 (GLOVE) ×1 IMPLANT
GLOVE BIOGEL PI INDICATOR 7.0 (GLOVE) ×1
GLOVE BIOGEL PI INDICATOR 8 (GLOVE) ×1
GLOVE BIOGEL PI INDICATOR 8.5 (GLOVE) ×1
GLOVE ECLIPSE 7.5 STRL STRAW (GLOVE) ×2 IMPLANT
GLOVE ECLIPSE 8.5 STRL (GLOVE) ×2 IMPLANT
GLOVE EXAM NITRILE LRG STRL (GLOVE) IMPLANT
GLOVE EXAM NITRILE MD LF STRL (GLOVE) IMPLANT
GLOVE EXAM NITRILE XL STR (GLOVE) IMPLANT
GLOVE EXAM NITRILE XS STR PU (GLOVE) IMPLANT
GOWN STRL REUS W/ TWL LRG LVL3 (GOWN DISPOSABLE) ×2 IMPLANT
GOWN STRL REUS W/ TWL XL LVL3 (GOWN DISPOSABLE) ×1 IMPLANT
GOWN STRL REUS W/TWL 2XL LVL3 (GOWN DISPOSABLE) ×2 IMPLANT
GOWN STRL REUS W/TWL LRG LVL3 (GOWN DISPOSABLE) ×2
GOWN STRL REUS W/TWL XL LVL3 (GOWN DISPOSABLE) ×1
KIT BASIN OR (CUSTOM PROCEDURE TRAY) ×2 IMPLANT
KIT ROOM TURNOVER OR (KITS) ×2 IMPLANT
NEEDLE HYPO 22GX1.5 SAFETY (NEEDLE) ×2 IMPLANT
NEEDLE SPNL 20GX3.5 QUINCKE YW (NEEDLE) IMPLANT
NS IRRIG 1000ML POUR BTL (IV SOLUTION) ×2 IMPLANT
PACK LAMINECTOMY NEURO (CUSTOM PROCEDURE TRAY) ×2 IMPLANT
PAD ARMBOARD 7.5X6 YLW CONV (MISCELLANEOUS) ×6 IMPLANT
PATTIES SURGICAL .5 X1 (DISPOSABLE) IMPLANT
RUBBERBAND STERILE (MISCELLANEOUS) ×4 IMPLANT
SPONGE SURGIFOAM ABS GEL SZ50 (HEMOSTASIS) ×2 IMPLANT
SUT VIC AB 1 CT1 18XBRD ANBCTR (SUTURE) ×1 IMPLANT
SUT VIC AB 1 CT1 8-18 (SUTURE) ×1
SUT VIC AB 2-0 CP2 18 (SUTURE) ×2 IMPLANT
SUT VIC AB 3-0 SH 8-18 (SUTURE) ×2 IMPLANT
SYR 20ML ECCENTRIC (SYRINGE) ×2 IMPLANT
TOWEL OR 17X24 6PK STRL BLUE (TOWEL DISPOSABLE) ×2 IMPLANT
TOWEL OR 17X26 10 PK STRL BLUE (TOWEL DISPOSABLE) ×2 IMPLANT
WATER STERILE IRR 1000ML POUR (IV SOLUTION) ×2 IMPLANT

## 2014-06-08 NOTE — Progress Notes (Signed)
Laughing and reminiscing of [place] , [place]/ again , denies pain, numbness, tingling or any difficulty

## 2014-06-08 NOTE — Progress Notes (Signed)
Patient ID: [name], female   DOB: [birth year], 80 y.o.   MRN: [id number] Vital signs are stable. Patient is awake and alert Facet she has no left leg pain Would like to discharge as early as possible Out suggest that she ambulates make sure she can void and plan discharge for the morning

## 2014-06-08 NOTE — Transfer of Care (Signed)
Immediate Anesthesia Transfer of Care Note  Patient: [name]  Procedure(s) Performed: Procedure(s) with comments: Left Lumbar Five-Sacral One Microdiskectomy (Left) - Left L5-S1 Microdiskectomy  Patient Location: PACU  Anesthesia Type:General  Level of Consciousness: awake, alert  and oriented  Airway & Oxygen Therapy: Patient Spontanous Breathing and Patient connected to nasal cannula oxygen  Post-op Assessment: Report given to PACU RN and Post -op Vital signs reviewed and stable  Post vital signs: Reviewed and stable  Complications: No apparent anesthesia complications

## 2014-06-08 NOTE — H&P (Signed)
[name] is an 80 y.o. female.   Chief Complaint: Severe back and left lower extremity pain HPI: Patient is a 80 year old lady who has had degenerative scoliosis that I operated on several years ago she was noted to have a fusion from L1-L5. She is done reasonably well in terms of controlling back pain recently over last few months she's developed severe left lower extremity pain. She is found to have a herniated nucleus pulposus at L5-S1 with a portion of the disc herniated behind the body of L5. This affects and compresses mostly the L5 nerve root. She is advised regarding surgery for this process.  Past Medical History  Diagnosis Date  . Atrial enlargement, left   . Mitral regurgitation   . Mitral valve prolapse   . Palpitations   . Arthritis   . Depression   . GERD (gastroesophageal reflux disease)   . Allergy   . Hyperlipidemia   . Hypertension   . 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 PONV (postoperative nausea and vomiting)   . H/O hiatal hernia   . Heart murmur     06/10/2014 seeing new cardiologist  . Heart murmur   . Diabetes mellitus without complication     fasting cbg 110s  . Anxiety   . OEHOZYYQ(825.0)     Past Surgical History  Procedure Laterality Date  . Anterior posterior and enterocele repairs  09/21/2004    With uterosacral cardinal colposuspension, partial colpocleisis; Selinda Orion, MD  . Breast enhancement surgery  02/25/2002    Bilateral reduction and excision of accessory breast tissue underneath the left breast; Aretha Parrot., MD  . Tonsillectomy and adenoidectomy    . Abdominal hysterectomy    . Cataract extraction Bilateral   . Breast surgery  11    reduction  . Rotator cuff repair Bilateral     11.12  . Back surgery    . Lumbar laminectomy/ decompression with met-rx N/A 03/16/2014    Procedure: LUMBAR FIVE-SACRAL ONE EXTRAFORAMINAL DISKECTOMY WITH METREX;  Surgeon: Kristeen Miss, MD;  Location: [facility];  Service: Neurosurgery;  Laterality: N/A;     Family History  Problem Relation Age of Onset  . Heart failure Father   . Arthritis Father   . Hyperlipidemia Father   . Heart disease Father   . Arthritis Mother   . Hyperlipidemia Mother   . Hypertension Mother    Social History:  reports that she has never smoked. She has never used smokeless tobacco. She reports that she drinks alcohol. She reports that she does not use illicit drugs.  Allergies:  Allergies  Allergen Reactions  . Codeine     Facial swelling  . Cortisone     Insomnia, heart palpitations (po only)  . Sulfonamide Derivatives     As a child    No prescriptions prior to admission    No results found for this or any previous visit (from the past 1 hour(s)). No results found.  Review of Systems  HENT: Negative.   Eyes: Negative.   Respiratory: Negative.   Cardiovascular: Negative.   Gastrointestinal: Negative.   Genitourinary: Negative.   Musculoskeletal: Positive for back pain.  Skin: Negative.   Neurological: Positive for focal weakness and weakness.  Endo/Heme/Allergies: Negative.   Psychiatric/Behavioral: Negative.     There were no vitals taken for this visit. Physical Exam  Constitutional: She appears well-developed and well-nourished.  HENT:  Head: Normocephalic and atraumatic.  Eyes: Conjunctivae and EOM are normal. Pupils are  equal, round, and reactive to light.  Neck: Normal range of motion.  Cardiovascular: Normal rate and regular rhythm.   Respiratory: Effort normal and breath sounds normal.  GI: Bowel sounds are normal.  Musculoskeletal:  Tender across lower lumbar spine and both superior iliac crest.     Assessment/Plan Herniated nucleus pulposus L5-S1 left with left lumbar radiculopathy  Microdiscectomy L5-S1 left with operating microscope microdissection technique  [name] 06/08/2014, 2:18 PM

## 2014-06-08 NOTE — Anesthesia Procedure Notes (Signed)
Procedure Name: Intubation Date/Time: 06/08/2014 3:28 PM Performed by: Susa Loffler Pre-anesthesia Checklist: Patient identified, Timeout performed, Emergency Drugs available, Suction available and Patient being monitored Patient Re-evaluated:Patient Re-evaluated prior to inductionOxygen Delivery Method: Circle system utilized Preoxygenation: Pre-oxygenation with 100% oxygen Intubation Type: IV induction Ventilation: Mask ventilation without difficulty Laryngoscope Size: Mac and 3 Grade View: Grade II Tube type: Oral Tube size: 7.0 mm Number of attempts: 1 Airway Equipment and Method: Stylet Placement Confirmation: ETT inserted through vocal cords under direct vision,  positive ETCO2 and breath sounds checked- equal and bilateral Secured at: 21 cm Tube secured with: Tape Dental Injury: Teeth and Oropharynx as per pre-operative assessment

## 2014-06-08 NOTE — Op Note (Signed)
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 Anesthesia: Gen. endotracheal Indications: [name] is a 80 year old individual who has had a disc herniation L5-S1 on the left side. She's had previous surgery from L1-L5 to include decompression of a lumbar scoliosis with fixation and fusion. She's had a right-sided L5-S1 disc herniation several months ago. She tolerated that surgery well. She's been advised of surgery of L5-S1 left-sided she wires.  Procedure: The patient was brought to the operating room supine on a stretcher after the smooth induction of general endotracheal anesthesia she was turned prone. The back was prepped with alcohol and DuraPrep and draped in a sterile fashion. L5-S1 was localized positively with a radiograph and the skin marked. A small vertical incision was created over the L5-S1 level on the left side this was carried down to the lumbar dorsal fascia. Lumbar dorsal fascia was opened in a subperiosteal fashion on the left side and the inferior margin lamina of L5 was identified and a laminotomy was created at L5 and the dissection was carried down to the yellow ligament. The yellow ligament was taken up and the common dural tube was exposed. The lateral aspect of this dissection was then carefully explored and just superior to the takeoff of the S1 nerve root a significant mass is identified. This was consistent with a mass of disc that was noted on a CAT scan. This was then gently dissected in the area under the common dural tube was mobilized and ultimately decompressed with  substantial quantities of severely degenerated and desiccated disc material. The S1 nerve root was identified and this was similarly dissected free and decompressed. The mass of the disc was then decompressed from behind the body of L5 out to the exit foramen for the L5 nerve root superiorly. This relief pressure and the L5 nerve root. The disc space was noted to be tightly collapsed at L5-S1. A probe could not be inserted into it. With this hemostasis was achieved. The L5 and S1 nerve roots were probed and found to be free and clear. Hemostasis was established and then microscope was removed the retractor was removed lumbar dorsal fascia was closed with 2-0 Vicryl in an interrupted fashion and 3-0 Vicryl is used to close the subcuticular skin. Blood loss for the procedure was less than 50 cc.

## 2014-06-08 NOTE — Discharge Summary (Signed)
Physician Discharge Summary  Patient ID: [name] MRN: [id number] DOB/AGE: 1934/07/10 80 y.o.  Admit date: 06/08/2014 Discharge date: 06/08/2014  Admission Diagnoses: Herniated nucleus pulposus L5-S1 on the left left lumbar radiculopathy  Discharge Diagnoses: Herniated nucleus pulposus L5-S1 on the left lumbar radiculopathy Active Problems:   Herniated nucleus pulposus, L5-S1, left   Discharged Condition: good  Hospital Course: Microdiscectomy L5-S1 left with operating microscope microdissection technique. Asian had good results.  Consults: None  Significant Diagnostic Studies: None  Treatments: surgery: Microdiscectomy L5-S1 left left lumbar decompression with operating microscope microdissection technique.  Discharge Exam: Blood pressure 147/53, pulse 73, temperature 97.8 F (36.6 C), temperature source Oral, resp. rate 18, height 5\' 6"  (1.676 m), weight 60.81 kg (134 lb 1 oz), SpO2 97.00%. Incision is clean and dry motor function is intact in lower extremities.  Disposition: 01-Home or Self Care  Discharge Instructions   Call MD for:  redness, tenderness, or signs of infection (pain, swelling, redness, odor or green/yellow discharge around incision site)    Complete by:  As directed      Call MD for:  severe uncontrolled pain    Complete by:  As directed      Call MD for:  temperature >100.4    Complete by:  As directed      Diet - low sodium heart healthy    Complete by:  As directed      Discharge instructions    Complete by:  As directed   Okay to shower. Do not apply salves or appointments to incision. No heavy lifting with the upper extremities greater than 15 pounds. May resume driving when not requiring pain medication and patient feels comfortable with doing so.     Increase activity slowly    Complete by:  As directed             Medication List         clonazePAM 1 MG tablet  Commonly known as:  KLONOPIN  Take 1 mg by mouth at bedtime as needed  for anxiety.     diltiazem 240 MG 24 hr capsule  Commonly known as:  DILACOR XR  Take 1 capsule (240 mg total) by mouth daily.     estradiol 0.1 MG/GM vaginal cream  Commonly known as:  ESTRACE  Place 1 Applicatorful vaginally 2 (two) times a week. On tuesdays & thursdays     fish oil-omega-3 fatty acids 1000 MG capsule  Take 1 g by mouth daily.     fluticasone 50 MCG/ACT nasal spray  Commonly known as:  FLONASE  Place 2 sprays into both nostrils daily as needed for allergies.     glucose blood test strip  Commonly known as:  ONETOUCH VERIO  Check 2 times daily. Use as instructed. DX: 250.00     HYDROcodone-acetaminophen 5-325 MG per tablet  Commonly known as:  NORCO/VICODIN  Take 1-2 tablets by mouth every 6 (six) hours as needed for moderate pain.     ibuprofen 200 MG tablet  Commonly known as:  ADVIL,MOTRIN  Take 400 mg by mouth every 6 (six) hours as needed for mild pain.     lisinopril-hydrochlorothiazide 10-12.5 MG per tablet  Commonly known as:  PRINZIDE,ZESTORETIC  Take 1 tablet by mouth daily.     metFORMIN 500 MG tablet  Commonly known as:  GLUCOPHAGE  Take 500-1,000 mg by mouth 2 (two) times daily with a meal. Takes 1000 mg in the morning and 500 mg at supper  ONETOUCH DELICA LANCETS FINE Misc  Check 2 times daily. Use as instructed. DX: 250.00     pantoprazole 40 MG tablet  Commonly known as:  PROTONIX  Take 1 tablet (40 mg total) by mouth daily.     triamcinolone cream 0.1 %  Commonly known as:  KENALOG  Apply 1 application topically 2 (two) times daily.     Vitamin D 1000 UNITS capsule  Take 1,000 Units by mouth daily.           Follow-up Information   Follow up with Earleen Newport, MD.   Specialty:  Neurosurgery   Contact information:   [address] [place] [address] [phone]       Signed: Earleen Newport 06/08/2014, 6:23 PM

## 2014-06-08 NOTE — Anesthesia Preprocedure Evaluation (Addendum)
Anesthesia Evaluation  Patient identified by MRN, date of birth, ID band Patient awake    Reviewed: Allergy & Precautions, H&P , NPO status , Patient's Chart, lab work & pertinent test results  History of Anesthesia Complications (+) PONV and history of anesthetic complications  Airway Mallampati: II TM Distance: >3 FB Neck ROM: Full    Dental  (+) Caps, Dental Advisory Given   Pulmonary neg pulmonary ROS,  breath sounds clear to auscultation        Cardiovascular hypertension, Pt. on medications - angina+ Valvular Problems/Murmurs MVP and MR Rhythm:Regular Rate:Normal + Systolic murmurs '12 ECHO: EF 55-60%, Mild MR with bileaflet prolapse    Neuro/Psych Anxiety Familial tremor    GI/Hepatic Neg liver ROS, GERD-  Medicated and Controlled,  Endo/Other  diabetes (glu 97), Oral Hypoglycemic Agents  Renal/GU negative Renal ROS     Musculoskeletal   Abdominal (+) + obese,   Peds  Hematology   Anesthesia Other Findings   Reproductive/Obstetrics                         Anesthesia Physical Anesthesia Plan  ASA: III  Anesthesia Plan: General   Post-op Pain Management:    Induction: Intravenous  Airway Management Planned: Oral ETT  Additional Equipment:   Intra-op Plan:   Post-operative Plan: Extubation in OR  Informed Consent: I have reviewed the patients History and Physical, chart, labs and discussed the procedure including the risks, benefits and alternatives for the proposed anesthesia with the patient or authorized representative who has indicated his/her understanding and acceptance.   Dental advisory given  Plan Discussed with: CRNA and Surgeon  Anesthesia Plan Comments: (Plan routine monitors, GETA)        Anesthesia Quick Evaluation

## 2014-06-08 NOTE — Plan of Care (Signed)
Problem: Consults Goal: Diagnosis - Spinal Surgery Outcome: Completed/Met Date Met:  06/08/14 Microdiscectomy

## 2014-06-09 ENCOUNTER — Encounter (HOSPITAL_COMMUNITY): Payer: Self-pay | Admitting: Neurological Surgery

## 2014-06-09 DIAGNOSIS — M5126 Other intervertebral disc displacement, lumbar region: Secondary | ICD-10-CM | POA: Diagnosis not present

## 2014-06-09 LAB — GLUCOSE, CAPILLARY: GLUCOSE-CAPILLARY: 120 mg/dL — AB (ref 70–99)

## 2014-06-09 NOTE — Progress Notes (Signed)
PT Cancellation Note  Patient Details Name: ELIANIE HUBERS MRN: [id number] DOB: Feb 21, 1934   Cancelled Treatment:    Reason Eval/Treat Not Completed: PT screened, no needs identified, will sign off  Pt ambulated >50 feet without A.D. And performed stair training mod I.    Lorriane Shire 06/09/2014, 8:53 AM

## 2014-06-09 NOTE — Progress Notes (Signed)
Utilization review completed.  

## 2014-06-09 NOTE — Progress Notes (Signed)
Patient alert and oriented, mae's well, voiding adequate amount of urine, swallowing without difficulty, no c/o pain. Patient discharged home with family. Script and discharged instructions given to patient. Patient and family stated understanding of d/c instructions given and has an appointment with MD. 

## 2014-06-09 NOTE — Evaluation (Signed)
Occupational Therapy Evaluation Patient Details Name: [name] MRN: [id number] DOB: [birth year] Today's Date: 06/09/2014    History of Present Illness 80 yo female s/p L5-s1 laminectomy . Pt with previous surg on Right side 2 months ago.  pmh: depression, HTN, positive TB test, HM, anxiety, BIL rotator cuff repair, back surg, cataract extraction   Clinical Impression   Patient evaluated by Occupational Therapy with no further acute OT needs identified. All education has been completed and the patient has no further questions. See below for any follow-up Occupational Therapy or equipment needs. OT to sign off. Thank you for referral.   Pt provided handout and ambulated >50 ft this session. Pt educated on pain management at night and first 48 hours greatest risk for falls. Pt reports using night light in the bathroom at baseline.     Follow Up Recommendations  No OT follow up    Equipment Recommendations  None recommended by OT    Recommendations for Other Services       Precautions / Restrictions Precautions Precautions: Back Precaution Comments: back handout provided and reviewed in detail      Mobility Bed Mobility Overal bed mobility: Modified Independent             General bed mobility comments: cues for back precautions  Transfers Overall transfer level: Modified independent                    Balance Overall balance assessment: No apparent balance deficits (not formally assessed)                                          ADL Overall ADL's : Modified independent                                       General ADL Comments: pt able to don bil socks, educated on back precautions with adls. Pt completed bed mobility and reports performing oral care prior to arrival. Pt observed eating at independent level     Vision                     Perception     Praxis      Pertinent Vitals/Pain Pain Assessment:  No/denies pain     Hand Dominance Left   Extremity/Trunk Assessment Upper Extremity Assessment Upper Extremity Assessment: Overall WFL for tasks assessed   Lower Extremity Assessment Lower Extremity Assessment: Defer to PT evaluation   Cervical / Trunk Assessment Cervical / Trunk Assessment: Other exceptions (multiple back surg)   Communication Communication Communication: No difficulties   Cognition Arousal/Alertness: Awake/alert Behavior During Therapy: WFL for tasks assessed/performed Overall Cognitive Status: Within Functional Limits for tasks assessed       Memory: Decreased short-term memory (needed cues to recall back precautions)             General Comments       Exercises       Shoulder Instructions      Home Living Family/patient expects to be discharged to:: Private residence Living Arrangements: Spouse/significant other Available Help at Discharge: Family;Available 24 hours/day Type of Home: House Home Access: Stairs to enter [employer] of Steps: 5 Entrance Stairs-Rails: Left;Right Home Layout: Two level;Able to live on main level with bedroom/bathroom  Bathroom Shower/Tub: Corporate investment banker: Standard     Home Equipment: Cane - single point          Prior Functioning/Environment Level of Independence: Independent;Independent with assistive device(s)        Comments: using cane prior to surg due to pain    OT Diagnosis:     OT Problem List:     OT Treatment/Interventions:      OT Goals(Current goals can be found in the care plan section) Acute Rehab OT Goals Patient Stated Goal: to start walking when the rain stops OT Goal Formulation: With patient  OT Frequency:     Barriers to D/C:            Co-evaluation              End of Session Nurse Communication: Mobility status;Precautions  Activity Tolerance: Patient tolerated treatment well Patient left: in bed;with call  bell/phone within reach   Time: 0822-0840 OT Time Calculation (min): 18 min Charges:  OT General Charges $OT Visit: 1 Procedure OT Evaluation $Initial OT Evaluation Tier I: 1 Procedure OT Treatments $Self Care/Home Management : 8-22 mins G-Codes: OT G-codes **NOT FOR INPATIENT CLASS** Functional Assessment Tool Used: clinical judgement Functional Limitation: Self care Self Care Current Status (X6468): 0 percent impaired, limited or restricted Self Care Goal Status (E3212): 0 percent impaired, limited or restricted Self Care Discharge Status (Y4825): 0 percent impaired, limited or restricted  Parke Poisson B 06/09/2014, 8:45 AM Pager: [phone]

## 2014-06-09 NOTE — Progress Notes (Signed)
HPI Patient is a 80 yo who was previously followed by T [facility]  Hx of MVP and MR and palpitations  Last echo 2012 Since seen the patinet had 2 back surgeries Patient denies signif palpitations.  No CP   bReathing OK    Didn;t tolerate statins in past  Achy.   Allergies  Allergen Reactions  . Codeine     Facial swelling  . Cortisone     Insomnia, heart palpitations (po only)  . Sulfonamide Derivatives     As a child    Current Outpatient Prescriptions  Medication Sig Dispense Refill  . Cholecalciferol (VITAMIN D) 1000 UNITS capsule Take 1,000 Units by mouth daily.        . clonazePAM (KLONOPIN) 1 MG tablet Take 1 mg by mouth at bedtime as needed for anxiety.       [name] diltiazem (DILACOR XR) 240 MG 24 hr capsule Take 1 capsule (240 mg total) by mouth daily.  30 capsule  0  . estradiol (ESTRACE) 0.1 MG/GM vaginal cream Place 1 Applicatorful vaginally 2 (two) times a week. On [place]      . fish oil-omega-3 fatty acids 1000 MG capsule Take 1 g by mouth daily.        . fluticasone (FLONASE) 50 MCG/ACT nasal spray Place 2 sprays into both nostrils daily as needed for allergies.      [name] glucose blood (ONETOUCH VERIO) test strip Check 2 times daily. Use as instructed. DX: 250.00  100 each  3  . HYDROcodone-acetaminophen (NORCO/VICODIN) 5-325 MG per tablet Take 1-2 tablets by mouth every 6 (six) hours as needed for moderate pain.  60 tablet  0  . ibuprofen (ADVIL,MOTRIN) 200 MG tablet Take 400 mg by mouth every 6 (six) hours as needed for mild pain.       [name] lisinopril-hydrochlorothiazide (PRINZIDE,ZESTORETIC) 10-12.5 MG per tablet Take 1 tablet by mouth daily.  90 tablet  3  . metFORMIN (GLUCOPHAGE) 500 MG tablet Take 500-1,000 mg by mouth 2 (two) times daily with a meal. Takes 1000 mg in the morning and 500 mg at supper      . ONETOUCH DELICA LANCETS FINE MISC Check 2 times daily. Use as instructed. DX: 250.00  100 each  3  . pantoprazole (PROTONIX) 40 MG tablet Take 1 tablet (40 mg  total) by mouth daily.  90 tablet  3  . triamcinolone cream (KENALOG) 0.1 % Apply 1 application topically 2 (two) times daily.  30 g  0  . [DISCONTINUED] fenofibrate (TRICOR) 145 MG tablet Take 1 tablet (145 mg total) by mouth daily.  90 tablet  3   No current facility-administered medications for this visit.    Past Medical History  Diagnosis Date  . Atrial enlargement, left   . Mitral regurgitation   . Mitral valve prolapse   . Palpitations   . Arthritis   . Depression   . GERD (gastroesophageal reflux disease)   . Allergy   . Hyperlipidemia   . Hypertension   . 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 PONV (postoperative nausea and vomiting)   . H/O hiatal hernia   . Heart murmur     06/10/2014 seeing new cardiologist  . Heart murmur   . Diabetes mellitus without complication     fasting cbg 110s  . Anxiety   . XYVOPFYT(244.6)     Past Surgical History  Procedure Laterality Date  . Anterior posterior and enterocele repairs  09/21/2004    With uterosacral  cardinal colposuspension, partial colpocleisis; Selinda Orion, MD  . Breast enhancement surgery  02/25/2002    Bilateral reduction and excision of accessory breast tissue underneath the left breast; Aretha Parrot., MD  . Tonsillectomy and adenoidectomy    . Abdominal hysterectomy    . Cataract extraction Bilateral   . Breast surgery  11    reduction  . Rotator cuff repair Bilateral     11.12  . Back surgery    . Lumbar laminectomy/ decompression with met-rx N/A 03/16/2014    Procedure: LUMBAR FIVE-SACRAL ONE EXTRAFORAMINAL DISKECTOMY WITH METREX;  Surgeon: Kristeen Miss, MD;  Location: [facility];  Service: Neurosurgery;  Laterality: N/A;  . Lumbar laminectomy/decompression microdiscectomy Left 06/08/2014    Procedure: Left Lumbar Five-Sacral One Microdiskectomy;  Surgeon: Kristeen Miss, MD;  Location: [facility];  Service: Neurosurgery;  Laterality: Left;  Left L5-S1 Microdiskectomy    Family History  Problem Relation Age  of Onset  . Heart failure Father   . Arthritis Father   . Hyperlipidemia Father   . Heart disease Father   . Arthritis Mother   . Hyperlipidemia Mother   . Hypertension Mother     History   Social History  . Marital Status: Married    Spouse Name: N/A    Number of Children: N/A  . Years of Education: N/A   Occupational History  . Retired    Social History Main Topics  . Smoking status: Never Smoker   . Smokeless tobacco: Never Used  . Alcohol Use: Yes     Comment: one maybe every 3 mths  . Drug Use: No  . Sexual Activity: Not on file   Other Topics Concern  . Not on file   Social History Narrative   Married   Regular exercise    Review of Systems:  All systems reviewed.  They are negative to the above problem except as previously stated.  Vital Signs: BP 117/55  Pulse 68  Ht _0  (1.676 m)  Wt 136 lb 6.4 oz (61.871 kg)  BMI 22.03 kg/m2  Physical Exam [place] isin NAD HEENT:  Normocephalic, atraumatic. EOMI, PERRLA.  Neck: JVP is normal.  No bruits.  Lungs: clear to auscultation. No rales no wheezes.  Heart: Regular rate and rhythm. Normal S1, S2. No S3.   No significant murmurs. PMI not displaced.  Abdomen:  Supple, nontender. Normal bowel sounds. No masses. No hepatomegaly.  Extremities:   Good distal pulses throughout. No lower extremity edema.  Musculoskeletal :moving all extremities.  Neuro:   alert and oriented x3.  CN II-XII grossly intact.   Assessment and Plan: 1  MV disease.  No signif murmurs.  Follow    2.  Palpitations  Denies  Follow  3.  HL  LDL was 110s  With dm would recomm lowering  Patient did not tolerate statins in past. Would try Zetia once she recovers from recent surgery.

## 2014-06-10 ENCOUNTER — Ambulatory Visit (INDEPENDENT_AMBULATORY_CARE_PROVIDER_SITE_OTHER): Payer: Medicare Other | Admitting: Internal Medicine

## 2014-06-10 ENCOUNTER — Encounter: Payer: Self-pay | Admitting: Internal Medicine

## 2014-06-10 ENCOUNTER — Other Ambulatory Visit: Payer: Self-pay | Admitting: Family Medicine

## 2014-06-10 VITALS — BP 117/55 | HR 68 | Ht 66.0 in | Wt 136.4 lb

## 2014-06-10 DIAGNOSIS — I059 Rheumatic mitral valve disease, unspecified: Secondary | ICD-10-CM

## 2014-06-10 DIAGNOSIS — E785 Hyperlipidemia, unspecified: Secondary | ICD-10-CM

## 2014-06-10 DIAGNOSIS — I10 Essential (primary) hypertension: Secondary | ICD-10-CM

## 2014-06-10 MED ORDER — LISINOPRIL-HYDROCHLOROTHIAZIDE 10-12.5 MG PO TABS
1.0000 | ORAL_TABLET | Freq: Every day | ORAL | Status: DC
Start: 2014-06-10 — End: 2015-01-28

## 2014-06-10 MED ORDER — DILTIAZEM HCL ER 240 MG PO CP24: 240.0000 mg | ORAL_CAPSULE | Freq: Every day | ORAL | Status: DC

## 2014-06-10 NOTE — Patient Instructions (Signed)
Your physician recommends that you continue on your current medications as directed. Please refer to the Current Medication list given to you today. Your physician wants you to follow-up in: 9 MONTHS WITH DR ROSS.  You will receive a reminder letter in the mail two months in advance. If you don't receive a letter, please call our office to schedule the follow-up appointment.  

## 2014-06-10 NOTE — Anesthesia Postprocedure Evaluation (Signed)
  Anesthesia Post-op Note  Patient: [name]  Procedure(s) Performed: Procedure(s) with comments: Left Lumbar Five-Sacral One Microdiskectomy (Left) - Left L5-S1 Microdiskectomy  Patient discharged with no reported anesthetic complications

## 2014-06-10 NOTE — Telephone Encounter (Signed)
Last visit 05/21/14 Last refill 08/11/13 #30 3 refill

## 2014-06-11 ENCOUNTER — Telehealth: Payer: Self-pay | Admitting: Family Medicine

## 2014-06-11 NOTE — Telephone Encounter (Signed)
Refill for 3 months.  Try to avoid regular use.

## 2014-06-11 NOTE — Telephone Encounter (Signed)
emmi mailed  °

## 2014-06-22 ENCOUNTER — Other Ambulatory Visit: Payer: Self-pay | Admitting: Family Medicine

## 2014-06-24 ENCOUNTER — Telehealth: Payer: Self-pay | Admitting: Family Medicine

## 2014-06-24 MED ORDER — METFORMIN HCL 500 MG PO TABS: ORAL_TABLET | ORAL | Status: DC

## 2014-06-24 NOTE — Telephone Encounter (Signed)
Rx sent to pharmacy   

## 2014-06-24 NOTE — Telephone Encounter (Signed)
Pt needs refill on metformin #270 for 90 day supply  With refills [facility]

## 2014-06-27 ENCOUNTER — Other Ambulatory Visit: Payer: Self-pay | Admitting: Family Medicine

## 2014-07-16 ENCOUNTER — Encounter: Payer: Self-pay | Admitting: Family Medicine

## 2014-07-16 LAB — HM DIABETES EYE EXAM

## 2014-07-19 ENCOUNTER — Telehealth: Payer: Self-pay | Admitting: Family Medicine

## 2014-07-19 ENCOUNTER — Other Ambulatory Visit: Payer: Self-pay | Admitting: Family Medicine

## 2014-07-19 MED ORDER — METFORMIN HCL 500 MG PO TABS: ORAL_TABLET | ORAL | Status: DC

## 2014-07-19 NOTE — Telephone Encounter (Signed)
Rx sent to pharmacy   

## 2014-07-19 NOTE — Telephone Encounter (Signed)
Pt request refill of the following: metFORMIN (GLUCOPHAGE) 500 MG tablet   Phamacy:  [facility]

## 2014-08-18 ENCOUNTER — Other Ambulatory Visit: Payer: Self-pay | Admitting: Family Medicine

## 2014-09-08 ENCOUNTER — Ambulatory Visit: Payer: Medicare Other | Admitting: Family Medicine

## 2014-09-08 ENCOUNTER — Other Ambulatory Visit: Payer: Self-pay | Admitting: Family Medicine

## 2014-09-08 NOTE — Telephone Encounter (Signed)
Refill for 6 months. 

## 2014-09-20 ENCOUNTER — Ambulatory Visit (INDEPENDENT_AMBULATORY_CARE_PROVIDER_SITE_OTHER): Payer: Medicare Other | Admitting: Family Medicine

## 2014-09-20 ENCOUNTER — Encounter: Payer: Self-pay | Admitting: Family Medicine

## 2014-09-20 VITALS — BP 129/70 | HR 79 | Temp 97.4°F | Wt 128.0 lb

## 2014-09-20 DIAGNOSIS — F331 Major depressive disorder, recurrent, moderate: Secondary | ICD-10-CM | POA: Diagnosis not present

## 2014-09-20 DIAGNOSIS — L659 Nonscarring hair loss, unspecified: Secondary | ICD-10-CM

## 2014-09-20 LAB — TSH: TSH: 0.75 u[IU]/mL (ref 0.35–4.50)

## 2014-09-20 MED ORDER — CITALOPRAM HYDROBROMIDE 20 MG PO TABS: 20.0000 mg | ORAL_TABLET | Freq: Every day | ORAL | Status: DC

## 2014-09-20 NOTE — Patient Instructions (Signed)

## 2014-09-20 NOTE — Progress Notes (Signed)
Subjective:    Patient ID: [name], female    DOB: 1934/05/22, 81 y.o.   MRN: [id number]  HPI  Patient seen for the following issues  History of recurrent depression. She lost her son this year. She had been on Celexa and doing well but took her self off. She recently had a granddaughter whose husband died of cancer just couple weeks ago. This is triggered some recurrent depression issues. Frequent crying spells. Low motivation. Difficulty concentrating. Early-morning awakening. Decreased appetite. Patient requesting going back on Celexa which worked well for her previously. No suicidal ideation  Hair loss. Diffuse hair loss worse over the past couple of months. Requesting thyroid check. Increase fatigue issues.  Past Medical History  Diagnosis Date  . Atrial enlargement, left   . Mitral regurgitation   . Mitral valve prolapse   . Palpitations   . Arthritis   . Depression   . GERD (gastroesophageal reflux disease)   . Allergy   . Hyperlipidemia   . Hypertension   . 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 PONV (postoperative nausea and vomiting)   . H/O hiatal hernia   . Heart murmur     06/10/2014 seeing new cardiologist  . Heart murmur   . Diabetes mellitus without complication     fasting cbg 110s  . Anxiety   . FHLKTGYB(638.9)    Past Surgical History  Procedure Laterality Date  . Anterior posterior and enterocele repairs  09/21/2004    With uterosacral cardinal colposuspension, partial colpocleisis; Selinda Orion, MD  . Breast enhancement surgery  02/25/2002    Bilateral reduction and excision of accessory breast tissue underneath the left breast; Aretha Parrot., MD  . Tonsillectomy and adenoidectomy    . Abdominal hysterectomy    . Cataract extraction Bilateral   . Breast surgery  11    reduction  . Rotator cuff repair Bilateral     11.12  . Back surgery    . Lumbar laminectomy/ decompression with met-rx N/A 03/16/2014    Procedure: LUMBAR FIVE-SACRAL ONE  EXTRAFORAMINAL DISKECTOMY WITH METREX;  Surgeon: Kristeen Miss, MD;  Location: [facility];  Service: Neurosurgery;  Laterality: N/A;  . Lumbar laminectomy/decompression microdiscectomy Left 06/08/2014    Procedure: Left Lumbar Five-Sacral One Microdiskectomy;  Surgeon: Kristeen Miss, MD;  Location: [facility];  Service: Neurosurgery;  Laterality: Left;  Left L5-S1 Microdiskectomy    reports that she has never smoked. She has never used smokeless tobacco. She reports that she drinks alcohol. She reports that she does not use illicit drugs. family history includes Arthritis in her father and mother; Heart disease in her father; Heart failure in her father; Hyperlipidemia in her father and mother; Hypertension in her mother. Allergies  Allergen Reactions  . Codeine     Facial swelling  . Cortisone     Insomnia, heart palpitations (po only)  . Sulfonamide Derivatives     As a child     Review of Systems  Constitutional: Positive for appetite change and fatigue. Negative for fever.  Respiratory: Negative for cough and shortness of breath.   Cardiovascular: Negative for chest pain.  Gastrointestinal: Negative for abdominal pain.  Endocrine: Negative for heat intolerance, polydipsia and polyuria.  Genitourinary: Negative for dysuria.  Psychiatric/Behavioral: Positive for sleep disturbance, dysphoric mood and decreased concentration. Negative for suicidal ideas and agitation.       Objective:   Physical Exam  Constitutional: She is oriented to person, place, and time. She appears well-developed  and well-nourished.  HENT:  Head: Normocephalic and atraumatic.  Neck: Neck supple. No thyromegaly present.  Cardiovascular: Normal rate.   Pulmonary/Chest: Effort normal and breath sounds normal. No respiratory distress. She has no wheezes. She has no rales.  Musculoskeletal: She exhibits no edema.  Neurological: She is alert and oriented to person, place, and time.  Skin:  She has some  general diffuse thinning of hair  Psychiatric:  Depressed mood. Appropriate interaction otherwise          Assessment & Plan:  #1 recurrent depression moderate severity. PH Q9 score of 17.*Back Celexa 20 mg once daily. Suggest she consider further counseling through hospice which she has had in the past. Reassess 3 weeks #2 general hair loss and fatigue. Recheck TSH

## 2014-09-20 NOTE — Progress Notes (Signed)
Pre visit review using our clinic review tool, if applicable. No additional management support is needed unless otherwise documented below in the visit note. 

## 2014-09-21 ENCOUNTER — Telehealth: Payer: Self-pay | Admitting: Family Medicine

## 2014-09-21 NOTE — Telephone Encounter (Signed)
Pt returning your call. Also wanting refill of fluticasone (FLONASE) 50 MCG/ACT nasal spray  [facility]/ [address]  Aware you are out this afternoon

## 2014-09-22 MED ORDER — FLUTICASONE PROPIONATE 50 MCG/ACT NA SUSP: 2.0000 | Freq: Every day | NASAL | Status: DC | PRN

## 2014-09-22 NOTE — Telephone Encounter (Signed)
Rx sent to pharmacy   

## 2014-10-11 ENCOUNTER — Ambulatory Visit (INDEPENDENT_AMBULATORY_CARE_PROVIDER_SITE_OTHER): Payer: Medicare Other | Admitting: Family Medicine

## 2014-10-11 ENCOUNTER — Encounter: Payer: Self-pay | Admitting: Family Medicine

## 2014-10-11 VITALS — BP 120/68 | HR 69 | Temp 97.6°F | Wt 130.0 lb

## 2014-10-11 DIAGNOSIS — J019 Acute sinusitis, unspecified: Secondary | ICD-10-CM | POA: Diagnosis not present

## 2014-10-11 DIAGNOSIS — F32A Depression, unspecified: Secondary | ICD-10-CM

## 2014-10-11 DIAGNOSIS — N952 Postmenopausal atrophic vaginitis: Secondary | ICD-10-CM | POA: Diagnosis not present

## 2014-10-11 DIAGNOSIS — F329 Major depressive disorder, single episode, unspecified: Secondary | ICD-10-CM | POA: Diagnosis not present

## 2014-10-11 DIAGNOSIS — H6123 Impacted cerumen, bilateral: Secondary | ICD-10-CM | POA: Diagnosis not present

## 2014-10-11 MED ORDER — ESTRADIOL 0.1 MG/GM VA CREA: 1.0000 | TOPICAL_CREAM | VAGINAL | Status: DC

## 2014-10-11 MED ORDER — AMOXICILLIN-POT CLAVULANATE 875-125 MG PO TABS: 1.0000 | ORAL_TABLET | Freq: Two times a day (BID) | ORAL | Status: DC

## 2014-10-11 NOTE — Progress Notes (Signed)
Subjective:    Patient ID: [name], female    DOB: [birth year], 81 y.o.   MRN: [id number]  HPI Here for several issues as follows  Depression. Refer to prior note. We started her back on Celexa 20 mg once daily. She had pH Q9 score of 17. No suicidal ideation. She has noted good improvement since starting this. She has occasional crying spells is starting to get out and do more things. She's had previous counseling through hospice and we have encouraged her to continue reinitiating that. Recent TSH normal.  New acute issue of bilateral maxillary and some frontal sinus pressure over several weeks if not months. She has frequent congestion especially at night. Thick yellow nasal discharge. Occasional headaches. No fevers. She's tried nasal saline irrigation and decongestants without much improvement.  History of cerumen impactions in the past. Fullness in both ears right greater than left.  History of atrophic vaginitis. Requesting refills of Estrace vaginal cream. She has significant pain and itching when she is not using this.  Past Medical History  Diagnosis Date  . Atrial enlargement, left   . Mitral regurgitation   . Mitral valve prolapse   . Palpitations   . Arthritis   . Depression   . GERD (gastroesophageal reflux disease)   . Allergy   . Hyperlipidemia   . Hypertension   . 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 PONV (postoperative nausea and vomiting)   . H/O hiatal hernia   . Heart murmur     06/10/2014 seeing new cardiologist  . Heart murmur   . Diabetes mellitus without complication     fasting cbg 110s  . Anxiety   . FBPZWCHE(527.7)    Past Surgical History  Procedure Laterality Date  . Anterior posterior and enterocele repairs  09/21/2004    With uterosacral cardinal colposuspension, partial colpocleisis; Selinda Orion, MD  . Breast enhancement surgery  02/25/2002    Bilateral reduction and excision of accessory breast tissue underneath the left breast; Aretha Parrot., MD  . Tonsillectomy and adenoidectomy    . Abdominal hysterectomy    . Cataract extraction Bilateral   . Breast surgery  11    reduction  . Rotator cuff repair Bilateral     11.12  . Back surgery    . Lumbar laminectomy/ decompression with met-rx N/A 03/16/2014    Procedure: LUMBAR FIVE-SACRAL ONE EXTRAFORAMINAL DISKECTOMY WITH METREX;  Surgeon: Kristeen Miss, MD;  Location: [place] [facility];  Service: Neurosurgery;  Laterality: N/A;  . Lumbar laminectomy/decompression microdiscectomy Left 06/08/2014    Procedure: Left Lumbar Five-Sacral One Microdiskectomy;  Surgeon: Kristeen Miss, MD;  Location: [place] [facility];  Service: Neurosurgery;  Laterality: Left;  Left L5-S1 Microdiskectomy    reports that she has never smoked. She has never used smokeless tobacco. She reports that she drinks alcohol. She reports that she does not use illicit drugs. family history includes Arthritis in her father and mother; Heart disease in her father; Heart failure in her father; Hyperlipidemia in her father and mother; Hypertension in her mother. Allergies  Allergen Reactions  . Codeine     Facial swelling  . Cortisone     Insomnia, heart palpitations (po only)  . Sulfonamide Derivatives     As a child      Review of Systems  Constitutional: Positive for fatigue.  HENT: Positive for congestion and sinus pressure.   Eyes: Negative for visual disturbance.  Respiratory: Negative for cough, chest tightness, shortness of breath  and wheezing.   Cardiovascular: Negative for chest pain, palpitations and leg swelling.  Genitourinary: Negative for dysuria.  Neurological: Negative for dizziness, seizures, syncope, weakness, light-headedness and headaches.  Psychiatric/Behavioral: Positive for dysphoric mood. Negative for suicidal ideas, confusion, sleep disturbance and agitation.       Objective:   Physical Exam  Constitutional: She is oriented to person, place, and time. She appears well-developed and  well-nourished.  HENT:  Head: Normocephalic and atraumatic.  Cerumen impactions bilaterally. Removed with curette and eardrums appear normal. Nasal mucosa is erythematous with thick yellow mucus bilaterally. No visible polyps. Oropharynx clear.  Neck: Neck supple.  Cardiovascular: Normal rate and regular rhythm.   Pulmonary/Chest: Effort normal and breath sounds normal. No respiratory distress. She has no wheezes. She has no rales.  Lymphadenopathy:    She has no cervical adenopathy.  Neurological: She is alert and oriented to person, place, and time. No cranial nerve deficit.          Assessment & Plan:  #1 history of recurrent depression improved on Celexa. Continue 20 mg daily. Continue counseling. We discussed that we could titrate this up further at this point she wishes to observe. Reassess in one month at follow-up. #2 acute sinusitis maxillary and frontal. Augmentin 875 mg twice daily for 10 days  #3 bilateral cerumen impactions. Removed with curette #4 atrophic vaginitis. Refill Estrace for twice weekly use

## 2014-10-11 NOTE — Progress Notes (Signed)
Pre visit review using our clinic review tool, if applicable. No additional management support is needed unless otherwise documented below in the visit note. 

## 2014-10-11 NOTE — Patient Instructions (Signed)

## 2014-10-18 ENCOUNTER — Telehealth: Payer: Self-pay | Admitting: Family Medicine

## 2014-10-18 MED ORDER — AZITHROMYCIN 250 MG PO TABS: ORAL_TABLET | ORAL | Status: DC

## 2014-10-18 NOTE — Telephone Encounter (Signed)
Rx sent to pharmacy. Pt is aware.  

## 2014-10-18 NOTE — Telephone Encounter (Signed)
Pt saw dr Elease Hashimoto on 2-1 and was prescribed augmentin and developed diarrhea and stop med. Pt only took meds  For 2 days. Pt would like another abx call into [facility]

## 2014-10-18 NOTE — Telephone Encounter (Signed)
Stop Augmentin. Start Zithromax for 5 days

## 2014-10-20 DIAGNOSIS — M546 Pain in thoracic spine: Secondary | ICD-10-CM | POA: Diagnosis not present

## 2014-10-20 DIAGNOSIS — M542 Cervicalgia: Secondary | ICD-10-CM | POA: Diagnosis not present

## 2014-10-29 DIAGNOSIS — M542 Cervicalgia: Secondary | ICD-10-CM | POA: Diagnosis not present

## 2014-10-29 DIAGNOSIS — M4802 Spinal stenosis, cervical region: Secondary | ICD-10-CM | POA: Diagnosis not present

## 2014-11-17 DIAGNOSIS — M5410 Radiculopathy, site unspecified: Secondary | ICD-10-CM | POA: Diagnosis not present

## 2014-11-17 DIAGNOSIS — M5412 Radiculopathy, cervical region: Secondary | ICD-10-CM | POA: Diagnosis not present

## 2014-11-19 ENCOUNTER — Ambulatory Visit: Payer: Medicare Other | Admitting: Family Medicine

## 2014-11-25 ENCOUNTER — Encounter: Payer: Self-pay | Admitting: Family Medicine

## 2014-11-25 ENCOUNTER — Ambulatory Visit (INDEPENDENT_AMBULATORY_CARE_PROVIDER_SITE_OTHER): Payer: Medicare Other | Admitting: Family Medicine

## 2014-11-25 DIAGNOSIS — R300 Dysuria: Secondary | ICD-10-CM | POA: Diagnosis not present

## 2014-11-25 LAB — POCT URINALYSIS DIPSTICK
Bilirubin, UA: NEGATIVE
GLUCOSE UA: NEGATIVE
Ketones, UA: NEGATIVE
NITRITE UA: NEGATIVE
PH UA: 5
Protein, UA: NEGATIVE
Spec Grav, UA: <=1.005
UROBILINOGEN UA: 0.2

## 2014-11-25 MED ORDER — CIPROFLOXACIN HCL 500 MG PO TABS: 500.0000 mg | ORAL_TABLET | Freq: Two times a day (BID) | ORAL | Status: DC

## 2014-11-25 NOTE — Patient Instructions (Signed)

## 2014-11-25 NOTE — Progress Notes (Signed)
Pre visit review using our clinic review tool, if applicable. No additional management support is needed unless otherwise documented below in the visit note. 

## 2014-11-25 NOTE — Progress Notes (Signed)
   Subjective:    Patient ID: [name], female    DOB: 11-Jul-1934, 81 y.o.   MRN: [id number]  HPI Patient seen with 2 day history of dysuria. Urine frequency and burning with urination. No flank pain. No nausea or vomiting. No fever. No gross hematuria. History of Klebsiella UTI back in August. None since then.  Past Medical History  Diagnosis Date  . Atrial enlargement, left   . Mitral regurgitation   . Mitral valve prolapse   . Palpitations   . Arthritis   . Depression   . GERD (gastroesophageal reflux disease)   . Allergy   . Hyperlipidemia   . Hypertension   . 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 PONV (postoperative nausea and vomiting)   . H/O hiatal hernia   . Heart murmur     06/10/2014 seeing new cardiologist  . Heart murmur   . Diabetes mellitus without complication     fasting cbg 110s  . Anxiety   . MCNOBSJG(283.6)    Past Surgical History  Procedure Laterality Date  . Anterior posterior and enterocele repairs  09/21/2004    With uterosacral cardinal colposuspension, partial colpocleisis; Selinda Orion, MD  . Breast enhancement surgery  02/25/2002    Bilateral reduction and excision of accessory breast tissue underneath the left breast; Aretha Parrot., MD  . Tonsillectomy and adenoidectomy    . Abdominal hysterectomy    . Cataract extraction Bilateral   . Breast surgery  11    reduction  . Rotator cuff repair Bilateral     11.12  . Back surgery    . Lumbar laminectomy/ decompression with met-rx N/A 03/16/2014    Procedure: LUMBAR FIVE-SACRAL ONE EXTRAFORAMINAL DISKECTOMY WITH METREX;  Surgeon: Kristeen Miss, MD;  Location: [place] [facility];  Service: Neurosurgery;  Laterality: N/A;  . Lumbar laminectomy/decompression microdiscectomy Left 06/08/2014    Procedure: Left Lumbar Five-Sacral One Microdiskectomy;  Surgeon: Kristeen Miss, MD;  Location: [place] [facility];  Service: Neurosurgery;  Laterality: Left;  Left L5-S1 Microdiskectomy    reports that she has never smoked.  She has never used smokeless tobacco. She reports that she drinks alcohol. She reports that she does not use illicit drugs. family history includes Arthritis in her father and mother; Heart disease in her father; Heart failure in her father; Hyperlipidemia in her father and mother; Hypertension in her mother. Allergies  Allergen Reactions  . Codeine     Facial swelling  . Cortisone     Insomnia, heart palpitations (po only)  . Sulfonamide Derivatives     As a child      Review of Systems  Constitutional: Negative for fever, chills and appetite change.  Gastrointestinal: Negative for nausea, vomiting, abdominal pain, diarrhea and constipation.  Genitourinary: Positive for dysuria and frequency.  Musculoskeletal: Negative for back pain.  Neurological: Negative for dizziness.       Objective:   Physical Exam  Constitutional: She appears well-developed and well-nourished. No distress.  Cardiovascular: Normal rate and regular rhythm.   Pulmonary/Chest: Effort normal and breath sounds normal. No respiratory distress. She has no wheezes. She has no rales.  Musculoskeletal:  No flank tenderness          Assessment & Plan:  Dysuria. Suspect uncomplicated cystitis. Urine culture sent. Cipro 500 mg twice a day for 7 days pending culture results.

## 2014-11-27 LAB — URINE CULTURE: Colony Count: 75000

## 2014-11-29 ENCOUNTER — Ambulatory Visit: Payer: Medicare Other | Admitting: Family Medicine

## 2014-11-29 DIAGNOSIS — M5410 Radiculopathy, site unspecified: Secondary | ICD-10-CM | POA: Diagnosis not present

## 2014-11-29 DIAGNOSIS — M542 Cervicalgia: Secondary | ICD-10-CM | POA: Diagnosis not present

## 2014-11-30 ENCOUNTER — Other Ambulatory Visit: Payer: Self-pay | Admitting: Family Medicine

## 2014-12-01 ENCOUNTER — Other Ambulatory Visit: Payer: Self-pay | Admitting: Family Medicine

## 2014-12-13 ENCOUNTER — Encounter: Payer: Self-pay | Admitting: Family Medicine

## 2014-12-13 ENCOUNTER — Ambulatory Visit (INDEPENDENT_AMBULATORY_CARE_PROVIDER_SITE_OTHER): Payer: Medicare Other | Admitting: Family Medicine

## 2014-12-13 VITALS — BP 118/72 | HR 83 | Temp 97.6°F | Wt 131.0 lb

## 2014-12-13 DIAGNOSIS — R300 Dysuria: Secondary | ICD-10-CM

## 2014-12-13 DIAGNOSIS — I10 Essential (primary) hypertension: Secondary | ICD-10-CM | POA: Diagnosis not present

## 2014-12-13 DIAGNOSIS — E1122 Type 2 diabetes mellitus with diabetic chronic kidney disease: Secondary | ICD-10-CM | POA: Insufficient documentation

## 2014-12-13 DIAGNOSIS — E119 Type 2 diabetes mellitus without complications: Secondary | ICD-10-CM | POA: Diagnosis not present

## 2014-12-13 LAB — HEMOGLOBIN A1C: Hgb A1c MFr Bld: 6.3 % (ref 4.6–6.5)

## 2014-12-13 LAB — POCT URINALYSIS DIPSTICK
BILIRUBIN UA: NEGATIVE
GLUCOSE UA: NEGATIVE
Ketones, UA: NEGATIVE
NITRITE UA: NEGATIVE
Protein, UA: NEGATIVE
Spec Grav, UA: 1.015
Urobilinogen, UA: 0.2
pH, UA: 6

## 2014-12-13 NOTE — Progress Notes (Signed)
Subjective:    Patient ID: [name], female    DOB: 09/13/1933, 81 y.o.   MRN: [id number]  HPI Patient here for follow-up regarding these issues:  Recurrent dysuria. She grew out enterococcus about a month ago. Those symptoms did resolve. She developed over the weekend some mild burning at urination and frequency. No fevers or chills. No nausea or vomiting.  Type 2 diabetes. History of good control. A1c 6.1% last fall. She feels that her blood sugars are elevated somewhat since then. She takes metformin. Fasting blood sugars usually 100-130 and postprandial sugars usually less than 160.  Hypertension treated with lisinopril HCTZ and diltiazem. Blood pressure stable. No dizziness. No recent headaches.  Past Medical History  Diagnosis Date  . Atrial enlargement, left   . Mitral regurgitation   . Mitral valve prolapse   . Palpitations   . Arthritis   . Depression   . GERD (gastroesophageal reflux disease)   . Allergy   . Hyperlipidemia   . Hypertension   . 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 PONV (postoperative nausea and vomiting)   . H/O hiatal hernia   . Heart murmur     06/10/2014 seeing new cardiologist  . Heart murmur   . Diabetes mellitus without complication     fasting cbg 110s  . Anxiety   . ULAGTXMI(680.3)    Past Surgical History  Procedure Laterality Date  . Anterior posterior and enterocele repairs  09/21/2004    With uterosacral cardinal colposuspension, partial colpocleisis; Selinda Orion, MD  . Breast enhancement surgery  02/25/2002    Bilateral reduction and excision of accessory breast tissue underneath the left breast; Aretha Parrot., MD  . Tonsillectomy and adenoidectomy    . Abdominal hysterectomy    . Cataract extraction Bilateral   . Breast surgery  11    reduction  . Rotator cuff repair Bilateral     11.12  . Back surgery    . Lumbar laminectomy/ decompression with met-rx N/A 03/16/2014    Procedure: LUMBAR FIVE-SACRAL ONE EXTRAFORAMINAL  DISKECTOMY WITH METREX;  Surgeon: Kristeen Miss, MD;  Location: [place] [facility];  Service: Neurosurgery;  Laterality: N/A;  . Lumbar laminectomy/decompression microdiscectomy Left 06/08/2014    Procedure: Left Lumbar Five-Sacral One Microdiskectomy;  Surgeon: Kristeen Miss, MD;  Location: [place] [place] [facility];  Service: Neurosurgery;  Laterality: Left;  Left L5-S1 Microdiskectomy    reports that she has never smoked. She has never used smokeless tobacco. She reports that she drinks alcohol. She reports that she does not use illicit drugs. family history includes Arthritis in her father and mother; Heart disease in her father; Heart failure in her father; Hyperlipidemia in her father and mother; Hypertension in her mother. Allergies  Allergen Reactions  . Codeine     Facial swelling  . Cortisone     Insomnia, heart palpitations (po only)  . Sulfonamide Derivatives     As a child      Review of Systems  Constitutional: Negative for fatigue and unexpected weight change.  Eyes: Negative for visual disturbance.  Respiratory: Negative for cough, chest tightness, shortness of breath and wheezing.   Cardiovascular: Negative for chest pain, palpitations and leg swelling.  Genitourinary: Positive for dysuria and frequency. Negative for hematuria.  Neurological: Negative for dizziness, seizures, syncope, weakness, light-headedness and headaches.       Objective:   Physical Exam  Constitutional: She appears well-developed and well-nourished. No distress.  Neck: Neck supple. No thyromegaly present.  Cardiovascular:  Normal rate and regular rhythm.   Pulmonary/Chest: Effort normal and breath sounds normal. No respiratory distress. She has no wheezes. She has no rales.  Musculoskeletal: She exhibits no edema.          Assessment & Plan:  #1 type 2 diabetes. History of good control. Recheck A1c. Reminder for yearly eye exam. #2 hypertension stable at goal. Continue current medications  #3 dysuria. Urine  dipstick 1+ leukocytes. Symptoms actually improved compared to yesterday. Urine culture sent.

## 2014-12-13 NOTE — Progress Notes (Signed)
Pre visit review using our clinic review tool, if applicable. No additional management support is needed unless otherwise documented below in the visit note. 

## 2014-12-15 ENCOUNTER — Telehealth: Payer: Self-pay | Admitting: Family Medicine

## 2014-12-15 MED ORDER — CIPROFLOXACIN HCL 250 MG PO TABS: 250.0000 mg | ORAL_TABLET | Freq: Two times a day (BID) | ORAL | Status: DC

## 2014-12-15 NOTE — Telephone Encounter (Signed)
Her cx grew out probable contaminant.  If she has recurrent symptoms Cipro 250 mg bid for 7 days.  If not improving with that we need to consider other possibilities such as atrophic vaginitis.

## 2014-12-15 NOTE — Telephone Encounter (Signed)
pt had urine culture done and advised to call dr and he would send in rx if she was not better.  Pt states she is still having symptoms/ Not better. [employer]

## 2014-12-15 NOTE — Telephone Encounter (Signed)
Pt is still having dysuria

## 2014-12-15 NOTE — Telephone Encounter (Signed)
Rx sent to pharmacy. Left message for patient to return call

## 2014-12-16 ENCOUNTER — Other Ambulatory Visit: Payer: Self-pay

## 2014-12-16 LAB — URINE CULTURE: Colony Count: >=100000

## 2014-12-16 MED ORDER — NITROFURANTOIN MONOHYD MACRO 100 MG PO CAPS: 100.0000 mg | ORAL_CAPSULE | Freq: Two times a day (BID) | ORAL | Status: DC

## 2014-12-20 ENCOUNTER — Other Ambulatory Visit: Payer: Self-pay | Admitting: Family Medicine

## 2014-12-29 ENCOUNTER — Telehealth: Payer: Self-pay | Admitting: Internal Medicine

## 2014-12-29 NOTE — Telephone Encounter (Signed)
Patient kept appointment for 01/28/15 She reports left inner ankle swollen (like an egg sitting there). Has some edema normally with her varicose veins but has been worse for about a month or longer.  Is getting support stockings to help.  Her foot when it is not elevated gets bluish color--not black. It is cool to touch but this is not a new finding. Has pain in left leg but also unchanged and mostly due she thinks to her DJD, scoliosis, prev back surgeries.  The swelling improves when she sleeps at night with legs elevated.  Pt does not restrict salt intake, rec to reduce use.  She is aware i am forwarding to Dr. Harrington Challenger for review and if any new recommendations we will call her.

## 2014-12-29 NOTE — Telephone Encounter (Signed)
Pt c/o swelling: STAT is pt has developed SOB within 24 hours  1. How long have you been experiencing swelling? Gotten worse in the left leg.. Just bought compression hose. Bump the size of egg above the left ankle  2. Where is the swelling located? Left ankle. Foot is turning black   3.  Are you currently taking a "fluid pill"? No, taking lisinopril and diltazem  4.  Are you currently SOB? No, just difficulty walking  5.  Have you traveled recently?No   Comments: Made follow up appt With Dr. Harrington Challenger. Pt states her foot is swelling turning black when she walks on it. Also the pt is requesting an Echocardiogram

## 2014-12-29 NOTE — Telephone Encounter (Signed)
Agree with limiting salt and support socks

## 2015-01-10 ENCOUNTER — Other Ambulatory Visit: Payer: Self-pay

## 2015-01-10 DIAGNOSIS — Z1231 Encounter for screening mammogram for malignant neoplasm of breast: Secondary | ICD-10-CM

## 2015-01-20 DIAGNOSIS — M5417 Radiculopathy, lumbosacral region: Secondary | ICD-10-CM | POA: Diagnosis not present

## 2015-01-20 DIAGNOSIS — M5127 Other intervertebral disc displacement, lumbosacral region: Secondary | ICD-10-CM | POA: Diagnosis not present

## 2015-01-28 ENCOUNTER — Ambulatory Visit (INDEPENDENT_AMBULATORY_CARE_PROVIDER_SITE_OTHER): Payer: Medicare Other | Admitting: Internal Medicine

## 2015-01-28 ENCOUNTER — Encounter: Payer: Self-pay | Admitting: Internal Medicine

## 2015-01-28 VITALS — BP 122/64 | HR 69 | Ht 66.0 in | Wt 124.8 lb

## 2015-01-28 DIAGNOSIS — I080 Rheumatic disorders of both mitral and aortic valves: Secondary | ICD-10-CM

## 2015-01-28 DIAGNOSIS — R609 Edema, unspecified: Secondary | ICD-10-CM | POA: Diagnosis not present

## 2015-01-28 DIAGNOSIS — I10 Essential (primary) hypertension: Secondary | ICD-10-CM

## 2015-01-28 LAB — BRAIN NATRIURETIC PEPTIDE: Pro B Natriuretic peptide (BNP): 68 pg/mL (ref 0.0–100.0)

## 2015-01-28 LAB — BASIC METABOLIC PANEL
BUN: 10 mg/dL (ref 6–23)
CO2: 27 mEq/L (ref 19–32)
Calcium: 9.6 mg/dL (ref 8.4–10.5)
Chloride: 98 mEq/L (ref 96–112)
Creatinine, Ser: 0.56 mg/dL (ref 0.40–1.20)
GFR: 110.94 mL/min (ref >60.00)
GLUCOSE: 99 mg/dL (ref 70–99)
POTASSIUM: 3.6 meq/L (ref 3.5–5.1)
Sodium: 134 mEq/L — ABNORMAL LOW (ref 135–145)

## 2015-01-28 MED ORDER — LISINOPRIL-HYDROCHLOROTHIAZIDE 10-12.5 MG PO TABS: 1.0000 | ORAL_TABLET | Freq: Every day | ORAL | Status: DC

## 2015-01-28 MED ORDER — DILTIAZEM HCL ER 240 MG PO CP24: 240.0000 mg | ORAL_CAPSULE | Freq: Every day | ORAL | Status: DC

## 2015-01-28 NOTE — Patient Instructions (Signed)
Your physician recommends that you continue on your current medications as directed. Please refer to the Current Medication list given to you today. Your physician recommends that you have lab work today to check your BMET and BNP. Your physician has requested that you have an echocardiogram. Echocardiography is a painless test that uses sound waves to create images of your heart. It provides your doctor with information about the size and shape of your heart and how well your heart's chambers and valves are working. This procedure takes approximately one hour. There are no restrictions for this procedure. Your physician recommends that you schedule a follow-up appointment in: 6 months. You will receive a reminder letter in the mail in about 4 months reminding you to call and schedule your appointment. If you don't receive this letter, please contact our office.

## 2015-01-28 NOTE — Progress Notes (Signed)
 Cardiology Office Note   Date:  01/28/2015   ID:  [name], DOB 04/21/1934, MRN [id number]  PCP:  BURCHETTE,BRUCE W, MD  Cardiologist:   Paula Ross, MD   No chief complaint on file.     History of Present Illness: [name] is a 81 y.o. female with a history of HTN and MVP/MR  And palpitations  I saw her in October SInce seen she denies CP  Has occaional palpitations.  No SOB   Does note LE edemia  L greater than R  Likes salty things.  Uses TED hose on L      Current Outpatient Prescriptions  Medication Sig Dispense Refill  . Cholecalciferol (VITAMIN D) 1000 UNITS capsule Take 1,000 Units by mouth daily.      . citalopram (CELEXA) 20 MG tablet Take 1 tablet by mouth   (20mg total) daily 90 tablet 1  . clonazePAM (KLONOPIN) 1 MG tablet TAKE 1 TABLET BY MOUTH EVERY NIGHT AT BEDTIME AS DIRECTED 30 tablet 5  . diltiazem (DILACOR XR) 240 MG 24 hr capsule Take 1 capsule (240 mg total) by mouth daily. 90 capsule 3  . fish oil-omega-3 fatty acids 1000 MG capsule Take 1 g by mouth daily.      . fluticasone (FLONASE) 50 MCG/ACT nasal spray Place 2 sprays into both nostrils daily as needed for allergies. 16 g 5  . gabapentin (NEURONTIN) 100 MG capsule TAKE ONE CAPSULE BY MOUTH THREE TIMES DAILY. 90 capsule 1  . ibuprofen (ADVIL,MOTRIN) 200 MG tablet Take 400 mg by mouth every 6 (six) hours as needed for mild pain.     . lisinopril-hydrochlorothiazide (PRINZIDE,ZESTORETIC) 10-12.5 MG per tablet Take 1 tablet by mouth daily. 90 tablet 3  . metFORMIN (GLUCOPHAGE) 500 MG tablet Take 2 tablets by mouth  every morning and 1 tablet  by mouth before supper 270 tablet 2  . ONETOUCH DELICA LANCETS 33G MISC TEST TWICE DAILY AS DIRECTED 100 each 3  . ONETOUCH VERIO test strip CHECK BLOOD GLUCOSE TWICE DAILY AS DIRECTED 100 each 3  . triamcinolone cream (KENALOG) 0.1 % Apply 1 application topically 2 (two) times daily. 30 g 0  . [DISCONTINUED] fenofibrate (TRICOR) 145 MG tablet Take 1 tablet  (145 mg total) by mouth daily. 90 tablet 3   No current facility-administered medications for this visit.    Allergies:   Codeine; Cortisone; and Sulfonamide derivatives   Past Medical History  Diagnosis Date  . Atrial enlargement, left   . Mitral regurgitation   . Mitral valve prolapse   . Palpitations   . Arthritis   . Depression   . GERD (gastroesophageal reflux disease)   . Allergy   . Hyperlipidemia   . Hypertension   . 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 PONV (postoperative nausea and vomiting)   . H/O hiatal hernia   . Heart murmur     06/10/2014 seeing new cardiologist  . Heart murmur   . Diabetes mellitus without complication     fasting cbg 110s  . Anxiety   . Headache(784.0)     Past Surgical History  Procedure Laterality Date  . Anterior posterior and enterocele repairs  09/21/2004    With uterosacral cardinal colposuspension, partial colpocleisis; Charles W. Lomax, MD  . Breast enhancement surgery  02/25/2002    Bilateral reduction and excision of accessory breast tissue underneath the left breast; Howard Holderness Jr., MD  . Tonsillectomy and adenoidectomy    . Abdominal hysterectomy    .   Cataract extraction Bilateral   . Breast surgery  11    reduction  . Rotator cuff repair Bilateral     11.12  . Back surgery    . Lumbar laminectomy/ decompression with met-rx N/A 03/16/2014    Procedure: LUMBAR FIVE-SACRAL ONE EXTRAFORAMINAL DISKECTOMY WITH METREX;  Surgeon: Henry Elsner, MD;  Location: [facility];  Service: Neurosurgery;  Laterality: N/A;  . Lumbar laminectomy/decompression microdiscectomy Left 06/08/2014    Procedure: Left Lumbar Five-Sacral One Microdiskectomy;  Surgeon: Henry Elsner, MD;  Location: [facility];  Service: Neurosurgery;  Laterality: Left;  Left L5-S1 Microdiskectomy     Social History:  The patient  reports that she has never smoked. She has never used smokeless tobacco. She reports that she drinks alcohol. She reports that she does not use  illicit drugs.   Family History:  The patient's family history includes Arthritis in her father and mother; Heart disease in her father; Heart failure in her father; Hyperlipidemia in her father and mother; Hypertension in her mother.    ROS:  Please see the history of present illness. All other systems are reviewed and  Negative to the above problem except as noted.    PHYSICAL EXAM: VS:  BP 122/64 mmHg  Pulse 69  Ht 5' 6" (1.676 m)  Wt 124 lb 12.8 oz (56.609 kg)  BMI 20.15 kg/m2  GEN: Well nourished, well developed, in no acute distress HEENT: normal Neck: no JVD, carotid bruits, or masses Cardiac: RRR; no murmurs, rubs, or gallops,no edema  Respiratory:  clear to auscultation bilaterally, normal work of breathing GI: soft, nontender, nondistended, + BS  No hepatomegaly  MS: no deformity Moving all extremities   Skin: warm and dry, no rash Neuro:  Strength and sensation are intact Psych: euthymic mood, full affect   EKG:  EKG is not ordered today.   Lipid Panel    Component Value Date/Time   CHOL 195 05/21/2014 0953   TRIG 141.0 05/21/2014 0953   HDL 52.80 05/21/2014 0953   CHOLHDL 4 05/21/2014 0953   VLDL 28.2 05/21/2014 0953   LDLCALC 114* 05/21/2014 0953   LDLDIRECT 143.6 06/04/2012 0931      Wt Readings from Last 3 Encounters:  01/28/15 124 lb 12.8 oz (56.609 kg)  12/13/14 131 lb (59.421 kg)  11/25/14 130 lb (58.968 kg)      ASSESSMENT AND PLAN: 1  HTN  Good control    2.  MVP /MR  No murmur on exam    3.  Edema  Will check labs today  Set up for echo   Volume may be minimaly increased  Edema is prob from venous insuff  Would recomm she cut back on salt      Disposition:   FU with  Me in 6 months    Signed, Paula Ross, MD  01/28/2015 10:02 AM    [facility] [address], [place], [place]  [address] Phone: [phone]; Fax: [phone]     

## 2015-02-03 DIAGNOSIS — M5126 Other intervertebral disc displacement, lumbar region: Secondary | ICD-10-CM | POA: Diagnosis not present

## 2015-02-09 DIAGNOSIS — M5126 Other intervertebral disc displacement, lumbar region: Secondary | ICD-10-CM | POA: Diagnosis not present

## 2015-02-11 DIAGNOSIS — M5126 Other intervertebral disc displacement, lumbar region: Secondary | ICD-10-CM | POA: Diagnosis not present

## 2015-02-16 DIAGNOSIS — M5126 Other intervertebral disc displacement, lumbar region: Secondary | ICD-10-CM | POA: Diagnosis not present

## 2015-02-17 ENCOUNTER — Other Ambulatory Visit: Payer: Self-pay | Admitting: Neurological Surgery

## 2015-02-22 ENCOUNTER — Ambulatory Visit (HOSPITAL_COMMUNITY): Payer: Medicare Other | Attending: Cardiology

## 2015-02-22 ENCOUNTER — Other Ambulatory Visit: Payer: Self-pay

## 2015-02-22 DIAGNOSIS — I340 Nonrheumatic mitral (valve) insufficiency: Secondary | ICD-10-CM | POA: Insufficient documentation

## 2015-02-22 DIAGNOSIS — R609 Edema, unspecified: Secondary | ICD-10-CM

## 2015-02-22 DIAGNOSIS — I517 Cardiomegaly: Secondary | ICD-10-CM | POA: Insufficient documentation

## 2015-02-22 DIAGNOSIS — I080 Rheumatic disorders of both mitral and aortic valves: Secondary | ICD-10-CM | POA: Diagnosis not present

## 2015-02-22 DIAGNOSIS — I341 Nonrheumatic mitral (valve) prolapse: Secondary | ICD-10-CM | POA: Diagnosis not present

## 2015-02-25 ENCOUNTER — Encounter (HOSPITAL_COMMUNITY): Payer: Self-pay

## 2015-02-25 ENCOUNTER — Encounter (HOSPITAL_COMMUNITY)
Admission: RE | Admit: 2015-02-25 | Discharge: 2015-02-25 | Disposition: A | Discharge status: Home / Self Care | Payer: Medicare Other | Source: Ambulatory Visit | Attending: Neurological Surgery | Admitting: Neurological Surgery

## 2015-02-25 DIAGNOSIS — E785 Hyperlipidemia, unspecified: Secondary | ICD-10-CM | POA: Diagnosis not present

## 2015-02-25 DIAGNOSIS — E119 Type 2 diabetes mellitus without complications: Secondary | ICD-10-CM | POA: Insufficient documentation

## 2015-02-25 DIAGNOSIS — Z0183 Encounter for blood typing: Secondary | ICD-10-CM | POA: Insufficient documentation

## 2015-02-25 DIAGNOSIS — I10 Essential (primary) hypertension: Secondary | ICD-10-CM | POA: Diagnosis not present

## 2015-02-25 DIAGNOSIS — Z01818 Encounter for other preprocedural examination: Secondary | ICD-10-CM | POA: Diagnosis not present

## 2015-02-25 DIAGNOSIS — I340 Nonrheumatic mitral (valve) insufficiency: Secondary | ICD-10-CM | POA: Diagnosis not present

## 2015-02-25 DIAGNOSIS — Z01812 Encounter for preprocedural laboratory examination: Secondary | ICD-10-CM | POA: Diagnosis not present

## 2015-02-25 LAB — CBC
HEMATOCRIT: 41.3 % (ref 36.0–46.0)
Hemoglobin: 13.9 g/dL (ref 12.0–15.0)
MCH: 31.7 pg (ref 26.0–34.0)
MCHC: 33.7 g/dL (ref 30.0–36.0)
MCV: 94.3 fL (ref 78.0–100.0)
PLATELETS: 318 10*3/uL (ref 150–400)
RBC: 4.38 MIL/uL (ref 3.87–5.11)
RDW: 13.5 % (ref 11.5–15.5)
WBC: 6 10*3/uL (ref 4.0–10.5)

## 2015-02-25 LAB — BASIC METABOLIC PANEL
ANION GAP: 12 (ref 5–15)
BUN: 10 mg/dL (ref 6–20)
CALCIUM: 9.9 mg/dL (ref 8.9–10.3)
CHLORIDE: 102 mmol/L (ref 101–111)
CO2: 24 mmol/L (ref 22–32)
Creatinine, Ser: 0.53 mg/dL (ref 0.44–1.00)
GFR calc Af Amer: >60 mL/min (ref >60)
GFR calc non Af Amer: >60 mL/min (ref >60)
Glucose, Bld: 116 mg/dL — ABNORMAL HIGH (ref 65–99)
Potassium: 4 mmol/L (ref 3.5–5.1)
SODIUM: 138 mmol/L (ref 135–145)

## 2015-02-25 LAB — SURGICAL PCR SCREEN
MRSA, PCR: NEGATIVE
STAPHYLOCOCCUS AUREUS: NEGATIVE

## 2015-02-25 LAB — GLUCOSE, CAPILLARY: Glucose-Capillary: 98 mg/dL (ref 65–99)

## 2015-02-25 LAB — TYPE AND SCREEN
ABO/RH(D): O NEG
ANTIBODY SCREEN: NEGATIVE

## 2015-02-25 NOTE — Progress Notes (Addendum)
Patient sees Dr. Dorris Carnes, LOV  2-weeks ago, she had ECHO and EKG.  PMH mitral regurge.  Denies any issues at the moment.   PCP is Dr. Elease Hashimoto, LOV 2 mths ago.

## 2015-02-25 NOTE — Pre-Procedure Instructions (Addendum)
[name]  02/25/2015      [id number] - [place], [place] - [address] AT [facility] OF [address] [place] [address] Phone: [phone] Fax: [phone]  [place], [place] [address] [place] [address] [place] [address] Phone: [phone] Fax: [phone]    Your procedure is scheduled on TUESDAY, jUNE 28TH   Report to [place] at  5:30 A.M.   Call this number if you have problems the morning of surgery:  [phone]   Remember:  Do not eat food or drink liquids after midnight Monday .  Take these medicines the morning of surgery with A SIP OF WATER : Celexa, Hydrocodone, Protonix.   Use Flonase   Do not wear jewelry, make-up or nail polish.  Do not wear lotions, powders, or perfumes.  You may NOT wear deodorant the day of surgery.  Do not shave 48 hours prior to surgery.     Do not bring valuables to the hospital.  [facility] is not responsible for any belongings or valuables.  Contacts, dentures or bridgework may not be worn into surgery.  Leave your suitcase in the car.  After surgery it may be brought to your room. For patients admitted to the hospital, discharge time will be determined by your treatment team.  Name and phone number of your driver:   [place] Special instructions:   Please read over the following fact sheets that you were given. Pain Booklet, Coughing and Deep Breathing, Blood Transfusion Information, MRSA Information and Surgical Site Infection Prevention

## 2015-02-28 ENCOUNTER — Telehealth: Payer: Self-pay | Admitting: Internal Medicine

## 2015-02-28 NOTE — Telephone Encounter (Signed)
Ne wMessage  Pt calling about echo results. Please call back and discuss.

## 2015-02-28 NOTE — Telephone Encounter (Signed)
Spoke with pt and informed her that Dr. Harrington Challenger has not reviewed her echo yet but that we would call her with those results as soon as she had. Pt states that she is going into the hospital on 6/28 for back surgery for fusion and wanted to make Dr. Harrington Challenger aware of this. Informed pt that I would route this information to Dr. Harrington Challenger to make her aware.

## 2015-02-28 NOTE — Progress Notes (Addendum)
Anesthesia Chart Review:  Pt is 81 year old female scheduled for L5-S1 PLIF on 03/08/2015 with Dr. Ellene Route.   PCP is Dr. Elease Hashimoto. Cardiologist is Dr. Harrington Challenger.   PMH includes: mitral regurgitation, MVP, HTN, hyperlipidemia, DM, palpitations. Never smoker. BMI 21. S/p L5-S1 microdiskectomy 06/08/14. S/p L5-S1 extraforaminal diskecotmy 03/16/14.  Preoperative labs reviewed.    Chest x-ray 03/08/2014 reviewed. No acute cardiopulmonary abnormality.   EKG 01/28/2015: NSR. Septal infarct, age undetermined.   Echo 02/22/2015: - Left ventricle: The cavity size was normal. There was mild focal basal hypertrophy of the septum. Systolic function was vigorous. The estimated ejection fraction was in the range of 65% to 70%. Wall motion was normal; there were no regional wall motion abnormalities. - Mitral valve: Significant myxomaous mitral valve changes with severe bileaflet thickening and prolapse. There is at least moderate mitral regurgitation. There was moderate regurgitation directed centrally and posteriorly. Valve area by continuity equation (using LVOT flow): 1.75 cm^2. - Left atrium: The atrium was severely dilated. - Right ventricle: The cavity size was normal. Wall thickness was normal. Systolic function was normal. Impressions:- Significant myxomaous mitral valve changes with severe bileaflet thickening and prolapse. Left atrium is severely dilated. There is at least moderate mitral regurgitation. If clinically indication, a TEE is recommended for further quantification.  Dr. Harrington Challenger has cleared pt for surgery from cardiac standpoint.   If no changes, I anticipate pt can proceed with surgery as scheduled.   Willeen Cass, FNP-BC [facility]/Anesthesiology Phone: [phone] 03/02/2015 2:04 PM

## 2015-03-03 ENCOUNTER — Ambulatory Visit: Payer: Medicare Other

## 2015-03-07 MED ORDER — CEFAZOLIN SODIUM-DEXTROSE 2-3 GM-% IV SOLR
2.0000 g | INTRAVENOUS | Status: AC
  Administered 2015-03-08: 2 g via INTRAVENOUS
  Filled 2015-03-07: qty 50

## 2015-03-07 NOTE — Anesthesia Preprocedure Evaluation (Addendum)
Anesthesia Evaluation  Patient identified by MRN, date of birth, ID band Patient awake    Reviewed: Allergy & Precautions, NPO status , Patient's Chart, lab work & pertinent test results, reviewed documented beta blocker date and time   History of Anesthesia Complications (+) PONV  Airway Mallampati: II   Neck ROM: Full    Dental  (+) Caps, Teeth Intact, Dental Advisory Given,    Pulmonary  breath sounds clear to auscultation        Cardiovascular hypertension, Pt. on medications Rhythm:Regular  ECHO 02/2015 EF 70%   Neuro/Psych  Headaches, Anxiety Depression    GI/Hepatic hiatal hernia, GERD-  Medicated,  Endo/Other  diabetes, Well Controlled, Type 2  Renal/GU      Musculoskeletal   Abdominal (+)  Abdomen: soft.    Peds  Hematology 13/41   Anesthesia Other Findings   Reproductive/Obstetrics                            Anesthesia Physical Anesthesia Plan  ASA: III  Anesthesia Plan: General   Post-op Pain Management:    Induction: Intravenous  Airway Management Planned: Oral ETT  Additional Equipment:   Intra-op Plan:   Post-operative Plan: Extubation in OR  Informed Consent: I have reviewed the patients History and Physical, chart, labs and discussed the procedure including the risks, benefits and alternatives for the proposed anesthesia with the patient or authorized representative who has indicated his/her understanding and acceptance.     Plan Discussed with:   Anesthesia Plan Comments: (Multimodal pain RX)        Anesthesia Quick Evaluation

## 2015-03-08 ENCOUNTER — Inpatient Hospital Stay (HOSPITAL_COMMUNITY): Payer: Medicare Other

## 2015-03-08 ENCOUNTER — Inpatient Hospital Stay (HOSPITAL_COMMUNITY)
Admission: RE | Admit: 2015-03-08 | Discharge: 2015-03-10 | DRG: 460 | Disposition: A | Discharge status: Home / Self Care | Payer: Medicare Other | Source: Ambulatory Visit | Attending: Neurological Surgery | Admitting: Neurological Surgery

## 2015-03-08 ENCOUNTER — Inpatient Hospital Stay (HOSPITAL_COMMUNITY): Payer: Medicare Other | Admitting: Emergency Medicine

## 2015-03-08 ENCOUNTER — Encounter (HOSPITAL_COMMUNITY)
Admission: RE | Disposition: A | Discharge status: Home / Self Care | Payer: Medicare Other | Source: Ambulatory Visit | Attending: Neurological Surgery

## 2015-03-08 ENCOUNTER — Encounter (HOSPITAL_COMMUNITY): Payer: Self-pay

## 2015-03-08 ENCOUNTER — Inpatient Hospital Stay (HOSPITAL_COMMUNITY): Payer: Medicare Other | Admitting: Anesthesiology

## 2015-03-08 DIAGNOSIS — Z885 Allergy status to narcotic agent status: Secondary | ICD-10-CM

## 2015-03-08 DIAGNOSIS — E119 Type 2 diabetes mellitus without complications: Secondary | ICD-10-CM | POA: Diagnosis present

## 2015-03-08 DIAGNOSIS — F419 Anxiety disorder, unspecified: Secondary | ICD-10-CM | POA: Diagnosis not present

## 2015-03-08 DIAGNOSIS — Z888 Allergy status to other drugs, medicaments and biological substances status: Secondary | ICD-10-CM

## 2015-03-08 DIAGNOSIS — Z882 Allergy status to sulfonamides status: Secondary | ICD-10-CM

## 2015-03-08 DIAGNOSIS — Z981 Arthrodesis status: Secondary | ICD-10-CM | POA: Diagnosis not present

## 2015-03-08 DIAGNOSIS — M47816 Spondylosis without myelopathy or radiculopathy, lumbar region: Secondary | ICD-10-CM | POA: Diagnosis present

## 2015-03-08 DIAGNOSIS — E785 Hyperlipidemia, unspecified: Secondary | ICD-10-CM | POA: Diagnosis present

## 2015-03-08 DIAGNOSIS — M5137 Other intervertebral disc degeneration, lumbosacral region: Secondary | ICD-10-CM | POA: Diagnosis not present

## 2015-03-08 DIAGNOSIS — I340 Nonrheumatic mitral (valve) insufficiency: Secondary | ICD-10-CM | POA: Diagnosis not present

## 2015-03-08 DIAGNOSIS — M5126 Other intervertebral disc displacement, lumbar region: Secondary | ICD-10-CM | POA: Diagnosis not present

## 2015-03-08 DIAGNOSIS — I341 Nonrheumatic mitral (valve) prolapse: Secondary | ICD-10-CM | POA: Diagnosis present

## 2015-03-08 DIAGNOSIS — F329 Major depressive disorder, single episode, unspecified: Secondary | ICD-10-CM | POA: Diagnosis present

## 2015-03-08 DIAGNOSIS — M5117 Intervertebral disc disorders with radiculopathy, lumbosacral region: Principal | ICD-10-CM | POA: Diagnosis present

## 2015-03-08 DIAGNOSIS — M4327 Fusion of spine, lumbosacral region: Secondary | ICD-10-CM | POA: Diagnosis not present

## 2015-03-08 DIAGNOSIS — M4326 Fusion of spine, lumbar region: Secondary | ICD-10-CM | POA: Diagnosis not present

## 2015-03-08 DIAGNOSIS — M79605 Pain in left leg: Secondary | ICD-10-CM | POA: Diagnosis present

## 2015-03-08 DIAGNOSIS — K219 Gastro-esophageal reflux disease without esophagitis: Secondary | ICD-10-CM | POA: Diagnosis not present

## 2015-03-08 DIAGNOSIS — M5127 Other intervertebral disc displacement, lumbosacral region: Secondary | ICD-10-CM | POA: Diagnosis not present

## 2015-03-08 DIAGNOSIS — Z419 Encounter for procedure for purposes other than remedying health state, unspecified: Secondary | ICD-10-CM

## 2015-03-08 DIAGNOSIS — M47896 Other spondylosis, lumbar region: Secondary | ICD-10-CM | POA: Diagnosis not present

## 2015-03-08 DIAGNOSIS — I10 Essential (primary) hypertension: Secondary | ICD-10-CM | POA: Diagnosis present

## 2015-03-08 LAB — GLUCOSE, CAPILLARY
GLUCOSE-CAPILLARY: 103 mg/dL — AB (ref 65–99)
Glucose-Capillary: 161 mg/dL — ABNORMAL HIGH (ref 65–99)

## 2015-03-08 SURGERY — POSTERIOR LUMBAR FUSION 1 LEVEL
Anesthesia: General | Site: Back

## 2015-03-08 MED ORDER — PHENYLEPHRINE HCL 10 MG/ML IJ SOLN
INTRAMUSCULAR | Status: DC | PRN
  Administered 2015-03-08 (×5): 40 ug via INTRAVENOUS

## 2015-03-08 MED ORDER — METFORMIN HCL 500 MG PO TABS
1000.0000 mg | ORAL_TABLET | Freq: Two times a day (BID) | ORAL | Status: DC
  Administered 2015-03-08 – 2015-03-10 (×4): 1000 mg via ORAL
  Filled 2015-03-08 (×4): qty 2

## 2015-03-08 MED ORDER — THROMBIN 5000 UNITS EX SOLR
OROMUCOSAL | Status: DC | PRN
  Administered 2015-03-08: 5 mL via TOPICAL

## 2015-03-08 MED ORDER — METHOCARBAMOL 1000 MG/10ML IJ SOLN
500.0000 mg | Freq: Four times a day (QID) | INTRAMUSCULAR | Status: DC | PRN
  Filled 2015-03-08: qty 5

## 2015-03-08 MED ORDER — HYDROCODONE-ACETAMINOPHEN 5-325 MG PO TABS
1.0000 | ORAL_TABLET | ORAL | Status: DC | PRN
  Administered 2015-03-08 – 2015-03-09 (×3): 2 via ORAL
  Administered 2015-03-09 – 2015-03-10 (×4): 1 via ORAL
  Filled 2015-03-08: qty 1
  Filled 2015-03-08 (×4): qty 2
  Filled 2015-03-08: qty 1

## 2015-03-08 MED ORDER — ACETAMINOPHEN 650 MG RE SUPP: 650.0000 mg | RECTAL | Status: DC | PRN

## 2015-03-08 MED ORDER — LISINOPRIL-HYDROCHLOROTHIAZIDE 10-12.5 MG PO TABS: 1.0000 | ORAL_TABLET | Freq: Every day | ORAL | Status: DC

## 2015-03-08 MED ORDER — FLEET ENEMA 7-19 GM/118ML RE ENEM: 1.0000 | ENEMA | Freq: Once | RECTAL | Status: AC | PRN

## 2015-03-08 MED ORDER — NEOSTIGMINE METHYLSULFATE 10 MG/10ML IV SOLN
INTRAVENOUS | Status: AC
  Filled 2015-03-08: qty 1

## 2015-03-08 MED ORDER — PROMETHAZINE HCL 25 MG/ML IJ SOLN: 6.2500 mg | INTRAMUSCULAR | Status: DC | PRN

## 2015-03-08 MED ORDER — BISACODYL 10 MG RE SUPP: 10.0000 mg | Freq: Every day | RECTAL | Status: DC | PRN

## 2015-03-08 MED ORDER — SENNA 8.6 MG PO TABS
1.0000 | ORAL_TABLET | Freq: Two times a day (BID) | ORAL | Status: DC
  Administered 2015-03-08 – 2015-03-10 (×5): 8.6 mg via ORAL
  Filled 2015-03-08 (×5): qty 1

## 2015-03-08 MED ORDER — 0.9 % SODIUM CHLORIDE (POUR BTL) OPTIME
TOPICAL | Status: DC | PRN
  Administered 2015-03-08: 1000 mL

## 2015-03-08 MED ORDER — FLUTICASONE PROPIONATE 50 MCG/ACT NA SUSP: 1.0000 | Freq: Every day | NASAL | Status: DC | PRN

## 2015-03-08 MED ORDER — PHENYLEPHRINE 40 MCG/ML (10ML) SYRINGE FOR IV PUSH (FOR BLOOD PRESSURE SUPPORT)
PREFILLED_SYRINGE | INTRAVENOUS | Status: AC
  Filled 2015-03-08: qty 10

## 2015-03-08 MED ORDER — NEOSTIGMINE METHYLSULFATE 10 MG/10ML IV SOLN
INTRAVENOUS | Status: DC | PRN
  Administered 2015-03-08: 3 mg via INTRAVENOUS

## 2015-03-08 MED ORDER — BUPIVACAINE HCL (PF) 0.5 % IJ SOLN
INTRAMUSCULAR | Status: DC | PRN
  Administered 2015-03-08: 30 mL

## 2015-03-08 MED ORDER — PHENOL 1.4 % MT LIQD: 1.0000 | OROMUCOSAL | Status: DC | PRN

## 2015-03-08 MED ORDER — ARTIFICIAL TEARS OP OINT
TOPICAL_OINTMENT | OPHTHALMIC | Status: AC
  Filled 2015-03-08: qty 3.5

## 2015-03-08 MED ORDER — METHOCARBAMOL 500 MG PO TABS
500.0000 mg | ORAL_TABLET | Freq: Four times a day (QID) | ORAL | Status: DC | PRN
  Administered 2015-03-09 – 2015-03-10 (×2): 500 mg via ORAL
  Filled 2015-03-08 (×3): qty 1

## 2015-03-08 MED ORDER — GLYCOPYRROLATE 0.2 MG/ML IJ SOLN
INTRAMUSCULAR | Status: AC
  Filled 2015-03-08: qty 4

## 2015-03-08 MED ORDER — POLYETHYLENE GLYCOL 3350 17 G PO PACK
17.0000 g | PACK | Freq: Every day | ORAL | Status: DC | PRN
Start: 2015-03-08 — End: 2015-03-10
  Administered 2015-03-09: 17 g via ORAL
  Filled 2015-03-08: qty 1

## 2015-03-08 MED ORDER — DIPHENHYDRAMINE HCL 25 MG PO CAPS
25.0000 mg | ORAL_CAPSULE | ORAL | Status: DC | PRN
  Administered 2015-03-08: 25 mg via ORAL
  Filled 2015-03-08: qty 1

## 2015-03-08 MED ORDER — OXYCODONE-ACETAMINOPHEN 5-325 MG PO TABS: 1.0000 | ORAL_TABLET | ORAL | Status: DC | PRN

## 2015-03-08 MED ORDER — FENTANYL CITRATE (PF) 100 MCG/2ML IJ SOLN
25.0000 ug | INTRAMUSCULAR | Status: DC | PRN
  Administered 2015-03-08: 50 ug via INTRAVENOUS
  Administered 2015-03-08: 25 ug via INTRAVENOUS

## 2015-03-08 MED ORDER — ARTIFICIAL TEARS OP OINT
TOPICAL_OINTMENT | OPHTHALMIC | Status: DC | PRN
  Administered 2015-03-08: 1 via OPHTHALMIC

## 2015-03-08 MED ORDER — FENTANYL CITRATE (PF) 100 MCG/2ML IJ SOLN
INTRAMUSCULAR | Status: DC | PRN
  Administered 2015-03-08 (×5): 50 ug via INTRAVENOUS

## 2015-03-08 MED ORDER — ROCURONIUM BROMIDE 50 MG/5ML IV SOLN
INTRAVENOUS | Status: AC
  Filled 2015-03-08: qty 2

## 2015-03-08 MED ORDER — VECURONIUM BROMIDE 10 MG IV SOLR
INTRAVENOUS | Status: AC
  Filled 2015-03-08: qty 10

## 2015-03-08 MED ORDER — BACITRACIN 50000 UNITS IM SOLR
INTRAMUSCULAR | Status: DC | PRN
  Administered 2015-03-08: 500 mL

## 2015-03-08 MED ORDER — PROPOFOL 10 MG/ML IV BOLUS
INTRAVENOUS | Status: AC
  Filled 2015-03-08: qty 20

## 2015-03-08 MED ORDER — ACETAMINOPHEN 325 MG PO TABS: 650.0000 mg | ORAL_TABLET | ORAL | Status: DC | PRN

## 2015-03-08 MED ORDER — LIDOCAINE-EPINEPHRINE 1 %-1:100000 IJ SOLN
INTRAMUSCULAR | Status: DC | PRN
  Administered 2015-03-08: 5 mL

## 2015-03-08 MED ORDER — PANTOPRAZOLE SODIUM 40 MG PO TBEC
40.0000 mg | DELAYED_RELEASE_TABLET | Freq: Every day | ORAL | Status: DC
  Administered 2015-03-08 – 2015-03-09 (×2): 40 mg via ORAL
  Filled 2015-03-08 (×2): qty 1

## 2015-03-08 MED ORDER — ONDANSETRON HCL 4 MG/2ML IJ SOLN
INTRAMUSCULAR | Status: AC
  Filled 2015-03-08: qty 2

## 2015-03-08 MED ORDER — SODIUM CHLORIDE 0.9 % IV SOLN
INTRAVENOUS | Status: DC
  Administered 2015-03-08 – 2015-03-09 (×2): via INTRAVENOUS

## 2015-03-08 MED ORDER — LIDOCAINE HCL (CARDIAC) 20 MG/ML IV SOLN
INTRAVENOUS | Status: DC | PRN
  Administered 2015-03-08: 100 mg via INTRAVENOUS

## 2015-03-08 MED ORDER — EPHEDRINE SULFATE 50 MG/ML IJ SOLN
INTRAMUSCULAR | Status: AC
  Filled 2015-03-08: qty 1

## 2015-03-08 MED ORDER — SODIUM CHLORIDE 0.9 % IJ SOLN: 3.0000 mL | INTRAMUSCULAR | Status: DC | PRN

## 2015-03-08 MED ORDER — LIDOCAINE HCL (CARDIAC) 20 MG/ML IV SOLN
INTRAVENOUS | Status: AC
  Filled 2015-03-08: qty 5

## 2015-03-08 MED ORDER — HYDROCHLOROTHIAZIDE 12.5 MG PO CAPS
12.5000 mg | ORAL_CAPSULE | Freq: Every day | ORAL | Status: DC
  Administered 2015-03-10: 12.5 mg via ORAL
  Filled 2015-03-08 (×2): qty 1

## 2015-03-08 MED ORDER — ROCURONIUM BROMIDE 100 MG/10ML IV SOLN
INTRAVENOUS | Status: DC | PRN
  Administered 2015-03-08 (×4): 10 mg via INTRAVENOUS
  Administered 2015-03-08: 40 mg via INTRAVENOUS
  Administered 2015-03-08 (×2): 10 mg via INTRAVENOUS

## 2015-03-08 MED ORDER — FENTANYL CITRATE (PF) 250 MCG/5ML IJ SOLN
INTRAMUSCULAR | Status: AC
  Filled 2015-03-08: qty 5

## 2015-03-08 MED ORDER — VECURONIUM BROMIDE 10 MG IV SOLR
INTRAVENOUS | Status: DC | PRN
  Administered 2015-03-08: 1 mg via INTRAVENOUS

## 2015-03-08 MED ORDER — ALUM & MAG HYDROXIDE-SIMETH 200-200-20 MG/5ML PO SUSP
30.0000 mL | Freq: Four times a day (QID) | ORAL | Status: DC | PRN
  Filled 2015-03-08: qty 30

## 2015-03-08 MED ORDER — LISINOPRIL 10 MG PO TABS
10.0000 mg | ORAL_TABLET | Freq: Every day | ORAL | Status: DC
  Administered 2015-03-10: 10 mg via ORAL
  Filled 2015-03-08 (×2): qty 1

## 2015-03-08 MED ORDER — GLYCOPYRROLATE 0.2 MG/ML IJ SOLN
INTRAMUSCULAR | Status: DC | PRN
  Administered 2015-03-08: 0.2 mg via INTRAVENOUS
  Administered 2015-03-08: 0.4 mg via INTRAVENOUS
  Administered 2015-03-08: 0.2 mg via INTRAVENOUS

## 2015-03-08 MED ORDER — DIPHENHYDRAMINE HCL 25 MG PO CAPS: 25.0000 mg | ORAL_CAPSULE | Freq: Four times a day (QID) | ORAL | Status: DC | PRN

## 2015-03-08 MED ORDER — LACTATED RINGERS IV SOLN
INTRAVENOUS | Status: DC | PRN
  Administered 2015-03-08 (×2): via INTRAVENOUS

## 2015-03-08 MED ORDER — DEXAMETHASONE SODIUM PHOSPHATE 10 MG/ML IJ SOLN
INTRAMUSCULAR | Status: DC | PRN
  Administered 2015-03-08: 10 mg via INTRAVENOUS

## 2015-03-08 MED ORDER — CITALOPRAM HYDROBROMIDE 10 MG PO TABS
20.0000 mg | ORAL_TABLET | Freq: Every day | ORAL | Status: DC
  Administered 2015-03-08 – 2015-03-10 (×3): 20 mg via ORAL
  Filled 2015-03-08 (×3): qty 2

## 2015-03-08 MED ORDER — ONDANSETRON HCL 4 MG/2ML IJ SOLN
INTRAMUSCULAR | Status: DC | PRN
  Administered 2015-03-08: 4 mg via INTRAVENOUS

## 2015-03-08 MED ORDER — SURGIFOAM 100 EX MISC
CUTANEOUS | Status: DC | PRN
  Administered 2015-03-08: 20 mL via TOPICAL

## 2015-03-08 MED ORDER — CLONAZEPAM 1 MG PO TABS
1.0000 mg | ORAL_TABLET | Freq: Every day | ORAL | Status: DC
  Administered 2015-03-08 – 2015-03-09 (×2): 1 mg via ORAL
  Filled 2015-03-08 (×2): qty 1

## 2015-03-08 MED ORDER — SODIUM CHLORIDE 0.9 % IJ SOLN: 3.0000 mL | Freq: Two times a day (BID) | INTRAMUSCULAR | Status: DC

## 2015-03-08 MED ORDER — KETOROLAC TROMETHAMINE 15 MG/ML IJ SOLN
15.0000 mg | Freq: Four times a day (QID) | INTRAMUSCULAR | Status: AC
  Administered 2015-03-08 – 2015-03-09 (×5): 15 mg via INTRAVENOUS
  Filled 2015-03-08 (×5): qty 1

## 2015-03-08 MED ORDER — FLORA-Q PO CAPS
1.0000 | ORAL_CAPSULE | Freq: Every day | ORAL | Status: DC
  Administered 2015-03-08 – 2015-03-10 (×3): 1 via ORAL
  Filled 2015-03-08 (×3): qty 1

## 2015-03-08 MED ORDER — DOCUSATE SODIUM 100 MG PO CAPS
100.0000 mg | ORAL_CAPSULE | Freq: Two times a day (BID) | ORAL | Status: DC
  Administered 2015-03-08 – 2015-03-10 (×5): 100 mg via ORAL
  Filled 2015-03-08 (×5): qty 1

## 2015-03-08 MED ORDER — EPHEDRINE SULFATE 50 MG/ML IJ SOLN
INTRAMUSCULAR | Status: DC | PRN
  Administered 2015-03-08 (×2): 5 mg via INTRAVENOUS

## 2015-03-08 MED ORDER — CEFAZOLIN SODIUM 1-5 GM-% IV SOLN
1.0000 g | Freq: Three times a day (TID) | INTRAVENOUS | Status: AC
  Administered 2015-03-08 (×2): 1 g via INTRAVENOUS
  Filled 2015-03-08 (×2): qty 50

## 2015-03-08 MED ORDER — SODIUM CHLORIDE 0.9 % IV SOLN: 250.0000 mL | INTRAVENOUS | Status: DC

## 2015-03-08 MED ORDER — DILTIAZEM HCL ER 240 MG PO CP24
240.0000 mg | ORAL_CAPSULE | Freq: Every day | ORAL | Status: DC
  Administered 2015-03-10: 240 mg via ORAL
  Filled 2015-03-08 (×5): qty 1

## 2015-03-08 MED ORDER — HYDROMORPHONE HCL 1 MG/ML IJ SOLN
0.5000 mg | INTRAMUSCULAR | Status: DC | PRN
  Administered 2015-03-08: 1 mg via INTRAVENOUS
  Filled 2015-03-08: qty 1

## 2015-03-08 MED ORDER — SCOPOLAMINE 1 MG/3DAYS TD PT72
MEDICATED_PATCH | TRANSDERMAL | Status: DC | PRN
  Administered 2015-03-08: 1 via TRANSDERMAL

## 2015-03-08 MED ORDER — MENTHOL 3 MG MT LOZG: 1.0000 | LOZENGE | OROMUCOSAL | Status: DC | PRN

## 2015-03-08 MED ORDER — FENTANYL CITRATE (PF) 100 MCG/2ML IJ SOLN
INTRAMUSCULAR | Status: AC
  Filled 2015-03-08: qty 2

## 2015-03-08 MED ORDER — ONDANSETRON HCL 4 MG/2ML IJ SOLN
4.0000 mg | INTRAMUSCULAR | Status: DC | PRN
  Filled 2015-03-08: qty 2

## 2015-03-08 MED ORDER — DEXAMETHASONE SODIUM PHOSPHATE 10 MG/ML IJ SOLN
INTRAMUSCULAR | Status: AC
  Filled 2015-03-08: qty 1

## 2015-03-08 MED ORDER — MEPERIDINE HCL 25 MG/ML IJ SOLN: 6.2500 mg | INTRAMUSCULAR | Status: DC | PRN

## 2015-03-08 MED ORDER — BISACODYL 5 MG PO TBEC
10.0000 mg | DELAYED_RELEASE_TABLET | ORAL | Status: DC
  Filled 2015-03-08: qty 2

## 2015-03-08 MED ORDER — PROPOFOL 10 MG/ML IV BOLUS
INTRAVENOUS | Status: DC | PRN
  Administered 2015-03-08: 150 mg via INTRAVENOUS

## 2015-03-08 MED ORDER — STERILE WATER FOR INJECTION IJ SOLN
INTRAMUSCULAR | Status: AC
  Filled 2015-03-08: qty 20

## 2015-03-08 SURGICAL SUPPLY — 55 items
BAG DECANTER FOR FLEXI CONT (MISCELLANEOUS) ×2 IMPLANT
BLADE CLIPPER SURG (BLADE) IMPLANT
BONE MATRIX OSTEOCEL PRO MED (Bone Implant) ×2 IMPLANT
BUR MATCHSTICK NEURO 3.0 LAGG (BURR) ×2 IMPLANT
CAGE COROENT MP 8X23 (Cage) ×4 IMPLANT
CANISTER SUCT 3000ML PPV (MISCELLANEOUS) ×2 IMPLANT
CONT SPEC 4OZ CLIKSEAL STRL BL (MISCELLANEOUS) ×4 IMPLANT
COVER BACK TABLE 60X90IN (DRAPES) ×2 IMPLANT
DECANTER SPIKE VIAL GLASS SM (MISCELLANEOUS) ×2 IMPLANT
DERMABOND ADVANCED (GAUZE/BANDAGES/DRESSINGS) ×1
DERMABOND ADVANCED .7 DNX12 (GAUZE/BANDAGES/DRESSINGS) ×1 IMPLANT
DRAPE C-ARM 42X72 X-RAY (DRAPES) ×4 IMPLANT
DRAPE LAPAROTOMY 100X72X124 (DRAPES) ×2 IMPLANT
DRAPE POUCH INSTRU U-SHP 10X18 (DRAPES) ×2 IMPLANT
DRAPE PROXIMA HALF (DRAPES) ×2 IMPLANT
DRSG OPSITE POSTOP 4X6 (GAUZE/BANDAGES/DRESSINGS) ×2 IMPLANT
DURAPREP 26ML APPLICATOR (WOUND CARE) ×2 IMPLANT
ELECT REM PT RETURN 9FT ADLT (ELECTROSURGICAL) ×2
ELECTRODE REM PT RTRN 9FT ADLT (ELECTROSURGICAL) ×1 IMPLANT
GAUZE SPONGE 4X4 12PLY STRL (GAUZE/BANDAGES/DRESSINGS) IMPLANT
GAUZE SPONGE 4X4 16PLY XRAY LF (GAUZE/BANDAGES/DRESSINGS) IMPLANT
GLOVE BIOGEL PI IND STRL 8.5 (GLOVE) ×2 IMPLANT
GLOVE BIOGEL PI INDICATOR 8.5 (GLOVE) ×2
GLOVE ECLIPSE 8.5 STRL (GLOVE) ×4 IMPLANT
GOWN STRL REUS W/ TWL LRG LVL3 (GOWN DISPOSABLE) ×1 IMPLANT
GOWN STRL REUS W/ TWL XL LVL3 (GOWN DISPOSABLE) ×1 IMPLANT
GOWN STRL REUS W/TWL 2XL LVL3 (GOWN DISPOSABLE) ×4 IMPLANT
GOWN STRL REUS W/TWL LRG LVL3 (GOWN DISPOSABLE) ×1
GOWN STRL REUS W/TWL XL LVL3 (GOWN DISPOSABLE) ×1
HEMOSTAT POWDER KIT SURGIFOAM (HEMOSTASIS) ×2 IMPLANT
KIT BASIN OR (CUSTOM PROCEDURE TRAY) ×2 IMPLANT
KIT ROOM TURNOVER OR (KITS) ×2 IMPLANT
MILL MEDIUM DISP (BLADE) ×2 IMPLANT
NEEDLE HYPO 22GX1.5 SAFETY (NEEDLE) ×2 IMPLANT
NS IRRIG 1000ML POUR BTL (IV SOLUTION) ×2 IMPLANT
PACK LAMINECTOMY NEURO (CUSTOM PROCEDURE TRAY) ×2 IMPLANT
PAD ARMBOARD 7.5X6 YLW CONV (MISCELLANEOUS) ×6 IMPLANT
PATTIES SURGICAL .5 X1 (DISPOSABLE) ×2 IMPLANT
ROD RELINE 0-0 CON M 5.0/6.0MM (Rod) ×4 IMPLANT
ROD RELINE-O LORD 5.5X40 (Rod) ×4 IMPLANT
SCREW LOCK RELINE 5.5 TULIP (Screw) ×12 IMPLANT
SCREW RELINE-O POLY 6.5X40 (Screw) ×4 IMPLANT
SPONGE LAP 4X18 X RAY DECT (DISPOSABLE) IMPLANT
SPONGE SURGIFOAM ABS GEL 100 (HEMOSTASIS) ×2 IMPLANT
SUT VIC AB 1 CT1 18XBRD ANBCTR (SUTURE) ×1 IMPLANT
SUT VIC AB 1 CT1 8-18 (SUTURE) ×1
SUT VIC AB 2-0 CP2 18 (SUTURE) ×2 IMPLANT
SUT VIC AB 3-0 SH 8-18 (SUTURE) ×4 IMPLANT
SYR 20ML ECCENTRIC (SYRINGE) ×2 IMPLANT
SYR 3ML LL SCALE MARK (SYRINGE) ×8 IMPLANT
TOWEL OR 17X24 6PK STRL BLUE (TOWEL DISPOSABLE) ×2 IMPLANT
TOWEL OR 17X26 10 PK STRL BLUE (TOWEL DISPOSABLE) ×2 IMPLANT
TRAP SPECIMEN MUCOUS 40CC (MISCELLANEOUS) ×2 IMPLANT
TRAY FOLEY W/METER SILVER 14FR (SET/KITS/TRAYS/PACK) ×2 IMPLANT
WATER STERILE IRR 1000ML POUR (IV SOLUTION) ×2 IMPLANT

## 2015-03-08 NOTE — Progress Notes (Signed)
Patient ID: [name], female   DOB: 1934-03-14, 81 y.o.   MRN: [id number] Vital signs are stable Motor function is intact Dressing is clean and dry Some left leg pain

## 2015-03-08 NOTE — Transfer of Care (Signed)
Immediate Anesthesia Transfer of Care Note  Patient: [name]  Procedure(s) Performed: Procedure(s): Lumbar five-sacral one Posterior lumbar interbody fusion (N/A)  Patient Location: PACU  Anesthesia Type:General  Level of Consciousness: awake, alert , oriented and sedated  Airway & Oxygen Therapy: Patient Spontanous Breathing and Patient connected to nasal cannula oxygen  Post-op Assessment: Report given to RN, Post -op Vital signs reviewed and stable and Patient moving all extremities  Post vital signs: Reviewed and stable  Last Vitals:  Filed Vitals:   03/08/15 1138  BP: 135/54  Pulse: 79  Temp: 36.9 C  Resp: 19    Complications: No apparent anesthesia complications

## 2015-03-08 NOTE — Op Note (Signed)
Date of surgery: 03/08/2015 Preoperative diagnosis: Recurrent herniated nucleus pulposus L5-S1 status post arthrodesis L1-L5, left lumbar radiculopathy, advanced spondylosis L5-S1 Postoperative diagnosis: Recurrent herniated nucleus pulposus L5-S1 status post arthrodesis L5 1 to L5, left lumbar radiculopathy, advanced spondylosis L5-S1. Procedure: Bilateral laminotomies and decompression of the L5 and S1 nerve roots with more work than required for simple posterior interbody arthrodesis. Posterior lumbar interbody arthrodesis using peek spacers local autograft and allograft L5-S1. Extension of arthrodesis from L1-L5 to include S1 using pedicle screws and rods for segmental arthrodesis. Posterior lateral arthrodesis using local autograft and allograft L5-S1.  Surgeon: Kristeen Miss M.D. First Asst.: Albertina Parr M.D. Anesthesia: Gen. endotracheal Indications: [name] is a 81 year old individual is had a previous decompression fusion from L1-L5 for degenerative progressive scoliosis. She had a herniated nucleus pulposus at L5 and S1 on the left side and has had a left lumbar radiculopathy. She is now undergoing a microdiscectomy in addition to posterior lumbar interbody arthrodesis with extension of the fusion to S1.  Procedure: Patient was brought to the operating supine on a stretcher. After the smooth induction of general endotracheal anesthesia, she was turned prone. The back was prepped with alcohol DuraPrep and draped in a sterile fashion. A midline incision was created and carried down to the lumbar dorsal fascia. This was opened on either side of midline and on the left side the previous area of the surgery was examined. The L5 screw and rod adjacent to it was uncovered area a laminotomy and foraminotomy was then created on the left side and careful decompression of the common dural tube the S1 nerve root inferiorly and the L5 nerve root superiorly was undertaken. Ultimately a fragment of  disc was identified under the common dural tube at the takeoff of the S1 nerve root. The disc space was noted to be very tight however there is no solid arthrodesis at the space, the disc space was then opened and using a box cutter the disc space was entered to the 8 mm size. This was done bilaterally and large laminotomy was created on the right side removing the inferior margin lamina of L5 out to and including the entirety of L5-S1. The disc space was similarly entered with a box cutter and then a complete discectomy was performed at the L5-S1 level area once the endplates were completely decorticated of any endplate cartilage then the interspace was sized and was felt that an 65mm 4 lordotic 23 mm long space would fit best into this interspace. Interspace was filled with a combination of autograft and allograft which included osseous cell as a bone extender. The interspace was filled with 60 mL of this material.  Once the grafting was completed then pedicle entry sites were chosen and S1. 6.5 x 40 mm screws were placed through the pedicles and S1 under fluoroscopic guidance. Is no evidence of cutout knee S1 nerve roots were identified visually once the screws were placed. The previously identified rods just above the L5 screws were cleared and here side connector was placed and attached normally to the rod. Then a precontoured 40 mm rod was used to connect the side connector to the S1 pedicle screws. This was constructed in a neutral fashion. The system was torqued down to final tension in final radiographs were obtained identified good alignment of L5 on S1. A modest degree of lordosis was also encountered in this fixation. With this care was taken to inspect the L5 and S1 nerve roots lateral gutters which  had been previously decorticated were filled with graft. Thousand total of 70 mL of graft. Then the retractor was removed and the lumbar dorsal fascia was closed with #1 Vicryls, 20 Vicryls using the  substance tissues, 3 likely subcuticularly. Blood loss for the procedure was estimated at approximately 200 mL.

## 2015-03-08 NOTE — Clinical Social Work Note (Signed)
CSW Consult Acknowledged:   CSW received a consult for SNF placement. CSW awaiting PT/OT evaluation to determine the appropriate level of care.      Gale Klar, MSW, LCSWA [phone]  

## 2015-03-08 NOTE — Evaluation (Addendum)
Occupational Therapy Evaluation Patient Details Name: [name] MRN: [id number] DOB: 1934/05/24 Today's Date: 03/08/2015    History of Present Illness 81 y.o. with PMH of previous back surgery who is now s/p Lumbar five-sacral one Posterior lumbar interbody fusion.   Clinical Impression   Pt s/p above. Pt independent with ADLs, PTA. Feel pt will benefit from acute OT to increase independence and reinforce precautions prior to d/c.    Follow Up Recommendations  No OT follow up;Supervision - Intermittent    Equipment Recommendations  None recommended by OT    Recommendations for Other Services       Precautions / Restrictions Precautions Precautions: Back;Fall Precaution Booklet Issued: No Precaution Comments: Pt able to state 3/3 back precautions Required Braces or Orthoses: Spinal Brace Spinal Brace: Lumbar corset (already on in session; doffed in sitting) Restrictions Weight Bearing Restrictions: No      Mobility Bed Mobility Overal bed mobility: Needs Assistance Bed Mobility: Sit to Sidelying     Sit to sidelying: Supervision General bed mobility comments: cues for technique.  Transfers Overall transfer level: Needs assistance Transfers: Sit to/from Stand Sit to Stand: Supervision         General transfer comment: cue for hand placement.    Balance No LOB in session. Used RW for short distance ambulation from chair to bed.                         ADL Overall ADL's : Needs assistance/impaired                 Upper Body Dressing : Set up;Supervision/safety;Sitting   Lower Body Dressing: Sit to/from stand; Min guard    Toilet Transfer: Min guard;Ambulation;RW (sit to stand from chair; supervision for sit to stand transfer)           Functional mobility during ADLs: Min guard;Rolling walker General ADL Comments: Discussed incorporating precautions into functional activities such as use of cup for oral care, placement of grooming  items, UB dressing. Educated on LB ADL technique and pt able to cross legs over knees today. Explained sometimes that varies day to day and talked about AE. Pt reports spouse can assist her with LB dressing. Educated on back brace (clothing underneath, no sleeping in it). Educated on what pt could use for toilet aide if hygiene is an issue and suggested using wipes.     Vision     Perception     Praxis      Pertinent Vitals/Pain Pain Assessment: 0-10 Pain Score: 3  Pain Location: back and left leg Pain Descriptors / Indicators: Aching Pain Intervention(s): Monitored during session;Repositioned     Hand Dominance Left   Extremity/Trunk Assessment Upper Extremity Assessment Upper Extremity Assessment: Overall WFL for tasks assessed   Lower Extremity Assessment Lower Extremity Assessment: Defer to PT evaluation       Communication Communication Communication: No difficulties   Cognition Arousal/Alertness: Awake/alert Behavior During Therapy: WFL for tasks assessed/performed Overall Cognitive Status: Within Functional Limits for tasks assessed                     General Comments       Exercises       Shoulder Instructions      Home Living Family/patient expects to be discharged to:: Private residence Living Arrangements: Spouse/significant other Available Help at Discharge: Family;Available 24 hours/day Type of Home: House Home Access: Stairs to enter [employer] of Steps:  4 Entrance Stairs-Rails: Right;Left Home Layout: Two level;Able to live on main level with bedroom/bathroom     Bathroom Shower/Tub: Tub/shower unit;Curtain   Bathroom Toilet: Standard     Home Equipment: Cane - single point;Walker - 4 wheels;Shower seat;Grab bars - toilet   Additional Comments: Can borrow RW.      Prior Functioning/Environment Level of Independence: Independent with assistive device(s)        Comments: Using SPC PTA.    OT Diagnosis: Acute  pain   OT Problem List: Decreased knowledge of precautions;Decreased knowledge of use of DME or AE;Pain;Decreased activity tolerance   OT Treatment/Interventions: Self-care/ADL training;DME and/or AE instruction;Therapeutic activities;Patient/family education;Balance training    OT Goals(Current goals can be found in the care plan section) Acute Rehab OT Goals Patient Stated Goal: not stated OT Goal Formulation: With patient Time For Goal Achievement: 03/15/15 Potential to Achieve Goals: Good ADL Goals Pt Will Perform Grooming: with set-up;standing Pt Will Perform Upper Body Dressing: with set-up;sitting Pt Will Perform Lower Body Dressing: with set-up;sit to/from stand;with caregiver independent in assisting Pt Will Transfer to Toilet: ambulating;with modified independence;grab bars;regular height toilet Pt Will Perform Toileting - Clothing Manipulation and hygiene: with modified independence;sit to/from stand Pt Will Perform Tub/Shower Transfer: Tub transfer;with supervision;ambulating;shower seat;rolling walker Additional ADL Goal #1: Pt will independently verbalize 3/3 back precautions and maintain during session.  OT Frequency: Min 2X/week   Barriers to D/C:            Co-evaluation              End of Session Equipment Utilized During Treatment: Gait belt;Rolling walker;Back brace Nurse Communication: Other (comment) (family asking about a fan)  Activity Tolerance: Patient tolerated treatment well Patient left: in bed;with call bell/phone within reach;with family/visitor present   Time: 6060-0459 OT Time Calculation (min): 17 min Charges:  OT General Charges $OT Visit: 1 Procedure OT Evaluation $Initial OT Evaluation Tier I: 1 Procedure G-CodesBenito Mccreedy OTR/L [id number] 03/08/2015, 4:44 PM

## 2015-03-08 NOTE — Anesthesia Procedure Notes (Signed)
Procedure Name: Intubation Date/Time: 03/08/2015 7:39 AM Performed by: Scheryl Darter Pre-anesthesia Checklist: Patient identified, Emergency Drugs available, Suction available, Patient being monitored and Timeout performed Patient Re-evaluated:Patient Re-evaluated prior to inductionOxygen Delivery Method: Circle system utilized Preoxygenation: Pre-oxygenation with 100% oxygen Intubation Type: IV induction Ventilation: Mask ventilation without difficulty Laryngoscope Size: Miller and 2 Grade View: Grade I Tube type: Oral Tube size: 7.5 mm Number of attempts: 1 Airway Equipment and Method: Stylet Placement Confirmation: ETT inserted through vocal cords under direct vision,  positive ETCO2 and breath sounds checked- equal and bilateral Secured at: 22 cm Tube secured with: Tape Dental Injury: Teeth and Oropharynx as per pre-operative assessment

## 2015-03-08 NOTE — Anesthesia Postprocedure Evaluation (Signed)
  Anesthesia Post-op Note  Patient: CHASYA ZENZ  Procedure(s) Performed: Procedure(s): Lumbar five-sacral one Posterior lumbar interbody fusion (N/A)  Patient Location: PACU  Anesthesia Type:General  Level of Consciousness: awake and alert   Airway and Oxygen Therapy: Patient Spontanous Breathing and Patient connected to nasal cannula oxygen  Post-op Pain: mild  Post-op Assessment: Post-op Vital signs reviewed, Patient's Cardiovascular Status Stable, Respiratory Function Stable, Patent Airway and No signs of Nausea or vomiting LLE Motor Response: Purposeful movement LLE Sensation: Full sensation RLE Motor Response: Purposeful movement RLE Sensation: Full sensation      Post-op Vital Signs: Reviewed and stable  Last Vitals:  Filed Vitals:   03/08/15 1138  BP: 135/54  Pulse: 79  Temp: 36.9 C  Resp: 19    Complications: No apparent anesthesia complications

## 2015-03-08 NOTE — H&P (Signed)
[name] is an 81 y.o. female.   Chief Complaint: Back and left leg pain HPI: Patient is a 81 yo lady who has had previous scoliosis surgery from L1 to L5. She subsequently had an hnp at L5 S1 and has had surgery for it. She recovered well but now has recurrent pain with evidence of a large recurrence in addition to significant degenerative changes in the disc space. She has been advised that a decompression and fusion should be performed.  Past Medical History  Diagnosis Date  . Atrial enlargement, left   . Mitral regurgitation   . Mitral valve prolapse   . Palpitations   . Arthritis   . Depression   . GERD (gastroesophageal reflux disease)   . Allergy   . Hyperlipidemia   . Hypertension   . 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 PONV (postoperative nausea and vomiting)   . H/O hiatal hernia   . Heart murmur     06/10/2014 seeing new cardiologist  . Heart murmur   . Diabetes mellitus without complication     fasting cbg 110s  . Anxiety   . Headache(784.0)   . Family history of adverse reaction to anesthesia     doesn't know     Past Surgical History  Procedure Laterality Date  . Anterior posterior and enterocele repairs  09/21/2004    With uterosacral cardinal colposuspension, partial colpocleisis; Lori Orion, MD  . Breast enhancement surgery  02/25/2002    Bilateral reduction and excision of accessory breast tissue underneath the left breast; [name]., MD  . Tonsillectomy and adenoidectomy    . Abdominal hysterectomy    . Cataract extraction Bilateral   . Breast surgery  11    reduction  . Rotator cuff repair Bilateral     11.12  . Back surgery    . Lumbar laminectomy/ decompression with met-rx N/A 03/16/2014    Procedure: LUMBAR FIVE-SACRAL ONE EXTRAFORAMINAL DISKECTOMY WITH METREX;  Surgeon: Lori Miss, MD;  Location: [facility] [facility];  Service: Neurosurgery;  Laterality: N/A;  . Lumbar laminectomy/decompression microdiscectomy Left 06/08/2014    Procedure: Left  Lumbar Five-Sacral One Microdiskectomy;  Surgeon: Lori Miss, MD;  Location: [place] [facility];  Service: Neurosurgery;  Laterality: Left;  Left L5-S1 Microdiskectomy  . Eye surgery      Family History  Problem Relation Age of Onset  . Heart failure Father   . Arthritis Father   . Hyperlipidemia Father   . Heart disease Father   . Arthritis Mother   . Hyperlipidemia Mother   . Hypertension Mother    Social History:  reports that she has never smoked. She has never used smokeless tobacco. She reports that she does not drink alcohol or use illicit drugs.  Allergies:  Allergies  Allergen Reactions  . Codeine     Facial swelling  . Cortisone     Insomnia, heart palpitations (po only)  . Sulfonamide Derivatives     As a child    No prescriptions prior to admission    No results found for this or any previous visit (from the past 49 hour(s)). No results found.  Review of Systems  HENT: Negative.   Eyes: Negative.   Respiratory: Negative.   Cardiovascular: Negative.   Gastrointestinal: Negative.   Genitourinary: Negative.   Musculoskeletal: Positive for back pain.  Skin: Negative.   Neurological: Positive for tingling and weakness.       Left leg pain, buttock pain  Psychiatric/Behavioral: Negative.  There were no vitals taken for this visit. Physical Exam  Constitutional: She is oriented to person, place, and time. She appears well-developed.  Frail appearing  HENT:  Head: Normocephalic and atraumatic.  Eyes: Conjunctivae are normal. Pupils are equal, round, and reactive to light.  Neck: Normal range of motion. Neck supple.  Cardiovascular: Normal rate and regular rhythm.   Respiratory: Effort normal and breath sounds normal.  GI: Soft. Bowel sounds are normal.  Musculoskeletal:  Positive SLR on left at 30 degrees  Neurological: She is alert and oriented to person, place, and time.  Absent achilles reflex bilaterally ... Mild weakness of left gastroc  Skin:  Skin is warm and dry.  Psychiatric: She has a normal mood and affect. Her behavior is normal. Judgment and thought content normal.     Assessment/Plan Recurrent HNP at L5 S1 left with radiculopathy. Patient has previous fusion of L1 to L5.   Decompresion and fusion of L5 to S1.  [name] J 03/08/2015, 1:21 AM

## 2015-03-08 NOTE — Evaluation (Signed)
Physical Therapy Evaluation Patient Details Name: [name] MRN: [id number] DOB: 1934-02-20 Today's Date: 03/08/2015   History of Present Illness  81 y.o. with PMH of previous back surgery who is now s/p Lumbar five-sacral one Posterior lumbar interbody fusion.  Clinical Impression  Patient presents with pain and balance deficits s/p above surgery impacting mobility. Pt will have 24/7 S at home at d/c. Pt with impaired safety awareness at times. Tolerated short distance ambulation with Min A for balance. Education provided on back precautions and positioning. Would benefit from HHPT to maximize independence and mobility. Will continue to follow while in hospital. Plan for stair training tomorrow if tolerated.      Follow Up Recommendations Home health PT;Supervision/Assistance - 24 hour    Equipment Recommendations  None recommended by PT    Recommendations for Other Services       Precautions / Restrictions Precautions Precautions: Back;Fall Precaution Booklet Issued: No Precaution Comments: Reviewed 3/3 back precautions.  Required Braces or Orthoses: Spinal Brace Spinal Brace: Lumbar corset Restrictions Weight Bearing Restrictions: No      Mobility  Bed Mobility Overal bed mobility: Needs Assistance Bed Mobility: Rolling;Sidelying to Sit Rolling: Supervision Sidelying to sit: Supervision;HOB elevated       General bed mobility comments: Cues for log roll technique. Use of rails for support.   Transfers Overall transfer level: Needs assistance Equipment used: Rolling walker (2 wheeled) Transfers: Sit to/from Stand Sit to Stand: Min guard         General transfer comment: Cues for hand placement and technique in order to adhere to back precautions.   Ambulation/Gait Ambulation/Gait assistance: Min assist Ambulation Distance (Feet): 75 Feet Assistive device: Rolling walker (2 wheeled) Gait Pattern/deviations: Step-through pattern;Decreased stride length    Gait velocity interpretation: Below normal speed for age/gender General Gait Details: Cues for upright posture and RW management. Impaired safety awareness as pt taking hands off RW during gait- unsteady.  Stairs            Wheelchair Mobility    Modified Rankin (Stroke Patients Only)       Balance Overall balance assessment: Needs assistance Sitting-balance support: Feet supported;No upper extremity supported Sitting balance-Leahy Scale: Good     Standing balance support: During functional activity Standing balance-Leahy Scale: Fair                               Pertinent Vitals/Pain Pain Assessment: 0-10 Pain Score: 4  Pain Location: back and LLE Pain Descriptors / Indicators: Sore;Aching Pain Intervention(s): Monitored during session;Repositioned    Home Living Family/patient expects to be discharged to:: Private residence Living Arrangements: Spouse/significant other Available Help at Discharge: Family;Available 24 hours/day Type of Home: House Home Access: Stairs to enter Entrance Stairs-Rails: Psychiatric nurse of Steps: 4 Home Layout: Two level;Able to live on main level with bedroom/bathroom Home Equipment: Kasandra Knudsen - single point;Walker - 4 wheels Additional Comments: Can borrow at [place].    Prior Function Level of Independence: Independent with assistive device(s)         Comments: Using [facility] PTA.     Hand Dominance   Dominant Hand: Left    Extremity/Trunk Assessment   Upper Extremity Assessment: Defer to OT evaluation           Lower Extremity Assessment: Overall WFL for tasks assessed         Communication   Communication: No difficulties  Cognition Arousal/Alertness: Awake/alert Behavior During  Therapy: WFL for tasks assessed/performed Overall Cognitive Status: Within Functional Limits for tasks assessed (Questionable safety awareness as pt continually taking hands off RW and trying to fix room breaking  back precautions.)                      General Comments General comments (skin integrity, edema, etc.): Pt's daughter in law inr oom during session.    Exercises        Assessment/Plan    PT Assessment Patient needs continued PT services  PT Diagnosis Acute pain;Difficulty walking   PT Problem List Pain;Decreased activity tolerance;Decreased safety awareness;Decreased balance;Decreased mobility;Decreased knowledge of precautions;Decreased knowledge of use of DME  PT Treatment Interventions Balance training;Gait training;Functional mobility training;Therapeutic activities;Therapeutic exercise;Patient/family education;DME instruction;Stair training   PT Goals (Current goals can be found in the Care Plan section) Acute Rehab PT Goals Patient Stated Goal: to return home PT Goal Formulation: With patient Time For Goal Achievement: 03/22/15 Potential to Achieve Goals: Good    Frequency Min 5X/week   Barriers to discharge        Co-evaluation               End of Session Equipment Utilized During Treatment: Back brace Activity Tolerance: Patient tolerated treatment well Patient left: in chair;with call bell/phone within reach;with family/visitor present;Other (comment) (OT in room upon PT departure.) Nurse Communication: Mobility status         Time: 1545-1610 PT Time Calculation (min) (ACUTE ONLY): 25 min   Charges:   PT Evaluation $Initial PT Evaluation Tier I: 1 Procedure PT Treatments $Self Care/Home Management: 8-22   PT G Codes:        Carson Bogden A Shalina Norfolk 03/08/2015, 4:19 PM Wray Kearns, Antoine, DPT [phone]

## 2015-03-09 NOTE — Progress Notes (Signed)
Occupational Therapy Treatment Patient Details Name: [name] MRN: [id number] DOB: March 13, 1934 Today's Date: 03/09/2015    History of present illness 81 y.o. with PMH of previous back surgery who is now s/p Lumbar five-sacral one Posterior lumbar interbody fusion.   OT comments  Pt very anxious with don of gown being tied and verbalized feeling weak / unsteady. Pt completed tub transfer this session min guard. Pt will need (A) from spouse upon d/c to maintain precautions.   Follow Up Recommendations  No OT follow up;Supervision - Intermittent    Equipment Recommendations  None recommended by OT    Recommendations for Other Services      Precautions / Restrictions Precautions Precautions: Back;Fall Required Braces or Orthoses: Spinal Brace Spinal Brace: Lumbar corset       Mobility Bed Mobility Overal bed mobility: Needs Assistance Bed Mobility: Sit to Supine       Sit to supine: Min guard   General bed mobility comments: cues for safety   Transfers Overall transfer level: Needs assistance Equipment used: Rolling walker (2 wheeled) Transfers: Sit to/from Stand Sit to Stand: Min guard         General transfer comment: cues for hand placement.    Balance                                   ADL Overall ADL's : Needs assistance/impaired                                 Tub/ Shower Transfer: Nurse, learning disability Details (indicate cue type and reason): transfered using shower seat   General ADL Comments: pt demonstrates confusion with navigation to return to the room. pt verbalized feeling unsteady.       Vision                     Perception     Praxis      Cognition   Behavior During Therapy: [facility] for tasks assessed/performed Overall Cognitive Status: Impaired/Different from baseline Area of Impairment: Memory     Memory: Decreased short-term memory          General Comments: Pt unable to  navigate from room to gym and back . pt states"I am disoriented how do I get back" Pt informed room number is 4n26. Pt attempting to enter room 31. Pt HOH but needed additional cues and verbalized back statement that therapist stated so pt heard correctly the command.    Extremity/Trunk Assessment               Exercises     Shoulder Instructions       General Comments      Pertinent Vitals/ Pain       Pain Assessment: 0-10 Pain Score: 4  Pain Location: back Pain Intervention(s): Monitored during session;Premedicated before session  Home Living                                          Prior Functioning/Environment              Frequency Min 2X/week     Progress Toward Goals  OT Goals(current goals can now be found in the care plan section)  Progress towards OT  goals: Progressing toward goals  Acute Rehab OT Goals Patient Stated Goal: not stated OT Goal Formulation: With patient Time For Goal Achievement: 03/15/15 Potential to Achieve Goals: Good ADL Goals Pt Will Perform Grooming: with set-up;standing Pt Will Perform Upper Body Dressing: with set-up;sitting Pt Will Perform Lower Body Dressing: with set-up;sit to/from stand;with caregiver independent in assisting Pt Will Transfer to Toilet: ambulating;with modified independence;grab bars;regular height toilet Pt Will Perform Toileting - Clothing Manipulation and hygiene: with modified independence;sit to/from stand Pt Will Perform Tub/Shower Transfer: Tub transfer;with supervision;ambulating;shower seat;rolling walker Additional ADL Goal #1: Pt will independently verbalize 3/3 back precautions and maintain during session.  Plan Discharge plan remains appropriate    Co-evaluation                 End of Session Equipment Utilized During Treatment: Gait belt;Rolling walker;Back brace   Activity Tolerance Patient tolerated treatment well   Patient Left in bed;with call bell/phone  within reach;with bed alarm set   Nurse Communication Mobility status;Precautions        Time: 2111-5520 OT Time Calculation (min): 12 min  Charges: OT General Charges $OT Visit: 1 Procedure OT Treatments $Self Care/Home Management : 8-22 mins  Parke Poisson B 03/09/2015, 9:51 AM Pager: [phone]

## 2015-03-09 NOTE — Progress Notes (Signed)
Physical Therapy Treatment Patient Details Name: [name] MRN: [id number] DOB: Jan 15, 1934 Today's Date: 03/09/2015    History of Present Illness 81 y.o. with PMH of previous back surgery who is now s/p Lumbar five-sacral one Posterior lumbar interbody fusion.    PT Comments    Progressing well towards PT goals. Steady with rolling walker for support. Intermittent cues for safety throughout therapy session. Safely completed stair and car transfer training. Husband and daughter observed throughout therapy session. Will continue to progress functional independence until d/c.  Follow Up Recommendations  Home health PT;Supervision/Assistance - 24 hour     Equipment Recommendations  None recommended by PT    Recommendations for Other Services       Precautions / Restrictions Precautions Precautions: Back;Fall Precaution Booklet Issued: Yes (comment) Precaution Comments: Reviewed Required Braces or Orthoses: Spinal Brace Spinal Brace: Lumbar corset Restrictions Weight Bearing Restrictions: No    Mobility  Bed Mobility Overal bed mobility: Needs Assistance Bed Mobility: Rolling;Sidelying to Sit Rolling: Supervision Sidelying to sit: Supervision       General bed mobility comments: Supervision for safety. Needed cues not to forward flex when PT entered room. VC for technique getting to edge of bed but no physical assist. Performed from bed and low table mat.  Transfers Overall transfer level: Needs assistance Equipment used: Rolling walker (2 wheeled) Transfers: Sit to/from Stand Sit to Stand: Supervision         General transfer comment: Supervision for safety. VC for hand placement and to maintain back precautions.  Ambulation/Gait Ambulation/Gait assistance: Supervision Ambulation Distance (Feet): 150 Feet Assistive device: Rolling walker (2 wheeled) Gait Pattern/deviations: Step-through pattern;Decreased stride length Gait velocity: decreased   General  Gait Details: VC for forward gaze intermittently. Demonstrates appropriate posture today. Cues to keep walker in front with hands on RW for support when approaching stairs or attempting to sit in recliner. No buckling of LEs or loss of balance noted during bout.   Stairs Stairs: Yes Stairs assistance: Supervision Stair Management: Two rails;Step to pattern;Forwards Number of Stairs: 2 (x2) General stair comments: Educated on safe stair navigation techniques with husband present and observing. Cues for sequencing. Performed safely with both hands on Rails for support. No buckling noted. Pt and family report they feel confident with this task.  Wheelchair Mobility    Modified Rankin (Stroke Patients Only)       Balance                                    Cognition Arousal/Alertness: Awake/alert Behavior During Therapy: WFL for tasks assessed/performed Overall Cognitive Status: Impaired/Different from baseline Area of Impairment: Memory     Memory: Decreased short-term memory         General Comments: Recalled room number. Cues to look for numbers on wall to find room.    Exercises      General Comments General comments (skin integrity, edema, etc.): Per daughter request. Practiced entering/exiting a car-like envioronment. Performed safely without breaking back precautions.      Pertinent Vitals/Pain Pain Assessment: 0-10 Pain Score:  ("Doing fine right now" no value given) Pain Location: back Pain Intervention(s): Monitored during session;Repositioned    Home Living                      Prior Function            PT Goals (current goals can  now be found in the care plan section) Acute Rehab PT Goals PT Goal Formulation: With patient Time For Goal Achievement: 03/22/15 Potential to Achieve Goals: Good Progress towards PT goals: Progressing toward goals    Frequency  Min 5X/week    PT Plan Current plan remains appropriate     Co-evaluation             End of Session Equipment Utilized During Treatment: Back brace Activity Tolerance: Patient tolerated treatment well Patient left: in chair;with call bell/phone within reach;with family/visitor present     Time: 6067-7034 PT Time Calculation (min) (ACUTE ONLY): 21 min  Charges:  $Gait Training: 8-22 mins                    G Codes:      Ellouise Newer 04-04-15, 12:23 PM Camille Bal [place], [place]

## 2015-03-09 NOTE — Progress Notes (Signed)
Patient ID: [name], female   DOB: Dec 07, 1933, 81 y.o.   MRN: [id number] Vital signs are stable Physical therapist notes the concern of confusion Patient does appear a bit tremulous this morning Mentation however appears clear to me She is reading the paper seated in bed She notes less left leg pain then before surgery Dressings dry Continue to mobilize

## 2015-03-10 MED ORDER — METHOCARBAMOL 500 MG PO TABS: 500.0000 mg | ORAL_TABLET | Freq: Four times a day (QID) | ORAL | Status: DC | PRN

## 2015-03-10 MED ORDER — HYDROCODONE-ACETAMINOPHEN 5-325 MG PO TABS: 1.0000 | ORAL_TABLET | ORAL | Status: DC | PRN

## 2015-03-10 MED FILL — Heparin Sodium (Porcine) Inj 1000 Unit/ML: INTRAMUSCULAR | Qty: 30 | Status: AC

## 2015-03-10 MED FILL — Sodium Chloride IV Soln 0.9%: INTRAVENOUS | Qty: 1000 | Status: AC

## 2015-03-10 NOTE — Discharge Summary (Signed)
Physician Discharge Summary  Patient ID: KHALILA BUECHNER MRN: [id number] DOB/AGE: [birth year] 81 y.o.  Admit date: 03/08/2015 Discharge date: 03/10/2015  Admission Diagnoses: Recurrent herniated nucleus pulposus L5-S1 left with left lumbar radiculopathy, status post arthrodesis L1-L5 for lumbar degenerative scoliosis  Discharge Diagnoses: Recurrent herniated nucleus pulposus L5-S1 left with left lumbar radiculopathy, status post arthrodesis L1 L5 for lumbar degenerative scoliosis Active Problems:   Lumbar spondylosis   Discharged Condition: good  Hospital Course: Patient was admitted to undergo surgical decompression and stabilization at the L5-S1 joint. She tolerated surgery well.  Consults: None  Significant Diagnostic Studies: None  Treatments: surgery: Decompression L5-S1 with posterior lumbar interbody arthrodesis L5-S1 pedicle screw fixation L5 to L1-L5 fixation posterior lateral arthrodesis L5-S1  Discharge Exam: Blood pressure 115/49, pulse 80, temperature 98.5 F (36.9 C), temperature source Oral, resp. rate 18, weight 58.514 kg (129 lb), SpO2 94 %. Incision is clean and dry, motor function is intact in lower extremities. Station and gait are intact.  Disposition: 01-Home or Self Care  Discharge Instructions    Call MD for:  redness, tenderness, or signs of infection (pain, swelling, redness, odor or green/yellow discharge around incision site)    Complete by:  As directed      Call MD for:  severe uncontrolled pain    Complete by:  As directed      Call MD for:  temperature >100.4    Complete by:  As directed      Diet - low sodium heart healthy    Complete by:  As directed      Discharge instructions    Complete by:  As directed   Okay to shower. Do not apply salves or appointments to incision. No heavy lifting with the upper extremities greater than 15 pounds. May resume driving when not requiring pain medication and patient feels comfortable with doing so.      Increase activity slowly    Complete by:  As directed             Medication List    TAKE these medications        bisacodyl 5 MG EC tablet  Commonly known as:  DULCOLAX  Take 10-15 mg by mouth once a week.     citalopram 20 MG tablet  Commonly known as:  CELEXA  Take 1 tablet by mouth   (20mg  total) daily     clonazePAM 1 MG tablet  Commonly known as:  KLONOPIN  TAKE 1 TABLET BY MOUTH EVERY NIGHT AT BEDTIME AS DIRECTED     diltiazem 240 MG 24 hr capsule  Commonly known as:  DILACOR XR  Take 1 capsule (240 mg total) by mouth daily.     fluticasone 50 MCG/ACT nasal spray  Commonly known as:  FLONASE  Place 2 sprays into both nostrils daily as needed for allergies.     gabapentin 100 MG capsule  Commonly known as:  NEURONTIN  TAKE ONE CAPSULE BY MOUTH THREE TIMES DAILY.     HYDROcodone-acetaminophen 5-325 MG per tablet  Commonly known as:  NORCO/VICODIN  Take 0.5-1 tablets by mouth every 6 (six) hours as needed for moderate pain. 0.5 tablet throughout the day and 1 tablet at bedtime     HYDROcodone-acetaminophen 5-325 MG per tablet  Commonly known as:  NORCO/VICODIN  Take 1-2 tablets by mouth every 4 (four) hours as needed (mild pain).     ibuprofen 200 MG tablet  Commonly known as:  ADVIL,MOTRIN  Take 400 mg  by mouth every 6 (six) hours as needed for mild pain.     lisinopril-hydrochlorothiazide 10-12.5 MG per tablet  Commonly known as:  PRINZIDE,ZESTORETIC  Take 1 tablet by mouth daily.     metFORMIN 500 MG tablet  Commonly known as:  GLUCOPHAGE  Take 2 tablets by mouth  every morning and 1 tablet  by mouth before supper     methocarbamol 500 MG tablet  Commonly known as:  ROBAXIN  Take 1 tablet (500 mg total) by mouth every 6 (six) hours as needed for muscle spasms.     MULTIVITAMIN & MINERAL PO  Take 1 tablet by mouth daily.     ONETOUCH DELICA LANCETS 70W Misc  TEST TWICE DAILY AS DIRECTED     ONETOUCH VERIO test strip  Generic drug:  glucose blood   CHECK BLOOD GLUCOSE TWICE DAILY AS DIRECTED     Probiotic Caps  Take 1 capsule by mouth daily.     SM CRANBERRY 300 MG tablet  Generic drug:  Cranberry  Take 300 mg by mouth every morning.     Vitamin D 1000 UNITS capsule  Take 1,000 Units by mouth daily.         SignedEarleen Newport 03/10/2015, 9:17 AM

## 2015-03-10 NOTE — Care Management (Signed)
Important Message  Patient Details  Name: [name] MRN: [id number] Date of Birth: [birth year]   Medicare Important Message Given:  Yes-second notification given    Delorse Lek 03/10/2015, 10:25 AM

## 2015-03-10 NOTE — Care Management Note (Signed)
Case Management Note  Patient Details  Name: [name] MRN: [id number] Date of Birth: 10/04/1933  Subjective/Objective:                    Action/Plan: Met with patient to discuss discharge needs. Per patient, she and Dr Elsner discussed needs and he did not intend to order anything. Patient states she has a rolling walker and tub bench at home already. No further needs at this time. Bedside RN updated.  Expected Discharge Date:                  Expected Discharge Plan:  Home/Self Care  In-House Referral:     Discharge planning Services  CM Consult  Post Acute Care Choice:    Choice offered to:     DME Arranged:    DME Agency:     HH Arranged:    HH Agency:     Status of Service:  Completed, signed off  Medicare Important Message Given:  Yes-second notification given Date Medicare IM Given:    Medicare IM give by:    Date Additional Medicare IM Given:    Additional Medicare Important Message give by:     If discussed at Long Length of Stay Meetings, dates discussed:    Additional Comments:  Robarge, Courtney C, RN 03/10/2015, 10:48 AM  

## 2015-03-10 NOTE — Progress Notes (Signed)
Pt discharging with family at this time taking all personal belongings. Brace on and aligned. Pt denies discomfort at this time. IV discontinued, dry dressing applied. Discharge instructions provided with prescriptions with verbal understanding. No noted distress. Pt assisted to car for discharge.

## 2015-03-15 ENCOUNTER — Telehealth: Payer: Self-pay | Admitting: Family Medicine

## 2015-03-15 NOTE — Telephone Encounter (Signed)
Yes

## 2015-03-15 NOTE — Telephone Encounter (Signed)
Pt has been scheduled.  °

## 2015-03-15 NOTE — Telephone Encounter (Signed)
Pt ear is clogged up and pt prefers to see dr Elease Hashimoto. Only SD appt avail. Is it ok to use sd?

## 2015-03-16 ENCOUNTER — Telehealth: Payer: Self-pay | Admitting: Family Medicine

## 2015-03-16 NOTE — Telephone Encounter (Signed)
Refill for 6 months. 

## 2015-03-16 NOTE — Telephone Encounter (Signed)
Pt request refill of the following: clonazePAM (KLONOPIN) 1 MG tablet   Phamacy: [facility] [address]

## 2015-03-16 NOTE — Telephone Encounter (Signed)
Last visit 12/31/14 Last refill 11/23/14 #30 3 refill

## 2015-03-17 ENCOUNTER — Encounter: Payer: Self-pay | Admitting: Family Medicine

## 2015-03-17 ENCOUNTER — Other Ambulatory Visit: Payer: Self-pay

## 2015-03-17 ENCOUNTER — Ambulatory Visit (INDEPENDENT_AMBULATORY_CARE_PROVIDER_SITE_OTHER): Payer: Medicare Other | Admitting: Family Medicine

## 2015-03-17 VITALS — BP 120/72 | HR 78 | Temp 97.7°F | Wt 129.0 lb

## 2015-03-17 DIAGNOSIS — H6121 Impacted cerumen, right ear: Secondary | ICD-10-CM

## 2015-03-17 DIAGNOSIS — G4700 Insomnia, unspecified: Secondary | ICD-10-CM

## 2015-03-17 DIAGNOSIS — F5104 Psychophysiologic insomnia: Secondary | ICD-10-CM

## 2015-03-17 MED ORDER — CLONAZEPAM 1 MG PO TABS: ORAL_TABLET | ORAL | Status: DC

## 2015-03-17 NOTE — Addendum Note (Signed)
Addended by: Marcina Millard on: 03/17/2015 03:50 PM   Modules accepted: Orders

## 2015-03-17 NOTE — Progress Notes (Addendum)
Subjective:    Patient ID: [name], female    DOB: 01-25-1934, 81 y.o.   MRN: [id number]  HPI Patient seen with right ear fullness for the past few days. She was concerned about possible cerumen impaction. She's not had any dizziness. No ear pain. No drainage. Minimal nasal congestion. No fevers or chills  She had lumbar back fusion 9 days ago and has done well since her surgery. She has chronic insomnia and requesting refill of clonazepam 0.5 mg daily at bedtime as needed. She's had very difficult time falling asleep. No alcohol or caffeine use  Past Medical History  Diagnosis Date  . Atrial enlargement, left   . Mitral regurgitation   . Mitral valve prolapse   . Palpitations   . Arthritis   . Depression   . GERD (gastroesophageal reflux disease)   . Allergy   . Hyperlipidemia   . Hypertension   . 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 PONV (postoperative nausea and vomiting)   . H/O hiatal hernia   . Heart murmur     06/10/2014 seeing new cardiologist  . Heart murmur   . Diabetes mellitus without complication     fasting cbg 110s  . Anxiety   . Headache(784.0)   . Family history of adverse reaction to anesthesia     doesn't know    Past Surgical History  Procedure Laterality Date  . Anterior posterior and enterocele repairs  09/21/2004    With uterosacral cardinal colposuspension, partial colpocleisis; Selinda Orion, MD  . Breast enhancement surgery  02/25/2002    Bilateral reduction and excision of accessory breast tissue underneath the left breast; Aretha Parrot., MD  . Tonsillectomy and adenoidectomy    . Abdominal hysterectomy    . Cataract extraction Bilateral   . Breast surgery  11    reduction  . Rotator cuff repair Bilateral     11.12  . Back surgery    . Lumbar laminectomy/ decompression with met-rx N/A 03/16/2014    Procedure: LUMBAR FIVE-SACRAL ONE EXTRAFORAMINAL DISKECTOMY WITH METREX;  Surgeon: Kristeen Miss, MD;  Location: [place] [facility];  Service:  Neurosurgery;  Laterality: N/A;  . Lumbar laminectomy/decompression microdiscectomy Left 06/08/2014    Procedure: Left Lumbar Five-Sacral One Microdiskectomy;  Surgeon: Kristeen Miss, MD;  Location: [place] [facility];  Service: Neurosurgery;  Laterality: Left;  Left L5-S1 Microdiskectomy  . Eye surgery      reports that she has never smoked. She has never used smokeless tobacco. She reports that she does not drink alcohol or use illicit drugs. family history includes Arthritis in her father and mother; Heart disease in her father; Heart failure in her father; Hyperlipidemia in her father and mother; Hypertension in her mother. Allergies  Allergen Reactions  . Codeine     Facial swelling  . Cortisone     Insomnia, heart palpitations (po only)  . Sulfonamide Derivatives     As a child      Review of Systems  Constitutional: Negative for fever and chills.  HENT: Positive for congestion.   Respiratory: Negative for cough and shortness of breath.   Cardiovascular: Negative for chest pain.       Objective:   Physical Exam  Constitutional: She appears well-developed and well-nourished.  HENT:  Left canal clear right canal moderate cerumen removed with curette without difficulty  Cardiovascular: Normal rate and regular rhythm.   Pulmonary/Chest: Effort normal and breath sounds normal. No respiratory distress. She has no wheezes. She  has no rales.  Musculoskeletal: She exhibits no edema.          Assessment & Plan:  #1 right ear fullness. Moderate cerumen removed with curette. Patient tolerated well. Eardrum appears normal Question component of eustachian tube dysfunction. Continue Flonase. No evidence for ear effusion #2 chronic insomnia. Sleep hygiene discussed. Refill clonazepam but try to use sparingly  Note amended on 03-24-15.  Previously stated "minimal cerumen" in ear canal on PE and this should have stated "moderate" as per assessment and plan.

## 2015-03-17 NOTE — Progress Notes (Signed)
Pre visit review using our clinic review tool, if applicable. No additional management support is needed unless otherwise documented below in the visit note. 

## 2015-03-17 NOTE — Telephone Encounter (Signed)
RX called in to the pharmacy.  

## 2015-03-30 DIAGNOSIS — M5126 Other intervertebral disc displacement, lumbar region: Secondary | ICD-10-CM | POA: Diagnosis not present

## 2015-04-28 ENCOUNTER — Ambulatory Visit
Admission: RE | Admit: 2015-04-28 | Discharge: 2015-04-28 | Disposition: A | Discharge status: Home / Self Care | Payer: Medicare Other | Source: Ambulatory Visit

## 2015-04-28 DIAGNOSIS — Z1231 Encounter for screening mammogram for malignant neoplasm of breast: Secondary | ICD-10-CM

## 2015-05-04 DIAGNOSIS — M5126 Other intervertebral disc displacement, lumbar region: Secondary | ICD-10-CM | POA: Diagnosis not present

## 2015-05-30 ENCOUNTER — Other Ambulatory Visit: Payer: Self-pay | Admitting: Family Medicine

## 2015-06-14 ENCOUNTER — Other Ambulatory Visit: Payer: Self-pay | Admitting: Family Medicine

## 2015-06-14 ENCOUNTER — Ambulatory Visit: Payer: Medicare Other | Admitting: Family Medicine

## 2015-06-20 ENCOUNTER — Encounter: Payer: Self-pay | Admitting: Family Medicine

## 2015-06-20 ENCOUNTER — Ambulatory Visit (INDEPENDENT_AMBULATORY_CARE_PROVIDER_SITE_OTHER): Payer: Medicare Other | Admitting: Family Medicine

## 2015-06-20 VITALS — BP 134/70 | HR 83 | Temp 97.7°F | Wt 132.5 lb

## 2015-06-20 DIAGNOSIS — R300 Dysuria: Secondary | ICD-10-CM

## 2015-06-20 DIAGNOSIS — Z23 Encounter for immunization: Secondary | ICD-10-CM | POA: Diagnosis not present

## 2015-06-20 LAB — POCT URINALYSIS DIPSTICK
Bilirubin, UA: NEGATIVE
Glucose, UA: NEGATIVE
KETONES UA: NEGATIVE
Nitrite, UA: POSITIVE
UROBILINOGEN UA: 0.2
pH, UA: 6

## 2015-06-20 MED ORDER — NITROFURANTOIN MONOHYD MACRO 100 MG PO CAPS: 100.0000 mg | ORAL_CAPSULE | Freq: Two times a day (BID) | ORAL | Status: DC

## 2015-06-20 NOTE — Addendum Note (Signed)
Addended by: Ailene Rud E on: 06/20/2015 09:11 AM   Modules accepted: Orders

## 2015-06-20 NOTE — Progress Notes (Signed)
Subjective:    Patient ID: [name], female    DOB: 12-02-1933, 81 y.o.   MRN: [id number]  HPI Acute visit. Patient developed symptoms last week of urine frequency and burning with urination. Over the weekend, she thinks she may of had some mild low-grade fever. Denies any back pain. No nausea or vomiting. General weakness. No abdominal pain. History of frequent UTI in the past. Allergy to sulfa.  Past Medical History  Diagnosis Date  . Atrial enlargement, left   . Mitral regurgitation   . Mitral valve prolapse   . Palpitations   . Arthritis   . Depression   . GERD (gastroesophageal reflux disease)   . Allergy   . Hyperlipidemia   . Hypertension   . 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 PONV (postoperative nausea and vomiting)   . H/O hiatal hernia   . Heart murmur     06/10/2014 seeing new cardiologist  . Heart murmur   . Diabetes mellitus without complication (HCC)     fasting cbg 110s  . Anxiety   . Headache(784.0)   . Family history of adverse reaction to anesthesia     doesn't know    Past Surgical History  Procedure Laterality Date  . Anterior posterior and enterocele repairs  09/21/2004    With uterosacral cardinal colposuspension, partial colpocleisis; Selinda Orion, MD  . Breast enhancement surgery  02/25/2002    Bilateral reduction and excision of accessory breast tissue underneath the left breast; Aretha Parrot., MD  . Tonsillectomy and adenoidectomy    . Abdominal hysterectomy    . Cataract extraction Bilateral   . Breast surgery  11    reduction  . Rotator cuff repair Bilateral     11.12  . Back surgery    . Lumbar laminectomy/ decompression with met-rx N/A 03/16/2014    Procedure: LUMBAR FIVE-SACRAL ONE EXTRAFORAMINAL DISKECTOMY WITH METREX;  Surgeon: Kristeen Miss, MD;  Location: [place] [facility];  Service: Neurosurgery;  Laterality: N/A;  . Lumbar laminectomy/decompression microdiscectomy Left 06/08/2014    Procedure: Left Lumbar Five-Sacral One  Microdiskectomy;  Surgeon: Kristeen Miss, MD;  Location: [place] [facility];  Service: Neurosurgery;  Laterality: Left;  Left L5-S1 Microdiskectomy  . Eye surgery      reports that she has never smoked. She has never used smokeless tobacco. She reports that she does not drink alcohol or use illicit drugs. family history includes Arthritis in her father and mother; Heart disease in her father; Heart failure in her father; Hyperlipidemia in her father and mother; Hypertension in her mother. Allergies  Allergen Reactions  . Codeine     Facial swelling  . Cortisone     Insomnia, heart palpitations (po only)  . Sulfonamide Derivatives     As a child      Review of Systems  Constitutional: Negative for fever, chills and appetite change.  Gastrointestinal: Negative for nausea, vomiting, abdominal pain, diarrhea and constipation.  Genitourinary: Positive for dysuria and frequency.  Musculoskeletal: Negative for back pain.  Neurological: Negative for dizziness.       Objective:   Physical Exam  Constitutional: She appears well-developed and well-nourished.  HENT:  Head: Normocephalic and atraumatic.  Neck: Neck supple. No thyromegaly present.  Cardiovascular: Normal rate, regular rhythm and normal heart sounds.   Pulmonary/Chest: Breath sounds normal.          Assessment & Plan:  UTI. Urine culture sent. Macrobid 1 twice a day for 5 days pending culture results.  She is strongly encouraged to drink lots of fluids

## 2015-06-20 NOTE — Patient Instructions (Signed)

## 2015-06-20 NOTE — Progress Notes (Signed)
Pre visit review using our clinic review tool, if applicable. No additional management support is needed unless otherwise documented below in the visit note. 

## 2015-06-22 ENCOUNTER — Other Ambulatory Visit: Payer: Self-pay | Admitting: Family Medicine

## 2015-06-22 ENCOUNTER — Telehealth: Payer: Self-pay | Admitting: Family Medicine

## 2015-06-22 LAB — URINE CULTURE: Colony Count: >=100000

## 2015-06-22 NOTE — Telephone Encounter (Signed)
Refill for 1 year 

## 2015-06-22 NOTE — Telephone Encounter (Signed)
Pt last visit 06/20/15 and las Rx refills 12/01/14 with 1 refill

## 2015-06-22 NOTE — Telephone Encounter (Signed)
Patient denies fever.  Advised her to increase water intake and we would contact her as soon as urine culture results.

## 2015-06-22 NOTE — Telephone Encounter (Signed)
Pt said she was in on Monday for a UTI and was told to call back if not better. Pt said she is still having the occasional burning and would like a call back

## 2015-06-22 NOTE — Telephone Encounter (Signed)
Urine cx is pending.  Does she have any fever?  Until we get culture back we cannot determine if current antibiotic is covering her infection.

## 2015-06-23 ENCOUNTER — Telehealth: Payer: Self-pay | Admitting: Family Medicine

## 2015-06-23 DIAGNOSIS — M5410 Radiculopathy, site unspecified: Secondary | ICD-10-CM | POA: Diagnosis not present

## 2015-06-23 DIAGNOSIS — M4150 Other secondary scoliosis, site unspecified: Secondary | ICD-10-CM | POA: Diagnosis not present

## 2015-06-23 NOTE — Telephone Encounter (Signed)
Spoke with patient, educated, and answered all questions.

## 2015-06-23 NOTE — Telephone Encounter (Signed)
Patient Name: [name]  DOB: 1933/12/13    Initial Comment Caller states was in Monday and had culture for UTI; [name] called back last night and changed med; didn't quite under stand her; thinks she said she has staph infection; on cipro; husband going into surgery for carpal tunnel in the morning; wants to be sure not contagious;    Nurse Assessment  Nurse: Raphael Gibney, RN, Vanita Ingles Date/Time (Eastern Time): 06/23/2015 1:26:46 PM  Confirm and document reason for call. If symptomatic, describe symptoms. ---Caller states she was in the office and they did a urine culture. She was started on Macrobid. [name] called and said they were changing her medication to Cipro. Her spouse is supposed to have carpal tunnel surgery tomorrow. She has taken 2 doses of the Cipro. The burning when she urinates has subsided although she is still having some frequency. She thinks that the nurse told her yesterday that she has staph in her urine. She wants to know what the results of her urine culture is and wants to make sure she is not contagious to her spouse who is having the carpal tunnel surgery tomorrow.  Has the patient traveled out of the country within the last 30 days? ---Not Applicable  Does the patient have any new or worsening symptoms? ---No  Please document clinical information provided and list any resource used. ---Advised caller that UTI is not contagious and a note would be placed for someone to call her back regarding the results of her urine culture as per nursing knowledge. Verbalized understanding.     Guidelines    Guideline Title Affirmed Question Affirmed Notes       Final Disposition User   Clinical Call Woodruff, RN, Vanita Ingles

## 2015-07-13 ENCOUNTER — Other Ambulatory Visit: Payer: Self-pay | Admitting: Family Medicine

## 2015-07-13 ENCOUNTER — Ambulatory Visit (INDEPENDENT_AMBULATORY_CARE_PROVIDER_SITE_OTHER): Payer: Medicare Other | Admitting: Internal Medicine

## 2015-07-13 ENCOUNTER — Encounter: Payer: Self-pay | Admitting: Internal Medicine

## 2015-07-13 VITALS — BP 146/70 | Temp 97.9°F | Ht 66.5 in | Wt 131.5 lb

## 2015-07-13 DIAGNOSIS — N390 Urinary tract infection, site not specified: Secondary | ICD-10-CM | POA: Diagnosis not present

## 2015-07-13 DIAGNOSIS — R300 Dysuria: Secondary | ICD-10-CM | POA: Diagnosis not present

## 2015-07-13 LAB — POCT URINALYSIS DIPSTICK
Bilirubin, UA: NEGATIVE
Glucose, UA: NEGATIVE
Ketones, UA: NEGATIVE
LEUKOCYTES UA: NEGATIVE
NITRITE UA: NEGATIVE
PH UA: 6
PROTEIN UA: NEGATIVE
RBC UA: NEGATIVE
Spec Grav, UA: >=1.03
UROBILINOGEN UA: 0.2

## 2015-07-13 MED ORDER — CIPROFLOXACIN HCL 500 MG PO TABS: 500.0000 mg | ORAL_TABLET | Freq: Two times a day (BID) | ORAL | Status: DC

## 2015-07-13 NOTE — Patient Instructions (Signed)
Sure sounds like infection again   Although urinalysis screen is normal .  Will reculture   relook in to price of vagina lestrogens .  rx with cipro for 3 days   Fu with Dr Elease Hashimoto if needed

## 2015-07-13 NOTE — Progress Notes (Signed)
Pre visit review using our clinic review tool, if applicable. No additional management support is needed unless otherwise documented below in the visit note.  Chief Complaint  Patient presents with  . Dysuria    Started yesterday  . Abdominal Discomfort    HPI: Patient [name]  comes in today for SDA for  new problem evaluation. PCP NA   Seen for dysuria 21 days ago  rx with macrobid for 5 days   Ad then cipro for atypica germ serratia ... Got better on the cipro  but now   Has 1 days of sig dysuria  Just like before adn concerned about up coming back surgery . Dr Ellene Route Has hx of infrequent dyuria and rx for uti Used to use  Topical estrogen but  Cost about 200 $ not pain for so not using . ROS: See pertinent positives and negatives per HPI. No fever chills   Past Medical History  Diagnosis Date  . Atrial enlargement, left   . Mitral regurgitation   . Mitral valve prolapse   . Palpitations   . Arthritis   . Depression   . GERD (gastroesophageal reflux disease)   . Allergy   . Hyperlipidemia   . Hypertension   . 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 PONV (postoperative nausea and vomiting)   . H/O hiatal hernia   . Heart murmur     06/10/2014 seeing new cardiologist  . Heart murmur   . Diabetes mellitus without complication (HCC)     fasting cbg 110s  . Anxiety   . Headache(784.0)   . Family history of adverse reaction to anesthesia     doesn't know     Family History  Problem Relation Age of Onset  . Heart failure Father   . Arthritis Father   . Hyperlipidemia Father   . Heart disease Father   . Arthritis Mother   . Hyperlipidemia Mother   . Hypertension Mother     Social History   Social History  . Marital Status: Married    Spouse Name: N/A  . Number of Children: N/A  . Years of Education: N/A   Occupational History  . Retired    Social History Main Topics  . Smoking status: Never Smoker   . Smokeless tobacco: Never Used  . Alcohol Use: No     Comment:  newly discovered diabetes, she has stopped her alcohol intake  . Drug Use: No  . Sexual Activity: Not Asked   Other Topics Concern  . None   Social History Narrative   Married   Regular exercise    Outpatient Prescriptions Prior to Visit  Medication Sig Dispense Refill  . bisacodyl (DULCOLAX) 5 MG EC tablet Take 10-15 mg by mouth once a week.    . citalopram (CELEXA) 20 MG tablet Take 1 tablet by mouth  daily 90 tablet 2  . clonazePAM (KLONOPIN) 1 MG tablet TAKE 1/2 TABLET BY MOUTH EVERY NIGHT AT BEDTIME AS DIRECTED 30 tablet 5  . Cranberry (SM CRANBERRY) 300 MG tablet Take 300 mg by mouth every morning.    . diltiazem (DILACOR XR) 240 MG 24 hr capsule Take 1 capsule (240 mg total) by mouth daily. 90 capsule 3  . fluticasone (FLONASE) 50 MCG/ACT nasal spray USE 2 SPRAYS IN EACH NOSTRIL DAILY AS NEEDED FOR ALLERGIES 16 g 0  . HYDROcodone-acetaminophen (NORCO/VICODIN) 5-325 MG per tablet Take 1-2 tablets by mouth every 4 (four) hours as needed (mild pain). 60 tablet  0  . ibuprofen (ADVIL,MOTRIN) 200 MG tablet Take 400 mg by mouth every 6 (six) hours as needed for mild pain.     [name] lisinopril-hydrochlorothiazide (PRINZIDE,ZESTORETIC) 10-12.5 MG per tablet Take 1 tablet by mouth daily. 90 tablet 3  . metFORMIN (GLUCOPHAGE) 500 MG tablet Take 2 tablets by mouth  every morning and 1 tablet  by mouth before supper 270 tablet 2  . Multiple Vitamins-Minerals (MULTIVITAMIN & MINERAL PO) Take 1 tablet by mouth daily.    Glory Rosebush DELICA LANCETS 08Q MISC TEST TWICE DAILY AS DIRECTED 100 each 3  . ONETOUCH VERIO test strip CHECK BLOOD GLUCOSE TWICE DAILY AS DIRECTED 100 each 1  . Probiotic CAPS Take 1 capsule by mouth daily.    . Cholecalciferol (VITAMIN D) 1000 UNITS capsule Take 1,000 Units by mouth daily.      [name] gabapentin (NEURONTIN) 100 MG capsule TAKE ONE CAPSULE BY MOUTH THREE TIMES DAILY. (Patient not taking: Reported on 07/13/2015) 90 capsule 1  . methocarbamol (ROBAXIN) 500 MG tablet Take 1  tablet (500 mg total) by mouth every 6 (six) hours as needed for muscle spasms. (Patient not taking: Reported on 07/13/2015) 60 tablet 3  . nitrofurantoin, macrocrystal-monohydrate, (MACROBID) 100 MG capsule Take 1 capsule (100 mg total) by mouth 2 (two) times daily. 10 capsule 0   No facility-administered medications prior to visit.     EXAM:  BP 146/70 mmHg  Temp(Src) 97.9 F (36.6 C) (Oral)  Ht 5' 6.5" (1.689 m)  Wt 131 lb 8 oz (59.648 kg)  BMI 20.91 kg/m2  Body mass index is 20.91 kg/(m^2).  GENERAL: vitals reviewed and listed above, alert, oriented, appears well hydrated and in no acute distress HEENT: atraumatic, conjunctiva  clear, no obvious abnormalities on inspection of external nose and ears  NECK: no obvious masses on inspection palpation  Abdomen:  Sof,t normal bowel sounds without hepatosplenomegaly, no guarding rebound or masses no CVA tendernessCV: HRRR, no clubbing cyanosis or  peripheral edema nl cap refill  MS: moves all extremities without noticeable focal  Abnormality slowly  PSYCH: pleasant and cooperative, no obvious depression or anxiety  ASSESSMENT AND PLAN:  Discussed the following assessment and plan:  Recurrent UTI - Plan: POC Urinalysis Dipstick, Culture, Urine  Dysuria - Plan: POC Urinalysis Dipstick, Culture, Urine ua is clear by dx but early  Sx   Can wait on culture but she says feels just like before     Empiric rx 3 days cipro in the interim   Look into   Topical estrogen price again.  Other  -Patient advised to return or notify health care team  if symptoms worsen ,persist or new concerns arise.  Patient Instructions  Sure sounds like infection again   Although urinalysis screen is normal .  Will reculture   relook in to price of vagina lestrogens .  rx with cipro for 3 days   Fu with Dr Elease Hashimoto if needed      Standley Brooking. Eligio Angert M.D.

## 2015-07-16 LAB — URINE CULTURE: Colony Count: >=100000

## 2015-07-17 NOTE — Progress Notes (Signed)
Quick Note:  Tell patient that urine culture shows Enterococcus  Take antibiotic Fu if not better ______

## 2015-07-20 ENCOUNTER — Encounter: Payer: Self-pay | Admitting: Family Medicine

## 2015-07-20 ENCOUNTER — Ambulatory Visit (INDEPENDENT_AMBULATORY_CARE_PROVIDER_SITE_OTHER): Payer: Medicare Other | Admitting: Family Medicine

## 2015-07-20 VITALS — BP 122/88 | HR 88 | Temp 97.3°F | Resp 16 | Ht 66.5 in | Wt 130.4 lb

## 2015-07-20 DIAGNOSIS — R300 Dysuria: Secondary | ICD-10-CM | POA: Diagnosis not present

## 2015-07-20 DIAGNOSIS — E119 Type 2 diabetes mellitus without complications: Secondary | ICD-10-CM

## 2015-07-20 DIAGNOSIS — I10 Essential (primary) hypertension: Secondary | ICD-10-CM | POA: Diagnosis not present

## 2015-07-20 LAB — POCT URINALYSIS DIPSTICK
BILIRUBIN UA: NEGATIVE
Blood, UA: NEGATIVE
GLUCOSE UA: NEGATIVE
Nitrite, UA: NEGATIVE
PH UA: 5.5
UROBILINOGEN UA: 0.2

## 2015-07-20 LAB — HEMOGLOBIN A1C: Hgb A1c MFr Bld: 6.4 % (ref 4.6–6.5)

## 2015-07-20 NOTE — Progress Notes (Signed)
Subjective:    Patient ID: [name], female    DOB: 1933-09-13, 81 y.o.   MRN: [id number]  HPI Patient here for six-month follow-up  She's had some chronic back pain with surgery a year ago and is preparing to have repeat low back surgery in a couple of weeks.  Recent UTI. She had UTI with Serratia species in October and enterococcus last week. Symptomatically improved following treatment with Cipro. No fevers or chills.  Type 2 diabetes. History of good control. Last A1c 6.3%. Remains on metformin. Blood sugar stable. No polyuria or polydipsia.  Hypertension treated with diltiazem and lisinopril HCTZ. Blood pressure stable. No dizziness. No chest pains. No exertional dyspnea. Flu vaccine already given  Past Medical History  Diagnosis Date  . Atrial enlargement, left   . Mitral regurgitation   . Mitral valve prolapse   . Palpitations   . Arthritis   . Depression   . GERD (gastroesophageal reflux disease)   . Allergy   . Hyperlipidemia   . Hypertension   . 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 PONV (postoperative nausea and vomiting)   . H/O hiatal hernia   . Heart murmur     06/10/2014 seeing new cardiologist  . Heart murmur   . Diabetes mellitus without complication (HCC)     fasting cbg 110s  . Anxiety   . Headache(784.0)   . Family history of adverse reaction to anesthesia     doesn't know    Past Surgical History  Procedure Laterality Date  . Anterior posterior and enterocele repairs  09/21/2004    With uterosacral cardinal colposuspension, partial colpocleisis; Selinda Orion, MD  . Breast enhancement surgery  02/25/2002    Bilateral reduction and excision of accessory breast tissue underneath the left breast; Aretha Parrot., MD  . Tonsillectomy and adenoidectomy    . Abdominal hysterectomy    . Cataract extraction Bilateral   . Breast surgery  11    reduction  . Rotator cuff repair Bilateral     11.12  . Back surgery    . Lumbar laminectomy/ decompression  with met-rx N/A 03/16/2014    Procedure: LUMBAR FIVE-SACRAL ONE EXTRAFORAMINAL DISKECTOMY WITH METREX;  Surgeon: Kristeen Miss, MD;  Location: [place] [facility];  Service: Neurosurgery;  Laterality: N/A;  . Lumbar laminectomy/decompression microdiscectomy Left 06/08/2014    Procedure: Left Lumbar Five-Sacral One Microdiskectomy;  Surgeon: Kristeen Miss, MD;  Location: [place] [facility];  Service: Neurosurgery;  Laterality: Left;  Left L5-S1 Microdiskectomy  . Eye surgery      reports that she has never smoked. She has never used smokeless tobacco. She reports that she does not drink alcohol or use illicit drugs. family history includes Arthritis in her father and mother; Heart disease in her father; Heart failure in her father; Hyperlipidemia in her father and mother; Hypertension in her mother. Allergies  Allergen Reactions  . Codeine     Facial swelling  . Cortisone     Insomnia, heart palpitations (po only)  . Statins Other (See Comments)    Myalgias   . Sulfonamide Derivatives     As a child      Review of Systems  Constitutional: Negative for fever, chills and fatigue.  Eyes: Negative for visual disturbance.  Respiratory: Negative for cough, chest tightness, shortness of breath and wheezing.   Cardiovascular: Negative for chest pain, palpitations and leg swelling.  Gastrointestinal: Negative for abdominal pain.  Endocrine: Negative for polydipsia and polyuria.  Musculoskeletal: Positive  for back pain.  Neurological: Negative for dizziness, seizures, syncope, weakness, light-headedness and headaches.       Objective:   Physical Exam  Constitutional: She is oriented to person, place, and time. She appears well-developed and well-nourished. No distress.  Neck: Neck supple. No JVD present. No thyromegaly present.  Cardiovascular: Normal rate and regular rhythm.   Pulmonary/Chest: Effort normal and breath sounds normal. No respiratory distress. She has no wheezes. She has no rales.    Musculoskeletal: She exhibits no edema.  Neurological: She is alert and oriented to person, place, and time.          Assessment & Plan:  #1 hypertension stable and at goal. Continue current medications #2 type 2 diabetes. History of excellent control. Recheck A1c #3 recent UTI. With upcoming surgery, will check repeat urinalysis- though she is asymptomatic at this time.

## 2015-07-20 NOTE — Progress Notes (Signed)
Pre visit review using our clinic review tool, if applicable. No additional management support is needed unless otherwise documented below in the visit note. 

## 2015-07-20 NOTE — Addendum Note (Signed)
Addended by: Elio Forget on: 07/20/2015 01:03 PM   Modules accepted: Orders

## 2015-07-23 LAB — URINE CULTURE

## 2015-07-29 ENCOUNTER — Encounter: Payer: Self-pay | Admitting: Family Medicine

## 2015-07-29 ENCOUNTER — Other Ambulatory Visit: Payer: Self-pay | Admitting: Neurological Surgery

## 2015-07-29 ENCOUNTER — Ambulatory Visit (INDEPENDENT_AMBULATORY_CARE_PROVIDER_SITE_OTHER): Payer: Medicare Other | Admitting: Family Medicine

## 2015-07-29 VITALS — BP 110/80 | HR 74 | Temp 97.9°F | Resp 16 | Ht 66.5 in | Wt 124.0 lb

## 2015-07-29 DIAGNOSIS — R300 Dysuria: Secondary | ICD-10-CM | POA: Diagnosis not present

## 2015-07-29 LAB — POCT URINALYSIS DIPSTICK
BILIRUBIN UA: NEGATIVE
GLUCOSE UA: NEGATIVE
KETONES UA: NEGATIVE
Protein, UA: NEGATIVE
Spec Grav, UA: 1.01
Urobilinogen, UA: 0.2
pH, UA: 7

## 2015-07-29 MED ORDER — CIPROFLOXACIN HCL 500 MG PO TABS: 500.0000 mg | ORAL_TABLET | Freq: Two times a day (BID) | ORAL | Status: DC

## 2015-07-29 NOTE — Progress Notes (Signed)
Subjective:    Patient ID: [name], female    DOB: 03-16-1934, 81 y.o.   MRN: [id number]  HPI   Patient's has hx of frequent UTIs. She was recently here and had follow-up urine which grew 20,000 colony count of Pseudomonas but she was asymptomatic at that time that was follow-up from prior culture. She's had couple of recent infections with Serratia and enterococcus. She was doing well until a few days ago and she had recurrent urinary frequency and burning. No fevers or chills. No nausea or vomiting. No abdominal pain.  Past Medical History  Diagnosis Date  . Atrial enlargement, left   . Mitral regurgitation   . Mitral valve prolapse   . Palpitations   . Arthritis   . Depression   . GERD (gastroesophageal reflux disease)   . Allergy   . Hyperlipidemia   . Hypertension   . 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 PONV (postoperative nausea and vomiting)   . H/O hiatal hernia   . Heart murmur     06/10/2014 seeing new cardiologist  . Heart murmur   . Diabetes mellitus without complication (HCC)     fasting cbg 110s  . Anxiety   . Headache(784.0)   . Family history of adverse reaction to anesthesia     doesn't know    Past Surgical History  Procedure Laterality Date  . Anterior posterior and enterocele repairs  09/21/2004    With uterosacral cardinal colposuspension, partial colpocleisis; Selinda Orion, MD  . Breast enhancement surgery  02/25/2002    Bilateral reduction and excision of accessory breast tissue underneath the left breast; Aretha Parrot., MD  . Tonsillectomy and adenoidectomy    . Abdominal hysterectomy    . Cataract extraction Bilateral   . Breast surgery  11    reduction  . Rotator cuff repair Bilateral     11.12  . Back surgery    . Lumbar laminectomy/ decompression with met-rx N/A 03/16/2014    Procedure: LUMBAR FIVE-SACRAL ONE EXTRAFORAMINAL DISKECTOMY WITH METREX;  Surgeon: Kristeen Miss, MD;  Location: [place] [facility];  Service: Neurosurgery;  Laterality:  N/A;  . Lumbar laminectomy/decompression microdiscectomy Left 06/08/2014    Procedure: Left Lumbar Five-Sacral One Microdiskectomy;  Surgeon: Kristeen Miss, MD;  Location: [place] [facility];  Service: Neurosurgery;  Laterality: Left;  Left L5-S1 Microdiskectomy  . Eye surgery      reports that she has never smoked. She has never used smokeless tobacco. She reports that she does not drink alcohol or use illicit drugs. family history includes Arthritis in her father and mother; Heart disease in her father; Heart failure in her father; Hyperlipidemia in her father and mother; Hypertension in her mother. Allergies  Allergen Reactions  . Codeine     Facial swelling  . Cortisone     Insomnia, heart palpitations (po only)  . Statins Other (See Comments)    Myalgias   . Sulfonamide Derivatives     As a child      Review of Systems  Constitutional: Negative for fever and chills.  Respiratory: Negative for cough.   Gastrointestinal: Negative for nausea, vomiting and abdominal pain.  Genitourinary: Positive for dysuria.       Objective:   Physical Exam  Constitutional: She appears well-developed and well-nourished.  Cardiovascular: Normal rate and regular rhythm.   Pulmonary/Chest: Effort normal and breath sounds normal. No respiratory distress. She has no wheezes. She has no rales.  Neurological: She is alert.  Assessment & Plan:  Recurrent dysuria in a patient with history of frequent UTIs. Urine dipstick suggestive of UTI. Set up repeat urine culture. Start Cipro 500 mg twice a day for 5 days

## 2015-07-29 NOTE — Progress Notes (Signed)
Pre visit review using our clinic review tool, if applicable. No additional management support is needed unless otherwise documented below in the visit note. 

## 2015-07-29 NOTE — Patient Instructions (Signed)

## 2015-07-31 LAB — URINE CULTURE: Colony Count: 6000

## 2015-08-01 ENCOUNTER — Encounter (HOSPITAL_COMMUNITY): Payer: Self-pay

## 2015-08-01 ENCOUNTER — Encounter (HOSPITAL_COMMUNITY)
Admission: RE | Admit: 2015-08-01 | Discharge: 2015-08-01 | Disposition: A | Discharge status: Home / Self Care | Payer: Medicare Other | Source: Ambulatory Visit | Attending: Neurological Surgery | Admitting: Neurological Surgery

## 2015-08-01 DIAGNOSIS — Z01818 Encounter for other preprocedural examination: Secondary | ICD-10-CM | POA: Insufficient documentation

## 2015-08-01 DIAGNOSIS — Z7984 Long term (current) use of oral hypoglycemic drugs: Secondary | ICD-10-CM | POA: Diagnosis not present

## 2015-08-01 DIAGNOSIS — Z79899 Other long term (current) drug therapy: Secondary | ICD-10-CM | POA: Insufficient documentation

## 2015-08-01 DIAGNOSIS — E785 Hyperlipidemia, unspecified: Secondary | ICD-10-CM | POA: Diagnosis not present

## 2015-08-01 DIAGNOSIS — Z01812 Encounter for preprocedural laboratory examination: Secondary | ICD-10-CM | POA: Diagnosis not present

## 2015-08-01 DIAGNOSIS — Z0183 Encounter for blood typing: Secondary | ICD-10-CM | POA: Diagnosis not present

## 2015-08-01 DIAGNOSIS — K219 Gastro-esophageal reflux disease without esophagitis: Secondary | ICD-10-CM | POA: Insufficient documentation

## 2015-08-01 DIAGNOSIS — I10 Essential (primary) hypertension: Secondary | ICD-10-CM | POA: Insufficient documentation

## 2015-08-01 DIAGNOSIS — E119 Type 2 diabetes mellitus without complications: Secondary | ICD-10-CM | POA: Insufficient documentation

## 2015-08-01 DIAGNOSIS — I341 Nonrheumatic mitral (valve) prolapse: Secondary | ICD-10-CM | POA: Diagnosis not present

## 2015-08-01 DIAGNOSIS — F419 Anxiety disorder, unspecified: Secondary | ICD-10-CM | POA: Diagnosis not present

## 2015-08-01 HISTORY — DX: Urinary tract infection, site not specified: N39.0

## 2015-08-01 HISTORY — DX: Unspecified hearing loss, unspecified ear: H91.90

## 2015-08-01 LAB — CBC
HEMATOCRIT: 40.5 % (ref 36.0–46.0)
Hemoglobin: 13.5 g/dL (ref 12.0–15.0)
MCH: 31 pg (ref 26.0–34.0)
MCHC: 33.3 g/dL (ref 30.0–36.0)
MCV: 92.9 fL (ref 78.0–100.0)
Platelets: 309 10*3/uL (ref 150–400)
RBC: 4.36 MIL/uL (ref 3.87–5.11)
RDW: 14.7 % (ref 11.5–15.5)
WBC: 6.4 10*3/uL (ref 4.0–10.5)

## 2015-08-01 LAB — TYPE AND SCREEN
ABO/RH(D): O NEG
Antibody Screen: NEGATIVE

## 2015-08-01 LAB — BASIC METABOLIC PANEL
Anion gap: 10 (ref 5–15)
BUN: 9 mg/dL (ref 6–20)
CALCIUM: 9.8 mg/dL (ref 8.9–10.3)
CO2: 23 mmol/L (ref 22–32)
CREATININE: 0.64 mg/dL (ref 0.44–1.00)
Chloride: 99 mmol/L — ABNORMAL LOW (ref 101–111)
GFR calc Af Amer: >60 mL/min (ref >60)
GFR calc non Af Amer: >60 mL/min (ref >60)
GLUCOSE: 101 mg/dL — AB (ref 65–99)
Potassium: 4 mmol/L (ref 3.5–5.1)
Sodium: 132 mmol/L — ABNORMAL LOW (ref 135–145)

## 2015-08-01 LAB — SURGICAL PCR SCREEN
MRSA, PCR: NEGATIVE
Staphylococcus aureus: NEGATIVE

## 2015-08-01 NOTE — Progress Notes (Signed)
PCP is [employer]  Cardiologist is [employer]. LOV was 01/28/15; encounter in EPIC  Patient denied having any acute cardiac or pulmonary issues  Nurse inquired about blood glucose levels and patient stated the highest her blood sugar has been was between 113 to 117, and the lowest was 95. Last A1C noted in EPIC was 6.4 on 07/20/15.

## 2015-08-01 NOTE — Pre-Procedure Instructions (Signed)
[name]  08/01/2015     Your procedure is scheduled on : Monday August 08, 2015 at 7:30 AM.  Report to [facility] Admitting at 5:30 AM.  Call this number if you have problems the morning of surgery: [phone]    Remember:  Do not eat food or drink liquids after midnight.  Take these medicines the morning of surgery with A SIP OF WATER : Citalopram (Celexa), Diltiazem, Flonase nasal spray if needed, Hydrocodone if needed   Stop taking any vitamins, herbal medications, Ibuprofen, Advil, Motrin, Aleve, Cranberry, etc   Do NOT take any diabetic medications the morning of your surgery (NO Metformin/Glucophage)  How to Manage Your Diabetes Before Surgery   Why is it important to control my blood sugar before and after surgery?   Improving blood sugar levels before and after surgery helps healing and can limit problems.  A way of improving blood sugar control is eating a healthy diet by:  - Eating less sugar and carbohydrates  - Increasing activity/exercise  - Talk with your doctor about reaching your blood sugar goals  High blood sugars (greater than 180 mg/dL) can raise your risk of infections and slow down your recovery so you will need to focus on controlling your diabetes during the weeks before surgery.  Make sure that the doctor who takes care of your diabetes knows about your planned surgery including the date and location.  How do I manage my blood sugars before surgery?   Check your blood sugar at least 4 times a day, 2 days before surgery to make sure that they are not too high or low.   Check your blood sugar the morning of your surgery when you wake up and every 2 hours until you get to the Short-Stay unit.  If your blood sugar is less than 70 mg/dL, you will need to treat for low blood sugar by:  Treat a low blood sugar (less than 70 mg/dL) with 1/2 cup of clear juice (cranberry or apple), 4 glucose tablets, OR glucose gel.  Recheck blood  sugar in 15 minutes after treatment (to make sure it is greater than 70 mg/dL).  If blood sugar is not greater than 70 mg/dL on re-check, call [phone] for further instructions.   Report your blood sugar to the Short-Stay nurse when you get to Short-Stay.  References:  [facility], 2007 "How to Manage your Diabetes Before and After Surgery".  What do I do about my diabetes medications?   Do not take oral diabetes medicines (pills) the morning of surgery.   Do not wear jewelry, make-up or nail polish.  Do not wear lotions, powders, or perfumes.    Do not shave 48 hours prior to surgery.    Do not bring valuables to the hospital.  [facility] is not responsible for any belongings or valuables.  Contacts, dentures or bridgework may not be worn into surgery.  Leave your suitcase in the car.  After surgery it may be brought to your room.  For patients admitted to the hospital, discharge time will be determined by your treatment team.  Patients discharged the day of surgery will not be allowed to drive home.   Name and phone number of your driver:    Special instructions:  Shower using CHG soap the night before and the morning of your surgery  Please read over the following fact sheets that you were given. Pain Booklet, Coughing and Deep Breathing, Blood  Transfusion Information, MRSA Information and Surgical Site Infection Prevention

## 2015-08-02 NOTE — Progress Notes (Signed)
Anesthesia Chart Review: Pt is 81 year old female scheduled for T8-L1 posterior lateral fusion with decompression T12-L1 on 08/08/15 by Dr. Ellene Route.  PCP is Dr. Elease Hashimoto, last visit 07/29/15. He is aware of plans for surgery (see 07/20/15 note). Cardiologist is Dr. Harrington Challenger, last office visit 01/28/15 with six month follow-up with echo recommended--which is scheduled for 09/22/15.   PMH includes MVP with moderate mitral regurgitation 12/2014, palpitations, HTN, hyperlipidemia, DM2, post-operative N/V, anxiety, positive TB test, GERD, hard of hearing (left ear). Never smoker. BMI 21. S/p L5-S1 microdiskectomy 06/08/14. S/p L5-S1 extraforaminal diskecotmy 03/16/14. S/p L5-S1 PLIF 03/08/15.  Meds include Cipro (patient told to stop 08/02/15 after 07/29/15 urine culture showed insignificant growth), Celexa, Klonopin, diltiazem, Benadryl, Flonase, Norco, ibuprofen, lisinopril-HCTZ, metformin.  Preoperative labs reviewed. A1C 07/20/15 was 6.4.  EKG 01/28/2015: NSR. Septal infarct, age undetermined.  I called and spoke with patient today.  She denied SOB, CP, new edema (has had chronic LLE edema for "years," and Dr. Harrington Challenger did not feel her 02/2015 echo finding gave any findings to explain this).  Echo 02/22/2015: - Left ventricle: The cavity size was normal. There was mild focal basal hypertrophy of the septum. Systolic function was vigorous. The estimated ejection fraction was in the range of 65% to 70%. Wall motion was normal; there were no regional wall motion abnormalities. - Mitral valve: Significant myxomaous mitral valve changes with severe bileaflet thickening and prolapse. There is at least moderate mitral regurgitation. There was moderate regurgitation directed centrally and posteriorly. Valve area by continuity equation (using LVOT flow): 1.75 cm^2. - Left atrium: The atrium was severely dilated. - Right ventricle: The cavity size was normal. Wall thickness was normal. Systolic function was normal. Impressions:-  Significant myxomaous mitral valve changes with severe bileaflet thickening and prolapse. Left atrium is severely dilated. There is at least moderate mitral regurgitation. If clinically indication, a TEE is recommended for further quantification. (Following review, Dr. Harrington Challenger recommended follow-up with repeat echo in November 2016. May need TEE in future.)  Had a cardiac cath in the 1970's and was diagnosed with MR at that time. Was told vessels were okay then.   Preoperative labs noted.   Reviewed above with anesthesiologist Dr. Therisa Doyne. Patient had echo within the past six months and tolerated surgery then. She denied any new CV/CHF symptoms. If no acute changes then it is anticipated that she can proceed as planned.  George Hugh [facility] Short Stay Center/Anesthesiology Phone [phone] 08/02/2015 4:34 PM

## 2015-08-07 MED ORDER — CEFAZOLIN SODIUM-DEXTROSE 2-3 GM-% IV SOLR
2.0000 g | INTRAVENOUS | Status: AC
  Administered 2015-08-08 (×2): 2 g via INTRAVENOUS
  Filled 2015-08-07: qty 50

## 2015-08-08 ENCOUNTER — Encounter (HOSPITAL_COMMUNITY)
Admission: RE | Disposition: A | Discharge status: Home Health | Payer: Medicare Other | Source: Ambulatory Visit | Attending: Neurological Surgery

## 2015-08-08 ENCOUNTER — Inpatient Hospital Stay (HOSPITAL_COMMUNITY): Payer: Medicare Other | Admitting: Vascular Surgery

## 2015-08-08 ENCOUNTER — Encounter (HOSPITAL_COMMUNITY): Payer: Self-pay | Admitting: *Deleted

## 2015-08-08 ENCOUNTER — Inpatient Hospital Stay (HOSPITAL_COMMUNITY): Payer: Medicare Other | Admitting: Anesthesiology

## 2015-08-08 ENCOUNTER — Inpatient Hospital Stay (HOSPITAL_COMMUNITY): Payer: Medicare Other

## 2015-08-08 ENCOUNTER — Inpatient Hospital Stay (HOSPITAL_COMMUNITY)
Admission: RE | Admit: 2015-08-08 | Discharge: 2015-08-12 | DRG: 458 | Disposition: A | Discharge status: Home Health | Payer: Medicare Other | Source: Ambulatory Visit | Attending: Neurological Surgery | Admitting: Neurological Surgery

## 2015-08-08 ENCOUNTER — Ambulatory Visit: Payer: Medicare Other | Admitting: Internal Medicine

## 2015-08-08 DIAGNOSIS — I340 Nonrheumatic mitral (valve) insufficiency: Secondary | ICD-10-CM | POA: Diagnosis not present

## 2015-08-08 DIAGNOSIS — G8918 Other acute postprocedural pain: Secondary | ICD-10-CM

## 2015-08-08 DIAGNOSIS — E119 Type 2 diabetes mellitus without complications: Secondary | ICD-10-CM | POA: Diagnosis not present

## 2015-08-08 DIAGNOSIS — K5900 Constipation, unspecified: Secondary | ICD-10-CM | POA: Diagnosis not present

## 2015-08-08 DIAGNOSIS — M4155 Other secondary scoliosis, thoracolumbar region: Secondary | ICD-10-CM | POA: Diagnosis not present

## 2015-08-08 DIAGNOSIS — F419 Anxiety disorder, unspecified: Secondary | ICD-10-CM | POA: Diagnosis not present

## 2015-08-08 DIAGNOSIS — M549 Dorsalgia, unspecified: Secondary | ICD-10-CM

## 2015-08-08 DIAGNOSIS — M4185 Other forms of scoliosis, thoracolumbar region: Principal | ICD-10-CM | POA: Diagnosis present

## 2015-08-08 DIAGNOSIS — F329 Major depressive disorder, single episode, unspecified: Secondary | ICD-10-CM | POA: Diagnosis present

## 2015-08-08 DIAGNOSIS — M5136 Other intervertebral disc degeneration, lumbar region: Secondary | ICD-10-CM | POA: Diagnosis not present

## 2015-08-08 DIAGNOSIS — Z4789 Encounter for other orthopedic aftercare: Secondary | ICD-10-CM | POA: Diagnosis not present

## 2015-08-08 DIAGNOSIS — K219 Gastro-esophageal reflux disease without esophagitis: Secondary | ICD-10-CM | POA: Diagnosis not present

## 2015-08-08 DIAGNOSIS — I10 Essential (primary) hypertension: Secondary | ICD-10-CM | POA: Diagnosis present

## 2015-08-08 DIAGNOSIS — E785 Hyperlipidemia, unspecified: Secondary | ICD-10-CM | POA: Diagnosis not present

## 2015-08-08 DIAGNOSIS — Z9889 Other specified postprocedural states: Secondary | ICD-10-CM | POA: Diagnosis not present

## 2015-08-08 DIAGNOSIS — Z981 Arthrodesis status: Secondary | ICD-10-CM | POA: Diagnosis not present

## 2015-08-08 DIAGNOSIS — M419 Scoliosis, unspecified: Secondary | ICD-10-CM | POA: Diagnosis present

## 2015-08-08 DIAGNOSIS — M5134 Other intervertebral disc degeneration, thoracic region: Secondary | ICD-10-CM | POA: Diagnosis not present

## 2015-08-08 DIAGNOSIS — Z419 Encounter for procedure for purposes other than remedying health state, unspecified: Secondary | ICD-10-CM

## 2015-08-08 DIAGNOSIS — M4326 Fusion of spine, lumbar region: Secondary | ICD-10-CM | POA: Diagnosis not present

## 2015-08-08 HISTORY — PX: POSTERIOR LUMBAR FUSION 4 LEVEL: SHX6037

## 2015-08-08 LAB — GLUCOSE, CAPILLARY
GLUCOSE-CAPILLARY: 108 mg/dL — AB (ref 65–99)
Glucose-Capillary: 165 mg/dL — ABNORMAL HIGH (ref 65–99)
Glucose-Capillary: 243 mg/dL — ABNORMAL HIGH (ref 65–99)
Glucose-Capillary: 181 mg/dL — ABNORMAL HIGH (ref 65–99)

## 2015-08-08 SURGERY — POSTERIOR LUMBAR FUSION 4 LEVEL
Anesthesia: General | Site: Back

## 2015-08-08 MED ORDER — KETOROLAC TROMETHAMINE 15 MG/ML IJ SOLN
15.0000 mg | Freq: Four times a day (QID) | INTRAMUSCULAR | Status: AC
  Administered 2015-08-08 – 2015-08-09 (×5): 15 mg via INTRAVENOUS
  Filled 2015-08-08 (×5): qty 1

## 2015-08-08 MED ORDER — SODIUM CHLORIDE 0.9 % IV SOLN
INTRAVENOUS | Status: DC
  Administered 2015-08-08: 16:00:00 via INTRAVENOUS

## 2015-08-08 MED ORDER — METHOCARBAMOL 500 MG PO TABS
500.0000 mg | ORAL_TABLET | Freq: Four times a day (QID) | ORAL | Status: DC | PRN
  Administered 2015-08-10 – 2015-08-11 (×2): 500 mg via ORAL
  Filled 2015-08-08 (×2): qty 1

## 2015-08-08 MED ORDER — FENTANYL CITRATE (PF) 100 MCG/2ML IJ SOLN
INTRAMUSCULAR | Status: DC | PRN
  Administered 2015-08-08 (×4): 50 ug via INTRAVENOUS
  Administered 2015-08-08: 100 ug via INTRAVENOUS
  Administered 2015-08-08: 50 ug via INTRAVENOUS
  Administered 2015-08-08: 100 ug via INTRAVENOUS
  Administered 2015-08-08: 50 ug via INTRAVENOUS

## 2015-08-08 MED ORDER — MIDAZOLAM HCL 2 MG/2ML IJ SOLN
INTRAMUSCULAR | Status: AC
  Filled 2015-08-08: qty 2

## 2015-08-08 MED ORDER — FENTANYL CITRATE (PF) 250 MCG/5ML IJ SOLN
INTRAMUSCULAR | Status: AC
  Filled 2015-08-08: qty 5

## 2015-08-08 MED ORDER — HYDROCODONE-ACETAMINOPHEN 5-325 MG PO TABS: 1.0000 | ORAL_TABLET | ORAL | Status: DC | PRN

## 2015-08-08 MED ORDER — ROCURONIUM BROMIDE 100 MG/10ML IV SOLN
INTRAVENOUS | Status: DC | PRN
  Administered 2015-08-08: 10 mg via INTRAVENOUS
  Administered 2015-08-08: 40 mg via INTRAVENOUS

## 2015-08-08 MED ORDER — BUPIVACAINE HCL (PF) 0.5 % IJ SOLN
INTRAMUSCULAR | Status: DC | PRN
  Administered 2015-08-08: 7.5 mL

## 2015-08-08 MED ORDER — HYDROMORPHONE HCL 1 MG/ML IJ SOLN
INTRAMUSCULAR | Status: AC
  Administered 2015-08-08: 0.25 mg via INTRAVENOUS
  Filled 2015-08-08: qty 1

## 2015-08-08 MED ORDER — LISINOPRIL 10 MG PO TABS
10.0000 mg | ORAL_TABLET | Freq: Every day | ORAL | Status: DC
  Administered 2015-08-09 – 2015-08-11 (×2): 10 mg via ORAL
  Filled 2015-08-08 (×4): qty 1

## 2015-08-08 MED ORDER — HYDROMORPHONE HCL 1 MG/ML IJ SOLN
0.2500 mg | INTRAMUSCULAR | Status: DC | PRN
  Administered 2015-08-08 (×3): 0.25 mg via INTRAVENOUS

## 2015-08-08 MED ORDER — ALBUMIN HUMAN 5 % IV SOLN
INTRAVENOUS | Status: DC | PRN
  Administered 2015-08-08: 11:00:00 via INTRAVENOUS

## 2015-08-08 MED ORDER — LISINOPRIL-HYDROCHLOROTHIAZIDE 10-12.5 MG PO TABS: 1.0000 | ORAL_TABLET | Freq: Every day | ORAL | Status: DC

## 2015-08-08 MED ORDER — METHOCARBAMOL 1000 MG/10ML IJ SOLN
500.0000 mg | Freq: Four times a day (QID) | INTRAVENOUS | Status: DC | PRN
  Filled 2015-08-08: qty 5

## 2015-08-08 MED ORDER — RISAQUAD PO CAPS
1.0000 | ORAL_CAPSULE | Freq: Every day | ORAL | Status: DC
  Administered 2015-08-08 – 2015-08-12 (×5): 1 via ORAL
  Filled 2015-08-08 (×6): qty 1

## 2015-08-08 MED ORDER — INSULIN ASPART 100 UNIT/ML ~~LOC~~ SOLN
0.0000 [IU] | Freq: Three times a day (TID) | SUBCUTANEOUS | Status: DC
  Administered 2015-08-09: 2 [IU] via SUBCUTANEOUS
  Administered 2015-08-10 – 2015-08-12 (×3): 3 [IU] via SUBCUTANEOUS

## 2015-08-08 MED ORDER — 0.9 % SODIUM CHLORIDE (POUR BTL) OPTIME
TOPICAL | Status: DC | PRN
  Administered 2015-08-08: 1000 mL

## 2015-08-08 MED ORDER — SENNA 8.6 MG PO TABS
1.0000 | ORAL_TABLET | Freq: Two times a day (BID) | ORAL | Status: DC
  Administered 2015-08-08 – 2015-08-12 (×7): 8.6 mg via ORAL
  Filled 2015-08-08 (×8): qty 1

## 2015-08-08 MED ORDER — SCOPOLAMINE 1 MG/3DAYS TD PT72
MEDICATED_PATCH | TRANSDERMAL | Status: AC
  Administered 2015-08-08: 1 via TRANSDERMAL
  Filled 2015-08-08: qty 1

## 2015-08-08 MED ORDER — EPHEDRINE SULFATE 50 MG/ML IJ SOLN
INTRAMUSCULAR | Status: DC | PRN
  Administered 2015-08-08 (×2): 5 mg via INTRAVENOUS
  Administered 2015-08-08: 10 mg via INTRAVENOUS
  Administered 2015-08-08: 5 mg via INTRAVENOUS

## 2015-08-08 MED ORDER — ROCURONIUM BROMIDE 50 MG/5ML IV SOLN
INTRAVENOUS | Status: AC
  Filled 2015-08-08: qty 1

## 2015-08-08 MED ORDER — PHENYLEPHRINE HCL 10 MG/ML IJ SOLN
INTRAMUSCULAR | Status: AC
  Filled 2015-08-08: qty 1

## 2015-08-08 MED ORDER — LIDOCAINE-EPINEPHRINE 1 %-1:100000 IJ SOLN
INTRAMUSCULAR | Status: DC | PRN
  Administered 2015-08-08: 7.5 mL

## 2015-08-08 MED ORDER — FLEET ENEMA 7-19 GM/118ML RE ENEM
1.0000 | ENEMA | Freq: Once | RECTAL | Status: DC | PRN
  Filled 2015-08-08: qty 1

## 2015-08-08 MED ORDER — THROMBIN 20000 UNITS EX SOLR
CUTANEOUS | Status: DC | PRN
  Administered 2015-08-08: 20 mL via TOPICAL

## 2015-08-08 MED ORDER — SODIUM CHLORIDE 0.9 % IV SOLN: 250.0000 mL | INTRAVENOUS | Status: DC

## 2015-08-08 MED ORDER — PROPOFOL 10 MG/ML IV BOLUS
INTRAVENOUS | Status: DC | PRN
  Administered 2015-08-08: 150 mg via INTRAVENOUS

## 2015-08-08 MED ORDER — PHENOL 1.4 % MT LIQD: 1.0000 | OROMUCOSAL | Status: DC | PRN

## 2015-08-08 MED ORDER — ACETAMINOPHEN 10 MG/ML IV SOLN
INTRAVENOUS | Status: AC
  Administered 2015-08-08: 1000 mg via INTRAVENOUS
  Filled 2015-08-08: qty 100

## 2015-08-08 MED ORDER — CLONAZEPAM 1 MG PO TABS
1.0000 mg | ORAL_TABLET | Freq: Every day | ORAL | Status: DC
  Administered 2015-08-08 – 2015-08-11 (×4): 1 mg via ORAL
  Filled 2015-08-08 (×4): qty 1

## 2015-08-08 MED ORDER — CITALOPRAM HYDROBROMIDE 10 MG PO TABS
20.0000 mg | ORAL_TABLET | Freq: Every day | ORAL | Status: DC
  Administered 2015-08-09 – 2015-08-12 (×4): 20 mg via ORAL
  Filled 2015-08-08 (×4): qty 2

## 2015-08-08 MED ORDER — OXYCODONE HCL 5 MG/5ML PO SOLN: 5.0000 mg | Freq: Once | ORAL | Status: DC | PRN

## 2015-08-08 MED ORDER — PHENYLEPHRINE HCL 10 MG/ML IJ SOLN
INTRAMUSCULAR | Status: DC | PRN
  Administered 2015-08-08: 40 ug via INTRAVENOUS
  Administered 2015-08-08: 80 ug via INTRAVENOUS

## 2015-08-08 MED ORDER — ONDANSETRON HCL 4 MG/2ML IJ SOLN
4.0000 mg | INTRAMUSCULAR | Status: DC | PRN
  Administered 2015-08-11: 4 mg via INTRAVENOUS
  Filled 2015-08-08: qty 2

## 2015-08-08 MED ORDER — DIPHENHYDRAMINE HCL 25 MG PO CAPS: 25.0000 mg | ORAL_CAPSULE | Freq: Every day | ORAL | Status: DC | PRN

## 2015-08-08 MED ORDER — PHENYLEPHRINE 40 MCG/ML (10ML) SYRINGE FOR IV PUSH (FOR BLOOD PRESSURE SUPPORT)
PREFILLED_SYRINGE | INTRAVENOUS | Status: AC
  Filled 2015-08-08: qty 10

## 2015-08-08 MED ORDER — CEFAZOLIN SODIUM-DEXTROSE 2-3 GM-% IV SOLR
INTRAVENOUS | Status: AC
  Filled 2015-08-08: qty 50

## 2015-08-08 MED ORDER — ACETAMINOPHEN 325 MG PO TABS: 650.0000 mg | ORAL_TABLET | ORAL | Status: DC | PRN

## 2015-08-08 MED ORDER — OXYCODONE HCL 5 MG PO TABS: 5.0000 mg | ORAL_TABLET | Freq: Once | ORAL | Status: DC | PRN

## 2015-08-08 MED ORDER — ACETAMINOPHEN 650 MG RE SUPP: 650.0000 mg | RECTAL | Status: DC | PRN

## 2015-08-08 MED ORDER — HYDROCHLOROTHIAZIDE 12.5 MG PO CAPS
12.5000 mg | ORAL_CAPSULE | Freq: Every day | ORAL | Status: DC
  Administered 2015-08-09 – 2015-08-12 (×3): 12.5 mg via ORAL
  Filled 2015-08-08 (×4): qty 1

## 2015-08-08 MED ORDER — MENTHOL 3 MG MT LOZG: 1.0000 | LOZENGE | OROMUCOSAL | Status: DC | PRN

## 2015-08-08 MED ORDER — ONDANSETRON HCL 4 MG/2ML IJ SOLN
4.0000 mg | Freq: Four times a day (QID) | INTRAMUSCULAR | Status: AC | PRN
  Administered 2015-08-08: 4 mg via INTRAVENOUS

## 2015-08-08 MED ORDER — FLUTICASONE PROPIONATE 50 MCG/ACT NA SUSP
1.0000 | Freq: Every day | NASAL | Status: DC
  Administered 2015-08-09 – 2015-08-11 (×3): 1 via NASAL
  Filled 2015-08-08: qty 16

## 2015-08-08 MED ORDER — POLYETHYLENE GLYCOL 3350 17 G PO PACK: 17.0000 g | PACK | Freq: Every day | ORAL | Status: DC | PRN

## 2015-08-08 MED ORDER — THROMBIN 5000 UNITS EX SOLR
CUTANEOUS | Status: DC | PRN
  Administered 2015-08-08: 10 mL via TOPICAL

## 2015-08-08 MED ORDER — OXYCODONE-ACETAMINOPHEN 5-325 MG PO TABS
1.0000 | ORAL_TABLET | ORAL | Status: DC | PRN
  Administered 2015-08-08: 1 via ORAL
  Administered 2015-08-08: 2 via ORAL
  Administered 2015-08-09 (×4): 1 via ORAL
  Administered 2015-08-10 – 2015-08-12 (×8): 2 via ORAL
  Filled 2015-08-08: qty 2
  Filled 2015-08-08: qty 1
  Filled 2015-08-08 (×3): qty 2
  Filled 2015-08-08: qty 1
  Filled 2015-08-08 (×2): qty 2
  Filled 2015-08-08: qty 1
  Filled 2015-08-08 (×2): qty 2
  Filled 2015-08-08: qty 1
  Filled 2015-08-08 (×2): qty 2

## 2015-08-08 MED ORDER — DEXAMETHASONE SODIUM PHOSPHATE 10 MG/ML IJ SOLN
INTRAMUSCULAR | Status: AC
  Filled 2015-08-08: qty 1

## 2015-08-08 MED ORDER — DOCUSATE SODIUM 100 MG PO CAPS
100.0000 mg | ORAL_CAPSULE | Freq: Two times a day (BID) | ORAL | Status: DC
  Administered 2015-08-08 – 2015-08-11 (×6): 100 mg via ORAL
  Filled 2015-08-08 (×9): qty 1

## 2015-08-08 MED ORDER — INSULIN ASPART 100 UNIT/ML ~~LOC~~ SOLN: 0.0000 [IU] | Freq: Every day | SUBCUTANEOUS | Status: DC

## 2015-08-08 MED ORDER — SODIUM CHLORIDE 0.9 % IJ SOLN: 3.0000 mL | INTRAMUSCULAR | Status: DC | PRN

## 2015-08-08 MED ORDER — LIDOCAINE HCL (CARDIAC) 20 MG/ML IV SOLN
INTRAVENOUS | Status: AC
  Filled 2015-08-08: qty 5

## 2015-08-08 MED ORDER — SUCCINYLCHOLINE CHLORIDE 20 MG/ML IJ SOLN
INTRAMUSCULAR | Status: AC
  Filled 2015-08-08: qty 1

## 2015-08-08 MED ORDER — CIPROFLOXACIN HCL 500 MG PO TABS: 500.0000 mg | ORAL_TABLET | Freq: Two times a day (BID) | ORAL | Status: DC

## 2015-08-08 MED ORDER — SODIUM CHLORIDE 0.9 % IR SOLN
Status: DC | PRN
  Administered 2015-08-08: 500 mL

## 2015-08-08 MED ORDER — HYDROMORPHONE HCL 1 MG/ML IJ SOLN: 0.5000 mg | INTRAMUSCULAR | Status: DC | PRN

## 2015-08-08 MED ORDER — METFORMIN HCL 500 MG PO TABS
500.0000 mg | ORAL_TABLET | Freq: Two times a day (BID) | ORAL | Status: DC
  Administered 2015-08-08 – 2015-08-12 (×8): 500 mg via ORAL
  Filled 2015-08-08 (×8): qty 1

## 2015-08-08 MED ORDER — ONDANSETRON HCL 4 MG/2ML IJ SOLN
INTRAMUSCULAR | Status: AC
  Filled 2015-08-08: qty 2

## 2015-08-08 MED ORDER — BISACODYL 10 MG RE SUPP: 10.0000 mg | Freq: Every day | RECTAL | Status: DC | PRN

## 2015-08-08 MED ORDER — ALUM & MAG HYDROXIDE-SIMETH 200-200-20 MG/5ML PO SUSP: 30.0000 mL | Freq: Four times a day (QID) | ORAL | Status: DC | PRN

## 2015-08-08 MED ORDER — DEXAMETHASONE SODIUM PHOSPHATE 10 MG/ML IJ SOLN
INTRAMUSCULAR | Status: DC | PRN
  Administered 2015-08-08: 10 mg via INTRAVENOUS

## 2015-08-08 MED ORDER — DEXTROSE 5 % IV SOLN
10.0000 mg | INTRAVENOUS | Status: DC | PRN
  Administered 2015-08-08: 20 ug/min via INTRAVENOUS

## 2015-08-08 MED ORDER — LIDOCAINE HCL (CARDIAC) 20 MG/ML IV SOLN
INTRAVENOUS | Status: DC | PRN
  Administered 2015-08-08: 70 mg via INTRAVENOUS

## 2015-08-08 MED ORDER — CEFAZOLIN SODIUM 1-5 GM-% IV SOLN
1.0000 g | Freq: Three times a day (TID) | INTRAVENOUS | Status: AC
  Administered 2015-08-08 – 2015-08-09 (×2): 1 g via INTRAVENOUS
  Filled 2015-08-08 (×2): qty 50

## 2015-08-08 MED ORDER — LACTATED RINGERS IV SOLN
INTRAVENOUS | Status: DC | PRN
  Administered 2015-08-08 (×3): via INTRAVENOUS

## 2015-08-08 MED ORDER — SODIUM CHLORIDE 0.9 % IJ SOLN
3.0000 mL | Freq: Two times a day (BID) | INTRAMUSCULAR | Status: DC
Start: 2015-08-08 — End: 2015-08-12
  Administered 2015-08-09 – 2015-08-12 (×6): 3 mL via INTRAVENOUS

## 2015-08-08 MED ORDER — EPHEDRINE SULFATE 50 MG/ML IJ SOLN
INTRAMUSCULAR | Status: AC
  Filled 2015-08-08: qty 1

## 2015-08-08 MED ORDER — PROPOFOL 10 MG/ML IV BOLUS
INTRAVENOUS | Status: AC
  Filled 2015-08-08: qty 20

## 2015-08-08 MED ORDER — SCOPOLAMINE 1 MG/3DAYS TD PT72
MEDICATED_PATCH | TRANSDERMAL | Status: AC
  Filled 2015-08-08: qty 1

## 2015-08-08 MED ORDER — DILTIAZEM HCL ER COATED BEADS 240 MG PO CP24
240.0000 mg | ORAL_CAPSULE | Freq: Every day | ORAL | Status: DC
  Administered 2015-08-09 – 2015-08-11 (×3): 240 mg via ORAL
  Filled 2015-08-08 (×5): qty 1

## 2015-08-08 MED ORDER — SODIUM CHLORIDE 0.9 % IJ SOLN
INTRAMUSCULAR | Status: AC
  Filled 2015-08-08: qty 10

## 2015-08-08 SURGICAL SUPPLY — 66 items
BAG DECANTER FOR FLEXI CONT (MISCELLANEOUS) ×2 IMPLANT
BLADE CLIPPER SURG (BLADE) IMPLANT
BONE CANC CHIPS 20CC PCAN1/4 (Bone Implant) ×2 IMPLANT
BUR MATCHSTICK NEURO 3.0 LAGG (BURR) ×2 IMPLANT
CANISTER SUCT 3000ML PPV (MISCELLANEOUS) ×2 IMPLANT
CHIPS CANC BONE 20CC PCAN1/4 (Bone Implant) ×1 IMPLANT
CONNECTOR RELINE ROTATE 5-6MM (Connector) ×4 IMPLANT
CONT SPEC 4OZ CLIKSEAL STRL BL (MISCELLANEOUS) ×2 IMPLANT
CORDS BIPOLAR (ELECTRODE) ×2 IMPLANT
COVER BACK TABLE 60X90IN (DRAPES) ×2 IMPLANT
DECANTER SPIKE VIAL GLASS SM (MISCELLANEOUS) ×2 IMPLANT
DERMABOND ADHESIVE PROPEN (GAUZE/BANDAGES/DRESSINGS) ×1
DERMABOND ADVANCED (GAUZE/BANDAGES/DRESSINGS) ×1
DERMABOND ADVANCED .7 DNX12 (GAUZE/BANDAGES/DRESSINGS) ×1 IMPLANT
DERMABOND ADVANCED .7 DNX6 (GAUZE/BANDAGES/DRESSINGS) ×1 IMPLANT
DRAPE C-ARM 42X72 X-RAY (DRAPES) ×4 IMPLANT
DRAPE C-ARMOR (DRAPES) ×2 IMPLANT
DRAPE LAPAROTOMY 100X72X124 (DRAPES) ×2 IMPLANT
DRAPE POUCH INSTRU U-SHP 10X18 (DRAPES) ×2 IMPLANT
DRAPE PROXIMA HALF (DRAPES) IMPLANT
DURAPREP 26ML APPLICATOR (WOUND CARE) ×2 IMPLANT
ELECT REM PT RETURN 9FT ADLT (ELECTROSURGICAL) ×2
ELECTRODE REM PT RTRN 9FT ADLT (ELECTROSURGICAL) ×1 IMPLANT
GAUZE SPONGE 4X4 12PLY STRL (GAUZE/BANDAGES/DRESSINGS) ×2 IMPLANT
GAUZE SPONGE 4X4 16PLY XRAY LF (GAUZE/BANDAGES/DRESSINGS) IMPLANT
GLOVE BIOGEL PI IND STRL 8.5 (GLOVE) ×2 IMPLANT
GLOVE BIOGEL PI INDICATOR 8.5 (GLOVE) ×2
GLOVE ECLIPSE 8.5 STRL (GLOVE) ×4 IMPLANT
GLOVE EXAM NITRILE LRG STRL (GLOVE) IMPLANT
GLOVE EXAM NITRILE MD LF STRL (GLOVE) IMPLANT
GLOVE EXAM NITRILE XL STR (GLOVE) IMPLANT
GLOVE EXAM NITRILE XS STR PU (GLOVE) IMPLANT
GOWN STRL REUS W/ TWL LRG LVL3 (GOWN DISPOSABLE) IMPLANT
GOWN STRL REUS W/ TWL XL LVL3 (GOWN DISPOSABLE) IMPLANT
GOWN STRL REUS W/TWL 2XL LVL3 (GOWN DISPOSABLE) ×4 IMPLANT
GOWN STRL REUS W/TWL LRG LVL3 (GOWN DISPOSABLE)
GOWN STRL REUS W/TWL XL LVL3 (GOWN DISPOSABLE)
HEMOSTAT POWDER KIT SURGIFOAM (HEMOSTASIS) IMPLANT
KIT BASIN OR (CUSTOM PROCEDURE TRAY) ×2 IMPLANT
KIT INFUSE MEDIUM (Orthopedic Implant) ×2 IMPLANT
KIT ROOM TURNOVER OR (KITS) ×2 IMPLANT
MILL MEDIUM DISP (BLADE) ×2 IMPLANT
NEEDLE HYPO 22GX1.5 SAFETY (NEEDLE) ×2 IMPLANT
NS IRRIG 1000ML POUR BTL (IV SOLUTION) ×2 IMPLANT
PACK LAMINECTOMY NEURO (CUSTOM PROCEDURE TRAY) ×2 IMPLANT
PAD ARMBOARD 7.5X6 YLW CONV (MISCELLANEOUS) ×6 IMPLANT
PATTIES SURGICAL .5 X1 (DISPOSABLE) ×2 IMPLANT
ROD RELINE-O 5.5X300 STRT NS (Rod) ×2 IMPLANT
ROD RELINE-O 5.5X300MM STRT (Rod) ×2 IMPLANT
SCREW LOCK RELINE 5.5 TULIP (Screw) ×24 IMPLANT
SCREW RELINE-O POLY 5.5X45MM (Screw) ×12 IMPLANT
SCREW SHANK RELINE 5.5X45MM 2S (Screw) ×8 IMPLANT
SPINE TULIP RELINE MOD (Neuro Prosthesis/Implant) ×8 IMPLANT
SPONGE LAP 4X18 X RAY DECT (DISPOSABLE) IMPLANT
SPONGE SURGIFOAM ABS GEL 100 (HEMOSTASIS) ×2 IMPLANT
SUT VIC AB 1 CT1 18XBRD ANBCTR (SUTURE) ×2 IMPLANT
SUT VIC AB 1 CT1 8-18 (SUTURE) ×2
SUT VIC AB 2-0 CP2 18 (SUTURE) ×4 IMPLANT
SUT VIC AB 3-0 SH 8-18 (SUTURE) ×6 IMPLANT
SYR 3ML LL SCALE MARK (SYRINGE) ×8 IMPLANT
TOWEL OR 17X24 6PK STRL BLUE (TOWEL DISPOSABLE) ×2 IMPLANT
TOWEL OR 17X26 10 PK STRL BLUE (TOWEL DISPOSABLE) ×2 IMPLANT
TRAP SPECIMEN MUCOUS 40CC (MISCELLANEOUS) ×2 IMPLANT
TRAY FOLEY W/METER SILVER 14FR (SET/KITS/TRAYS/PACK) ×2 IMPLANT
TULIP SPINE RELINE MOD (Neuro Prosthesis/Implant) ×4 IMPLANT
WATER STERILE IRR 1000ML POUR (IV SOLUTION) ×2 IMPLANT

## 2015-08-08 NOTE — Progress Notes (Signed)
Patient BG greater than 140 on admission. Received ordered from dr. Kathyrn Sheriff for SSI and blood sugar checks.  Will continue to monitor.   Ave Filter, RN

## 2015-08-08 NOTE — Progress Notes (Signed)
Pt. Stated the chg shower  Caused itching when she used it. The nursing technician wipe her back, although the pt. Didn't alert her. Stated to the nurse her back was itching. I  offered to rinse her back off  But she said no.

## 2015-08-08 NOTE — Anesthesia Preprocedure Evaluation (Signed)
Anesthesia Evaluation  Patient identified by MRN, date of birth, ID band Patient awake    Reviewed: Allergy & Precautions, NPO status , Patient's Chart, lab work & pertinent test results  History of Anesthesia Complications (+) PONV  Airway Mallampati: II   Neck ROM: full    Dental   Pulmonary neg pulmonary ROS,    breath sounds clear to auscultation       Cardiovascular hypertension, + Valvular Problems/Murmurs MR  Rhythm:regular Rate:Normal     Neuro/Psych  Headaches, Anxiety Depression    GI/Hepatic hiatal hernia, GERD  ,  Endo/Other  diabetes, Type 2  Renal/GU      Musculoskeletal  (+) Arthritis ,   Abdominal   Peds  Hematology  (+) anemia ,   Anesthesia Other Findings   Reproductive/Obstetrics                             Anesthesia Physical Anesthesia Plan  ASA: III  Anesthesia Plan: General   Post-op Pain Management:    Induction: Intravenous  Airway Management Planned: Oral ETT  Additional Equipment:   Intra-op Plan:   Post-operative Plan: Extubation in OR  Informed Consent: I have reviewed the patients History and Physical, chart, labs and discussed the procedure including the risks, benefits and alternatives for the proposed anesthesia with the patient or authorized representative who has indicated his/her understanding and acceptance.     Plan Discussed with:   Anesthesia Plan Comments:         Anesthesia Quick Evaluation

## 2015-08-08 NOTE — Anesthesia Procedure Notes (Signed)
Procedure Name: Intubation Date/Time: 08/08/2015 7:44 AM Performed by: Rebekah Chesterfield L Pre-anesthesia Checklist: Patient identified, Emergency Drugs available, Suction available, Patient being monitored and Timeout performed Patient Re-evaluated:Patient Re-evaluated prior to inductionOxygen Delivery Method: Circle system utilized Preoxygenation: Pre-oxygenation with 100% oxygen Intubation Type: IV induction and Cricoid Pressure applied Ventilation: Mask ventilation without difficulty Laryngoscope Size: Mac and 3 Grade View: Grade III Tube type: Oral Tube size: 7.5 mm Number of attempts: 1 Airway Equipment and Method: Stylet Placement Confirmation: ETT inserted through vocal cords under direct vision,  positive ETCO2 and breath sounds checked- equal and bilateral Secured at: 21 cm Tube secured with: Tape Dental Injury: Teeth and Oropharynx as per pre-operative assessment

## 2015-08-08 NOTE — Anesthesia Postprocedure Evaluation (Signed)
Anesthesia Post Note  Patient: [name]  Procedure(s) Performed: Procedure(s) (LRB): T8-L1 posterior lateral fusion with decompression T12-L1 (N/A)  Patient location during evaluation: PACU Anesthesia Type: General Level of consciousness: awake and alert and patient cooperative Pain management: pain level controlled Vital Signs Assessment: post-procedure vital signs reviewed and stable Respiratory status: spontaneous breathing and respiratory function stable Cardiovascular status: stable Anesthetic complications: no    Last Vitals:  Filed Vitals:   08/08/15 1222 08/08/15 1238  BP: 108/81 93/32  Pulse: 77 78  Temp:    Resp: 24 20    Last Pain:  Filed Vitals:   08/08/15 1241  PainSc: 7     LLE Motor Response: Purposeful movement, Responds to commands LLE Sensation: No numbness, No tingling RLE Motor Response: Purposeful movement, Responds to commands RLE Sensation: No numbness, No tingling      Alif Petrak S

## 2015-08-08 NOTE — Progress Notes (Signed)
Patient arrived to 5C13 from PACU alert. She denied pain/nausea at this time. Safety precautions and orders reviewed at this time. Call light and possessions within reach. No other distress noted. Will continue to monitor.   Ave Filter, RN

## 2015-08-08 NOTE — Progress Notes (Signed)
Patient ID: [name], female   DOB: 23-Aug-1934, 81 y.o.   MRN: [id number] Vital signs are stable Motor function is intact Dressing is clean and dry Stable

## 2015-08-08 NOTE — H&P (Signed)
[name] is an 81 y.o. female.   Chief Complaint: Back pain and left flank pain at the thoracolumbar junction HPI: [name] is a 81 year old individual who about 5 years ago underwent surgical decompression and stabilization using an anterolateral technique in addition to percutaneous fixation from L1-L5 for degenerative scoliosis. 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 She is now admitted for this procedure.  Past Medical History  Diagnosis Date  . Atrial enlargement, left   . Mitral regurgitation   . Mitral valve prolapse   . Palpitations   . Arthritis   . Depression   . GERD (gastroesophageal reflux disease)   . Allergy   . Hyperlipidemia   . Hypertension   . 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 H/O hiatal hernia   . Heart murmur     06/10/2014 seeing new cardiologist  . Heart murmur   . Diabetes mellitus without complication (HCC)     fasting cbg 110s  . Anxiety   . Headache(784.0)   . PONV (postoperative nausea and vomiting)     Patient stated "I like the patch behind my ear"  . UTI (lower urinary tract infection)   . HOH (hard of hearing)     HOH in left ear; needs to speak to patient in right ear    Past Surgical History  Procedure Laterality Date  . Anterior posterior and enterocele repairs  09/21/2004    With uterosacral cardinal colposuspension, partial colpocleisis; Selinda Orion, MD  . Breast enhancement surgery  02/25/2002    Bilateral reduction and excision of accessory breast tissue underneath the left breast; Aretha Parrot., MD  . Tonsillectomy and adenoidectomy    . Abdominal hysterectomy    . Cataract extraction  Bilateral   . Rotator cuff repair Bilateral     11.12  . Back surgery    . Lumbar laminectomy/ decompression with met-rx N/A 03/16/2014    Procedure: LUMBAR FIVE-SACRAL ONE EXTRAFORAMINAL DISKECTOMY WITH METREX;  Surgeon: Kristeen Miss, MD;  Location: [place] [facility];  Service: Neurosurgery;  Laterality: N/A;  . Lumbar laminectomy/decompression microdiscectomy Left 06/08/2014    Procedure: Left Lumbar Five-Sacral One Microdiskectomy;  Surgeon: Kristeen Miss, MD;  Location: [place] [facility];  Service: Neurosurgery;  Laterality: Left;  Left L5-S1 Microdiskectomy  . Breast surgery  11    reduction  . Eye surgery Bilateral     cataract removal  . Colonoscopy    . Pelvic floor repair    . Cardiac catheterization      no PCI    Family History  Problem Relation Age of Onset  . Heart failure Father   . Arthritis Father   . Hyperlipidemia Father   . Heart disease Father   . Arthritis Mother   . Hyperlipidemia Mother   . Hypertension Mother    Social History:  reports that she has never smoked. She has never used smokeless tobacco. She reports that she drinks alcohol. She reports that she does not use illicit drugs.  Allergies:  Allergies  Allergen Reactions  . Chlorhexidine Gluconate [Chlorhexidine] Itching  . Codeine     Facial swelling  . Cortisone     Insomnia,  heart palpitations (po only)  . Statins Other (See Comments)    Myalgias   . Sulfonamide Derivatives     As a child    Medications Prior to Admission  Medication Sig Dispense Refill  . bisacodyl (DULCOLAX) 5 MG EC tablet Take 10-15 mg by mouth daily as needed for mild constipation.     . Cholecalciferol (VITAMIN D) 1000 UNITS capsule Take 1,000 Units by mouth daily.      . ciprofloxacin (CIPRO) 500 MG tablet Take 1 tablet (500 mg total) by mouth 2 (two) times daily. 10 tablet 0  . citalopram (CELEXA) 20 MG tablet Take 1 tablet by mouth  daily 90 tablet 2  . clonazePAM (KLONOPIN) 1 MG tablet TAKE 1/2 TABLET BY MOUTH EVERY NIGHT  AT BEDTIME AS DIRECTED 30 tablet 5  . Cranberry (SM CRANBERRY) 300 MG tablet Take 300 mg by mouth every morning.    . diltiazem (DILACOR XR) 240 MG 24 hr capsule Take 1 capsule (240 mg total) by mouth daily. 90 capsule 3  . diphenhydrAMINE (BENADRYL) 25 mg capsule Take 25 mg by mouth daily as needed for allergies (Takes only during allergy seasons).    . fluticasone (FLONASE) 50 MCG/ACT nasal spray SHAKE LIQUID AND USE 2 SPRAYS IN EACH NOSTRIL DAILY AS NEEDED FOR ALLERGIES 16 g 0  . HYDROcodone-acetaminophen (NORCO/VICODIN) 5-325 MG per tablet Take 1-2 tablets by mouth every 4 (four) hours as needed (mild pain). 60 tablet 0  . ibuprofen (ADVIL,MOTRIN) 200 MG tablet Take 400 mg by mouth every 6 (six) hours as needed for mild pain.     . lisinopril-hydrochlorothiazide (PRINZIDE,ZESTORETIC) 10-12.5 MG per tablet Take 1 tablet by mouth daily. 90 tablet 3  . metFORMIN (GLUCOPHAGE) 500 MG tablet Take 2 tablets by mouth  every morning and 1 tablet  by mouth before supper 270 tablet 2  . ONETOUCH DELICA LANCETS 33G MISC TEST TWICE DAILY AS DIRECTED 100 each 3  . ONETOUCH VERIO test strip CHECK BLOOD GLUCOSE TWICE DAILY AS DIRECTED 100 each 1  . Probiotic Product (PROBIOTIC DAILY PO) Take 1 capsule by mouth daily.      Results for orders placed or performed during the hospital encounter of 08/08/15 (from the past 48 hour(s))  Glucose, capillary     Status: Abnormal   Collection Time: 08/08/15  6:01 AM  Result Value Ref Range   Glucose-Capillary 108 (H) 65 - 99 mg/dL   No results found.  Review of Systems  HENT: Negative.   Eyes: Negative.   Respiratory: Negative.   Cardiovascular: Negative.   Gastrointestinal: Positive for melena.  Genitourinary: Negative.   Musculoskeletal: Positive for back pain.  Neurological: Positive for sensory change and weakness.  Endo/Heme/Allergies: Negative.   Psychiatric/Behavioral: Negative.     Blood pressure 123/37, pulse 65, temperature 98.4 F (36.9 C),  temperature source Oral, resp. rate 16, SpO2 100 %. Physical Exam  Constitutional: She is oriented to person, place, and time. She appears well-developed and well-nourished.  HENT:  Head: Normocephalic and atraumatic.  Eyes: Conjunctivae and EOM are normal. Pupils are equal, round, and reactive to light.  Neck: Normal range of motion. Neck supple.  Cardiovascular: Normal rate.   Respiratory: Effort normal.  GI: Soft.  Musculoskeletal: Normal range of motion.  Neurological: She is alert and oriented to person, place, and time.  Tenderness to palpation in the left flank tenderness to percussion slight visual deformity of the thoracolumbar junction secondary to scoliosis  Skin: Skin is warm and dry.    Psychiatric: She has a normal mood and affect. Her behavior is normal. Judgment and thought content normal.     Assessment/Plan Scoliosis thoracolumbar spine status post fixation and fusion L1-L5  Revision of arthrodesis with extension to T8 decompression and fusion of T12-L1.  ELSNER,HENRY J 08/08/2015, 7:35 AM    

## 2015-08-08 NOTE — Transfer of Care (Signed)
Immediate Anesthesia Transfer of Care Note  Patient: [name]  Procedure(s) Performed: Procedure(s) with comments: T8-L1 posterior lateral fusion with decompression T12-L1 (N/A) - T8-L1 posterior lateral fusion with decompression T12-L1  Patient Location: PACU  Anesthesia Type:General  Level of Consciousness: awake, alert , oriented and patient cooperative  Airway & Oxygen Therapy: Patient Spontanous Breathing and Patient connected to nasal cannula oxygen  Post-op Assessment: Report given to RN, Post -op Vital signs reviewed and stable and Patient moving all extremities  Post vital signs: Reviewed and stable  Last Vitals:  Filed Vitals:   08/08/15 0558  BP: 123/37  Pulse: 65  Temp: 36.9 C  Resp: 16    Complications: No apparent anesthesia complications

## 2015-08-08 NOTE — Op Note (Signed)
Date of surgery: 08/08/2015 Preoperative diagnosis: Degenerative scoliosis of the thoracic lumbar spine status post decompression and arthrodesis L1-L5 for lumbar scoliosis. Postoperative diagnosis: Degenerative scoliosis of the thoracic lumbar spine status post decompression arthrodesis L1-L5 for lumbar scoliosis. Procedure: Posterior reduction and stabilization from T8-L1 with pedicle screw fixation and posterior arthrodesis using allograft and infuse T8-L1 Surgeon: Kristeen Miss First assistant: [name] M.D. Anesthesia: Gen. endotracheal Indications: [name] is a 81 year old individual who about 3 years ago had undergone surgical decompression and stabilization from L1-L5 for a progressive degenerative scoliosis. She has done well but now is developed a junctional syndrome at the level of T 12 L1. She's been advised regarding the need for stabilization to the mid thoracic spine from T8-T12. Decompression is also to be performed at the T12-L1 level.  Procedure: The patient was brought to the operating room supine on a stretcher. After the smooth induction of general endotracheal anesthesia in addition to placing a Foley catheter the patient was then turned prone onto the operating table. She is placed on parallel rolls to allow for partial reduction of her thoracic kyphosis. Then the back was prepped with alcohol and DuraPrep and draped in a sterile fashion. A midline incision was created from the upper portion of her previous lumbar incision to the mid thoracic spine. The dissection was carried down to the lumbar dorsal fascia in the thoracic of dorsal fascia which was opened on either side of the midline. Subperiosteal dissection was carried out to expose from T8 down to the L1 vertebrae including the previously placed hardware. This was carefully isolated. Then the interspaces were checked for movement and there was noted to be minimal movement at the T12-L1 level. In fact the facets were so  sclerosed that it was initially felt to be part of the arthrodesis. At this point radiographic confirmation of the patient's anatomic positioning was obtained and was noted that she was moderately reduced at the thoracic oh lumbar junction. At this we then decided to proceed with a pedicle screw placement from T8-T12. 5.5 x 45 mm pedicle screws were placed bilaterally at each vertebrae from T8 down to T12 after decorticating and exposing the transverse process. Entry to the pedicles was through the transverse process at each level and final radiographs confirm placement of 10 pedicle screws in good position. Then side connectors were placed just below the L1 pedicle screws on the previous construct that was done percutaneously. At this time also the posterior spinous processes and lamina were decorticated from T8 down to L1. The side connectors were placed and then 2 rods were contoured to fit between the saddles of all the screws on each ipsilateral side. With this the construct was tightened in a neutral position. Strips of infuse were then laid from L1 up to T8 along with croutons of a bony allograft. Once this was accomplished final radiographs were taken that confirmed reasonable reduction of the thoracolumbar spine and placement of all the hardware. With this the thoracodorsal fascia was closed over the thoracic spine and 20 Vicryls using the 17th tissues and 30 by: Close subcutaneous take her skin. Blood loss is estimated about 200 mL. A dry sterile dressing was applied. Patient tolerated procedure well stretcher and recovery room in stable condition

## 2015-08-09 ENCOUNTER — Encounter (HOSPITAL_COMMUNITY): Payer: Self-pay | Admitting: Neurological Surgery

## 2015-08-09 ENCOUNTER — Other Ambulatory Visit (HOSPITAL_COMMUNITY): Payer: Medicare Other

## 2015-08-09 LAB — GLUCOSE, CAPILLARY
GLUCOSE-CAPILLARY: 113 mg/dL — AB (ref 65–99)
Glucose-Capillary: 135 mg/dL — ABNORMAL HIGH (ref 65–99)
Glucose-Capillary: 105 mg/dL — ABNORMAL HIGH (ref 65–99)
Glucose-Capillary: 134 mg/dL — ABNORMAL HIGH (ref 65–99)

## 2015-08-09 NOTE — Progress Notes (Addendum)
Occupational Therapy Evaluation Patient Details Name: [name] MRN: [id number] DOB: November 16, 1933 Today's Date: 08/09/2015    History of Present Illness Pt is a 81 y.o. female s/p decompression and arthrodesis L1-L5 for degenerative scoliosis of the thoracic lumbar spine. PMH significant for DM, HTN, hyperlipidemia, mitral valve regurgitation and prolapse, arthritis, depression, and GERD.   Clinical Impression   Pt was very impulsive during session and required several verbal and physical cues to wait for assistance and to adhere to back precautions. Pt completed ADL and mobility with min assist and max verbal cues for safety and precautions. Pt would benefit from continued acute OT to increase safety and independence upon d/c. Will continue to follow acutely.    Follow Up Recommendations  No OT follow up;Supervision/Assistance - 24 hour    Equipment Recommendations  Other (comment) (RW-2 wheeled)    Recommendations for Other Services       Precautions / Restrictions Precautions Precautions: Back;Fall Precaution Booklet Issued: Yes (comment) Precaution Comments: Reviewed precautions - pt able to recall 2/3 precautions at end of session Required Braces or Orthoses: Spinal Brace Spinal Brace: Thoracolumbosacral orthotic;Applied in sitting position Restrictions Weight Bearing Restrictions: No      Mobility Bed Mobility Overal bed mobility: Needs Assistance Bed Mobility: Rolling;Sidelying to Sit;Sit to Sidelying Rolling: Min guard Sidelying to sit: Min assist     Sit to sidelying: Min assist General bed mobility comments: Taught pt log roll technique and pt with good return demonstration. Verbal cues for safe hand placement on bedrail min assist to support trunk.  Transfers Overall transfer level: Needs assistance Equipment used: Rolling walker (2 wheeled) Transfers: Sit to/from Stand Sit to Stand: Min assist         General transfer comment: Min assist for boost  to stand. Verbal cues for hand placement on seated surface and safe use of RW. Pt continually picked up RW off ground when hitting obstacles.    Balance Overall balance assessment: Needs assistance Sitting-balance support: No upper extremity supported;Feet supported Sitting balance-Leahy Scale: Fair     Standing balance support: Bilateral upper extremity supported;During functional activity Standing balance-Leahy Scale: Fair                              ADL Overall ADL's : Needs assistance/impaired     Grooming: Wash/dry hands;Wash/dry face;Oral care;Brushing hair;Min guard;Cueing for safety;Cueing for compensatory techniques;Standing Grooming Details (indicate cue type and reason): Verbal cues to adhere to back precautions.          Upper Body Dressing : Maximal assistance;Cueing for sequencing;Sitting Upper Body Dressing Details (indicate cue type and reason): Verbal cues for proper technique to don TLSO     Toilet Transfer: Minimal assistance;Cueing for safety;Ambulation;Regular Toilet;Grab bars;RW   Toileting- Clothing Manipulation and Hygiene: Moderate assistance;Cueing for safety;Cueing for back precautions;Sit to/from stand Toileting - Clothing Manipulation Details (indicate cue type and reason): Verbal cues to avoid bending and twisting.     Functional mobility during ADLs: Min guard;Cueing for safety;Cueing for sequencing;Rolling walker General ADL Comments: Pt is very impulsive and required max verbal cues to slow down and wait for assistance. Pt not demonstrating understanding of precautions during functional tasks and required max verbal cues to avoid movements that would cause her to break precautions. Pt completed able to complete all sink-level grooming tasks with min guard assist for safety due to attempts to bend and twist.  Pt required max assist to don brace for  first time.     Vision Vision Assessment?: No apparent visual deficits   Perception      Praxis      Pertinent Vitals/Pain Pain Assessment: 0-10 Pain Score: 6  Pain Location: back Pain Descriptors / Indicators: Aching;Sore;Operative site guarding Pain Intervention(s): Limited activity within patient's tolerance;Monitored during session;Repositioned;RN gave pain meds during session     Hand Dominance Left   Extremity/Trunk Assessment Upper Extremity Assessment Upper Extremity Assessment: Generalized weakness   Lower Extremity Assessment Lower Extremity Assessment: Defer to PT evaluation   Cervical / Trunk Assessment Cervical / Trunk Assessment: Kyphotic   Communication Communication Communication: No difficulties   Cognition Arousal/Alertness: Awake/alert Behavior During Therapy: Impulsive Overall Cognitive Status: Within Functional Limits for tasks assessed       Memory: Decreased recall of precautions             General Comments       Exercises       Shoulder Instructions      Home Living Family/patient expects to be discharged to:: Private residence Living Arrangements: Spouse/significant other Available Help at Discharge: Family;Available 24 hours/day Type of Home: House Home Access: Stairs to enter [employer] of Steps: 4 Entrance Stairs-Rails: Right Home Layout: Two level;Able to live on main level with bedroom/bathroom     Bathroom Shower/Tub: Tub/shower unit;Curtain Shower/tub characteristics: Architectural technologist: Standard     Home Equipment: Cane - single point;Walker - standard;Shower seat;Grab bars - toilet;Grab bars - tub/shower;Hand held shower head          Prior Functioning/Environment Level of Independence: Independent with assistive device(s)        Comments: Using SPC PTA.    OT Diagnosis: Generalized weakness;Acute pain   OT Problem List: Decreased strength;Decreased range of motion;Decreased activity tolerance;Impaired balance (sitting and/or standing);Decreased coordination;Decreased  safety awareness;Decreased knowledge of precautions;Decreased knowledge of use of DME or AE;Pain   OT Treatment/Interventions: Self-care/ADL training;DME and/or AE instruction;Therapeutic activities;Patient/family education;Balance training    OT Goals(Current goals can be found in the care plan section) Acute Rehab OT Goals Patient Stated Goal: to be independent OT Goal Formulation: With patient Time For Goal Achievement: 08/23/15 Potential to Achieve Goals: Good ADL Goals Pt Will Perform Lower Body Bathing: with supervision;with adaptive equipment;sitting/lateral leans;sit to/from stand Pt Will Perform Lower Body Dressing: with supervision;with caregiver independent in assisting;with adaptive equipment;sitting/lateral leans;sit to/from stand Pt Will Transfer to Toilet: with supervision;ambulating;regular height toilet;grab bars Pt Will Perform Toileting - Clothing Manipulation and hygiene: with supervision;with adaptive equipment;sitting/lateral leans;sit to/from stand Pt Will Perform Tub/Shower Transfer: Tub transfer;with supervision;with caregiver independent in assisting;ambulating;shower seat;grab bars;rolling walker Additional ADL Goal #1: Pt will don/doff TLSO with caregiver independently assisting. Additional ADL Goal #2: Pt will verbalize 3/3 back precautions to promote safe ADL performance.  OT Frequency: Min 3X/week   Barriers to D/C:            Co-evaluation              End of Session Equipment Utilized During Treatment: Gait belt;Rolling walker;Back brace Nurse Communication: Mobility status;Precautions  Activity Tolerance: Patient tolerated treatment well Patient left: in bed;with call bell/phone within reach;with bed alarm set   Time: [id number] OT Time Calculation (min): 35 min Charges:  OT General Charges $OT Visit: 1 Procedure OT Evaluation $Initial OT Evaluation Tier I: 1 Procedure OT Treatments $Self Care/Home Management : 8-22 minutes G-Codes:     Redmond Baseman 08/23/2015, 10:03 AM

## 2015-08-09 NOTE — Care Management Note (Signed)
Case Management Note  Patient Details  Name: [name] MRN: [id number] Date of Birth: 04-Jan-1934  Subjective/Objective:                    Action/Plan: Patient was admitted for a lumbar fusion. Lives at home with spouse. Will follow for discharge needs pending PT/OT evals and physician orders.  Expected Discharge Date:                  Expected Discharge Plan:     In-House Referral:     Discharge planning Services     Post Acute Care Choice:    Choice offered to:     DME Arranged:    DME Agency:     HH Arranged:    HH Agency:     Status of Service:  In process, will continue to follow  Medicare Important Message Given:    Date Medicare IM Given:    Medicare IM give by:    Date Additional Medicare IM Given:    Additional Medicare Important Message give by:     If discussed at [place] of Stay Meetings, dates discussed:    Additional Comments:  Rolm Baptise, RN 08/09/2015, 10:57 AM

## 2015-08-09 NOTE — Evaluation (Signed)
Physical Therapy Evaluation Patient Details Name: [name] MRN: [id number] DOB: 08-09-1934 Today's Date: 08/09/2015   History of Present Illness  Pt is a 81 y.o. female s/p decompression and arthrodesis T8-L1 for degenerative scoliosis of the thoracic lumbar spine. PMH significant for DM, HTN, hyperlipidemia, mitral valve regurgitation and prolapse, arthritis, depression, and GERD.    Clinical Impression  Patient is s/p above surgery resulting in the deficits listed below (see PT Problem List). Patient is impulsive and not following instructions to protect back at times (?partially due to [facility]). Patient will benefit from skilled PT to increase their independence and safety with mobility (while adhering to their precautions) to allow discharge to the venue listed below.     Follow Up Recommendations Home health PT;Supervision for mobility/OOB    Equipment Recommendations  Rolling walker with 5" wheels    Recommendations for Other Services       Precautions / Restrictions Precautions Precautions: Back;Fall Precaution Booklet Issued: Yes (comment) Precaution Comments: Reviewed precautions - pt able to recall 2/3 precautions at end of session Required Braces or Orthoses: Spinal Brace Spinal Brace: Thoracolumbosacral orthotic;Applied in sitting position Restrictions Weight Bearing Restrictions: No      Mobility  Bed Mobility Overal bed mobility: Needs Assistance Bed Mobility: Rolling;Sidelying to Sit Rolling: Supervision Sidelying to sit: Min guard     Sit to sidelying: Min assist General bed mobility comments: Pt utilized rail; recalled log roll technique without cues  Transfers Overall transfer level: Needs assistance Equipment used: Rolling walker (2 wheeled) Transfers: Sit to/from Stand Sit to Stand: Min guard         General transfer comment: Despite cues to stay seated, pt stood from EOB once brace applied.  Ambulation/Gait Ambulation/Gait assistance: Min  assist Ambulation Distance (Feet): 90 Feet Assistive device: Rolling walker (2 wheeled) Gait Pattern/deviations: Step-through pattern;Decreased stride length;Trunk flexed (upper trunk)     General Gait Details: Required near constant verbal and frequent tactile cues for upright posture. Vc for safe use of RW  Stairs            Wheelchair Mobility    Modified Rankin (Stroke Patients Only)       Balance Overall balance assessment: Needs assistance Sitting-balance support: No upper extremity supported;Feet supported Sitting balance-Leahy Scale: Fair     Standing balance support: Bilateral upper extremity supported Standing balance-Leahy Scale: Poor Standing balance comment: attempted to let go of RW with one arm to don robe with LOB and min assist                             Pertinent Vitals/Pain Pain Assessment: 0-10 Pain Score: 5  Pain Location: back Pain Descriptors / Indicators: Operative site guarding Pain Intervention(s): Limited activity within patient's tolerance;Monitored during session;Premedicated before session;Repositioned;Other (comment) (reinforced back precautions throughout session)    [place] expects to be discharged to:: Private residence Living Arrangements: Spouse/significant other Available Help at Discharge: Family;Available 24 hours/day Type of Home: House Home Access: Stairs to enter Entrance Stairs-Rails: Right Entrance Stairs-Number of Steps: 4 Home Layout: One level Home Equipment: Cane - single point;Walker - standard;Shower seat;Grab bars - toilet;Grab bars - tub/shower;Hand held shower head      Prior Function Level of Independence: Independent with assistive device(s)         Comments: Using SPC PTA.     Hand Dominance   Dominant Hand: Left    Extremity/Trunk Assessment   Upper Extremity Assessment:  Defer to OT evaluation           Lower Extremity Assessment: Generalized weakness       Cervical / Trunk Assessment: Kyphotic  Communication   Communication: HOH (deaf Lt ear)  Cognition Arousal/Alertness: Awake/alert Behavior During Therapy: Impulsive Overall Cognitive Status: Within Functional Limits for tasks assessed       Memory: Decreased recall of precautions              General Comments General comments (skin integrity, edema, etc.): Pt impulsive and moves/does when she wants to. Much encouragement to sit up in chair    Exercises        Assessment/Plan    PT Assessment Patient needs continued PT services  PT Diagnosis Difficulty walking;Acute pain   PT Problem List Decreased strength;Decreased balance;Decreased mobility;Decreased knowledge of use of DME;Decreased knowledge of precautions;Pain  PT Treatment Interventions DME instruction;Gait training;Stair training;Functional mobility training;Therapeutic activities;Patient/family education   PT Goals (Current goals can be found in the Care Plan section) Acute Rehab PT Goals Patient Stated Goal: to be independent PT Goal Formulation: With patient Time For Goal Achievement: 08/16/15 Potential to Achieve Goals: Good    Frequency Min 5X/week   Barriers to discharge        Co-evaluation               End of Session Equipment Utilized During Treatment: Gait belt;Back brace Activity Tolerance: Patient tolerated treatment well Patient left: in chair;with call bell/phone within reach;with chair alarm set;with nursing/sitter in room Nurse Communication: Mobility status         Time: [id number] PT Time Calculation (min) (ACUTE ONLY): 28 min   Charges:   PT Evaluation $Initial PT Evaluation Tier I: 1 Procedure PT Treatments $Gait Training: 8-22 mins   PT G Codes:        [name] 2015/08/28, 11:46 AM Pager [phone]

## 2015-08-09 NOTE — Progress Notes (Signed)
Patient ID: [name], female   DOB: April 15, 1934, 81 y.o.   MRN: [id number] Vital signs are stable Motor function is intact Back pain is moderate Ambulating slowly with help today Foley catheter is out Stable

## 2015-08-10 ENCOUNTER — Other Ambulatory Visit: Payer: Self-pay | Admitting: Family Medicine

## 2015-08-10 ENCOUNTER — Inpatient Hospital Stay (HOSPITAL_COMMUNITY): Payer: Medicare Other

## 2015-08-10 LAB — GLUCOSE, CAPILLARY
GLUCOSE-CAPILLARY: 117 mg/dL — AB (ref 65–99)
GLUCOSE-CAPILLARY: 113 mg/dL — AB (ref 65–99)
GLUCOSE-CAPILLARY: 136 mg/dL — AB (ref 65–99)
Glucose-Capillary: 151 mg/dL — ABNORMAL HIGH (ref 65–99)
Glucose-Capillary: 143 mg/dL — ABNORMAL HIGH (ref 65–99)

## 2015-08-10 MED ORDER — GLUCERNA SHAKE PO LIQD
237.0000 mL | Freq: Two times a day (BID) | ORAL | Status: DC
  Filled 2015-08-10 (×7): qty 237

## 2015-08-10 MED ORDER — KETOROLAC TROMETHAMINE 15 MG/ML IJ SOLN
15.0000 mg | Freq: Four times a day (QID) | INTRAMUSCULAR | Status: AC
  Administered 2015-08-10 – 2015-08-11 (×5): 15 mg via INTRAVENOUS
  Filled 2015-08-10 (×4): qty 1

## 2015-08-10 MED ORDER — BISACODYL 5 MG PO TBEC: 10.0000 mg | DELAYED_RELEASE_TABLET | Freq: Every day | ORAL | Status: DC | PRN

## 2015-08-10 MED FILL — Sodium Chloride IV Soln 0.9%: INTRAVENOUS | Qty: 1000 | Status: AC

## 2015-08-10 MED FILL — Heparin Sodium (Porcine) Inj 1000 Unit/ML: INTRAMUSCULAR | Qty: 30 | Status: AC

## 2015-08-10 NOTE — Progress Notes (Signed)
Physical Therapy Treatment Patient Details Name: [name] MRN: [id number] DOB: 1934-03-23 Today's Date: 08/10/2015    History of Present Illness Pt is a 81 y.o. female s/p decompression and arthrodesis T8-L1 for degenerative scoliosis of the thoracic lumbar spine. PMH significant for DM, HTN, hyperlipidemia, mitral valve regurgitation and prolapse, arthritis, depression, and GERD.    PT Comments    Patient thankful for coordination with nursing for pain medicine and agreeable to walk. Continues to need max cues to maintain upright posture. Husband present for education. Patient refused sitting up in chair and returned to bed. Noted plans for f/u xrays. Plan stair training 12/1.   Follow Up Recommendations  Home health PT;Supervision for mobility/OOB     Equipment Recommendations  Rolling walker with 5" wheels    Recommendations for Other Services       Precautions / Restrictions Precautions Precautions: Back;Fall Precaution Comments: Reviewed precautions - pt able to recall 3/3  Required Braces or Orthoses: Spinal Brace Spinal Brace: Thoracolumbosacral orthotic;Applied in sitting position (hsuband present and completed with vc and assist) Restrictions Weight Bearing Restrictions: No    Mobility  Bed Mobility Overal bed mobility: Needs Assistance Bed Mobility: Rolling;Sidelying to Sit;Sit to Sidelying Rolling: Supervision Sidelying to sit: Supervision     Sit to sidelying: Min guard General bed mobility comments: Pt utilized rail; recalled log roll technique without cues; nearly needed assist to raise legs onto bed  Transfers Overall transfer level: Needs assistance Equipment used: Rolling walker (2 wheeled) Transfers: Sit to/from Stand Sit to Stand: Min guard         General transfer comment: vc for safe use of RW  Ambulation/Gait Ambulation/Gait assistance: Min assist Ambulation Distance (Feet): 150 Feet Assistive device: Rolling walker (2  wheeled) Gait Pattern/deviations: Step-through pattern;Decreased stride length;Trunk flexed   Gait velocity interpretation: Below normal speed for age/gender General Gait Details: Required near constant verbal and frequent tactile cues for upright posture. Vc for safe use of RW   Stairs Stairs:  (pt requesting to practice 12/1 and d/c home)          Wheelchair Mobility    Modified Rankin (Stroke Patients Only)       Balance     Sitting balance-Leahy Scale: Fair       Standing balance-Leahy Scale: Poor                      Cognition Arousal/Alertness: Awake/alert Behavior During Therapy: Impulsive Overall Cognitive Status: Within Functional Limits for tasks assessed                      Exercises      General Comments        Pertinent Vitals/Pain Pain Assessment: 0-10 Pain Score: 6  Pain Location: back Pain Descriptors / Indicators: Operative site guarding Pain Intervention(s): Limited activity within patient's tolerance;Monitored during session;Premedicated before session;Repositioned    Home Living                      Prior Function            PT Goals (current goals can now be found in the care plan section) Acute Rehab PT Goals Patient Stated Goal: to be independent Time For Goal Achievement: 08/16/15 Progress towards PT goals: Progressing toward goals    Frequency  Min 5X/week    PT Plan Current plan remains appropriate    Co-evaluation  End of Session Equipment Utilized During Treatment: Gait belt;Back brace Activity Tolerance: Patient tolerated treatment well Patient left: with call bell/phone within reach;in bed;with bed alarm set;with family/visitor present     Time: [id number] PT Time Calculation (min) (ACUTE ONLY): 21 min  Charges:  $Gait Training: 8-22 mins                    G Codes:      [name] Aug 18, 2015, 12:09 PM Pager [phone]

## 2015-08-10 NOTE — Progress Notes (Signed)
Occupational Therapy Treatment Patient Details Name: [name] MRN: [id number] DOB: Jul 03, 1934 Today's Date: 08/10/2015    History of present illness Pt is a 81 y.o. female s/p decompression and arthrodesis T8-L1 for degenerative scoliosis of the thoracic lumbar spine. PMH significant for DM, HTN, hyperlipidemia, mitral valve regurgitation and prolapse, arthritis, depression, and GERD.   OT comments  Pt. Initially declines skilled OT, but was agreeable to bed mobility, and eob with focus of donning/doffing brace while maintaining back precautions and keeping a safe pace during functional activity.    Follow Up Recommendations  No OT follow up;Supervision/Assistance - 24 hour    Equipment Recommendations       Recommendations for Other Services      Precautions / Restrictions Precautions Precautions: Back;Fall Precaution Comments: reviewed precautions, pt. able to recall 3/3 precautions at start of session with no cues required Required Braces or Orthoses: Spinal Brace Spinal Brace: Thoracolumbosacral orthotic;Applied in sitting position       Mobility Bed Mobility Overal bed mobility: Needs Assistance Bed Mobility: Rolling;Sidelying to Sit;Sit to Sidelying Rolling: Supervision Sidelying to sit: Min guard     Sit to sidelying: Supervision General bed mobility comments: hob flat, no rail, exits on right side of bed at home. cga to initiate push from sidelying position but able to finish the transition without assistance.  able to transition from sit to sidelying to supine with S.    Transfers                 General transfer comment: declined oob, but did perform 3 scoots towards hob in preparation for back to bed with s    Balance                                   ADL Overall ADL's : Needs assistance/impaired                 Upper Body Dressing : Maximal assistance;Cueing for sequencing;Sitting Upper Body Dressing Details (indicate cue  type and reason): donning brace                   General ADL Comments: educated pt. at beginning of session that goal was to SLOW down and keep safe pace.  reviewed that if she is moving fast she is unable to maintain back precautions and can hurt herself.  agreeable to donning brace while seated eob but declined all oob and further adl activity      Vision                     Perception     Praxis      Cognition   Behavior During Therapy: [facility] for tasks assessed/performed Overall Cognitive Status: Within Functional Limits for tasks assessed                       Extremity/Trunk Assessment               Exercises     Shoulder Instructions       General Comments      Pertinent Vitals/ Pain       Pain Assessment:  (did not rate but c/o pain with sitting)  Home Living  Prior Functioning/Environment              Frequency Min 3X/week     Progress Toward Goals  OT Goals(current goals can now be found in the care plan section)  Progress towards OT goals: Progressing toward goals     Plan Discharge plan remains appropriate    Co-evaluation                 End of Session Equipment Utilized During Treatment: Back brace   Activity Tolerance Patient tolerated treatment well   Patient Left in bed;with call bell/phone within reach;with bed alarm set   Nurse Communication Other (comment) (pt. initially declined therapy due to pending x-ray, rn states ok for therapy no holds at this time)        Time: 1017-1030 OT Time Calculation (min): 13 min  Charges: OT General Charges $OT Visit: 1 Procedure OT Treatments $Self Care/Home Management : 8-22 mins  Janice Coffin, COTA/L 08/10/2015, 10:47 AM

## 2015-08-10 NOTE — Progress Notes (Signed)
Subjective: Patient reports Patient complains of pain throughout the back centrally mostly  Objective: Vital signs in last 24 hours: Temp:  [97.7 F (36.5 C)-100.1 F (37.8 C)] 99.1 F (37.3 C) (11/30 0525) Pulse Rate:  [74-84] 84 (11/30 0525) Resp:  [16-18] 17 (11/30 0525) BP: (89-126)/(40-58) 118/58 mmHg (11/30 0525) SpO2:  [90 %-97 %] 93 % (11/30 0525)  Intake/Output from previous day:   Intake/Output this shift:    Incisions clean and dry motor function appears intact in lower extremities  Lab Results: No results for input(s): WBC, HGB, HCT, PLT in the last 72 hours. BMET No results for input(s): NA, K, CL, CO2, GLUCOSE, BUN, CREATININE, CALCIUM in the last 72 hours.  Studies/Results: Dg Thoracolumabar Spine  08/08/2015  CLINICAL DATA:  81 year old female undergoing thoracolumbar surgery. Initial encounter. EXAM: DG C-ARM 61-120 MIN; THORACOLUMBAR SPINE - 2 VIEW COMPARISON:  Intraoperative radiograph 0820 hours today and earlier. FINDINGS: Four intraoperative fluoroscopic views of the lower thoracic spine in the AP and lateral projections. Multilevel lower thoracic transpedicular hardware placement with connecting rods. Vacuum disc at T12-L1 re- demonstrated. IMPRESSION: T8 through T12 transpedicular hardware and connecting rods placed. Electronically Signed   By: Genevie Ann M.D.   On: 08/08/2015 11:31   Dg C-arm 61-120 Min  08/08/2015  CLINICAL DATA:  81 year old female undergoing thoracolumbar surgery. Initial encounter. EXAM: DG C-ARM 61-120 MIN; THORACOLUMBAR SPINE - 2 VIEW COMPARISON:  Intraoperative radiograph 0820 hours today and earlier. FINDINGS: Four intraoperative fluoroscopic views of the lower thoracic spine in the AP and lateral projections. Multilevel lower thoracic transpedicular hardware placement with connecting rods. Vacuum disc at T12-L1 re- demonstrated. IMPRESSION: T8 through T12 transpedicular hardware and connecting rods placed. Electronically Signed   By: Genevie Ann M.D.   On: 08/08/2015 11:31    Assessment/Plan: Stable postop.  LOS: 2 days  Will obtain postoperative films add another round of Toradol.   Chason Mciver J 08/10/2015, 9:14 AM

## 2015-08-10 NOTE — Progress Notes (Signed)
Initial Nutrition Assessment   INTERVENTION:  Provide Glucerna Shake po BID, each supplement provides 220 kcal and 10 grams of protein Provide snacks BID  NUTRITION DIAGNOSIS:   Inadequate oral intake related to poor appetite, altered GI function as evidenced by per patient/family report, meal completion < 25%.   GOAL:   Patient will meet greater than or equal to 90% of their needs   MONITOR:   PO intake, Supplement acceptance, Labs, Weight trends, Skin  REASON FOR ASSESSMENT:   Malnutrition Screening Tool    ASSESSMENT:   81 y.o. female s/p decompression and arthrodesis L1-L5 for degenerative scoliosis of the thoracic lumbar spine. PMH significant for DM, HTN, hyperlipidemia, mitral valve regurgitation and prolapse, arthritis, depression, and GERD.  Pt states that her appetite is poor due to constipation and she only ate a few bites of yogurt for breakfast. She reports eating well PTA with a usual body weight of 131 lbs. She takes dulcolax as needed for constipation. RD emphasized the importance of nutrition, especially after surgery, and discussed diet tips to help prevent constipation. Pt agreeable to receiving snacks and trying Glucerna Shakes. Pt has some mild muscle wasting per nutrition-focused physical exam. Weight is stable.  Labs reviewed.   Diet Order:  Diet Carb Modified Fluid consistency:: Thin; Room service appropriate?: Yes  Skin:  Wound (see comment) (closed back incision)  Last BM:  11/27  Height:   Ht Readings from Last 1 Encounters:  08/08/15 5\' 6"  (1.676 m)    Weight:   Wt Readings from Last 1 Encounters:  08/08/15 130 lb 15.3 oz (59.4 kg)    Ideal Body Weight:  59.1 kg  BMI:  Body mass index is 21.15 kg/(m^2).  Estimated Nutritional Needs:   Kcal:  1500-1700  Protein:  70-80 grams  Fluid:  1.5-1.7 L/day  EDUCATION NEEDS:   No education needs identified at this time  [place], LDN Inpatient Clinical Dietitian Pager:  [phone] After Hours Pager: [phone]

## 2015-08-11 ENCOUNTER — Encounter (HOSPITAL_COMMUNITY): Payer: Self-pay | Admitting: Neurological Surgery

## 2015-08-11 LAB — GLUCOSE, CAPILLARY
GLUCOSE-CAPILLARY: 114 mg/dL — AB (ref 65–99)
Glucose-Capillary: 112 mg/dL — ABNORMAL HIGH (ref 65–99)
Glucose-Capillary: 172 mg/dL — ABNORMAL HIGH (ref 65–99)
Glucose-Capillary: 169 mg/dL — ABNORMAL HIGH (ref 65–99)

## 2015-08-11 MED ORDER — MAGNESIUM CITRATE PO SOLN
1.0000 | Freq: Once | ORAL | Status: AC
  Administered 2015-08-11: 1 via ORAL
  Filled 2015-08-11: qty 296

## 2015-08-11 MED ORDER — BISACODYL 10 MG RE SUPP
10.0000 mg | Freq: Every day | RECTAL | Status: DC | PRN
  Administered 2015-08-11: 10 mg via RECTAL
  Filled 2015-08-11: qty 1

## 2015-08-11 MED ORDER — DEXAMETHASONE 4 MG PO TABS
4.0000 mg | ORAL_TABLET | Freq: Every day | ORAL | Status: DC
  Administered 2015-08-11 – 2015-08-12 (×2): 4 mg via ORAL
  Filled 2015-08-11 (×2): qty 1

## 2015-08-11 NOTE — Progress Notes (Signed)
PT Cancellation Note  Patient Details Name: MALESHA BENVENUTO MRN: [id number] DOB: 07-09-1934   Cancelled Treatment:    Reason Eval/Treat Not Completed: Discussed pt case with RN who asks that PT see pt soon due to pt getting laxative. Pt declining therapy, states she will not work with PT at this time. Therapist explained that timing may be good at this time as laxative does not work immediately, and pt continued to decline. Will check back as schedule allows to progress mobility. Pt will need to practice the stairs prior to d/c.    Rolinda Roan 08/11/2015, 11:49 AM

## 2015-08-11 NOTE — Care Management Important Message (Signed)
Important Message  Patient Details  Name: [name] MRN: [id number] Date of Birth: 05/03/1934   Medicare Important Message Given:  Yes    Shirleen Mcfaul P [place] 08/11/2015, 1:24 PM

## 2015-08-11 NOTE — Progress Notes (Signed)
Occupational Therapy Treatment Patient Details Name: [name] MRN: [id number] DOB: 05-Apr-1934 Today's Date: 08/11/2015    History of present illness Pt is a 81 y.o. female s/p T8-L1 posterior lateral fusion with decompression T12-L1 for degenerative scoliosis of the thoracic lumbar spine. PMH significant for DM, HTN, hyperlipidemia, mitral valve regurgitation and prolapse, arthritis, depression, and GERD.   OT comments  Pt initially declined to participate in therapy due to increased abdominal pain. However, pt needed to go to the bathroom and OT assisted. Husband donned/doffed pt's TLSO at the EOB independently. Pt still very impulsive and required max verbal cues to adhere to back precautions. Pt was min guard for ambulation and min assist for transfers. Will continue to follow acutely to address OT needs and goals.    Follow Up Recommendations  No OT follow up;Supervision/Assistance - 24 hour    Equipment Recommendations  Other (comment) (RW 2-wheeled)    Recommendations for Other Services      Precautions / Restrictions Precautions Precautions: Back;Fall Required Braces or Orthoses: Spinal Brace Spinal Brace: Thoracolumbosacral orthotic;Applied in sitting position Restrictions Weight Bearing Restrictions: No       Mobility Bed Mobility Overal bed mobility: Needs Assistance Bed Mobility: Rolling;Sidelying to Sit;Sit to Sidelying Rolling: Supervision Sidelying to sit: Mod assist     Sit to sidelying: Min assist General bed mobility comments: HOB slighlty elevated, use of bed rail. Good demonstration of log roll technique. Mod assist to support and elevate trunk to come to sitting. Min assist to support trunk on descent to lay supine.   Transfers Overall transfer level: Needs assistance Equipment used: Rolling walker (2 wheeled) Transfers: Sit to/from Stand Sit to Stand: Min assist         General transfer comment: Min assist for boost to stand. Verbal cues for  safe hand placement on seated surfaces and on RW.    Balance Overall balance assessment: Needs assistance Sitting-balance support: No upper extremity supported;Feet supported Sitting balance-Leahy Scale: Fair     Standing balance support: Bilateral upper extremity supported;During functional activity Standing balance-Leahy Scale: Poor                     ADL Overall ADL's : Needs assistance/impaired                 Upper Body Dressing : Maximal assistance;With caregiver independent assisting;Sitting       Toilet Transfer: Minimal assistance;Ambulation;Regular Toilet;Grab bars;RW;Cueing for sequencing Toilet Transfer Details (indicate cue type and reason): Verbal cues to adhere to back precautions Toileting- Clothing Manipulation and Hygiene: Min guard;Sitting/lateral lean;Adhering to back precautions       Functional mobility during ADLs: Min guard;Rolling walker General ADL Comments: Initially pt refused to participate in therapy, but needed to go to the bathroom, Pt's husband independently donned/doffed pt's TLSO brace. Pt was min guard to ambulate to and from bathroom and required min assist for slow descent onto commode. Pt still very impulsive and impatient and required max verbal cues to slow down and for awareness of safety and precautions. Pt unwilling to participate in anymore therapy due to increased abdominal pain.      Vision                     Perception     Praxis      Cognition   Behavior During Therapy: Agitated;Impulsive Overall Cognitive Status: Within Functional Limits for tasks assessed       Memory: Decreased recall of  precautions               Extremity/Trunk Assessment               Exercises     Shoulder Instructions       General Comments      Pertinent Vitals/ Pain       Pain Assessment: 0-10 Pain Score: 8  Pain Location: stomach Pain Descriptors / Indicators: Cramping;Constant Pain  Intervention(s): Limited activity within patient's tolerance;Repositioned;Monitored during session  Home Living                                          Prior Functioning/Environment              Frequency Min 3X/week     Progress Toward Goals  OT Goals(current goals can now be found in the care plan section)  Progress towards OT goals: Progressing toward goals  Acute Rehab OT Goals Patient Stated Goal: to be independent OT Goal Formulation: With patient Time For Goal Achievement: 08/23/15 Potential to Achieve Goals: Good ADL Goals Pt Will Perform Lower Body Bathing: with supervision;with adaptive equipment;sitting/lateral leans;sit to/from stand Pt Will Perform Lower Body Dressing: with supervision;with caregiver independent in assisting;with adaptive equipment;sitting/lateral leans;sit to/from stand Pt Will Transfer to Toilet: with supervision;ambulating;regular height toilet;grab bars Pt Will Perform Toileting - Clothing Manipulation and hygiene: with supervision;with adaptive equipment;sitting/lateral leans;sit to/from stand Pt Will Perform Tub/Shower Transfer: Tub transfer;with supervision;with caregiver independent in assisting;ambulating;shower seat;grab bars;rolling walker Additional ADL Goal #1: Pt will don/doff TLSO with caregiver independently assisting. Additional ADL Goal #2: Pt will verbalize 3/3 back precautions to promote safe ADL performance.  Plan Discharge plan remains appropriate    Co-evaluation                 End of Session Equipment Utilized During Treatment: Gait belt;Rolling walker;Back brace   Activity Tolerance Patient limited by pain   Patient Left in bed;with call bell/phone within reach;with bed alarm set;with family/visitor present   Nurse Communication Mobility status;Precautions        Time: [id number] OT Time Calculation (min): 12 min  Charges: OT General Charges $OT Visit: 1 Procedure OT Treatments $Self  Care/Home Management : 8-22 mins  Redmond Baseman, OTR/L 08/11/2015, 4:27 PM

## 2015-08-11 NOTE — Progress Notes (Addendum)
Patient ID: [name], female   DOB: 1933-12-21, 81 y.o.   MRN: [id number] vital signs stable  motor function intact Neuro ok Constipation is major issue Xray yesterday looks good. Will allow shower and encourage mobilization in addition to above and below laxitive Plan DC tomorrow.  Wil also give dose of decdron.

## 2015-08-12 LAB — GLUCOSE, CAPILLARY: GLUCOSE-CAPILLARY: 158 mg/dL — AB (ref 65–99)

## 2015-08-12 MED ORDER — OXYCODONE-ACETAMINOPHEN 5-325 MG PO TABS: 1.0000 | ORAL_TABLET | ORAL | Status: DC | PRN

## 2015-08-12 MED ORDER — DEXAMETHASONE 1 MG PO TABS: ORAL_TABLET | ORAL | Status: DC

## 2015-08-12 MED ORDER — METHOCARBAMOL 500 MG PO TABS: 500.0000 mg | ORAL_TABLET | Freq: Four times a day (QID) | ORAL | Status: DC | PRN

## 2015-08-12 NOTE — Progress Notes (Signed)
Physical Therapy Treatment and Discharge Patient Details Name: [name] MRN: [id number] DOB: [birth year] Today's Date: 08/12/2015    History of Present Illness Pt is a 81 y.o. female s/p decompression and arthrodesis T8-L1 for degenerative scoliosis of the thoracic lumbar spine. PMH significant for DM, HTN, hyperlipidemia, mitral valve regurgitation and prolapse, arthritis, depression, and GERD.    PT Comments    Patient eager to go home today. Husband present and at completion of session they had no further questions. From a PT perspective, pt is OK for discharge home as all goals have been met.  Follow Up Recommendations  Supervision for mobility/OOB     Equipment Recommendations  None recommended by PT (Owns 2 wheeled walker)    Recommendations for Other Services       Precautions / Restrictions Precautions Precautions: Back;Fall Precaution Comments: Reviewed precautions - pt able to recall 3/3  Required Braces or Orthoses: Spinal Brace Spinal Brace: Thoracolumbosacral orthotic;Applied in sitting position Restrictions Weight Bearing Restrictions: No    Mobility  Bed Mobility Overal bed mobility: Modified Independent Bed Mobility: Rolling;Sidelying to Sit;Sit to Sidelying Rolling: Modified independent (Device/Increase time) Sidelying to sit: Modified independent (Device/Increase time)     Sit to sidelying: Modified independent (Device/Increase time) General bed mobility comments: Pt utilized rail; recalled log roll technique without cues  Transfers Overall transfer level: Needs assistance Equipment used: Rolling walker (2 wheeled) Transfers: Sit to/from Stand Sit to Stand: Supervision         General transfer comment: no cues needed  Ambulation/Gait Ambulation/Gait assistance: Supervision Ambulation Distance (Feet): 150 Feet Assistive device: Rolling walker (2 wheeled) Gait Pattern/deviations: Step-through pattern;Decreased stride length;Trunk flexed    Gait velocity interpretation: Below normal speed for age/gender General Gait Details: Required intermittent verbal and tactile cues for upright posture.    Stairs Stairs: Yes Stairs assistance: Min assist Stair Management: One rail Right;Step to pattern;Forwards Number of Stairs: 5 General stair comments: HHA on Lt (states this is how her husband always helps her)  Wheelchair Mobility    Modified Rankin (Stroke Patients Only)       Balance                                    Cognition Arousal/Alertness: Awake/alert Behavior During Therapy: Impulsive (less so) Overall Cognitive Status: Within Functional Limits for tasks assessed                      Exercises      General Comments General comments (skin integrity, edema, etc.): Husband present. Denied questions      Pertinent Vitals/Pain Pain Assessment: 0-10 Pain Score: 4  Pain Location: back Pain Descriptors / Indicators: Operative site guarding Pain Intervention(s): Limited activity within patient's tolerance;Monitored during session;Premedicated before session;Repositioned    Home Living               Home Equipment: Cane - single point;Walker - standard;Shower seat;Grab bars - toilet;Grab bars - tub/shower;Hand held shower head;Walker - 2 wheels      Prior Function            PT Goals (current goals can now be found in the care plan section) Acute Rehab PT Goals Patient Stated Goal: to be independent Time For Goal Achievement: 08/16/15 Progress towards PT goals: Goals met/education completed, patient discharged from PT    Frequency       PT Plan Discharge plan  needs to be updated;Equipment recommendations need to be updated    Co-evaluation             End of Session Equipment Utilized During Treatment: Gait belt;Back brace Activity Tolerance: Patient tolerated treatment well Patient left: with call bell/phone within reach;in bed;with family/visitor present      Time: 9169-4503 PT Time Calculation (min) (ACUTE ONLY): 16 min  Charges:  $Gait Training: 8-22 mins                    G Codes:      Kashish Yglesias 2015/08/21, 9:52 AM Pager [phone]

## 2015-08-12 NOTE — Discharge Summary (Signed)
Physician Discharge Summary  Patient ID: [name] MRN: [id number] DOB/AGE: [birth year] 81 y.o.  Admit date: 08/08/2015 Discharge date: 08/12/2015  Admission Diagnoses: Thoracic lumbar scoliosis status post decompression and fusion L1-L5  Discharge Diagnoses: Thoracolumbar scoliosis status post decompression L1-L5 with fusion. Diabetes mellitus. Constipation.  Active Problems:   Scoliosis/kyphoscoliosis   Discharged Condition: good  Hospital Course: Patient was admitted to undergo surgical stabilization from T8-L1 having had a junctional scoliosis above previous decompression from L1-L5 for lumbar scoliosis. She tolerated surgery well. She's had problems with some postoperative pain and constipation but this is resolved at this time.  Consults: None  Significant Diagnostic Studies: None  Treatments: surgery: Posterior fixation and fusion from T8-L1. Arthrodesis with allograft  Discharge Exam: Blood pressure 108/56, pulse 64, temperature 98 F (36.7 C), temperature source Oral, resp. rate 18, height 5\' 6"  (1.676 m), weight 59.4 kg (130 lb 15.3 oz), SpO2 94 %. Incision is clean and dry. Station and gait are intact.  Disposition: 01-Home or Self Care  Discharge Instructions    Call MD for:  redness, tenderness, or signs of infection (pain, swelling, redness, odor or green/yellow discharge around incision site)    Complete by:  As directed      Call MD for:  severe uncontrolled pain    Complete by:  As directed      Call MD for:  temperature >100.4    Complete by:  As directed      Diet - low sodium heart healthy    Complete by:  As directed      Discharge instructions    Complete by:  As directed   Okay to shower. Do not apply salves or appointments to incision. No heavy lifting with the upper extremities greater than 15 pounds. May resume driving when not requiring pain medication and patient feels comfortable with doing so.     Increase activity slowly    Complete by:  As  directed             Medication List    TAKE these medications        bisacodyl 5 MG EC tablet  Commonly known as:  DULCOLAX  Take 10-15 mg by mouth daily as needed for mild constipation.     ciprofloxacin 500 MG tablet  Commonly known as:  CIPRO  Take 1 tablet (500 mg total) by mouth 2 (two) times daily.     citalopram 20 MG tablet  Commonly known as:  CELEXA  Take 1 tablet by mouth  daily     clonazePAM 1 MG tablet  Commonly known as:  KLONOPIN  TAKE 1/2 TABLET BY MOUTH EVERY NIGHT AT BEDTIME AS DIRECTED     dexamethasone 1 MG tablet  Commonly known as:  DECADRON  2 tablets twice daily for 2 days, one tablet twice daily for 2 days, one tablet daily for 2 days. Start this medication on 12/3     diltiazem 240 MG 24 hr capsule  Commonly known as:  DILACOR XR  Take 1 capsule (240 mg total) by mouth daily.     diphenhydrAMINE 25 mg capsule  Commonly known as:  BENADRYL  Take 25 mg by mouth daily as needed for allergies (Takes only during allergy seasons).     fluticasone 50 MCG/ACT nasal spray  Commonly known as:  FLONASE  SHAKE LIQUID AND USE 2 SPRAYS IN EACH NOSTRIL DAILY AS NEEDED FOR ALLERGIES     HYDROcodone-acetaminophen 5-325 MG tablet  Commonly known as:  NORCO/VICODIN  Take 1-2 tablets by mouth every 4 (four) hours as needed (mild pain).     ibuprofen 200 MG tablet  Commonly known as:  ADVIL,MOTRIN  Take 400 mg by mouth every 6 (six) hours as needed for mild pain.     lisinopril-hydrochlorothiazide 10-12.5 MG tablet  Commonly known as:  PRINZIDE,ZESTORETIC  Take 1 tablet by mouth daily.     metFORMIN 500 MG tablet  Commonly known as:  GLUCOPHAGE  Take 2 tablets by mouth  every morning and 1 tablet  by mouth before supper     methocarbamol 500 MG tablet  Commonly known as:  ROBAXIN  Take 1 tablet (500 mg total) by mouth every 6 (six) hours as needed for muscle spasms.     ONETOUCH DELICA LANCETS 99991111 Misc  TEST TWICE DAILY AS DIRECTED     ONETOUCH  VERIO test strip  Generic drug:  glucose blood  CHECK BLOOD GLUCOSE TWICE DAILY AS DIRECTED     oxyCODONE-acetaminophen 5-325 MG tablet  Commonly known as:  PERCOCET/ROXICET  Take 1-2 tablets by mouth every 4 (four) hours as needed for moderate pain.     PROBIOTIC DAILY PO  Take 1 capsule by mouth daily.     SM CRANBERRY 300 MG tablet  Generic drug:  Cranberry  Take 300 mg by mouth every morning.     Vitamin D 1000 UNITS capsule  Take 1,000 Units by mouth daily.         SignedEarleen Newport 08/12/2015, 10:40 AM

## 2015-08-12 NOTE — Progress Notes (Signed)
Discharge orders received.  Discharge instructions and follow-up appointments reviewed with the patient and her husband.  VSS upon discharge.  IV removed and education complete.  All belongings sent with the patient.  Transported out via wheelchair.   Maysen Bonsignore M, RN 

## 2015-08-15 ENCOUNTER — Ambulatory Visit: Payer: Medicare Other | Admitting: Internal Medicine

## 2015-08-29 ENCOUNTER — Encounter: Payer: Self-pay | Admitting: Family Medicine

## 2015-08-29 ENCOUNTER — Ambulatory Visit (INDEPENDENT_AMBULATORY_CARE_PROVIDER_SITE_OTHER): Payer: Medicare Other | Admitting: Family Medicine

## 2015-08-29 VITALS — BP 120/64 | Temp 97.9°F | Ht 66.0 in | Wt 131.3 lb

## 2015-08-29 DIAGNOSIS — R350 Frequency of micturition: Secondary | ICD-10-CM

## 2015-08-29 DIAGNOSIS — L298 Other pruritus: Secondary | ICD-10-CM

## 2015-08-29 DIAGNOSIS — R300 Dysuria: Secondary | ICD-10-CM

## 2015-08-29 DIAGNOSIS — N898 Other specified noninflammatory disorders of vagina: Secondary | ICD-10-CM

## 2015-08-29 LAB — POCT URINALYSIS DIPSTICK
Bilirubin, UA: NEGATIVE
GLUCOSE UA: NEGATIVE
Ketones, UA: NEGATIVE
NITRITE UA: NEGATIVE
PH UA: 6
Protein, UA: NEGATIVE
RBC UA: NEGATIVE
Spec Grav, UA: >=1.03
UROBILINOGEN UA: 0.2

## 2015-08-29 MED ORDER — FLUCONAZOLE 150 MG PO TABS: 150.0000 mg | ORAL_TABLET | Freq: Once | ORAL | Status: DC

## 2015-08-29 NOTE — Patient Instructions (Signed)

## 2015-08-29 NOTE — Progress Notes (Signed)
Subjective:    Patient ID: [name], female    DOB: 1933/10/05, 81 y.o.   MRN: [id number]  HPI Here for acute visit Recent extensive back surgery. She's done well since then. Comes in today with few days history of mostly vaginal itching some urine frequency Minimal discomfort with urination but mostly itching. She took some Azo-Standard without improvement Denies any fevers or chills. Did take some recent antibiotics with her surgery. Also has type 2 diabetes. No vaginal discharge. No alleviating or exacerbating factors   Past Medical History  Diagnosis Date  . Atrial enlargement, left   . Mitral regurgitation   . Mitral valve prolapse   . Palpitations   . Arthritis   . Depression   . GERD (gastroesophageal reflux disease)   . Allergy   . Hyperlipidemia   . Hypertension   . 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 H/O hiatal hernia   . Heart murmur     06/10/2014 seeing new cardiologist  . Heart murmur   . Diabetes mellitus without complication (HCC)     fasting cbg 110s  . Anxiety   . Headache(784.0)   . PONV (postoperative nausea and vomiting)     Patient stated "I like the patch behind my ear"  . UTI (lower urinary tract infection)   . HOH (hard of hearing)     HOH in left ear; needs to speak to patient in right ear   Past Surgical History  Procedure Laterality Date  . Anterior posterior and enterocele repairs  09/21/2004    With uterosacral cardinal colposuspension, partial colpocleisis; Selinda Orion, MD  . Breast enhancement surgery  02/25/2002    Bilateral reduction and excision of accessory breast tissue underneath the left breast; Aretha Parrot., MD  . Tonsillectomy and adenoidectomy    . Abdominal hysterectomy    . Cataract extraction Bilateral   . Rotator cuff repair Bilateral     11.12  . Back surgery    . Lumbar laminectomy/ decompression with met-rx N/A 03/16/2014    Procedure: LUMBAR FIVE-SACRAL ONE EXTRAFORAMINAL DISKECTOMY WITH METREX;  Surgeon: Kristeen Miss, MD;  Location: [facility];  Service: Neurosurgery;  Laterality: N/A;  . Lumbar laminectomy/decompression microdiscectomy Left 06/08/2014    Procedure: Left Lumbar Five-Sacral One Microdiskectomy;  Surgeon: Kristeen Miss, MD;  Location: [facility];  Service: Neurosurgery;  Laterality: Left;  Left L5-S1 Microdiskectomy  . Breast surgery  11    reduction  . Eye surgery Bilateral     cataract removal  . Colonoscopy    . Pelvic floor repair    . Cardiac catheterization      no PCI  . Posterior lumbar fusion 4 level N/A 08/08/2015    Procedure: T8-L1 posterior lateral fusion with decompression T12-L1;  Surgeon: Kristeen Miss, MD;  Location: [facility];  Service: Neurosurgery;  Laterality: N/A;  T8-L1 posterior lateral fusion with decompression T12-L1    reports that she has never smoked. She has never used smokeless tobacco. She reports that she drinks alcohol. She reports that she does not use illicit drugs. family history includes Arthritis in her father and mother; Heart disease in her father; Heart failure in her father; Hyperlipidemia in her father and mother; Hypertension in her mother. Allergies  Allergen Reactions  . Chlorhexidine Gluconate [Chlorhexidine] Itching  . Codeine     Facial swelling  . Cortisone     Insomnia, heart palpitations (po only)  . Statins Other (See Comments)  Myalgias   . Sulfonamide Derivatives     As a child      Review of Systems  Constitutional: Negative for fever, chills and appetite change.  Gastrointestinal: Negative for nausea, vomiting, abdominal pain, diarrhea and constipation.  Endocrine: Negative for polydipsia and polyuria.  Genitourinary: Positive for dysuria and frequency. Negative for hematuria.  Musculoskeletal: Negative for back pain.  Neurological: Negative for dizziness.       Objective:   Physical Exam  Constitutional: She appears well-developed and well-nourished.  HENT:  Head: Normocephalic and atraumatic.    Cardiovascular: Normal rate and regular rhythm.   Pulmonary/Chest: Effort normal and breath sounds normal. No respiratory distress. She has no wheezes. She has no rales.          Assessment & Plan:  Vaginal itching with mild dysuria. Concern for possible Candida vaginitis. Fluconazole 150 mg 1 dose. Urine dipstick shows only trace leukocytes otherwise negative. Urine culture sent. Follow-up probably for fever or change of symptoms

## 2015-08-29 NOTE — Progress Notes (Signed)
Pre visit review using our clinic review tool, if applicable. No additional management support is needed unless otherwise documented below in the visit note. 

## 2015-08-31 LAB — URINE CULTURE

## 2015-08-31 MED ORDER — NITROFURANTOIN MONOHYD MACRO 100 MG PO CAPS: 100.0000 mg | ORAL_CAPSULE | Freq: Two times a day (BID) | ORAL | Status: DC

## 2015-08-31 NOTE — Addendum Note (Signed)
Addended by: Elio Forget on: 08/31/2015 02:43 PM   Modules accepted: Orders

## 2015-09-08 ENCOUNTER — Other Ambulatory Visit: Payer: Self-pay | Admitting: Family Medicine

## 2015-09-19 ENCOUNTER — Other Ambulatory Visit (HOSPITAL_COMMUNITY): Payer: Medicare Other

## 2015-09-21 DIAGNOSIS — M4150 Other secondary scoliosis, site unspecified: Secondary | ICD-10-CM | POA: Diagnosis not present

## 2015-09-22 ENCOUNTER — Ambulatory Visit: Payer: Medicare Other | Admitting: Internal Medicine

## 2015-10-03 ENCOUNTER — Ambulatory Visit (INDEPENDENT_AMBULATORY_CARE_PROVIDER_SITE_OTHER): Payer: Medicare Other | Admitting: Family Medicine

## 2015-10-03 DIAGNOSIS — R300 Dysuria: Secondary | ICD-10-CM | POA: Diagnosis not present

## 2015-10-03 LAB — POCT URINALYSIS DIPSTICK
Glucose, UA: 250
Nitrite, UA: NEGATIVE
PH UA: 6
SPEC GRAV UA: 1.02
Urobilinogen, UA: 0.2

## 2015-10-03 MED ORDER — CIPROFLOXACIN HCL 500 MG PO TABS: 500.0000 mg | ORAL_TABLET | Freq: Two times a day (BID) | ORAL | Status: DC

## 2015-10-03 NOTE — Progress Notes (Signed)
Subjective:    Patient ID: [name], female    DOB: [birth year], 82 y.o.   MRN: [id number]  HPI Patient seen with recurrent dysuria. She's had some urine frequency and burning last Thursday. She had recent dysuria and had coag-negative staph that grew out. She did feel better symptomatically at that point which she took James[place]. She has allergy to sulfa. No documented fever. No flank pain. No nausea or vomiting.  Recent blood sugars have been ranging slightly higher than normal usually around 135 to 155.  Her A1c's have been consistently well controlled  Past Medical History  Diagnosis Date  . Atrial enlargement, left   . Mitral regurgitation   . Mitral valve prolapse   . Palpitations   . Arthritis   . Depression   . GERD (gastroesophageal reflux disease)   . Allergy   . Hyperlipidemia   . Hypertension   . 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 H/O hiatal hernia   . Heart murmur     06/10/2014 seeing new cardiologist  . Heart murmur   . Diabetes mellitus without complication (HCC)     fasting cbg 110s  . Anxiety   . Headache(784.0)   . PONV (postoperative nausea and vomiting)     Patient stated "I like the patch behind my ear"  . UTI (lower urinary tract infection)   . HOH (hard of hearing)     HOH in left ear; needs to speak to patient in right ear   Past Surgical History  Procedure Laterality Date  . Anterior posterior and enterocele repairs  09/21/2004    With uterosacral cardinal colposuspension, partial colpocleisis; Selinda Orion, MD  . Breast enhancement surgery  02/25/2002    Bilateral reduction and excision of accessory breast tissue underneath the left breast; Aretha Parrot., MD  . Tonsillectomy and adenoidectomy    . Abdominal hysterectomy    . Cataract extraction Bilateral   . Rotator cuff repair Bilateral     11.12  . Back surgery    . Lumbar laminectomy/ decompression with met-rx N/A 03/16/2014    Procedure: LUMBAR FIVE-SACRAL ONE EXTRAFORAMINAL DISKECTOMY  WITH METREX;  Surgeon: Kristeen Miss, MD;  Location: [place] [facility];  Service: Neurosurgery;  Laterality: N/A;  . Lumbar laminectomy/decompression microdiscectomy Left 06/08/2014    Procedure: Left Lumbar Five-Sacral One Microdiskectomy;  Surgeon: Kristeen Miss, MD;  Location: [place] [facility];  Service: Neurosurgery;  Laterality: Left;  Left L5-S1 Microdiskectomy  . Breast surgery  11    reduction  . Eye surgery Bilateral     cataract removal  . Colonoscopy    . Pelvic floor repair    . Cardiac catheterization      no PCI  . Posterior lumbar fusion 4 level N/A 08/08/2015    Procedure: T8-L1 posterior lateral fusion with decompression T12-L1;  Surgeon: Kristeen Miss, MD;  Location: [place] [facility];  Service: Neurosurgery;  Laterality: N/A;  T8-L1 posterior lateral fusion with decompression T12-L1    reports that she has never smoked. She has never used smokeless tobacco. She reports that she drinks alcohol. She reports that she does not use illicit drugs. family history includes Arthritis in her father and mother; Heart disease in her father; Heart failure in her father; Hyperlipidemia in her father and mother; Hypertension in her mother. Allergies  Allergen Reactions  . Chlorhexidine Gluconate [Chlorhexidine] Itching  . Codeine     Facial swelling  . Cortisone     Insomnia, heart palpitations (po only)  .  Statins Other (See Comments)    Myalgias   . Sulfonamide Derivatives     As a child      Review of Systems  Constitutional: Negative for fever, chills and appetite change.  Gastrointestinal: Negative for nausea, vomiting, abdominal pain, diarrhea and constipation.  Genitourinary: Positive for dysuria and frequency.  Musculoskeletal: Negative for back pain.  Neurological: Negative for dizziness.       Objective:   Physical Exam  Constitutional: She appears well-developed and well-nourished.  Cardiovascular: Normal rate and regular rhythm.   Pulmonary/Chest: Effort normal and breath  sounds normal. No respiratory distress. She has no wheezes. She has no rales.          Assessment & Plan:  Dysuria. Suspect recurrent UTI. Urine culture sent. Cipro 500 mg twice a day pending culture results

## 2015-10-03 NOTE — Progress Notes (Signed)
Pre visit review using our clinic review tool, if applicable. No additional management support is needed unless otherwise documented below in the visit note. 

## 2015-10-03 NOTE — Patient Instructions (Signed)

## 2015-10-05 LAB — URINE CULTURE

## 2015-10-10 ENCOUNTER — Other Ambulatory Visit: Payer: Self-pay | Admitting: Family Medicine

## 2015-10-17 ENCOUNTER — Ambulatory Visit (INDEPENDENT_AMBULATORY_CARE_PROVIDER_SITE_OTHER): Payer: Medicare Other | Admitting: Family Medicine

## 2015-10-17 VITALS — BP 110/80 | HR 100 | Temp 97.2°F | Ht 66.0 in | Wt 127.1 lb

## 2015-10-17 DIAGNOSIS — R300 Dysuria: Secondary | ICD-10-CM | POA: Diagnosis not present

## 2015-10-17 DIAGNOSIS — E119 Type 2 diabetes mellitus without complications: Secondary | ICD-10-CM | POA: Diagnosis not present

## 2015-10-17 DIAGNOSIS — N390 Urinary tract infection, site not specified: Secondary | ICD-10-CM | POA: Diagnosis not present

## 2015-10-17 LAB — POCT URINALYSIS DIPSTICK
Bilirubin, UA: NEGATIVE
Glucose, UA: NEGATIVE
Nitrite, UA: NEGATIVE
PH UA: 5
PROTEIN UA: NEGATIVE
SPEC GRAV UA: 1.015
Urobilinogen, UA: 0.2

## 2015-10-17 MED ORDER — CIPROFLOXACIN HCL 500 MG PO TABS: 500.0000 mg | ORAL_TABLET | Freq: Two times a day (BID) | ORAL | Status: DC

## 2015-10-17 NOTE — Patient Instructions (Signed)
Urinary Tract Infection Urinary tract infections (UTIs) can develop anywhere along your urinary tract. Your urinary tract is your body's drainage system for removing wastes and extra water. Your urinary tract includes two kidneys, two ureters, a bladder, and a urethra. Your kidneys are a pair of bean-shaped organs. Each kidney is about the size of your fist. They are located below your ribs, one on each side of your spine. CAUSES Infections are caused by microbes, which are microscopic organisms, including fungi, viruses, and bacteria. These organisms are so small that they can only be seen through a microscope. Bacteria are the microbes that most commonly cause UTIs. SYMPTOMS  Symptoms of UTIs may vary by age and gender of the patient and by the location of the infection. Symptoms in young women typically include a frequent and intense urge to urinate and a painful, burning feeling in the bladder or urethra during urination. Older women and men are more likely to be tired, shaky, and weak and have muscle aches and abdominal pain. A fever may mean the infection is in your kidneys. Other symptoms of a kidney infection include pain in your back or sides below the ribs, nausea, and vomiting. DIAGNOSIS To diagnose a UTI, your caregiver will ask you about your symptoms. Your caregiver will also ask you to provide a urine sample. The urine sample will be tested for bacteria and white blood cells. White blood cells are made by your body to help fight infection. TREATMENT  Typically, UTIs can be treated with medication. Because most UTIs are caused by a bacterial infection, they usually can be treated with the use of antibiotics. The choice of antibiotic and length of treatment depend on your symptoms and the type of bacteria causing your infection. HOME CARE INSTRUCTIONS  If you were prescribed antibiotics, take them exactly as your caregiver instructs you. Finish the medication even if you feel better after  you have only taken some of the medication.  Drink enough water and fluids to keep your urine clear or pale yellow.  Avoid caffeine, tea, and carbonated beverages. They tend to irritate your bladder.  Empty your bladder often. Avoid holding urine for long periods of time.  Empty your bladder before and after sexual intercourse.  After a bowel movement, women should cleanse from front to back. Use each tissue only once. SEEK MEDICAL CARE IF:   You have back pain.  You develop a fever.  Your symptoms do not begin to resolve within 3 days. SEEK IMMEDIATE MEDICAL CARE IF:   You have severe back pain or lower abdominal pain.  You develop chills.  You have nausea or vomiting.  You have continued burning or discomfort with urination. MAKE SURE YOU:   Understand these instructions.  Will watch your condition.  Will get help right away if you are not doing well or get worse.   This information is not intended to replace advice given to you by your health care provider. Make sure you discuss any questions you have with your health care provider.   Document Released: 06/06/2005 Document Revised: 05/18/2015 Document Reviewed: 10/05/2011 Elsevier Interactive Patient Education [employer].  We will set up Urology referral.

## 2015-10-17 NOTE — Progress Notes (Signed)
Subjective:    Patient ID: [name], female    DOB: 01/12/1934, 82 y.o.   MRN: [id number]  HPI Patient seen again today with urine frequency and burning which started this past Saturday-2 days ago. She had UTI back in January with enterococcus which was treated with Cipro and she was symptomatically improved. Past couple days noted frequency with urination and burning and possible low-grade fever over the weekend. No flank pain. No nausea or vomiting.  Patient relates that she had some type of bladder tack procedure by her gynecologist about 25 years ago. Rare episodes of stress incontinence. Frequent urgency. She feels she's not emptying her bladder fully. She frequently goes and then within 15 or 20 minutes has to go again.  Recurrent UTI:  She has had the following positive cultures: 10/03/2015 enterococcus, 07/20/2015 Pseudomonas, 07/13/2015 enterococcus species, 06/20/2015 Serratia marcescens, 12/13/2014 Staphylococcus species, 11/25/2014 enterococcus species, 04/12/2014 Klebsiella pneumonia and Escherichia hermannii.  Each time she is responded with antibiotics with improvement in symptoms  Type 2 diabetes. Recent fasting blood sugars averaging 128. He was 13 A1c was 6.4% in November.  Past Medical History  Diagnosis Date  . Atrial enlargement, left   . Mitral regurgitation   . Mitral valve prolapse   . Palpitations   . Arthritis   . Depression   . GERD (gastroesophageal reflux disease)   . Allergy   . Hyperlipidemia   . Hypertension   . 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 H/O hiatal hernia   . Heart murmur     06/10/2014 seeing new cardiologist  . Heart murmur   . Diabetes mellitus without complication (HCC)     fasting cbg 110s  . Anxiety   . Headache(784.0)   . PONV (postoperative nausea and vomiting)     Patient stated "I like the patch behind my ear"  . UTI (lower urinary tract infection)   . HOH (hard of hearing)     HOH in left ear; needs to speak to patient in right ear    Past Surgical History  Procedure Laterality Date  . Anterior posterior and enterocele repairs  09/21/2004    With uterosacral cardinal colposuspension, partial colpocleisis; Selinda Orion, MD  . Breast enhancement surgery  02/25/2002    Bilateral reduction and excision of accessory breast tissue underneath the left breast; Aretha Parrot., MD  . Tonsillectomy and adenoidectomy    . Abdominal hysterectomy    . Cataract extraction Bilateral   . Rotator cuff repair Bilateral     11.12  . Back surgery    . Lumbar laminectomy/ decompression with met-rx N/A 03/16/2014    Procedure: LUMBAR FIVE-SACRAL ONE EXTRAFORAMINAL DISKECTOMY WITH METREX;  Surgeon: Kristeen Miss, MD;  Location: [place] [facility];  Service: Neurosurgery;  Laterality: N/A;  . Lumbar laminectomy/decompression microdiscectomy Left 06/08/2014    Procedure: Left Lumbar Five-Sacral One Microdiskectomy;  Surgeon: Kristeen Miss, MD;  Location: [place] [facility];  Service: Neurosurgery;  Laterality: Left;  Left L5-S1 Microdiskectomy  . Breast surgery  11    reduction  . Eye surgery Bilateral     cataract removal  . Colonoscopy    . Pelvic floor repair    . Cardiac catheterization      no PCI  . Posterior lumbar fusion 4 level N/A 08/08/2015    Procedure: T8-L1 posterior lateral fusion with decompression T12-L1;  Surgeon: Kristeen Miss, MD;  Location: [place] [facility];  Service: Neurosurgery;  Laterality: N/A;  T8-L1 posterior lateral fusion with  decompression T12-L1    reports that she has never smoked. She has never used smokeless tobacco. She reports that she drinks alcohol. She reports that she does not use illicit drugs. family history includes Arthritis in her father and mother; Heart disease in her father; Heart failure in her father; Hyperlipidemia in her father and mother; Hypertension in her mother. Allergies  Allergen Reactions  . Chlorhexidine Gluconate [Chlorhexidine] Itching  . Codeine     Facial swelling  . Cortisone      Insomnia, heart palpitations (po only)  . Statins Other (See Comments)    Myalgias   . Sulfonamide Derivatives     As a child      Review of Systems  Constitutional: Negative for fever, chills and appetite change.  Gastrointestinal: Negative for nausea, vomiting, abdominal pain, diarrhea and constipation.  Genitourinary: Positive for dysuria and frequency. Negative for urgency and hematuria.  Musculoskeletal: Negative for back pain.  Neurological: Negative for dizziness and weakness.  Psychiatric/Behavioral: Negative for confusion.       Objective:   Physical Exam  Constitutional: She appears well-developed and well-nourished.  HENT:  Head: Normocephalic and atraumatic.  Neck: Neck supple. No thyromegaly present.  Cardiovascular: Normal rate, regular rhythm and normal heart sounds.   Pulmonary/Chest: Breath sounds normal.  Abdominal: Soft. Bowel sounds are normal. There is no tenderness.          Assessment & Plan:  #1 recurrent UTI. Patient has concerns that she has had incomplete imaging of bladder. She's had previous bladder tack operation by gynecologist roughly 25 years ago. Urine culture obtained. Start back Cipro 500 milligrams twice a day for 7 days pending culture results. Set up urology referral  #2 type 2 diabetes. History of good control. Reschedule follow-up in 2 months and repeat A1c then

## 2015-10-17 NOTE — Progress Notes (Signed)
Pre visit review using our clinic review tool, if applicable. No additional management support is needed unless otherwise documented below in the visit note. 

## 2015-10-19 ENCOUNTER — Telehealth: Payer: Self-pay | Admitting: Family Medicine

## 2015-10-19 NOTE — Telephone Encounter (Signed)
Please review results for patient.

## 2015-10-19 NOTE — Telephone Encounter (Signed)
Pt is requesting her lab results would like for [facility] to give her a call

## 2015-10-19 NOTE — Telephone Encounter (Signed)
Pt is aware of results. 

## 2015-10-19 NOTE — Telephone Encounter (Signed)
Urine cx is still pending.  

## 2015-10-20 LAB — URINE CULTURE: Colony Count: 60000

## 2015-10-24 ENCOUNTER — Telehealth: Payer: Self-pay | Admitting: Family Medicine

## 2015-10-24 MED ORDER — FLUCONAZOLE 150 MG PO TABS: 150.0000 mg | ORAL_TABLET | Freq: Once | ORAL | Status: DC

## 2015-10-24 NOTE — Telephone Encounter (Signed)
Okay 

## 2015-10-24 NOTE — Telephone Encounter (Signed)
Pt does not want to come in . Pt would like diflucan call into [facility] for yeast infection

## 2015-10-24 NOTE — Telephone Encounter (Signed)
Fluconazole 150 mg po times one dose 

## 2015-10-24 NOTE — Telephone Encounter (Signed)
Pt is aware of results. 

## 2015-10-26 DIAGNOSIS — M4150 Other secondary scoliosis, site unspecified: Secondary | ICD-10-CM | POA: Diagnosis not present

## 2015-10-26 DIAGNOSIS — M5412 Radiculopathy, cervical region: Secondary | ICD-10-CM | POA: Diagnosis not present

## 2015-10-26 DIAGNOSIS — M5410 Radiculopathy, site unspecified: Secondary | ICD-10-CM | POA: Diagnosis not present

## 2015-10-26 DIAGNOSIS — M5126 Other intervertebral disc displacement, lumbar region: Secondary | ICD-10-CM | POA: Diagnosis not present

## 2015-10-26 DIAGNOSIS — M542 Cervicalgia: Secondary | ICD-10-CM | POA: Diagnosis not present

## 2015-11-03 ENCOUNTER — Ambulatory Visit: Payer: Medicare Other | Admitting: Family Medicine

## 2015-11-07 ENCOUNTER — Other Ambulatory Visit: Payer: Self-pay | Admitting: Family Medicine

## 2015-11-09 ENCOUNTER — Encounter: Payer: Self-pay | Admitting: Family Medicine

## 2015-11-09 ENCOUNTER — Ambulatory Visit (INDEPENDENT_AMBULATORY_CARE_PROVIDER_SITE_OTHER): Payer: Medicare Other | Admitting: Family Medicine

## 2015-11-09 VITALS — BP 100/60 | HR 99 | Temp 97.8°F | Wt 127.5 lb

## 2015-11-09 DIAGNOSIS — R350 Frequency of micturition: Secondary | ICD-10-CM

## 2015-11-09 DIAGNOSIS — E119 Type 2 diabetes mellitus without complications: Secondary | ICD-10-CM

## 2015-11-09 DIAGNOSIS — R509 Fever, unspecified: Secondary | ICD-10-CM | POA: Diagnosis not present

## 2015-11-09 LAB — POCT INFLUENZA A/B
INFLUENZA A, POC: NEGATIVE
INFLUENZA B, POC: NEGATIVE

## 2015-11-09 MED ORDER — METFORMIN HCL 500 MG PO TABS: ORAL_TABLET | ORAL | Status: DC

## 2015-11-09 MED ORDER — HYDROCODONE-HOMATROPINE 5-1.5 MG/5ML PO SYRP: 5.0000 mL | ORAL_SOLUTION | Freq: Four times a day (QID) | ORAL | Status: AC | PRN

## 2015-11-09 NOTE — Patient Instructions (Signed)
Viral Infections °A viral infection can be caused by different types of viruses. Most viral infections are not serious and resolve on their own. However, some infections may cause severe symptoms and may lead to further complications. °SYMPTOMS °Viruses can frequently cause: °· Minor sore throat. °· Aches and pains. °· Headaches. °· Runny nose. °· Different types of rashes. °· Watery eyes. °· Tiredness. °· Cough. °· Loss of appetite. °· Gastrointestinal infections, resulting in nausea, vomiting, and diarrhea. °These symptoms do not respond to antibiotics because the infection is not caused by bacteria. However, you might catch a bacterial infection following the viral infection. This is sometimes called a "superinfection." Symptoms of such a bacterial infection may include: °· Worsening sore throat with pus and difficulty swallowing. °· Swollen neck glands. °· Chills and a high or persistent fever. °· Severe headache. °· Tenderness over the sinuses. °· Persistent overall ill feeling (malaise), muscle aches, and tiredness (fatigue). °· Persistent cough. °· Yellow, green, or brown mucus production with coughing. °HOME CARE INSTRUCTIONS  °· Only take over-the-counter or prescription medicines for pain, discomfort, diarrhea, or fever as directed by your caregiver. °· Drink enough water and fluids to keep your urine clear or pale yellow. Sports drinks can provide valuable electrolytes, sugars, and hydration. °· Get plenty of rest and maintain proper nutrition. Soups and broths with crackers or rice are fine. °SEEK IMMEDIATE MEDICAL CARE IF:  °· You have severe headaches, shortness of breath, chest pain, neck pain, or an unusual rash. °· You have uncontrolled vomiting, diarrhea, or you are unable to keep down fluids. °· You or your child has an oral temperature above 102° F (38.9° C), not controlled by medicine. °· Your baby is older than 3 months with a rectal temperature of 102° F (38.9° C) or higher. °· Your baby is 3  months old or younger with a rectal temperature of 100.4° F (38° C) or higher. °MAKE SURE YOU:  °· Understand these instructions. °· Will watch your condition. °· Will get help right away if you are not doing well or get worse. °  °This information is not intended to replace advice given to you by your health care provider. Make sure you discuss any questions you have with your health care provider. °  °Document Released: 06/06/2005 Document Revised: 11/19/2011 Document Reviewed: 02/02/2015 °Elsevier Interactive Patient Education ©2016 Elsevier Inc. ° °

## 2015-11-09 NOTE — Progress Notes (Signed)
Pre visit review using our clinic review tool, if applicable. No additional management support is needed unless otherwise documented below in the visit note. 

## 2015-11-09 NOTE — Progress Notes (Signed)
Subjective:    Patient ID: [name], female    DOB: Jun 03, 1934, 82 y.o.   MRN: [id number]  HPI Patient is here with several complaints and issues  2 day history of cough. She also some mild nasal congestion and severe body aches. She has some night sweats last night. She had temperature to 101 yesterday but no documented fever today. She had some mild nausea no vomiting. No sore throat. She is concerned about possible influenza.  2 day history of urine frequency and urgency. History of frequent UTIs in the past. Denies any current burning. No flank pain.  Type 2 diabetes. History of good control. Taking metformin 500 mg 2 tablets twice daily. Last A1c 6.4% back in November. Recent average fasting blood sugars are on 145 which is slightly higher than usual for her. No polydipsia.  Past Medical History  Diagnosis Date  . Atrial enlargement, left   . Mitral regurgitation   . Mitral valve prolapse   . Palpitations   . Arthritis   . Depression   . GERD (gastroesophageal reflux disease)   . Allergy   . Hyperlipidemia   . Hypertension   . 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 H/O hiatal hernia   . Heart murmur     06/10/2014 seeing new cardiologist  . Heart murmur   . Diabetes mellitus without complication (HCC)     fasting cbg 110s  . Anxiety   . Headache(784.0)   . PONV (postoperative nausea and vomiting)     Patient stated "I like the patch behind my ear"  . UTI (lower urinary tract infection)   . HOH (hard of hearing)     HOH in left ear; needs to speak to patient in right ear   Past Surgical History  Procedure Laterality Date  . Anterior posterior and enterocele repairs  09/21/2004    With uterosacral cardinal colposuspension, partial colpocleisis; Selinda Orion, MD  . Breast enhancement surgery  02/25/2002    Bilateral reduction and excision of accessory breast tissue underneath the left breast; Aretha Parrot., MD  . Tonsillectomy and adenoidectomy    . Abdominal  hysterectomy    . Cataract extraction Bilateral   . Rotator cuff repair Bilateral     11.12  . Back surgery    . Lumbar laminectomy/ decompression with met-rx N/A 03/16/2014    Procedure: LUMBAR FIVE-SACRAL ONE EXTRAFORAMINAL DISKECTOMY WITH METREX;  Surgeon: Kristeen Miss, MD;  Location: [place] [facility];  Service: Neurosurgery;  Laterality: N/A;  . Lumbar laminectomy/decompression microdiscectomy Left 06/08/2014    Procedure: Left Lumbar Five-Sacral One Microdiskectomy;  Surgeon: Kristeen Miss, MD;  Location: [place] [facility];  Service: Neurosurgery;  Laterality: Left;  Left L5-S1 Microdiskectomy  . Breast surgery  11    reduction  . Eye surgery Bilateral     cataract removal  . Colonoscopy    . Pelvic floor repair    . Cardiac catheterization      no PCI  . Posterior lumbar fusion 4 level N/A 08/08/2015    Procedure: T8-L1 posterior lateral fusion with decompression T12-L1;  Surgeon: Kristeen Miss, MD;  Location: [place] [facility];  Service: Neurosurgery;  Laterality: N/A;  T8-L1 posterior lateral fusion with decompression T12-L1    reports that she has never smoked. She has never used smokeless tobacco. She reports that she drinks alcohol. She reports that she does not use illicit drugs. family history includes Arthritis in her father and mother; Heart disease in her father; Heart  failure in her father; Hyperlipidemia in her father and mother; Hypertension in her mother. Allergies  Allergen Reactions  . Chlorhexidine Gluconate [Chlorhexidine] Itching  . Codeine     Facial swelling  . Cortisone     Insomnia, heart palpitations (po only)  . Statins Other (See Comments)    Myalgias   . Sulfonamide Derivatives     As a child      Review of Systems  Constitutional: Positive for fever and chills.  Respiratory: Positive for cough.   Cardiovascular: Negative for chest pain.  Gastrointestinal: Negative for abdominal pain.  Genitourinary: Positive for frequency. Negative for hematuria.         Objective:   Physical Exam  Constitutional: She appears well-developed and well-nourished.  HENT:  Mouth/Throat: Oropharynx is clear and moist.  Neck: Neck supple. No thyromegaly present.  Cardiovascular: Normal rate and regular rhythm.   Pulmonary/Chest: Effort normal and breath sounds normal. No respiratory distress. She has no wheezes. She has no rales.  Musculoskeletal: She exhibits no edema.  Neurological: She is alert.          Assessment & Plan:  #1 febrile illness. Suspect viral. Rule out influenza. Will check rapid screen. If positive would consider Tamiflu given her age and the fact this is less than 48 hours in Flu screen negative.  Hycodan cough syrup as needed for night cough.  #2 history of type 2 diabetes. History of excellent control but recent trend toward increasing fasting sugars. Patient declines A1c today. Continue to monitor. Bring back in one month and repeat labs including hemoglobin A1c then  #3 urine frequency. Patient concern regarding recurrent UTI. She will try to give [place] urine specimen. She has pending follow-up with urology regarding recurrent UTIs

## 2015-11-11 DIAGNOSIS — R350 Frequency of micturition: Secondary | ICD-10-CM | POA: Diagnosis not present

## 2015-11-11 LAB — POC URINALSYSI DIPSTICK (AUTOMATED)
Bilirubin, UA: NEGATIVE
Glucose, UA: NEGATIVE
KETONES UA: NEGATIVE
Leukocytes, UA: NEGATIVE
Nitrite, UA: POSITIVE
PH UA: 5.5
PROTEIN UA: NEGATIVE
SPEC GRAV UA: 1.025
Urobilinogen, UA: 0.2

## 2015-11-11 NOTE — Addendum Note (Signed)
Addended by: Gari Crown D on: 11/11/2015 10:47 AM   Modules accepted: Orders

## 2015-11-11 NOTE — Addendum Note (Signed)
Addended by: Elmer Picker on: 11/11/2015 11:29 AM   Modules accepted: Orders

## 2015-11-14 LAB — URINE CULTURE

## 2015-11-15 MED ORDER — FLUCONAZOLE 150 MG PO TABS: 150.0000 mg | ORAL_TABLET | Freq: Once | ORAL | Status: DC

## 2015-11-15 NOTE — Addendum Note (Signed)
Addended by: Elio Forget on: 11/15/2015 01:36 PM   Modules accepted: Orders

## 2015-11-16 MED ORDER — FOSFOMYCIN TROMETHAMINE 3 G PO PACK: 3.0000 g | PACK | Freq: Once | ORAL | Status: DC

## 2015-11-16 NOTE — Addendum Note (Signed)
Addended by: Elio Forget on: 11/16/2015 08:30 AM   Modules accepted: Orders

## 2015-11-25 ENCOUNTER — Telehealth: Payer: Self-pay | Admitting: Family Medicine

## 2015-11-25 ENCOUNTER — Ambulatory Visit (INDEPENDENT_AMBULATORY_CARE_PROVIDER_SITE_OTHER): Payer: Medicare Other | Admitting: Family Medicine

## 2015-11-25 VITALS — BP 112/80 | HR 93 | Temp 98.3°F | Ht 66.0 in | Wt 128.8 lb

## 2015-11-25 DIAGNOSIS — R300 Dysuria: Secondary | ICD-10-CM

## 2015-11-25 DIAGNOSIS — N390 Urinary tract infection, site not specified: Secondary | ICD-10-CM

## 2015-11-25 LAB — POC URINALSYSI DIPSTICK (AUTOMATED)
Bilirubin, UA: NEGATIVE
Blood, UA: NEGATIVE
GLUCOSE UA: NEGATIVE
Ketones, UA: NEGATIVE
LEUKOCYTES UA: NEGATIVE
Nitrite, UA: POSITIVE
PROTEIN UA: NEGATIVE
SPEC GRAV UA: 1.01
UROBILINOGEN UA: 0.2
pH, UA: 7

## 2015-11-25 MED ORDER — FOSFOMYCIN TROMETHAMINE 3 G PO PACK: 3.0000 g | PACK | Freq: Once | ORAL | Status: DC

## 2015-11-25 NOTE — Progress Notes (Signed)
Subjective:    Patient ID: [name], female    DOB: 10-Aug-1934, 82 y.o.   MRN: [id number]  HPI Patient has frequent history of UTI. Pending appointment with urology. Back in February had urinary symptoms with low colony count of Pseudomonas but no fever. Spoke with ID and they recommended observation because of the lack of fever low college account explained she is likely colonized. She then returned recently couple weeks ago again with recurrent symptoms but again no fever. Culture grew out 100,000 colonies of Pseudomonas. We prescribed fosfomycin given the fact this was resistant to all other oral medications including Cipro and Levaquin. Unfortunately, her insurance apparently did not cover this and we were never notified by patient or pharmacy until today.  She started having more urine frequency and burning with urination couple days ago. Again has had no fever whatsoever. No nausea or vomiting. No hypotension. Keeping down fluids well.  Past Medical History  Diagnosis Date  . Atrial enlargement, left   . Mitral regurgitation   . Mitral valve prolapse   . Palpitations   . Arthritis   . Depression   . GERD (gastroesophageal reflux disease)   . Allergy   . Hyperlipidemia   . Hypertension   . 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 H/O hiatal hernia   . Heart murmur     06/10/2014 seeing new cardiologist  . Heart murmur   . Diabetes mellitus without complication (HCC)     fasting cbg 110s  . Anxiety   . Headache(784.0)   . PONV (postoperative nausea and vomiting)     Patient stated "I like the patch behind my ear"  . UTI (lower urinary tract infection)   . HOH (hard of hearing)     HOH in left ear; needs to speak to patient in right ear   Past Surgical History  Procedure Laterality Date  . Anterior posterior and enterocele repairs  09/21/2004    With uterosacral cardinal colposuspension, partial colpocleisis; Selinda Orion, MD  . Breast enhancement surgery  02/25/2002   Bilateral reduction and excision of accessory breast tissue underneath the left breast; Aretha Parrot., MD  . Tonsillectomy and adenoidectomy    . Abdominal hysterectomy    . Cataract extraction Bilateral   . Rotator cuff repair Bilateral     11.12  . Back surgery    . Lumbar laminectomy/ decompression with met-rx N/A 03/16/2014    Procedure: LUMBAR FIVE-SACRAL ONE EXTRAFORAMINAL DISKECTOMY WITH METREX;  Surgeon: Kristeen Miss, MD;  Location: [place] [facility];  Service: Neurosurgery;  Laterality: N/A;  . Lumbar laminectomy/decompression microdiscectomy Left 06/08/2014    Procedure: Left Lumbar Five-Sacral One Microdiskectomy;  Surgeon: Kristeen Miss, MD;  Location: [place] [facility];  Service: Neurosurgery;  Laterality: Left;  Left L5-S1 Microdiskectomy  . Breast surgery  11    reduction  . Eye surgery Bilateral     cataract removal  . Colonoscopy    . Pelvic floor repair    . Cardiac catheterization      no PCI  . Posterior lumbar fusion 4 level N/A 08/08/2015    Procedure: T8-L1 posterior lateral fusion with decompression T12-L1;  Surgeon: Kristeen Miss, MD;  Location: [facility];  Service: Neurosurgery;  Laterality: N/A;  T8-L1 posterior lateral fusion with decompression T12-L1    reports that she has never smoked. She has never used smokeless tobacco. She reports that she drinks alcohol. She reports that she does not use illicit drugs. family history includes  Arthritis in her father and mother; Heart disease in her father; Heart failure in her father; Hyperlipidemia in her father and mother; Hypertension in her mother. Allergies  Allergen Reactions  . Chlorhexidine Gluconate [Chlorhexidine] Itching  . Codeine     Facial swelling  . Cortisone     Insomnia, heart palpitations (po only)  . Statins Other (See Comments)    Myalgias   . Sulfonamide Derivatives     As a child      Review of Systems  Constitutional: Negative for fever, chills and appetite change.  Gastrointestinal:  Negative for nausea, vomiting, abdominal pain, diarrhea and constipation.  Genitourinary: Positive for dysuria and frequency.  Musculoskeletal: Negative for back pain.  Neurological: Negative for dizziness.       Objective:   Physical Exam  Constitutional: She appears well-developed and well-nourished.  HENT:  Mouth/Throat: Oropharynx is clear and moist.  Cardiovascular: Normal rate and regular rhythm.   Pulmonary/Chest: Effort normal and breath sounds normal. No respiratory distress. She has no wheezes. She has no rales.  Neurological: She is alert.          Assessment & Plan:  Recurrent dysuria. History of frequent UTI and probable colonization. She presents today with increased urinary symptoms but again no fever. Recent urine culture positive for Pseudomonas. Resistant to multiple drugs. We had recommended fosfomycin as above but apparently her insurance denied. We will try to get prior authorization.   I am concerned because of the increased colony count from recent culture. We have contacted her insurance company for prior authorization for fosfomycin. Our only other alternative would be IV drug. I'm concerned that if this does not go through soon with her insurance that she could end up with complication such as sepsis even though she looks good today and is afebrile

## 2015-11-25 NOTE — Telephone Encounter (Signed)
Pt call to see if you have a answer on her PA. She said you told her to call before the end of day

## 2015-11-25 NOTE — Progress Notes (Signed)
Pre visit review using our clinic review tool, if applicable. No additional management support is needed unless otherwise documented below in the visit note. 

## 2015-11-25 NOTE — Telephone Encounter (Signed)
Pt is aware that PA has been submitted. Advised to contact the pharmacy in a few hours to see if it went through insurance is suppose to give a finally answer today.

## 2015-11-28 ENCOUNTER — Telehealth: Payer: Self-pay | Admitting: Family Medicine

## 2015-11-28 NOTE — Telephone Encounter (Signed)
Pt husband is aware med was denied and Estill Bamberg is taking the denial letter to md

## 2015-11-28 NOTE — Telephone Encounter (Signed)
Pt is aware that medication is still pending. She c/o increased back pain in her kidney area. Denies any fever.

## 2015-11-28 NOTE — Telephone Encounter (Signed)
Pt is aware that our office is waiting for approval from the insurance company. She will notify us if she starts to feel confused or developes a fever.

## 2015-11-28 NOTE — Telephone Encounter (Signed)
We are desperately trying to get prior authorization for Fosfomycin.  Her insurance has been denying.  Fortunately, she does not have any fever- yet.

## 2015-11-28 NOTE — Telephone Encounter (Signed)
Spoke with the pharmacy this morning and RX has not been approved.

## 2015-11-28 NOTE — Telephone Encounter (Signed)
Pt following up on pre authorization for  fosfomycin (MONUROL) 3 g PACK  Pt states she spoke with [facility] and they need more information. Pt states she really needs this med.

## 2015-11-28 NOTE — Telephone Encounter (Signed)
See if there is someone with her insurance I can talk to.

## 2015-11-28 NOTE — Telephone Encounter (Signed)
Expedited request submitted to [facility] on behalf of patient.

## 2015-11-29 NOTE — Telephone Encounter (Signed)
Form has been faxed to the insurance company for medication to be covered.

## 2015-11-29 NOTE — Telephone Encounter (Signed)
Pt states she is going to go ahead and pay full price for the med, due to the nature of this issue. Pt states her temp is 98.6 and normally 97.  Pt states this is high for her.

## 2015-11-30 ENCOUNTER — Other Ambulatory Visit: Payer: Self-pay | Admitting: Family Medicine

## 2015-11-30 ENCOUNTER — Telehealth: Payer: Self-pay | Admitting: Family Medicine

## 2015-11-30 NOTE — Telephone Encounter (Signed)
Pt took medication last PM and is already feeling much better. She has a lab appt to have her A1C checked on 12/13/2015, would it be okay to add on a urine and culture to orders to ensure her urine is clear?  The Pharmacy is going to reimburse her the $90 she paid for the medication.

## 2015-11-30 NOTE — Telephone Encounter (Signed)
I would not repeat culture and less she is febrile. She is likely colonized.

## 2015-11-30 NOTE — Telephone Encounter (Signed)
Pt paid for medication out of pocket

## 2015-11-30 NOTE — Telephone Encounter (Signed)
Tiffany from [facility] call to say the following med has been approve fosfomycin (MONUROL) 3 g PACK

## 2015-11-30 NOTE — Telephone Encounter (Signed)
See other messaged dated 11/30/2015.

## 2015-12-01 ENCOUNTER — Other Ambulatory Visit: Payer: Self-pay | Admitting: Internal Medicine

## 2015-12-01 ENCOUNTER — Ambulatory Visit (HOSPITAL_COMMUNITY): Payer: Medicare Other | Attending: Cardiology

## 2015-12-01 ENCOUNTER — Other Ambulatory Visit: Payer: Self-pay

## 2015-12-01 DIAGNOSIS — I350 Nonrheumatic aortic (valve) stenosis: Secondary | ICD-10-CM

## 2015-12-01 DIAGNOSIS — E785 Hyperlipidemia, unspecified: Secondary | ICD-10-CM | POA: Diagnosis not present

## 2015-12-01 DIAGNOSIS — I119 Hypertensive heart disease without heart failure: Secondary | ICD-10-CM | POA: Insufficient documentation

## 2015-12-01 DIAGNOSIS — E119 Type 2 diabetes mellitus without complications: Secondary | ICD-10-CM | POA: Insufficient documentation

## 2015-12-01 DIAGNOSIS — I340 Nonrheumatic mitral (valve) insufficiency: Secondary | ICD-10-CM | POA: Insufficient documentation

## 2015-12-01 DIAGNOSIS — I341 Nonrheumatic mitral (valve) prolapse: Secondary | ICD-10-CM | POA: Diagnosis not present

## 2015-12-01 NOTE — Telephone Encounter (Signed)
Pt is aware that UA or Culture is not recommended.

## 2015-12-09 ENCOUNTER — Ambulatory Visit: Payer: Medicare Other | Admitting: Internal Medicine

## 2015-12-12 ENCOUNTER — Ambulatory Visit (INDEPENDENT_AMBULATORY_CARE_PROVIDER_SITE_OTHER): Payer: Medicare Other | Admitting: Family Medicine

## 2015-12-12 ENCOUNTER — Encounter: Payer: Self-pay | Admitting: Family Medicine

## 2015-12-12 VITALS — BP 128/64 | HR 81 | Temp 98.0°F | Ht 66.0 in | Wt 129.5 lb

## 2015-12-12 DIAGNOSIS — I10 Essential (primary) hypertension: Secondary | ICD-10-CM

## 2015-12-12 DIAGNOSIS — B965 Pseudomonas (aeruginosa) (mallei) (pseudomallei) as the cause of diseases classified elsewhere: Secondary | ICD-10-CM | POA: Diagnosis not present

## 2015-12-12 DIAGNOSIS — E119 Type 2 diabetes mellitus without complications: Secondary | ICD-10-CM | POA: Diagnosis not present

## 2015-12-12 DIAGNOSIS — A498 Other bacterial infections of unspecified site: Secondary | ICD-10-CM

## 2015-12-12 LAB — POCT GLYCOSYLATED HEMOGLOBIN (HGB A1C): HEMOGLOBIN A1C: 6.3

## 2015-12-12 NOTE — Progress Notes (Signed)
Subjective:    Patient ID: [name], female    DOB: October 23, 1933, 82 y.o.   MRN: [id number]  HPI Patient seen for medical follow-up.  Recent dysuria. Urine culture grew out Pseudomonas 100,000 colonies and patient was symptomatic. We prescribed fosfomycin and had to go through tremendous amount of work to get this approved by her insurance. Our only other alternative was to hospitalize her for IV medication. They finally approved this and patient states she feels "100% better" no fever. No chills. No burning with urination.  Type 2 diabetes. Has been well controlled. Patient concerned because a recent fasting blood sugars between 110 and 130. Denies any polyuria or polydipsia.  Hypertension which has been well controlled with lisinopril HCTZ and diltiazem. Denies any recent dizziness. No chest pains. No headaches. She had recent echo with moderate mitral regurgitation. Minimal progression. Follow-up with cardiology soon  Past Medical History  Diagnosis Date  . Atrial enlargement, left   . Mitral regurgitation   . Mitral valve prolapse   . Palpitations   . Arthritis   . Depression   . GERD (gastroesophageal reflux disease)   . Allergy   . Hyperlipidemia   . Hypertension   . 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 H/O hiatal hernia   . Heart murmur     06/10/2014 seeing new cardiologist  . Heart murmur   . Diabetes mellitus without complication (HCC)     fasting cbg 110s  . Anxiety   . Headache(784.0)   . PONV (postoperative nausea and vomiting)     Patient stated "I like the patch behind my ear"  . UTI (lower urinary tract infection)   . HOH (hard of hearing)     HOH in left ear; needs to speak to patient in right ear   Past Surgical History  Procedure Laterality Date  . Anterior posterior and enterocele repairs  09/21/2004    With uterosacral cardinal colposuspension, partial colpocleisis; Selinda Orion, MD  . Breast enhancement surgery  02/25/2002    Bilateral reduction and  excision of accessory breast tissue underneath the left breast; Aretha Parrot., MD  . Tonsillectomy and adenoidectomy    . Abdominal hysterectomy    . Cataract extraction Bilateral   . Rotator cuff repair Bilateral     11.12  . Back surgery    . Lumbar laminectomy/ decompression with met-rx N/A 03/16/2014    Procedure: LUMBAR FIVE-SACRAL ONE EXTRAFORAMINAL DISKECTOMY WITH METREX;  Surgeon: Kristeen Miss, MD;  Location: [place] [facility];  Service: Neurosurgery;  Laterality: N/A;  . Lumbar laminectomy/decompression microdiscectomy Left 06/08/2014    Procedure: Left Lumbar Five-Sacral One Microdiskectomy;  Surgeon: Kristeen Miss, MD;  Location: [place] [facility];  Service: Neurosurgery;  Laterality: Left;  Left L5-S1 Microdiskectomy  . Breast surgery  11    reduction  . Eye surgery Bilateral     cataract removal  . Colonoscopy    . Pelvic floor repair    . Cardiac catheterization      no PCI  . Posterior lumbar fusion 4 level N/A 08/08/2015    Procedure: T8-L1 posterior lateral fusion with decompression T12-L1;  Surgeon: Kristeen Miss, MD;  Location: [place] [facility];  Service: Neurosurgery;  Laterality: N/A;  T8-L1 posterior lateral fusion with decompression T12-L1    reports that she has never smoked. She has never used smokeless tobacco. She reports that she drinks alcohol. She reports that she does not use illicit drugs. family history includes Arthritis in her father and  mother; Heart disease in her father; Heart failure in her father; Hyperlipidemia in her father and mother; Hypertension in her mother. Allergies  Allergen Reactions  . Chlorhexidine Gluconate [Chlorhexidine] Itching  . Codeine     Facial swelling  . Cortisone     Insomnia, heart palpitations (po only)  . Statins Other (See Comments)    Myalgias   . Sulfonamide Derivatives     As a child      Review of Systems  Constitutional: Negative for fatigue.  Eyes: Negative for visual disturbance.  Respiratory: Negative for  cough, chest tightness, shortness of breath and wheezing.   Cardiovascular: Negative for chest pain, palpitations and leg swelling.  Endocrine: Negative for polydipsia and polyuria.  Genitourinary: Negative for dysuria.  Neurological: Negative for dizziness, seizures, syncope, weakness, light-headedness and headaches.       Objective:   Physical Exam  Constitutional: She appears well-developed and well-nourished.  Eyes: Pupils are equal, round, and reactive to light.  Neck: Neck supple. No JVD present. No thyromegaly present.  Cardiovascular: Normal rate and regular rhythm.  Exam reveals no gallop.   Pulmonary/Chest: Effort normal and breath sounds normal. No respiratory distress. She has no wheezes. She has no rales.  Musculoskeletal: She exhibits no edema.  Neurological: She is alert.          Assessment & Plan:  #1 recent dysuria with urine culture positive for Pseudomonas greater than 100,000 colonies. Symptomatically improved following treatment with fosfomycin  #2 type 2 diabetes. History of good control on metformin. Recheck A1c  #3 hypertension. Stable and at goal. Continue current medications

## 2015-12-29 ENCOUNTER — Ambulatory Visit (INDEPENDENT_AMBULATORY_CARE_PROVIDER_SITE_OTHER): Payer: Medicare Other | Admitting: Internal Medicine

## 2015-12-29 ENCOUNTER — Encounter: Payer: Self-pay | Admitting: Internal Medicine

## 2015-12-29 VITALS — BP 110/70 | HR 77 | Ht 66.0 in | Wt 127.0 lb

## 2015-12-29 DIAGNOSIS — I059 Rheumatic mitral valve disease, unspecified: Secondary | ICD-10-CM

## 2015-12-29 DIAGNOSIS — I10 Essential (primary) hypertension: Secondary | ICD-10-CM

## 2015-12-29 NOTE — Patient Instructions (Signed)
Your physician recommends that you continue on your current medications as directed. Please refer to the Current Medication list given to you today.  Your physician wants you to follow-up in: 1 year with Dr. Harrington Challenger.   (with echo same day).   You will receive a reminder letter in the mail two months in advance. If you don't receive a letter, please call our office to schedule the follow-up appointment. Your physician has requested that you have an echocardiogram. Echocardiography is a painless test that uses sound waves to create images of your heart. It provides your doctor with information about the size and shape of your heart and how well your heart's chambers and valves are working. This procedure takes approximately one hour. There are no restrictions for this procedure.  Your physician has requested that you have an echocardiogram. Echocardiography is a painless test that uses sound waves to create images of your heart. It provides your doctor with information about the size and shape of your heart and how well your heart's chambers and valves are working. This procedure takes approximately one hour. There are no restrictions for this procedure.  PLEASE SCHEDULE FOR NEXT MARCH/APRIL--SAME DAY AS DR. ROSS APPOINTMENT.

## 2015-12-29 NOTE — Progress Notes (Signed)
Cardiology Office Note   Date:  12/29/2015   ID:  [name], [name] March 09, 1934, MRN [id number]  PCP:  Eulas Post, MD  Cardiologist:   Dorris Carnes, MD   F/U of MR    History of Present Illness: [name] is a 82 y.o. female with a history ofHTN and MVP/MR  I saw him in May 2016  Also history of occasional palpitatiosn   Echo in March 2017  MR appeared moderate     Since seen had back surgery  Was SOB  Improoving   No CP      Outpatient Prescriptions Prior to Visit  Medication Sig Dispense Refill  . citalopram (CELEXA) 20 MG tablet     . clonazePAM (KLONOPIN) 1 MG tablet TAKE 1 TABLET BY MOUTH EVERY NIGHT AT BEDTIME AS DIRECTED 30 tablet 1  . diltiazem (DILACOR XR) 240 MG 24 hr capsule     . diphenhydrAMINE (BENADRYL) 25 mg capsule Take 25 mg by mouth daily as needed for allergies (Takes only during allergy seasons).    . fluticasone (FLONASE) 50 MCG/ACT nasal spray SHAKE LIQUID AND USE 2 SPRAYS IN EACH NOSTRIL DAILY AS NEEDED FOR ALLERGIES 16 g 1  . lisinopril-hydrochlorothiazide (PRINZIDE,ZESTORETIC) 10-12.5 MG per tablet Take 1 tablet by mouth daily. 90 tablet 3  . metFORMIN (GLUCOPHAGE) 500 MG tablet Take two tablets twice daily 360 tablet 3  . ONETOUCH DELICA LANCETS 76E MISC TEST TWICE DAILY AS DIRECTED 100 each 0  . ONETOUCH VERIO test strip CHECK BLOOD GLUCOSE TWICE DAILY AS DIRECTED 100 each 1  . Probiotic Product (PROBIOTIC DAILY PO) Take 1 capsule by mouth daily. Reported on 08/29/2015     No facility-administered medications prior to visit.     Allergies:   Chlorhexidine gluconate; Codeine; Cortisone; Statins; and Sulfonamide derivatives   Past Medical History  Diagnosis Date  . Atrial enlargement, left   . Mitral regurgitation   . Mitral valve prolapse   . Palpitations   . Arthritis   . Depression   . GERD (gastroesophageal reflux disease)   . Allergy   . Hyperlipidemia   . Hypertension   . 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 H/O hiatal hernia   .  Heart murmur     06/10/2014 seeing new cardiologist  . Heart murmur   . Diabetes mellitus without complication (HCC)     fasting cbg 110s  . Anxiety   . Headache(784.0)   . PONV (postoperative nausea and vomiting)     Patient stated "I like the patch behind my ear"  . UTI (lower urinary tract infection)   . HOH (hard of hearing)     HOH in left ear; needs to speak to patient in right ear    Past Surgical History  Procedure Laterality Date  . Anterior posterior and enterocele repairs  09/21/2004    With uterosacral cardinal colposuspension, partial colpocleisis; Selinda Orion, MD  . Breast enhancement surgery  02/25/2002    Bilateral reduction and excision of accessory breast tissue underneath the left breast; Aretha Parrot., MD  . Tonsillectomy and adenoidectomy    . Abdominal hysterectomy    . Cataract extraction Bilateral   . Rotator cuff repair Bilateral     11.12  . Back surgery    . Lumbar laminectomy/ decompression with met-rx N/A 03/16/2014    Procedure: LUMBAR FIVE-SACRAL ONE EXTRAFORAMINAL DISKECTOMY WITH METREX;  Surgeon: Kristeen Miss, MD;  Location: [place] [facility];  Service: Neurosurgery;  Laterality: N/A;  .  Lumbar laminectomy/decompression microdiscectomy Left 06/08/2014    Procedure: Left Lumbar Five-Sacral One Microdiskectomy;  Surgeon: Henry Elsner, MD;  Location: [facility];  Service: Neurosurgery;  Laterality: Left;  Left L5-S1 Microdiskectomy  . Breast surgery  11    reduction  . Eye surgery Bilateral     cataract removal  . Colonoscopy    . Pelvic floor repair    . Cardiac catheterization      no PCI  . Posterior lumbar fusion 4 level N/A 08/08/2015    Procedure: T8-L1 posterior lateral fusion with decompression T12-L1;  Surgeon: Henry Elsner, MD;  Location: [facility];  Service: Neurosurgery;  Laterality: N/A;  T8-L1 posterior lateral fusion with decompression T12-L1     Social History:  The patient  reports that she has never smoked. She has  never used smokeless tobacco. She reports that she drinks alcohol. She reports that she does not use illicit drugs.   Family History:  The patient's family history includes Arthritis in her father and mother; Heart disease in her father; Heart failure in her father; Hyperlipidemia in her father and mother; Hypertension in her mother.    ROS:  Please see the history of present illness. All other systems are reviewed and  Negative to the above problem except as noted.    PHYSICAL EXAM: VS:  BP 110/70 mmHg  Pulse 77  Ht 5' 6" (1.676 m)  Wt 127 lb (57.607 kg)  BMI 20.51 kg/m2  GEN: Well nourished, well developed, in no acute distress HEENT: normal Neck: no JVD, carotid bruits, or masses Cardiac: RRR; Gr I=II/VI systl8c murmur  rubs, or gallops,no edema  Respiratory:  clear to auscultation bilaterally, normal work of breathing GI: soft, nontender, nondistended, + BS  No hepatomegaly  MS: no deformity Moving all extremities   Skin: warm and dry, no rash Neuro:  Strength and sensation are intact Psych: euthymic mood, full affect   EKG:  EKG is ordered today.  Ectopic atrial 77 bpm     Lipid Panel    Component Value Date/Time   CHOL 195 05/21/2014 0953   TRIG 141.0 05/21/2014 0953   HDL 52.80 05/21/2014 0953   CHOLHDL 4 05/21/2014 0953   VLDL 28.2 05/21/2014 0953   LDLCALC 114* 05/21/2014 0953   LDLDIRECT 143.6 06/04/2012 0931      Wt Readings from Last 3 Encounters:  12/29/15 127 lb (57.607 kg)  12/12/15 129 lb 8 oz (58.741 kg)  11/25/15 128 lb 12.8 oz (58.423 kg)      ASSESSMENT AND PLAN:  1  Mtirlal regurg  Remains moderate  WIll contiue to follow with periodic echoes  2  HTN  Good control    3  HL  Intoleratnat to statins  WIll need to set up for fasting lipids    Signed, Paula Ross, MD  12/29/2015 10:00 AM    [facility] [address], [place], [place]  [address] Phone: [phone]; Fax: [phone]     

## 2015-12-30 ENCOUNTER — Other Ambulatory Visit: Payer: Self-pay | Admitting: *Deleted

## 2015-12-30 ENCOUNTER — Other Ambulatory Visit: Payer: Self-pay | Admitting: Family Medicine

## 2015-12-30 MED ORDER — LISINOPRIL-HYDROCHLOROTHIAZIDE 10-12.5 MG PO TABS: 1.0000 | ORAL_TABLET | Freq: Every day | ORAL | Status: DC

## 2015-12-30 MED ORDER — DILTIAZEM HCL ER 240 MG PO CP24: 240.0000 mg | ORAL_CAPSULE | Freq: Every day | ORAL | Status: DC

## 2015-12-30 NOTE — Addendum Note (Signed)
Addended by: Rodman Key on: 12/30/2015 08:38 AM   Modules accepted: Orders

## 2016-01-02 ENCOUNTER — Ambulatory Visit (INDEPENDENT_AMBULATORY_CARE_PROVIDER_SITE_OTHER): Payer: Medicare Other | Admitting: Family Medicine

## 2016-01-02 ENCOUNTER — Other Ambulatory Visit: Payer: Self-pay

## 2016-01-02 ENCOUNTER — Other Ambulatory Visit: Payer: Self-pay | Admitting: *Deleted

## 2016-01-02 ENCOUNTER — Encounter: Payer: Self-pay | Admitting: Family Medicine

## 2016-01-02 VITALS — BP 110/80 | HR 91 | Temp 98.2°F | Ht 66.0 in | Wt 127.2 lb

## 2016-01-02 DIAGNOSIS — A499 Bacterial infection, unspecified: Secondary | ICD-10-CM | POA: Diagnosis not present

## 2016-01-02 DIAGNOSIS — J019 Acute sinusitis, unspecified: Secondary | ICD-10-CM

## 2016-01-02 DIAGNOSIS — H109 Unspecified conjunctivitis: Secondary | ICD-10-CM

## 2016-01-02 DIAGNOSIS — H1089 Other conjunctivitis: Secondary | ICD-10-CM | POA: Diagnosis not present

## 2016-01-02 DIAGNOSIS — E785 Hyperlipidemia, unspecified: Secondary | ICD-10-CM

## 2016-01-02 DIAGNOSIS — F329 Major depressive disorder, single episode, unspecified: Secondary | ICD-10-CM

## 2016-01-02 DIAGNOSIS — F32A Depression, unspecified: Secondary | ICD-10-CM

## 2016-01-02 MED ORDER — TOBRAMYCIN 0.3 % OP SOLN: 2.0000 [drp] | OPHTHALMIC | Status: DC

## 2016-01-02 MED ORDER — DOXYCYCLINE HYCLATE 100 MG PO CAPS: 100.0000 mg | ORAL_CAPSULE | Freq: Two times a day (BID) | ORAL | Status: DC

## 2016-01-02 MED ORDER — CITALOPRAM HYDROBROMIDE 20 MG PO TABS: 20.0000 mg | ORAL_TABLET | Freq: Every day | ORAL | Status: DC

## 2016-01-02 MED ORDER — CLONAZEPAM 1 MG PO TABS: ORAL_TABLET | ORAL | Status: DC

## 2016-01-02 NOTE — Progress Notes (Signed)
Subjective:    Patient ID: [name], female    DOB: [birth year], 82 y.o.   MRN: [id number]  HPI Patient seen for the several following items:  Acute new problem of 7-8 day history of sinus congestion, increased facial pain maxillary and frontal bilaterally. Associated headaches. Increased malaise. Cough productive of yellow sputum. Occasional blood-tinged nasal mucus. Using warm compresses without much relief.    Patient also relates one day history of redness left eye. No visual changes. No eye pain. Left eye was matted last night with crusted thick drainage. Minimal crusting right eye. No eye injury.  Patient has history of recurrent depression. Currently stable. Requesting refills of Celexa. She uses low-dose clonazepam at night for severe anxiety and insomnia symptoms. Requesting refills.  Allergic to sulfa. No other antibiotic allergies.  Past Medical History  Diagnosis Date  . Atrial enlargement, left   . Mitral regurgitation   . Mitral valve prolapse   . Palpitations   . Arthritis   . Depression   . GERD (gastroesophageal reflux disease)   . Allergy   . Hyperlipidemia   . Hypertension   . 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 H/O hiatal hernia   . Heart murmur     06/10/2014 seeing new cardiologist  . Heart murmur   . Diabetes mellitus without complication (HCC)     fasting cbg 110s  . Anxiety   . Headache(784.0)   . PONV (postoperative nausea and vomiting)     Patient stated "I like the patch behind my ear"  . UTI (lower urinary tract infection)   . HOH (hard of hearing)     HOH in left ear; needs to speak to patient in right ear   Past Surgical History  Procedure Laterality Date  . Anterior posterior and enterocele repairs  09/21/2004    With uterosacral cardinal colposuspension, partial colpocleisis; Selinda Orion, MD  . Breast enhancement surgery  02/25/2002    Bilateral reduction and excision of accessory breast tissue underneath the left breast; Aretha Parrot., MD  . Tonsillectomy and adenoidectomy    . Abdominal hysterectomy    . Cataract extraction Bilateral   . Rotator cuff repair Bilateral     11.12  . Back surgery    . Lumbar laminectomy/ decompression with met-rx N/A 03/16/2014    Procedure: LUMBAR FIVE-SACRAL ONE EXTRAFORAMINAL DISKECTOMY WITH METREX;  Surgeon: Kristeen Miss, MD;  Location: [place] [facility];  Service: Neurosurgery;  Laterality: N/A;  . Lumbar laminectomy/decompression microdiscectomy Left 06/08/2014    Procedure: Left Lumbar Five-Sacral One Microdiskectomy;  Surgeon: Kristeen Miss, MD;  Location: [place] [facility];  Service: Neurosurgery;  Laterality: Left;  Left L5-S1 Microdiskectomy  . Breast surgery  11    reduction  . Eye surgery Bilateral     cataract removal  . Colonoscopy    . Pelvic floor repair    . Cardiac catheterization      no PCI  . Posterior lumbar fusion 4 level N/A 08/08/2015    Procedure: T8-L1 posterior lateral fusion with decompression T12-L1;  Surgeon: Kristeen Miss, MD;  Location: [place] [facility];  Service: Neurosurgery;  Laterality: N/A;  T8-L1 posterior lateral fusion with decompression T12-L1    reports that she has never smoked. She has never used smokeless tobacco. She reports that she drinks alcohol. She reports that she does not use illicit drugs. family history includes Arthritis in her father and mother; Heart disease in her father; Heart failure in her father; Hyperlipidemia in  her father and mother; Hypertension in her mother. Allergies  Allergen Reactions  . Chlorhexidine Gluconate [Chlorhexidine] Itching  . Codeine     Facial swelling  . Cortisone     Insomnia, heart palpitations (po only)  . Statins Other (See Comments)    Myalgias   . Sulfonamide Derivatives     As a child      Review of Systems  Constitutional: Positive for fatigue. Negative for fever and chills.  HENT: Positive for congestion, postnasal drip and sinus pressure.   Respiratory: Positive for cough. Negative for  shortness of breath and wheezing.   Cardiovascular: Negative for chest pain.  Neurological: Positive for headaches.  Psychiatric/Behavioral: Positive for sleep disturbance. Negative for dysphoric mood. The patient is nervous/anxious.        Objective:   Physical Exam  Constitutional: She appears well-developed and well-nourished.  HENT:  Mouth/Throat: Oropharynx is clear and moist.  Thick yellow blood tinged mucous right naris and to a lesser extent left naris  Neck: Neck supple.  Cardiovascular: Normal rate and regular rhythm.   Pulmonary/Chest: Effort normal and breath sounds normal. No respiratory distress. She has no wheezes. She has no rales.  Lymphadenopathy:    She has no cervical adenopathy.          Assessment & Plan:  #1 Acute conjunctivitis left eye probably bacterial. Tobrex eyedrops every 4 hours while awake  #2 acute sinusitis. Likely pansinusitis involving frontal and maxillary sinuses bilaterally. Doxycycline 100 mg twice a day for 10 days. Suggest over-the-counter Mucinex. Stay well-hydrated.  #3 history of recurrent/chronic depression. Refill Celexa. Refill clonazepam for as needed use at night  Eulas Post MD [facility] at [facility]

## 2016-01-02 NOTE — Patient Instructions (Signed)

## 2016-01-02 NOTE — Progress Notes (Signed)
Pre visit review using our clinic tool,if applicable. No additional management support is needed unless otherwise documented below in the visit note.  

## 2016-01-18 ENCOUNTER — Other Ambulatory Visit: Payer: Self-pay | Admitting: Family Medicine

## 2016-01-19 DIAGNOSIS — Z961 Presence of intraocular lens: Secondary | ICD-10-CM | POA: Diagnosis not present

## 2016-01-19 DIAGNOSIS — E119 Type 2 diabetes mellitus without complications: Secondary | ICD-10-CM | POA: Diagnosis not present

## 2016-01-19 LAB — HM DIABETES EYE EXAM

## 2016-01-26 ENCOUNTER — Encounter: Payer: Self-pay | Admitting: Family Medicine

## 2016-01-30 ENCOUNTER — Other Ambulatory Visit: Payer: Medicare Other

## 2016-02-06 ENCOUNTER — Other Ambulatory Visit: Payer: Self-pay | Admitting: Family Medicine

## 2016-02-07 NOTE — Telephone Encounter (Signed)
I called the pharmacy and spoke with Aaron Edelman and he stated to have the pt bring in the Rx as they do not have it there.  I called the pt and informed her of this and she stated she does not have the Rx as she gave this to the pharmacy when she got this filled the first time.  I called the pharmacy back and informed Aaron Edelman of this and he stated he will figure this out and contact the pt.

## 2016-02-07 NOTE — Telephone Encounter (Signed)
Looks like this was filled for 6 months on 01-02-16?

## 2016-02-15 DIAGNOSIS — M546 Pain in thoracic spine: Secondary | ICD-10-CM | POA: Diagnosis not present

## 2016-02-16 ENCOUNTER — Telehealth: Payer: Self-pay | Admitting: Family Medicine

## 2016-02-16 NOTE — Telephone Encounter (Addendum)
Pt would like to know if she should see endocrinologist or nutrition for dm. Pt is having wt loss.

## 2016-02-17 NOTE — Telephone Encounter (Signed)
We can refer, but her last A1C was 6.3%- so, unless her diabetes has worsened significantly since then not sure her diabetes is accounting for her weight loss.  Would consider follow up here first so we can look at additional screening re: her weight loss.

## 2016-02-17 NOTE — Telephone Encounter (Signed)
Okay to refer patient?

## 2016-02-17 NOTE — Telephone Encounter (Signed)
Pt has a pending appt on 02/20/16 to discuss here at the office.

## 2016-02-20 ENCOUNTER — Ambulatory Visit: Payer: Medicare Other | Admitting: Family Medicine

## 2016-04-02 ENCOUNTER — Telehealth: Payer: Self-pay | Admitting: Family Medicine

## 2016-04-02 ENCOUNTER — Ambulatory Visit (INDEPENDENT_AMBULATORY_CARE_PROVIDER_SITE_OTHER): Payer: Medicare Other | Admitting: Family Medicine

## 2016-04-02 VITALS — BP 128/78 | HR 98 | Temp 97.8°F | Ht 66.0 in | Wt 129.9 lb

## 2016-04-02 DIAGNOSIS — E119 Type 2 diabetes mellitus without complications: Secondary | ICD-10-CM | POA: Diagnosis not present

## 2016-04-02 DIAGNOSIS — R8290 Unspecified abnormal findings in urine: Secondary | ICD-10-CM

## 2016-04-02 DIAGNOSIS — R300 Dysuria: Secondary | ICD-10-CM | POA: Diagnosis not present

## 2016-04-02 LAB — POC URINALSYSI DIPSTICK (AUTOMATED)
NITRITE UA: POSITIVE
PH UA: 5
Spec Grav, UA: 1.025
UROBILINOGEN UA: 1

## 2016-04-02 MED ORDER — FOSFOMYCIN TROMETHAMINE 3 G PO PACK: 3.0000 g | PACK | Freq: Once | ORAL | Status: DC

## 2016-04-02 MED ORDER — FOSFOMYCIN TROMETHAMINE 3 G PO PACK: PACK | ORAL | 0 refills | Status: DC

## 2016-04-02 NOTE — Telephone Encounter (Signed)
Pt swa Dr Elease Hashimoto today.  Pt was prescribed  fosfomycin (MONUROL) packet 3 g  However, it is not at the pharmacy. Can you resend to [facility]  Pt needs asap

## 2016-04-02 NOTE — Telephone Encounter (Signed)
Medication sent in for patient. 

## 2016-04-02 NOTE — Progress Notes (Signed)
Subjective:     Patient ID: [name], female   DOB: 01-09-1934, 82 y.o.   MRN: [id number]  HPI Patient seen with 3 day history of dysuria. Back in April she had pseudomonas UTI which eventually cleared with fosfomycin This was resistant to Cipro and levofloxacin. She has allergy to sulfa drugs. She describes urinary urgency and burning with onset this past Friday. No fevers or chills. Increased fatigue. She has some low back pain. No nausea or vomiting.  Type 2 diabetes. She's been increasing her overall calorie take. Fasting blood sugars usually run 120. Last A1c 6.3%. She did not bring a log of blood sugars today.  Past Medical History:  Diagnosis Date  . Allergy   . Anxiety   . Arthritis   . Atrial enlargement, left   . Depression   . Diabetes mellitus without complication (HCC)    fasting cbg 110s  . GERD (gastroesophageal reflux disease)   . H/O hiatal hernia   . Headache(784.0)   . Heart murmur    06/10/2014 seeing new cardiologist  . Heart murmur   . HOH (hard of hearing)    HOH in left ear; needs to speak to patient in right ear  . Hyperlipidemia   . Hypertension   . Mitral regurgitation   . Mitral valve prolapse   . Palpitations   . PONV (postoperative nausea and vomiting)    Patient stated "I like the patch behind my ear"  . 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 UTI (lower urinary tract infection)    Past Surgical History:  Procedure Laterality Date  . ABDOMINAL HYSTERECTOMY    . Anterior posterior and enterocele repairs  09/21/2004   With uterosacral cardinal colposuspension, partial colpocleisis; Selinda Orion, MD  . BACK SURGERY    . BREAST ENHANCEMENT SURGERY  02/25/2002   Bilateral reduction and excision of accessory breast tissue underneath the left breast; Aretha Parrot., MD  . BREAST SURGERY  11   reduction  . CARDIAC CATHETERIZATION     no PCI  . CATARACT EXTRACTION Bilateral   . COLONOSCOPY    . EYE SURGERY Bilateral    cataract removal  . LUMBAR  LAMINECTOMY/ DECOMPRESSION WITH MET-RX N/A 03/16/2014   Procedure: LUMBAR FIVE-SACRAL ONE EXTRAFORAMINAL DISKECTOMY WITH METREX;  Surgeon: Kristeen Miss, MD;  Location: [place] [facility];  Service: Neurosurgery;  Laterality: N/A;  . LUMBAR LAMINECTOMY/DECOMPRESSION MICRODISCECTOMY Left 06/08/2014   Procedure: Left Lumbar Five-Sacral One Microdiskectomy;  Surgeon: Kristeen Miss, MD;  Location: [place] [facility];  Service: Neurosurgery;  Laterality: Left;  Left L5-S1 Microdiskectomy  . PELVIC FLOOR REPAIR    . POSTERIOR LUMBAR FUSION 4 LEVEL N/A 08/08/2015   Procedure: T8-L1 posterior lateral fusion with decompression T12-L1;  Surgeon: Kristeen Miss, MD;  Location: [place] [facility];  Service: Neurosurgery;  Laterality: N/A;  T8-L1 posterior lateral fusion with decompression T12-L1  . ROTATOR CUFF REPAIR Bilateral    11.12  . TONSILLECTOMY AND ADENOIDECTOMY      reports that she has never smoked. She has never used smokeless tobacco. She reports that she drinks alcohol. She reports that she does not use drugs. family history includes Arthritis in her father and mother; Heart disease in her father; Heart failure in her father; Hyperlipidemia in her father and mother; Hypertension in her mother. Allergies  Allergen Reactions  . Chlorhexidine Gluconate [Chlorhexidine] Itching  . Codeine     Facial swelling  . Cortisone     Insomnia, heart palpitations (po only)  .  Statins Other (See Comments)    Myalgias   . Sulfonamide Derivatives     As a child     Review of Systems  Constitutional: Positive for fatigue. Negative for appetite change, chills and fever.  Gastrointestinal: Negative for abdominal pain, constipation, diarrhea, nausea and vomiting.  Endocrine: Negative for polydipsia.  Genitourinary: Positive for dysuria and frequency.  Musculoskeletal: Negative for back pain.  Neurological: Negative for dizziness.       Objective:   Physical Exam  Constitutional: She appears well-developed and  well-nourished.  HENT:  Mouth/Throat: Oropharynx is clear and moist.  Neck: Neck supple.  Cardiovascular: Normal rate and regular rhythm.   Pulmonary/Chest: Effort normal and breath sounds normal. No respiratory distress. She has no wheezes. She has no rales.  Musculoskeletal: She exhibits no edema.  Neurological: She is alert.       Assessment:     #1 dysuria. Suspect recurrent UTI. Patient took Azo-Standard today so urine dipstick will not be very readable  #2 type 2 diabetes. History of good control.    Plan:     -Urine culture sent. -Increase hydration. -History of resistant UTI in the past with Pseudomonas. We discussed going ahead and starting fosfomycin -Recheck A1c  Eulas Post MD [facility] at [facility]

## 2016-04-02 NOTE — Patient Instructions (Signed)

## 2016-04-03 LAB — POCT GLYCOSYLATED HEMOGLOBIN (HGB A1C): HEMOGLOBIN A1C: 6.3

## 2016-04-04 ENCOUNTER — Telehealth: Payer: Self-pay | Admitting: Family Medicine

## 2016-04-04 LAB — URINE CULTURE: Colony Count: >=100000

## 2016-04-04 NOTE — Telephone Encounter (Signed)
Please review

## 2016-04-04 NOTE — Telephone Encounter (Signed)
Final cx pending. We will let her know as soon as available.

## 2016-04-04 NOTE — Telephone Encounter (Signed)
Pt would like ua results  °

## 2016-04-04 NOTE — Telephone Encounter (Signed)
Pt is aware that culture is pending.

## 2016-04-06 ENCOUNTER — Telehealth: Payer: Self-pay | Admitting: Family Medicine

## 2016-04-06 NOTE — Telephone Encounter (Signed)
Pt would like to know what kind of bacteria she has from the UTI.

## 2016-04-06 NOTE — Telephone Encounter (Signed)
Pt is aware results. She would like to know if there is anything else she can do to prevent the frequency of UTIs?

## 2016-04-08 NOTE — Telephone Encounter (Signed)
Mostly staying well hydrated and emptying her bladder as soon as she gets urge to go.

## 2016-04-09 NOTE — Telephone Encounter (Signed)
Pt is aware of annotations.  

## 2016-04-11 ENCOUNTER — Ambulatory Visit: Payer: Medicare Other | Admitting: Family Medicine

## 2016-04-23 ENCOUNTER — Ambulatory Visit (INDEPENDENT_AMBULATORY_CARE_PROVIDER_SITE_OTHER): Payer: Medicare Other | Admitting: Family Medicine

## 2016-04-23 VITALS — BP 110/70 | HR 93 | Temp 97.7°F | Ht 66.0 in | Wt 130.0 lb

## 2016-04-23 DIAGNOSIS — R300 Dysuria: Secondary | ICD-10-CM | POA: Diagnosis not present

## 2016-04-23 LAB — POCT URINALYSIS DIPSTICK
Bilirubin, UA: NEGATIVE
GLUCOSE UA: NEGATIVE
Ketones, UA: NEGATIVE
NITRITE UA: NEGATIVE
PH UA: 7
PROTEIN UA: NEGATIVE
RBC UA: NEGATIVE
Spec Grav, UA: 1.015
UROBILINOGEN UA: 1

## 2016-04-23 MED ORDER — CIPROFLOXACIN HCL 500 MG PO TABS: 500.0000 mg | ORAL_TABLET | Freq: Two times a day (BID) | ORAL | 0 refills | Status: DC

## 2016-04-23 NOTE — Progress Notes (Signed)
Pre visit review using our clinic review tool, if applicable. No additional management support is needed unless otherwise documented below in the visit note. 

## 2016-04-23 NOTE — Patient Instructions (Signed)

## 2016-04-23 NOTE — Progress Notes (Signed)
Subjective:     Patient ID: [name], female   DOB: 08/25/1934, 82 y.o.   MRN: [id number]  HPI Patient seen with recurrent dysuria. History of frequent UTIs in the past including Klebsiella few weeks ago and prior to that Pseudomonas. No recent instrumentation of bladder. She has allergy to sulfa. Developed frequency and burning a few days ago. No fevers or chills. No flank pain. No nausea or vomiting. Increased malaise  Past Medical History:  Diagnosis Date  . Allergy   . Anxiety   . Arthritis   . Atrial enlargement, left   . Depression   . Diabetes mellitus without complication (HCC)    fasting cbg 110s  . GERD (gastroesophageal reflux disease)   . H/O hiatal hernia   . Headache(784.0)   . Heart murmur    06/10/2014 seeing new cardiologist  . Heart murmur   . HOH (hard of hearing)    HOH in left ear; needs to speak to patient in right ear  . Hyperlipidemia   . Hypertension   . Mitral regurgitation   . Mitral valve prolapse   . Palpitations   . PONV (postoperative nausea and vomiting)    Patient stated "I like the patch behind my ear"  . 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 UTI (lower urinary tract infection)    Past Surgical History:  Procedure Laterality Date  . ABDOMINAL HYSTERECTOMY    . Anterior posterior and enterocele repairs  09/21/2004   With uterosacral cardinal colposuspension, partial colpocleisis; Selinda Orion, MD  . BACK SURGERY    . BREAST ENHANCEMENT SURGERY  02/25/2002   Bilateral reduction and excision of accessory breast tissue underneath the left breast; Aretha Parrot., MD  . BREAST SURGERY  11   reduction  . CARDIAC CATHETERIZATION     no PCI  . CATARACT EXTRACTION Bilateral   . COLONOSCOPY    . EYE SURGERY Bilateral    cataract removal  . LUMBAR LAMINECTOMY/ DECOMPRESSION WITH MET-RX N/A 03/16/2014   Procedure: LUMBAR FIVE-SACRAL ONE EXTRAFORAMINAL DISKECTOMY WITH METREX;  Surgeon: Kristeen Miss, MD;  Location: [place] [facility];  Service:  Neurosurgery;  Laterality: N/A;  . LUMBAR LAMINECTOMY/DECOMPRESSION MICRODISCECTOMY Left 06/08/2014   Procedure: Left Lumbar Five-Sacral One Microdiskectomy;  Surgeon: Kristeen Miss, MD;  Location: [place] [facility];  Service: Neurosurgery;  Laterality: Left;  Left L5-S1 Microdiskectomy  . PELVIC FLOOR REPAIR    . POSTERIOR LUMBAR FUSION 4 LEVEL N/A 08/08/2015   Procedure: T8-L1 posterior lateral fusion with decompression T12-L1;  Surgeon: Kristeen Miss, MD;  Location: [place] [facility];  Service: Neurosurgery;  Laterality: N/A;  T8-L1 posterior lateral fusion with decompression T12-L1  . ROTATOR CUFF REPAIR Bilateral    11.12  . TONSILLECTOMY AND ADENOIDECTOMY      reports that she has never smoked. She has never used smokeless tobacco. She reports that she drinks alcohol. She reports that she does not use drugs. family history includes Arthritis in her father and mother; Heart disease in her father; Heart failure in her father; Hyperlipidemia in her father and mother; Hypertension in her mother. Allergies  Allergen Reactions  . Chlorhexidine Gluconate [Chlorhexidine] Itching  . Codeine     Facial swelling  . Cortisone     Insomnia, heart palpitations (po only)  . Statins Other (See Comments)    Myalgias   . Sulfonamide Derivatives     As a child     Review of Systems  Constitutional: Negative for appetite change, chills and fever.  Gastrointestinal: Negative for abdominal pain, constipation, diarrhea, nausea and vomiting.  Genitourinary: Positive for dysuria and frequency. Negative for hematuria.  Musculoskeletal: Negative for back pain.  Neurological: Negative for dizziness.       Objective:   Physical Exam  Constitutional: She appears well-developed and well-nourished. No distress.  HENT:  Mouth/Throat: Oropharynx is clear and moist.  Cardiovascular: Normal rate and regular rhythm.   Pulmonary/Chest: Effort normal and breath sounds normal. No respiratory distress. She has no wheezes.  She has no rales.  Neurological: She is alert.       Assessment:     Dysuria. Rule out UTI    Plan:     -Urine culture sent -Cipro 500 mg twice a day for 7 days pending culture results -Consider prophylaxis with trimethoprim 100 mg once daily following treatment of current episode -Stay well-hydrated  Eulas Post MD [facility] at [facility]

## 2016-04-25 LAB — URINE CULTURE: Colony Count: >=100000

## 2016-04-26 MED ORDER — TRIMETHOPRIM 100 MG PO TABS: 100.0000 mg | ORAL_TABLET | Freq: Every day | ORAL | 5 refills | Status: DC

## 2016-04-26 NOTE — Addendum Note (Signed)
Addended by: Agnes Lawrence on: 04/26/2016 06:10 PM   Modules accepted: Orders

## 2016-04-26 NOTE — Progress Notes (Signed)
Routed message to Dr Elease Hashimoto : [name]  After hearing lab results requested to speak to Dr Elease Hashimoto & then asked for him to call her tomorrow.  Urine cx positive for Enterococcus. Should be covered with Cipro. I would go ahead and send in RX for Trimethoprim 100 mg one po daily (# 30 with 5 refills) to start AFTER she finishes current antibiotic. We are going to try this for the next few months as prophylactic given her number of UTIs.

## 2016-04-27 ENCOUNTER — Telehealth: Payer: Self-pay | Admitting: Family Medicine

## 2016-04-27 NOTE — Telephone Encounter (Signed)
Spoke with patient and answered her questions.  She is feeling some better.

## 2016-04-27 NOTE — Telephone Encounter (Signed)
°  Pt call to ask if DR Burchette will call her. She said she is getting conflicting information. She seems to think that only Dr Elease Hashimoto can answer her question. She wants to know if this is a new bacteria or an original.

## 2016-05-28 ENCOUNTER — Other Ambulatory Visit: Payer: Self-pay | Admitting: Internal Medicine

## 2016-05-28 DIAGNOSIS — I10 Essential (primary) hypertension: Secondary | ICD-10-CM

## 2016-05-30 ENCOUNTER — Other Ambulatory Visit: Payer: Self-pay | Admitting: Family Medicine

## 2016-06-26 ENCOUNTER — Ambulatory Visit (INDEPENDENT_AMBULATORY_CARE_PROVIDER_SITE_OTHER): Payer: Medicare Other | Admitting: Family Medicine

## 2016-06-26 VITALS — BP 120/80 | HR 117 | Temp 97.5°F | Ht 66.0 in | Wt 128.1 lb

## 2016-06-26 DIAGNOSIS — J0140 Acute pansinusitis, unspecified: Secondary | ICD-10-CM

## 2016-06-26 DIAGNOSIS — F5104 Psychophysiologic insomnia: Secondary | ICD-10-CM

## 2016-06-26 MED ORDER — DOXYCYCLINE HYCLATE 100 MG PO CAPS: 100.0000 mg | ORAL_CAPSULE | Freq: Two times a day (BID) | ORAL | 0 refills | Status: DC

## 2016-06-26 MED ORDER — FLUTICASONE PROPIONATE 50 MCG/ACT NA SUSP: NASAL | 1 refills | Status: DC

## 2016-06-26 MED ORDER — CLONAZEPAM 1 MG PO TABS: ORAL_TABLET | ORAL | 5 refills | Status: DC

## 2016-06-26 NOTE — Patient Instructions (Signed)

## 2016-06-26 NOTE — Progress Notes (Signed)
Pre visit review using our clinic review tool, if applicable. No additional management support is needed unless otherwise documented below in the visit note. 

## 2016-06-26 NOTE — Progress Notes (Signed)
Subjective:     Patient ID: [name], female   DOB: [birth year], 82 y.o.   MRN: [id number]  HPI Patient seen with three-week history of intermittent headache. She also has had eye lateral maxillary and frontal sinus pressure and pain. She's had some postnasal drip symptoms. Occasional cough. No nosebleed. Symptoms are worse supine. She's also had increased fatigue and malaise. Occasional upper teeth pain. Has taken Claritin-D without relief.  She has previous intolerance with Augmentin and allergy to sulfa. Still needs flu vaccine. Requesting refill of Klonopin. She's been on this many years 1 mg daily at bedtime.  Past Medical History:  Diagnosis Date  . Allergy   . Anxiety   . Arthritis   . Atrial enlargement, left   . Depression   . Diabetes mellitus without complication (HCC)    fasting cbg 110s  . GERD (gastroesophageal reflux disease)   . H/O hiatal hernia   . Headache(784.0)   . Heart murmur    06/10/2014 seeing new cardiologist  . Heart murmur   . HOH (hard of hearing)    HOH in left ear; needs to speak to patient in right ear  . Hyperlipidemia   . Hypertension   . Mitral regurgitation   . Mitral valve prolapse   . Palpitations   . PONV (postoperative nausea and vomiting)    Patient stated "I like the patch behind my ear"  . 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 UTI (lower urinary tract infection)    Past Surgical History:  Procedure Laterality Date  . ABDOMINAL HYSTERECTOMY    . Anterior posterior and enterocele repairs  09/21/2004   With uterosacral cardinal colposuspension, partial colpocleisis; Selinda Orion, MD  . BACK SURGERY    . BREAST ENHANCEMENT SURGERY  02/25/2002   Bilateral reduction and excision of accessory breast tissue underneath the left breast; Aretha Parrot., MD  . BREAST SURGERY  11   reduction  . CARDIAC CATHETERIZATION     no PCI  . CATARACT EXTRACTION Bilateral   . COLONOSCOPY    . EYE SURGERY Bilateral    cataract removal  . LUMBAR  LAMINECTOMY/ DECOMPRESSION WITH MET-RX N/A 03/16/2014   Procedure: LUMBAR FIVE-SACRAL ONE EXTRAFORAMINAL DISKECTOMY WITH METREX;  Surgeon: Kristeen Miss, MD;  Location: [place] [facility];  Service: Neurosurgery;  Laterality: N/A;  . LUMBAR LAMINECTOMY/DECOMPRESSION MICRODISCECTOMY Left 06/08/2014   Procedure: Left Lumbar Five-Sacral One Microdiskectomy;  Surgeon: Kristeen Miss, MD;  Location: [place] [facility];  Service: Neurosurgery;  Laterality: Left;  Left L5-S1 Microdiskectomy  . PELVIC FLOOR REPAIR    . POSTERIOR LUMBAR FUSION 4 LEVEL N/A 08/08/2015   Procedure: T8-L1 posterior lateral fusion with decompression T12-L1;  Surgeon: Kristeen Miss, MD;  Location: [place] [facility];  Service: Neurosurgery;  Laterality: N/A;  T8-L1 posterior lateral fusion with decompression T12-L1  . ROTATOR CUFF REPAIR Bilateral    11.12  . TONSILLECTOMY AND ADENOIDECTOMY      reports that she has never smoked. She has never used smokeless tobacco. She reports that she drinks alcohol. She reports that she does not use drugs. family history includes Arthritis in her father and mother; Heart disease in her father; Heart failure in her father; Hyperlipidemia in her father and mother; Hypertension in her mother. Allergies  Allergen Reactions  . Chlorhexidine Gluconate [Chlorhexidine] Itching  . Codeine     Facial swelling  . Cortisone     Insomnia, heart palpitations (po only)  . Statins Other (See Comments)    Myalgias   .  Sulfonamide Derivatives     As a child     Review of Systems  Constitutional: Positive for fatigue. Negative for chills and fever.  HENT: Positive for congestion, postnasal drip and sinus pressure.   Respiratory: Positive for cough. Negative for shortness of breath.   Neurological: Positive for headaches.       Objective:   Physical Exam  Constitutional: She appears well-developed and well-nourished.  HENT:  Right Ear: External ear normal.  Left Ear: External ear normal.  Mouth/Throat: Oropharynx  is clear and moist.  Erythematous nasal mucosa bilaterally  Neck: Neck supple.  Cardiovascular: Normal rate and regular rhythm.   Pulmonary/Chest: Effort normal and breath sounds normal. No respiratory distress. She has no wheezes. She has no rales.  Lymphadenopathy:    She has no cervical adenopathy.       Assessment:     #1 probable acute pansinusitis  #2 chronic insomnia    Plan:     -Doxycycline 100 mg twice daily for 10 days -Touch base if symptoms not improved in one week -Stay well-hydrated -Flu vaccine given per patient request and we agreed since she is afebrile -Refill clonazepam 1 mg daily at bedtime when necessary #30 with 5 refill  Eulas Post MD [facility] at [facility]

## 2016-07-11 ENCOUNTER — Other Ambulatory Visit: Payer: Self-pay | Admitting: Family Medicine

## 2016-07-11 DIAGNOSIS — Z1231 Encounter for screening mammogram for malignant neoplasm of breast: Secondary | ICD-10-CM

## 2016-07-18 DIAGNOSIS — M546 Pain in thoracic spine: Secondary | ICD-10-CM | POA: Diagnosis not present

## 2016-07-18 DIAGNOSIS — M5412 Radiculopathy, cervical region: Secondary | ICD-10-CM | POA: Diagnosis not present

## 2016-07-27 ENCOUNTER — Ambulatory Visit
Admission: RE | Admit: 2016-07-27 | Discharge: 2016-07-27 | Disposition: A | Discharge status: Home / Self Care | Payer: Medicare Other | Source: Ambulatory Visit | Attending: Family Medicine | Admitting: Family Medicine

## 2016-07-27 DIAGNOSIS — Z1231 Encounter for screening mammogram for malignant neoplasm of breast: Secondary | ICD-10-CM | POA: Diagnosis not present

## 2016-08-14 DIAGNOSIS — M542 Cervicalgia: Secondary | ICD-10-CM | POA: Diagnosis not present

## 2016-08-14 DIAGNOSIS — M791 Myalgia: Secondary | ICD-10-CM | POA: Diagnosis not present

## 2016-08-21 ENCOUNTER — Telehealth: Payer: Self-pay | Admitting: Family Medicine

## 2016-08-21 ENCOUNTER — Ambulatory Visit (INDEPENDENT_AMBULATORY_CARE_PROVIDER_SITE_OTHER): Payer: Medicare Other

## 2016-08-21 VITALS — BP 152/80 | HR 98 | Ht 66.5 in | Wt 127.0 lb

## 2016-08-21 DIAGNOSIS — Z0000 Encounter for general adult medical examination without abnormal findings: Secondary | ICD-10-CM

## 2016-08-21 DIAGNOSIS — E119 Type 2 diabetes mellitus without complications: Secondary | ICD-10-CM

## 2016-08-21 NOTE — Telephone Encounter (Signed)
Please advise.  Okay to put in order?

## 2016-08-21 NOTE — Patient Instructions (Addendum)
[name] , Thank you for taking time to come for your Medicare Wellness Visit. I appreciate your ongoing commitment to your health goals. Please review the following plan we discussed and let me know if I can assist you in the future.   Educated to check with insurance regarding coverage of Shingles vaccination on Part D or Part B and may have lower co-pay if provided on the Part D side There is a new shingles vaccine.   Had pneumonia vaccine x 9 to 10 years ago and documented  Had flu vaccine in 06/2016 and documented   [facility]   rec'd copy of 2 AD   These are the goals we discussed: Goals    . Exercise 150 minutes per week (moderate activity)          Will start silver sneakers in January        This is a list of the screening recommended for you and due dates:  Health Maintenance  Topic Date Due  . Shingles Vaccine  11/19/1995  . Complete foot exam   04/07/2014  . Pneumonia vaccines (2 of 2 - PPSV23) 05/22/2015  . Flu Shot  04/10/2016  . Hemoglobin A1C  10/04/2016  . Eye exam for diabetics  01/18/2017  . Tetanus Vaccine  06/09/2018  . DEXA scan (bone density measurement)  Completed        Fall Prevention in the Home Introduction Falls can cause injuries. They can happen to people of all ages. There are many things you can do to make your home safe and to help prevent falls. What can I do on the outside of my home?  Regularly fix the edges of walkways and driveways and fix any cracks.  Remove anything that might make you trip as you walk through a door, such as a raised step or threshold.  Trim any bushes or trees on the path to your home.  Use bright outdoor lighting.  Clear any walking paths of anything that might make someone trip, such as rocks or tools.  Regularly check to see if handrails are loose or broken. Make sure that both sides of any steps have handrails.  Any raised decks  and porches should have guardrails on the edges.  Have any leaves, snow, or ice cleared regularly.  Use sand or salt on walking paths during winter.  Clean up any spills in your garage right away. This includes oil or grease spills. What can I do in the bathroom?  Use night lights.  Install grab bars by the toilet and in the tub and shower. Do not use towel bars as grab bars.  Use non-skid mats or decals in the tub or shower.  If you need to sit down in the shower, use a plastic, non-slip stool.  Keep the floor dry. Clean up any water that spills on the floor as soon as it happens.  Remove soap buildup in the tub or shower regularly.  Attach bath mats securely with double-sided non-slip rug tape.  Do not have throw rugs and other things on the floor that can make you trip. What can I do in the bedroom?  Use night lights.  Make sure that you have a light by your bed that is easy to reach.  Do not use any sheets or blankets that are too big for your bed. They should not hang down onto the floor.  Have a firm chair  that has side arms. You can use this for support while you get dressed.  Do not have throw rugs and other things on the floor that can make you trip. What can I do in the kitchen?  Clean up any spills right away.  Avoid walking on wet floors.  Keep items that you use a lot in easy-to-reach places.  If you need to reach something above you, use a strong step stool that has a grab bar.  Keep electrical cords out of the way.  Do not use floor polish or wax that makes floors slippery. If you must use wax, use non-skid floor wax.  Do not have throw rugs and other things on the floor that can make you trip. What can I do with my stairs?  Do not leave any items on the stairs.  Make sure that there are handrails on both sides of the stairs and use them. Fix handrails that are broken or loose. Make sure that handrails are as long as the stairways.  Check any  carpeting to make sure that it is firmly attached to the stairs. Fix any carpet that is loose or worn.  Avoid having throw rugs at the top or bottom of the stairs. If you do have throw rugs, attach them to the floor with carpet tape.  Make sure that you have a light switch at the top of the stairs and the bottom of the stairs. If you do not have them, ask someone to add them for you. What else can I do to help prevent falls?  Wear shoes that:  Do not have high heels.  Have rubber bottoms.  Are comfortable and fit you well.  Are closed at the toe. Do not wear sandals.  If you use a stepladder:  Make sure that it is fully opened. Do not climb a closed stepladder.  Make sure that both sides of the stepladder are locked into place.  Ask someone to hold it for you, if possible.  Clearly mark and make sure that you can see:  Any grab bars or handrails.  First and last steps.  Where the edge of each step is.  Use tools that help you move around (mobility aids) if they are needed. These include:  Canes.  Walkers.  Scooters.  Crutches.  Turn on the lights when you go into a dark area. Replace any light bulbs as soon as they burn out.  Set up your furniture so you have a clear path. Avoid moving your furniture around.  If any of your floors are uneven, fix them.  If there are any pets around you, be aware of where they are.  Review your medicines with your doctor. Some medicines can make you feel dizzy. This can increase your chance of falling. Ask your doctor what other things that you can do to help prevent falls. This information is not intended to replace advice given to you by your health care provider. Make sure you discuss any questions you have with your health care provider. Document Released: 06/23/2009 Document Revised: 02/02/2016 Document Reviewed: 10/01/2014  2017 [employer]  Health Maintenance, Female Introduction Adopting a healthy lifestyle and getting  preventive care can go a long way to promote health and wellness. Talk with your health care provider about what schedule of regular examinations is right for you. This is a good chance for you to check in with your provider about disease prevention and staying healthy. In between checkups, there are plenty  of things you can do on your own. Experts have done a lot of research about which lifestyle changes and preventive measures are most likely to keep you healthy. Ask your health care provider for more information. Weight and diet Eat a healthy diet  Be sure to include plenty of vegetables, fruits, low-fat dairy products, and lean protein.  Do not eat a lot of foods high in solid fats, added sugars, or salt.  Get regular exercise. This is one of the most important things you can do for your health.  Most adults should exercise for at least 150 minutes each week. The exercise should increase your heart rate and make you sweat (moderate-intensity exercise).  Most adults should also do strengthening exercises at least twice a week. This is in addition to the moderate-intensity exercise. Maintain a healthy weight  Body mass index (BMI) is a measurement that can be used to identify possible weight problems. It estimates body fat based on height and weight. Your health care provider can help determine your BMI and help you achieve or maintain a healthy weight.  For females 70 years of age and older:  A BMI below 18.5 is considered underweight.  A BMI of 18.5 to 24.9 is normal.  A BMI of 25 to 29.9 is considered overweight.  A BMI of 30 and above is considered obese. Watch levels of cholesterol and blood lipids  You should start having your blood tested for lipids and cholesterol at 80 years of age, then have this test every 5 years.  You may need to have your cholesterol levels checked more often if:  Your lipid or cholesterol levels are high.  You are older than 80 years of age.  You  are at high risk for heart disease. Cancer screening Lung Cancer  Lung cancer screening is recommended for adults 52-42 years old who are at high risk for lung cancer because of a history of smoking.  A yearly low-dose CT scan of the lungs is recommended for people who:  Currently smoke.  Have quit within the past 15 years.  Have at least a 30-pack-year history of smoking. A pack year is smoking an average of one pack of cigarettes a day for 1 year.  Yearly screening should continue until it has been 15 years since you quit.  Yearly screening should stop if you develop a health problem that would prevent you from having lung cancer treatment. Breast Cancer  Practice breast self-awareness. This means understanding how your breasts normally appear and feel.  It also means doing regular breast self-exams. Let your health care provider know about any changes, no matter how small.  If you are in your 20s or 30s, you should have a clinical breast exam (CBE) by a health care provider every 1-3 years as part of a regular health exam.  If you are 66 or older, have a CBE every year. Also consider having a breast X-ray (mammogram) every year.  If you have a family history of breast cancer, talk to your health care provider about genetic screening.  If you are at high risk for breast cancer, talk to your health care provider about having an MRI and a mammogram every year.  Breast cancer gene (BRCA) assessment is recommended for women who have family members with BRCA-related cancers. BRCA-related cancers include:  Breast.  Ovarian.  Tubal.  Peritoneal cancers.  Results of the assessment will determine the need for genetic counseling and BRCA1 and BRCA2 testing. Cervical Cancer  Your health care provider may recommend that you be screened regularly for cancer of the pelvic organs (ovaries, uterus, and vagina). This screening involves a pelvic examination, including checking for  microscopic changes to the surface of your cervix (Pap test). You may be encouraged to have this screening done every 3 years, beginning at age 75.  For women ages 104-65, health care providers may recommend pelvic exams and Pap testing every 3 years, or they may recommend the Pap and pelvic exam, combined with testing for human papilloma virus (HPV), every 5 years. Some types of HPV increase your risk of cervical cancer. Testing for HPV may also be done on women of any age with unclear Pap test results.  Other health care providers may not recommend any screening for nonpregnant women who are considered low risk for pelvic cancer and who do not have symptoms. Ask your health care provider if a screening pelvic exam is right for you.  If you have had past treatment for cervical cancer or a condition that could lead to cancer, you need Pap tests and screening for cancer for at least 20 years after your treatment. If Pap tests have been discontinued, your risk factors (such as having a new sexual partner) need to be reassessed to determine if screening should resume. Some women have medical problems that increase the chance of getting cervical cancer. In these cases, your health care provider may recommend more frequent screening and Pap tests. Colorectal Cancer  This type of cancer can be detected and often prevented.  Routine colorectal cancer screening usually begins at 80 years of age and continues through 80 years of age.  Your health care provider may recommend screening at an earlier age if you have risk factors for colon cancer.  Your health care provider may also recommend using home test kits to check for hidden blood in the stool.  A small camera at the end of a tube can be used to examine your colon directly (sigmoidoscopy or colonoscopy). This is done to check for the earliest forms of colorectal cancer.  Routine screening usually begins at age 57.  Direct examination of the colon  should be repeated every 5-10 years through 80 years of age. However, you may need to be screened more often if early forms of precancerous polyps or small growths are found. Skin Cancer  Check your skin from head to toe regularly.  Tell your health care provider about any new moles or changes in moles, especially if there is a change in a mole's shape or color.  Also tell your health care provider if you have a mole that is larger than the size of a pencil eraser.  Always use sunscreen. Apply sunscreen liberally and repeatedly throughout the day.  Protect yourself by wearing long sleeves, pants, a wide-brimmed hat, and sunglasses whenever you are outside. Heart disease, diabetes, and high blood pressure  High blood pressure causes heart disease and increases the risk of stroke. High blood pressure is more likely to develop in:  People who have blood pressure in the high end of the normal range (130-139/85-89 mm Hg).  People who are overweight or obese.  People who are African American.  If you are 71-28 years of age, have your blood pressure checked every 3-5 years. If you are 46 years of age or older, have your blood pressure checked every year. You should have your blood pressure measured twice-once when you are at a hospital or clinic, and once when  you are not at a hospital or clinic. Record the average of the two measurements. To check your blood pressure when you are not at a hospital or clinic, you can use:  An automated blood pressure machine at a pharmacy.  A home blood pressure monitor.  If you are between 70 years and 26 years old, ask your health care provider if you should take aspirin to prevent strokes.  Have regular diabetes screenings. This involves taking a blood sample to check your fasting blood sugar level.  If you are at a normal weight and have a low risk for diabetes, have this test once every three years after 80 years of age.  If you are overweight and  have a high risk for diabetes, consider being tested at a younger age or more often. Preventing infection Hepatitis B  If you have a higher risk for hepatitis B, you should be screened for this virus. You are considered at high risk for hepatitis B if:  You were born in a country where hepatitis B is common. Ask your health care provider which countries are considered high risk.  Your parents were born in a high-risk country, and you have not been immunized against hepatitis B (hepatitis B vaccine).  You have HIV or AIDS.  You use needles to inject street drugs.  You live with someone who has hepatitis B.  You have had sex with someone who has hepatitis B.  You get hemodialysis treatment.  You take certain medicines for conditions, including cancer, organ transplantation, and autoimmune conditions. Hepatitis C  Blood testing is recommended for:  Everyone born from [birth year] through 1963.  Anyone with known risk factors for hepatitis C. Sexually transmitted infections (STIs)  You should be screened for sexually transmitted infections (STIs) including gonorrhea and chlamydia if:  You are sexually active and are younger than 80 years of age.  You are older than 80 years of age and your health care provider tells you that you are at risk for this type of infection.  Your sexual activity has changed since you were last screened and you are at an increased risk for chlamydia or gonorrhea. Ask your health care provider if you are at risk.  If you do not have HIV, but are at risk, it may be recommended that you take a prescription medicine daily to prevent HIV infection. This is called pre-exposure prophylaxis (PrEP). You are considered at risk if:  You are sexually active and do not regularly use condoms or know the HIV status of your partner(s).  You take drugs by injection.  You are sexually active with a partner who has HIV. Talk with your health care provider about whether you  are at high risk of being infected with HIV. If you choose to begin PrEP, you should first be tested for HIV. You should then be tested every 3 months for as long as you are taking PrEP. Pregnancy  If you are premenopausal and you may become pregnant, ask your health care provider about preconception counseling.  If you may become pregnant, take 400 to 800 micrograms (mcg) of folic acid every day.  If you want to prevent pregnancy, talk to your health care provider about birth control (contraception). Osteoporosis and menopause  Osteoporosis is a disease in which the bones lose minerals and strength with aging. This can result in serious bone fractures. Your risk for osteoporosis can be identified using a bone density scan.  If you are 65 years of  age or older, or if you are at risk for osteoporosis and fractures, ask your health care provider if you should be screened.  Ask your health care provider whether you should take a calcium or vitamin D supplement to lower your risk for osteoporosis.  Menopause may have certain physical symptoms and risks.  Hormone replacement therapy may reduce some of these symptoms and risks. Talk to your health care provider about whether hormone replacement therapy is right for you. Follow these instructions at home:  Schedule regular health, dental, and eye exams.  Stay current with your immunizations.  Do not use any tobacco products including cigarettes, chewing tobacco, or electronic cigarettes.  If you are pregnant, do not drink alcohol.  If you are breastfeeding, limit how much and how often you drink alcohol.  Limit alcohol intake to no more than 1 drink per day for nonpregnant women. One drink equals 12 ounces of beer, 5 ounces of wine, or 1 ounces of hard liquor.  Do not use street drugs.  Do not share needles.  Ask your health care provider for help if you need support or information about quitting drugs.  Tell your health care  provider if you often feel depressed.  Tell your health care provider if you have ever been abused or do not feel safe at home. This information is not intended to replace advice given to you by your health care provider. Make sure you discuss any questions you have with your health care provider. Document Released: 03/12/2011 Document Revised: 02/02/2016 Document Reviewed: 05/31/2015  2017 [employer]

## 2016-08-21 NOTE — Telephone Encounter (Signed)
OK 

## 2016-08-21 NOTE — Telephone Encounter (Signed)
Pt saw [name] today and per pt needs to sch a1c. Can I sch?

## 2016-08-21 NOTE — Progress Notes (Addendum)
Subjective:   [name] is a 82 y.o. female who presents for Medicare Annual (Subsequent) preventive examination. The Patient was informed that the wellness visit is to identify future health risk and educate and initiate measures that can reduce risk for increased disease through the lifespan.    NO ROS; Medicare Wellness Visit  Describes health as good, fair or great?  fair  Preventive Screening -Counseling & Management  Colonoscopy 04/2012 Mammogram 07/2016 Dexa 08/31/1999  Current smoking/ tobacco status/ never smoked  Second Hand Smoke status; No Smokers in the home  ETOH stopped with recent dx of diabetes  RISK FACTORS Regular exercise  Agrees to go to the silver sneaker in January   Diet Can't gain weight Breakfast; cream of wheat; protein; not bacon; eggs Lunch; sandwich; salad;  Discussed adding honey; nuts; cheese and a little fat too diet She voices being very strict with diet and sugar  Afraid to eat any sugar; loves pasta but will not eat any Gets fat and sugar confused;   Weigh is up from 120  Discussed referral to diabetes and nutrition center at [facility] and agreed to go first of the year    Fall risk; no  Mobility of Functional changes this year? Has to have some help sometimes due to back; help with taking groceries to the car Dr. Ellene Route stated she was free to go and exercise   Safety; lived in present home 35 years 2 stories; bedroom on the first floor They have a tub; did have a chair in the tub;  Does not have issues getting in the tub    Cardiac Risk Factors:  Advanced aged  >60 in women Hyperlipidemia - chol 195; trig 141; HDL 52 and LDL 114 Diabetes newly dx A1c 6.3  Checks bs at home 137 fasting;  Family History ( mother had OA; chol; htn; Father had HF; OA, hyperlidemia;HD  Obesity neg   Eye exam 01/2016 Dr Gershon Crane    Hearing aids bilateral  Does not like to wear them Does not hear in left ear   Depression Screen PhQ 2:  negative  Activities of Daily Living - See functional screen   Cognitive testing; Ad8 score reviewed for issues:  Issues making decisions:  Less interest in hobbies / activities:  Repeats questions, stories (family complaining):  Trouble using ordinary gadgets (microwave, computer, phone):  Forgets the month or year:   Mismanaging finances: her and spouse   Remembering appts:  Daily problems with thinking and/or memory: Ad8 score is=0  Advanced Directives need to update Lost their son; one died of cancer x 2 years ago Was very ill x 3 years;  States this time of year is difficult but has church service and lighting candles at [employer] to take 2 AD from [facility] to update    No Patient Care Coordination Note on file. Patient Care Team: Eulas Post, MD as PCP - General (Family Medicine)    Immunization History  Administered Date(s) Administered  . Influenza Split 06/04/2012  . Influenza, High Dose Seasonal PF 06/20/2015  . Influenza,inj,Quad PF,36+ Mos 05/21/2014  . Pneumococcal Conjugate-13 05/21/2014   Required Immunizations needed today  Screening test up to date or reviewed for plan of completion Health Maintenance Due  Topic Date Due  . ZOSTAVAX  11/19/1995  . FOOT EXAM  04/07/2014  . PNA vac Low Risk Adult (2 of 2 - PPSV23) 05/22/2015  . INFLUENZA VACCINE  04/10/2016   Postponing shingles due to  expense; will check next year  Foot exam completed today   Flu vaccine;  States she had in October and this was documented in the MD note/ updated the record   PSV 23 discussed - stated she had PSV 23 and updated the record    Cardiac Risk Factors include: advanced age (>22mn, >>45women);diabetes mellitus;dyslipidemia;family history of premature cardiovascular disease;sedentary lifestyle     Objective:     Vitals: BP (!) 152/80   Pulse 98   Ht 5' 6.5" (1.689 m)   Wt 127 lb (57.6 kg)   SpO2 97%   BMI 20.19 kg/m   Body mass index is 20.19  kg/m.   Tobacco History  Smoking Status  . Never Smoker  Smokeless Tobacco  . Never Used     Counseling given: Yes   Past Medical History:  Diagnosis Date  . Allergy   . Anxiety   . Arthritis   . Atrial enlargement, left   . Depression   . Diabetes mellitus without complication (HCC)    fasting cbg 110s  . GERD (gastroesophageal reflux disease)   . H/O hiatal hernia   . Headache(784.0)   . Heart murmur    06/10/2014 seeing new cardiologist  . Heart murmur   . HOH (hard of hearing)    HOH in left ear; needs to speak to patient in right ear  . Hyperlipidemia   . Hypertension   . Mitral regurgitation   . Mitral valve prolapse   . Palpitations   . PONV (postoperative nausea and vomiting)    Patient stated "I like the patch behind my ear"  . 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 UTI (lower urinary tract infection)    Past Surgical History:  Procedure Laterality Date  . ABDOMINAL HYSTERECTOMY    . Anterior posterior and enterocele repairs  09/21/2004   With uterosacral cardinal colposuspension, partial colpocleisis; CSelinda Orion MD  . BACK SURGERY    . BREAST ENHANCEMENT SURGERY  02/25/2002   Bilateral reduction and excision of accessory breast tissue underneath the left breast; HAretha Parrot, MD  . BREAST SURGERY  11   reduction  . CARDIAC CATHETERIZATION     no PCI  . CATARACT EXTRACTION Bilateral   . COLONOSCOPY    . EYE SURGERY Bilateral    cataract removal  . LUMBAR LAMINECTOMY/ DECOMPRESSION WITH MET-RX N/A 03/16/2014   Procedure: LUMBAR FIVE-SACRAL ONE EXTRAFORAMINAL DISKECTOMY WITH METREX;  Surgeon: HKristeen Miss MD;  [facility];  Service: Neurosurgery;  Laterality: N/A;  . LUMBAR LAMINECTOMY/DECOMPRESSION MICRODISCECTOMY Left 06/08/2014   Procedure: Left Lumbar Five-Sacral One Microdiskectomy;  Surgeon: HKristeen Miss MD;  [facility];  Service: Neurosurgery;  Laterality: Left;  Left L5-S1 Microdiskectomy  . PELVIC FLOOR REPAIR    .  POSTERIOR LUMBAR FUSION 4 LEVEL N/A 08/08/2015   Procedure: T8-L1 posterior lateral fusion with decompression T12-L1;  Surgeon: HKristeen Miss MD;  [facility];  Service: Neurosurgery;  Laterality: N/A;  T8-L1 posterior lateral fusion with decompression T12-L1  . ROTATOR CUFF REPAIR Bilateral    11.12  . TONSILLECTOMY AND ADENOIDECTOMY     Family History  Problem Relation Age of Onset  . Heart failure Father   . Arthritis Father   . Hyperlipidemia Father   . Heart disease Father   . Arthritis Mother   . Hyperlipidemia Mother   . Hypertension Mother    History  Sexual Activity  . Sexual activity: Not on file  Outpatient Encounter Prescriptions as of 08/21/2016  Medication Sig  . clonazePAM (KLONOPIN) 1 MG tablet Take one po qhs prn insomnia and severe anxiety. May refill on or after Jan 09 2016.  . diltiazem (DILACOR XR) 240 MG 24 hr capsule Take 1 capsule (240 mg total) by mouth daily.  . fluticasone (FLONASE) 50 MCG/ACT nasal spray SHAKE LIQUID AND USE 2 SPRAYS IN EACH NOSTRIL DAILY AS NEEDED FOR ALLERGIES  . lisinopril-hydrochlorothiazide (PRINZIDE,ZESTORETIC) 10-12.5 MG tablet Take 1 tablet by mouth daily.  . metFORMIN (GLUCOPHAGE) 500 MG tablet Take two tablets twice daily  . ONETOUCH DELICA LANCETS 40H MISC USE TO TEST TWICE DAILY AS DIRECTED  . ONETOUCH VERIO test strip USE TO CHECK BLOOD GLUCOSE TWICE DAILY AS DIRECTED  . Probiotic Product (PROBIOTIC DAILY PO) Take 1 capsule by mouth daily. Reported on 08/29/2015  . trimethoprim (TRIMPEX) 100 MG tablet Take 1 tablet (100 mg total) by mouth daily.  [name] doxycycline (VIBRAMYCIN) 100 MG capsule Take 1 capsule (100 mg total) by mouth 2 (two) times daily. (Patient not taking: Reported on 08/21/2016)   No facility-administered encounter medications on file as of 08/21/2016.     Activities of Daily Living In your present state of health, do you have any difficulty performing the following activities: 08/21/2016    Hearing? N  Vision? N  Difficulty concentrating or making decisions? N  Walking or climbing stairs? Y  Dressing or bathing? N  Doing errands, shopping? N  Preparing Food and eating ? N  Using the Toilet? N  In the past six months, have you accidently leaked urine? N  Do you have problems with loss of bowel control? N  Managing your Medications? N  Managing your Finances? N  Housekeeping or managing your Housekeeping? N  Some recent data might be hidden    Patient Care Team: Eulas Post, MD as PCP - General (Family Medicine)    Assessment:     Exercise Activities and Dietary recommendations Current Exercise Habits: Structured exercise class, Type of exercise: strength training/weights, Time (Minutes): 30, Frequency (Times/Week): 2, Weekly Exercise (Minutes/Week): 60, Intensity: Mild  Goals    . Exercise 150 minutes per week (moderate activity)          Will start silver sneakers in January       Fall Risk Fall Risk  08/21/2016 10/03/2015 09/20/2014 09/20/2014 04/07/2013  Falls in the past year? No No Yes Yes No  Number falls in past yr: - - 2 or more 2 or more -   Depression Screen PHQ 2/9 Scores 08/21/2016 10/03/2015 09/20/2014 09/20/2014  PHQ - 2 Score 0 0 5 5  PHQ- 9 Score - - 17 8     Cognitive Function Ad8 score is 0         Immunization History  Administered Date(s) Administered  . Influenza Split 06/04/2012  . Influenza, High Dose Seasonal PF 06/20/2015  . Influenza,inj,Quad PF,36+ Mos 05/21/2014  . Pneumococcal Conjugate-13 05/21/2014   Screening Tests Health Maintenance  Topic Date Due  . ZOSTAVAX  11/19/1995  . FOOT EXAM  04/07/2014  . PNA vac Low Risk Adult (2 of 2 - PPSV23) 05/22/2015  . INFLUENZA VACCINE  04/10/2016  . HEMOGLOBIN A1C  10/04/2016  . OPHTHALMOLOGY EXAM  01/18/2017  . TETANUS/TDAP  06/09/2018  . DEXA SCAN  Completed      Plan:   Educated to check with insurance regarding coverage of Shingles vaccination on Part D or Part  B and may have lower  co-pay if provided on the Part D side There is a new shingles vaccine.   Had pneumonia vaccine x 9 to 10 years ago and documented  Had flu vaccine in 06/2016 and documented   Will refer to the Diabetes and Nutrition center for some diabetic counseling   Rec'd copy of 2 AD   During the course of the visit the patient was educated and counseled about the following appropriate screening and preventive services:   Vaccines to include Pneumoccal, Influenza, Hepatitis B, Td, Zostavax, HCV  Electrocardiogram  Cardiovascular Disease  Colorectal cancer screening  Bone density screening  Diabetes screening  Glaucoma screening  Mammography/PAP  Nutrition counseling   Patient Instructions (the written plan) was given to the patient.   ITGPQ,DIYME, RN  08/21/2016   Above notes reviewed.  Agree with assessment and recommendations as per Wynetta Fines, RN  Eulas Post MD [facility] [place]

## 2016-08-22 ENCOUNTER — Other Ambulatory Visit: Payer: Self-pay | Admitting: Emergency Medicine

## 2016-08-22 ENCOUNTER — Other Ambulatory Visit: Payer: Medicare Other | Admitting: *Deleted

## 2016-08-22 DIAGNOSIS — E785 Hyperlipidemia, unspecified: Secondary | ICD-10-CM | POA: Diagnosis not present

## 2016-08-22 DIAGNOSIS — E118 Type 2 diabetes mellitus with unspecified complications: Secondary | ICD-10-CM

## 2016-08-22 DIAGNOSIS — E1310 Other specified diabetes mellitus with ketoacidosis without coma: Secondary | ICD-10-CM

## 2016-08-22 LAB — LIPID PANEL
CHOL/HDL RATIO: 2.5 ratio (ref <5.0)
CHOLESTEROL: 163 mg/dL (ref <200)
HDL: 65 mg/dL (ref >50)
LDL Cholesterol: 78 mg/dL (ref <100)
Triglycerides: 101 mg/dL (ref <150)
VLDL: 20 mg/dL (ref <30)

## 2016-08-22 NOTE — Telephone Encounter (Signed)
Order placed

## 2016-08-22 NOTE — Telephone Encounter (Signed)
Pt has been sch

## 2016-08-24 ENCOUNTER — Ambulatory Visit (INDEPENDENT_AMBULATORY_CARE_PROVIDER_SITE_OTHER): Payer: Medicare Other | Admitting: Internal Medicine

## 2016-08-24 ENCOUNTER — Encounter: Payer: Self-pay | Admitting: Internal Medicine

## 2016-08-24 VITALS — BP 128/64 | HR 86 | Ht 66.5 in | Wt 125.0 lb

## 2016-08-24 DIAGNOSIS — I080 Rheumatic disorders of both mitral and aortic valves: Secondary | ICD-10-CM | POA: Diagnosis not present

## 2016-08-24 DIAGNOSIS — I10 Essential (primary) hypertension: Secondary | ICD-10-CM | POA: Diagnosis not present

## 2016-08-24 MED ORDER — LISINOPRIL-HYDROCHLOROTHIAZIDE 10-12.5 MG PO TABS: 1.0000 | ORAL_TABLET | Freq: Every day | ORAL | 3 refills | Status: DC

## 2016-08-24 MED ORDER — DILTIAZEM HCL ER 240 MG PO CP24: 240.0000 mg | ORAL_CAPSULE | Freq: Every day | ORAL | 3 refills | Status: DC

## 2016-08-24 NOTE — Telephone Encounter (Signed)
Noted  

## 2016-08-24 NOTE — Progress Notes (Signed)
Cardiology Office Note   Date:  08/24/2016   ID:  [name], DOB 1934/06/24, MRN [id number]  PCP:  Eulas Post, MD  Cardiologist:   Dorris Carnes, MD   FU of HTN     History of Present Illness: [name] is a 82 y.o. female with a history of HTN and MVP/MR  I sasw her in [place]  Echo in March 2017 MR was moderate  Pt's breathing is OK  No CP  Rare palpiations    Isolated   Losing wt       Outpatient Medications Prior to Visit  Medication Sig Dispense Refill  . clonazePAM (KLONOPIN) 1 MG tablet Take one po qhs prn insomnia and severe anxiety. May refill on or after Jan 09 2016. 30 tablet 5  . diltiazem (DILACOR XR) 240 MG 24 hr capsule Take 1 capsule (240 mg total) by mouth daily. 90 capsule 3  . fluticasone (FLONASE) 50 MCG/ACT nasal spray SHAKE LIQUID AND USE 2 SPRAYS IN EACH NOSTRIL DAILY AS NEEDED FOR ALLERGIES 16 g 1  . lisinopril-hydrochlorothiazide (PRINZIDE,ZESTORETIC) 10-12.5 MG tablet Take 1 tablet by mouth daily. 90 tablet 3  . metFORMIN (GLUCOPHAGE) 500 MG tablet Take two tablets twice daily 360 tablet 3  . ONETOUCH DELICA LANCETS 19J MISC USE TO TEST TWICE DAILY AS DIRECTED 100 each 0  . ONETOUCH VERIO test strip USE TO CHECK BLOOD GLUCOSE TWICE DAILY AS DIRECTED 100 each 3  . Probiotic Product (PROBIOTIC DAILY PO) Take 1 capsule by mouth daily. Reported on 08/29/2015    . trimethoprim (TRIMPEX) 100 MG tablet Take 1 tablet (100 mg total) by mouth daily. 30 tablet 5  . doxycycline (VIBRAMYCIN) 100 MG capsule Take 1 capsule (100 mg total) by mouth 2 (two) times daily. (Patient not taking: Reported on 08/24/2016) 20 capsule 0   No facility-administered medications prior to visit.      Allergies:   Chlorhexidine gluconate [chlorhexidine]; Codeine; Cortisone; Doxycycline hyclate; Statins; and Sulfonamide derivatives   Past Medical History:  Diagnosis Date  . Allergy   . Anxiety   . Arthritis   . Atrial enlargement, left   . Depression   . Diabetes  mellitus without complication (HCC)    fasting cbg 110s  . GERD (gastroesophageal reflux disease)   . H/O hiatal hernia   . Headache(784.0)   . Heart murmur    06/10/2014 seeing new cardiologist  . Heart murmur   . HOH (hard of hearing)    HOH in left ear; needs to speak to patient in right ear  . Hyperlipidemia   . Hypertension   . Mitral regurgitation   . Mitral valve prolapse   . Palpitations   . PONV (postoperative nausea and vomiting)    Patient stated "I like the patch behind my ear"  . 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 UTI (lower urinary tract infection)     Past Surgical History:  Procedure Laterality Date  . ABDOMINAL HYSTERECTOMY    . Anterior posterior and enterocele repairs  09/21/2004   With uterosacral cardinal colposuspension, partial colpocleisis; Selinda Orion, MD  . BACK SURGERY    . BREAST ENHANCEMENT SURGERY  02/25/2002   Bilateral reduction and excision of accessory breast tissue underneath the left breast; Aretha Parrot., MD  . BREAST SURGERY  11   reduction  . CARDIAC CATHETERIZATION     no PCI  . CATARACT EXTRACTION Bilateral   . COLONOSCOPY    . EYE SURGERY Bilateral  cataract removal  . LUMBAR LAMINECTOMY/ DECOMPRESSION WITH MET-RX N/A 03/16/2014   Procedure: LUMBAR FIVE-SACRAL ONE EXTRAFORAMINAL DISKECTOMY WITH METREX;  Surgeon: Kristeen Miss, MD;  Location: [facility];  Service: Neurosurgery;  Laterality: N/A;  . LUMBAR LAMINECTOMY/DECOMPRESSION MICRODISCECTOMY Left 06/08/2014   Procedure: Left Lumbar Five-Sacral One Microdiskectomy;  Surgeon: Kristeen Miss, MD;  Location: [place] [facility];  Service: Neurosurgery;  Laterality: Left;  Left L5-S1 Microdiskectomy  . PELVIC FLOOR REPAIR    . POSTERIOR LUMBAR FUSION 4 LEVEL N/A 08/08/2015   Procedure: T8-L1 posterior lateral fusion with decompression T12-L1;  Surgeon: Kristeen Miss, MD;  Location: [place] [facility];  Service: Neurosurgery;  Laterality: N/A;  T8-L1 posterior lateral fusion with decompression T12-L1    . ROTATOR CUFF REPAIR Bilateral    11.12  . TONSILLECTOMY AND ADENOIDECTOMY       Social History:  The patient  reports that she has never smoked. She has never used smokeless tobacco. She reports that she drinks alcohol. She reports that she uses drugs, including Marijuana.   Family History:  The patient's family history includes Arthritis in her father and mother; Heart disease in her father; Heart failure in her father; Hyperlipidemia in her father and mother; Hypertension in her mother.    ROS:  Please see the history of present illness. All other systems are reviewed and  Negative to the above problem except as noted.    PHYSICAL EXAM: VS:  BP 128/64   Pulse 86   Ht 5' 6.5" (1.689 m)   Wt 125 lb (56.7 kg)   SpO2 98%   BMI 19.87 kg/m   GEN: Well nourished, well developed, in no acute distress  HEENT: normal  Neck: no JVD, carotid bruits, or masses Cardiac: RRR; Gr I-II/VI sytolic murmur  No rubs, or gallops,no edema  Respiratory:  clear to auscultation bilaterally, normal work of breathing GI: soft, nontender, nondistended, + BS  No hepatomegaly  MS: no deformity Moving all extremities   Skin: warm and dry, no rash Neuro:  Strength and sensation are intact Psych: euthymic mood, full affect   EKG:  EKG is  Not ordered today.   Lipid Panel    Component Value Date/Time   CHOL 163 08/22/2016 0746   TRIG 101 08/22/2016 0746   HDL 65 08/22/2016 0746   CHOLHDL 2.5 08/22/2016 0746   VLDL 20 08/22/2016 0746   LDLCALC 78 08/22/2016 0746   LDLDIRECT 143.6 06/04/2012 0931      Wt Readings from Last 3 Encounters:  08/24/16 125 lb (56.7 kg)  08/21/16 127 lb (57.6 kg)  06/26/16 128 lb 1.6 oz (58.1 kg)      ASSESSMENT AND PLAN:  1  Mitral regurg  F/U echo in the summer    2  HTN  BP is up and down  Highs in 150s    Needs reflills   F/U in June with echo     Current medicines are reviewed at length with the patient today.  The patient does not have concerns  regarding medicines.  Signed, Dorris Carnes, MD  08/24/2016 8:59 AM    [facility] [address], [place], [place]  [address] Phone: [phone]; Fax: [phone]

## 2016-08-24 NOTE — Patient Instructions (Addendum)
Your physician recommends that you continue on your current medications as directed. Please refer to the Current Medication list given to you today. Your physician has requested that you have an echocardiogram.  Echocardiography is a painless test that uses sound waves to create images of your heart. It provides your doctor with information about the size and shape of your heart and how well your heart's chambers and valves are working. This procedure takes approximately one hour. There are no restrictions for this procedure.  Your physician wants you to follow-up in: June 2018 after your echocardiogram.   You will receive a reminder letter in the mail two months in advance. If you don't receive a letter, please call our office to schedule the follow-up appointment.

## 2016-08-30 ENCOUNTER — Other Ambulatory Visit: Payer: Self-pay | Admitting: Family Medicine

## 2016-09-14 ENCOUNTER — Other Ambulatory Visit: Payer: Self-pay | Admitting: Family Medicine

## 2016-10-01 ENCOUNTER — Ambulatory Visit (INDEPENDENT_AMBULATORY_CARE_PROVIDER_SITE_OTHER): Payer: Medicare Other | Admitting: Family Medicine

## 2016-10-01 VITALS — BP 100/60 | HR 107 | Ht 66.5 in | Wt 126.0 lb

## 2016-10-01 DIAGNOSIS — J329 Chronic sinusitis, unspecified: Secondary | ICD-10-CM

## 2016-10-01 DIAGNOSIS — R8290 Unspecified abnormal findings in urine: Secondary | ICD-10-CM | POA: Diagnosis not present

## 2016-10-01 DIAGNOSIS — F329 Major depressive disorder, single episode, unspecified: Secondary | ICD-10-CM

## 2016-10-01 DIAGNOSIS — E119 Type 2 diabetes mellitus without complications: Secondary | ICD-10-CM | POA: Diagnosis not present

## 2016-10-01 DIAGNOSIS — F32A Depression, unspecified: Secondary | ICD-10-CM

## 2016-10-01 LAB — POCT GLYCOSYLATED HEMOGLOBIN (HGB A1C): HEMOGLOBIN A1C: 6.2

## 2016-10-01 MED ORDER — CITALOPRAM HYDROBROMIDE 20 MG PO TABS: 20.0000 mg | ORAL_TABLET | Freq: Every day | ORAL | 3 refills | Status: DC

## 2016-10-01 MED ORDER — CEFUROXIME AXETIL 250 MG PO TABS: 250.0000 mg | ORAL_TABLET | Freq: Two times a day (BID) | ORAL | 0 refills | Status: DC

## 2016-10-01 NOTE — Progress Notes (Signed)
Subjective:     Patient ID: [name], female   DOB: 1934-08-28, 83 y.o.   MRN: [id number]  HPI Patient here today for multiple issues:  She's noticed some increased "odor" to her urine past 2 weeks but no burning with urination. She's had history of frequent UTIs in past is currently taking trimethoprim 100 mg daily for prevention. This has seemed to make an improvement.  Past history of depression. Possible seasonal affect disorder. She tends to flare the wintertime. She has done extremely well with citalopram 20 mg daily in the past. She took herself off this months ago. She's had some recent sad mood. Low motivation. No suicidal medication. She is requesting refills of citalopram to get back on  She relates approximately 2 month history almost daily bilateral maxillary facial pain and headache. Minimal nasal discharge. No fever. No chills. Increased malaise. She noted upper teeth pain on the left and went to dentist and was told she had no evidence for dental problem.  Type 2 diabetes. Has been controlled without medication other than metformin thus far. No polyuria or polydipsia  Past Medical History:  Diagnosis Date  . Allergy   . Anxiety   . Arthritis   . Atrial enlargement, left   . Depression   . Diabetes mellitus without complication (HCC)    fasting cbg 110s  . GERD (gastroesophageal reflux disease)   . H/O hiatal hernia   . Headache(784.0)   . Heart murmur    06/10/2014 seeing new cardiologist  . Heart murmur   . HOH (hard of hearing)    HOH in left ear; needs to speak to patient in right ear  . Hyperlipidemia   . Hypertension   . Mitral regurgitation   . Mitral valve prolapse   . Palpitations   . PONV (postoperative nausea and vomiting)    Patient stated "I like the patch behind my ear"  . 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 UTI (lower urinary tract infection)    Past Surgical History:  Procedure Laterality Date  . ABDOMINAL HYSTERECTOMY    . Anterior posterior and  enterocele repairs  09/21/2004   With uterosacral cardinal colposuspension, partial colpocleisis; Selinda Orion, MD  . BACK SURGERY    . BREAST ENHANCEMENT SURGERY  02/25/2002   Bilateral reduction and excision of accessory breast tissue underneath the left breast; Aretha Parrot., MD  . BREAST SURGERY  11   reduction  . CARDIAC CATHETERIZATION     no PCI  . CATARACT EXTRACTION Bilateral   . COLONOSCOPY    . EYE SURGERY Bilateral    cataract removal  . LUMBAR LAMINECTOMY/ DECOMPRESSION WITH MET-RX N/A 03/16/2014   Procedure: LUMBAR FIVE-SACRAL ONE EXTRAFORAMINAL DISKECTOMY WITH METREX;  Surgeon: Kristeen Miss, MD;  Location: [facility];  Service: Neurosurgery;  Laterality: N/A;  . LUMBAR LAMINECTOMY/DECOMPRESSION MICRODISCECTOMY Left 06/08/2014   Procedure: Left Lumbar Five-Sacral One Microdiskectomy;  Surgeon: Kristeen Miss, MD;  Location: [facility];  Service: Neurosurgery;  Laterality: Left;  Left L5-S1 Microdiskectomy  . PELVIC FLOOR REPAIR    . POSTERIOR LUMBAR FUSION 4 LEVEL N/A 08/08/2015   Procedure: T8-L1 posterior lateral fusion with decompression T12-L1;  Surgeon: Kristeen Miss, MD;  Location: [facility];  Service: Neurosurgery;  Laterality: N/A;  T8-L1 posterior lateral fusion with decompression T12-L1  . ROTATOR CUFF REPAIR Bilateral    11.12  . TONSILLECTOMY AND ADENOIDECTOMY      reports that she has never smoked. She has never used  smokeless tobacco. She reports that she drinks alcohol. She reports that she uses drugs, including Marijuana. family history includes Arthritis in her father and mother; Heart disease in her father; Heart failure in her father; Hyperlipidemia in her father and mother; Hypertension in her mother. Allergies  Allergen Reactions  . Chlorhexidine Gluconate [Chlorhexidine] Itching  . Codeine     Facial swelling  . Cortisone     Insomnia, heart palpitations (po only)  . Doxycycline Hyclate Nausea Only    veryt nauseated; declines to take  again   . Statins Other (See Comments)    Myalgias   . Sulfonamide Derivatives     As a child     Review of Systems  Constitutional: Positive for fatigue. Negative for chills and fever.  HENT: Positive for congestion, sinus pain and sinus pressure.   Eyes: Negative for visual disturbance.  Respiratory: Negative for cough, chest tightness, shortness of breath and wheezing.   Cardiovascular: Negative for chest pain, palpitations and leg swelling.  Endocrine: Negative for polydipsia and polyuria.  Genitourinary: Positive for dysuria.  Neurological: Positive for headaches. Negative for dizziness, seizures, syncope, weakness and light-headedness.       Objective:   Physical Exam  Constitutional: She appears well-developed and well-nourished.  HENT:  Minimal cerumen both canals. Nares are clear but does have some minimal crusted yellow mucus bilaterally  Neck: Neck supple.  Cardiovascular: Normal rate and regular rhythm.   Pulmonary/Chest: Effort normal and breath sounds normal. No respiratory distress. She has no wheezes. She has no rales.  Musculoskeletal: She exhibits no edema.  Lymphadenopathy:    She has no cervical adenopathy.  Psychiatric: She has a normal mood and affect. Her behavior is normal.       Assessment:     #1 probable subacute versus chronic bilateral maxillary sinusitis  #2 history of recurrent depression. Question seasonal affective disorder  #3 dysuria. Malodorus urine.  Patient unable to give urine specimen  #4 type 2 diabetes which has been well controlled with metformin    Plan:     -Recheck hemoglobin A1c (6.2%) -Refilled citalopram 20 mg today in office and follow-up in a couple weeks -Start Ceftin 250 mg twice daily for 10 days.  Consider better anaerobic coverage if not improving in 2 weeks. She thinks she's had abdominal cramping with Augmentin in the past. -Patient was unable to provide urine specimen.  Eulas Post MD [place]  [facility]

## 2016-10-01 NOTE — Patient Instructions (Signed)

## 2016-10-01 NOTE — Progress Notes (Signed)
Pre visit review using our clinic review tool, if applicable. No additional management support is needed unless otherwise documented below in the visit note. 

## 2016-10-05 ENCOUNTER — Ambulatory Visit: Payer: Medicare Other | Admitting: Dietician

## 2016-10-05 ENCOUNTER — Ambulatory Visit (INDEPENDENT_AMBULATORY_CARE_PROVIDER_SITE_OTHER): Payer: Medicare Other | Admitting: Family Medicine

## 2016-10-05 VITALS — BP 120/80 | HR 95 | Temp 97.8°F | Ht 66.5 in | Wt 124.6 lb

## 2016-10-05 DIAGNOSIS — H6121 Impacted cerumen, right ear: Secondary | ICD-10-CM

## 2016-10-05 NOTE — Progress Notes (Signed)
Pre visit review using our clinic review tool, if applicable. No additional management support is needed unless otherwise documented below in the visit note. 

## 2016-10-05 NOTE — Progress Notes (Signed)
Subjective:     Patient ID: [name], female   DOB: August 17, 1934, 83 y.o.   MRN: [id number]  HPI Patient seen with right ear fullness. She was seen recently with sinusitis and those symptoms are much improved with Ceftin. She has some chronic hearing loss in the left ear that has had some wax in the right ear. She tried some over-the-counter drops yesterday but thinks that made things worse. She had decreased hearing in the right ear. No drainage. No ear pain.  Past Medical History:  Diagnosis Date  . Allergy   . Anxiety   . Arthritis   . Atrial enlargement, left   . Depression   . Diabetes mellitus without complication (HCC)    fasting cbg 110s  . GERD (gastroesophageal reflux disease)   . H/O hiatal hernia   . Headache(784.0)   . Heart murmur    06/10/2014 seeing new cardiologist  . Heart murmur   . HOH (hard of hearing)    HOH in left ear; needs to speak to patient in right ear  . Hyperlipidemia   . Hypertension   . Mitral regurgitation   . Mitral valve prolapse   . Palpitations   . PONV (postoperative nausea and vomiting)    Patient stated "I like the patch behind my ear"  . 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 UTI (lower urinary tract infection)    Past Surgical History:  Procedure Laterality Date  . ABDOMINAL HYSTERECTOMY    . Anterior posterior and enterocele repairs  09/21/2004   With uterosacral cardinal colposuspension, partial colpocleisis; Selinda Orion, MD  . BACK SURGERY    . BREAST ENHANCEMENT SURGERY  02/25/2002   Bilateral reduction and excision of accessory breast tissue underneath the left breast; Aretha Parrot., MD  . BREAST SURGERY  11   reduction  . CARDIAC CATHETERIZATION     no PCI  . CATARACT EXTRACTION Bilateral   . COLONOSCOPY    . EYE SURGERY Bilateral    cataract removal  . LUMBAR LAMINECTOMY/ DECOMPRESSION WITH MET-RX N/A 03/16/2014   Procedure: LUMBAR FIVE-SACRAL ONE EXTRAFORAMINAL DISKECTOMY WITH METREX;  Surgeon: Kristeen Miss, MD;  Location:  [place] [facility];  Service: Neurosurgery;  Laterality: N/A;  . LUMBAR LAMINECTOMY/DECOMPRESSION MICRODISCECTOMY Left 06/08/2014   Procedure: Left Lumbar Five-Sacral One Microdiskectomy;  Surgeon: Kristeen Miss, MD;  Location: [place] [facility];  Service: Neurosurgery;  Laterality: Left;  Left L5-S1 Microdiskectomy  . PELVIC FLOOR REPAIR    . POSTERIOR LUMBAR FUSION 4 LEVEL N/A 08/08/2015   Procedure: T8-L1 posterior lateral fusion with decompression T12-L1;  Surgeon: Kristeen Miss, MD;  Location: [place] [facility];  Service: Neurosurgery;  Laterality: N/A;  T8-L1 posterior lateral fusion with decompression T12-L1  . ROTATOR CUFF REPAIR Bilateral    11.12  . TONSILLECTOMY AND ADENOIDECTOMY      reports that she has never smoked. She has never used smokeless tobacco. She reports that she drinks alcohol. She reports that she uses drugs, including Marijuana. family history includes Arthritis in her father and mother; Heart disease in her father; Heart failure in her father; Hyperlipidemia in her father and mother; Hypertension in her mother. Allergies  Allergen Reactions  . Chlorhexidine Gluconate [Chlorhexidine] Itching  . Codeine     Facial swelling  . Cortisone     Insomnia, heart palpitations (po only)  . Doxycycline Hyclate Nausea Only    veryt nauseated; declines to take again   . Statins Other (See Comments)    Myalgias   .  Sulfonamide Derivatives     As a child     Review of Systems  Constitutional: Negative for chills and fever.  HENT: Negative for ear discharge and ear pain.   Respiratory: Negative for cough.        Objective:   Physical Exam  Constitutional: She appears well-developed and well-nourished.  HENT:  Cerumen impaction right ear canal. Minimal cerumen left canal  Cardiovascular: Normal rate.   Pulmonary/Chest: Effort normal and breath sounds normal. No respiratory distress. She has no wheezes. She has no rales.       Assessment:     Cerumen impaction right canal.   Able to remove entirely with curette.    Plan:     -Using curette, we were able to remove cerumen completely from the right and left ears without difficulty. Patient tolerated well. She was able to hear better afterwards out of the right ear.  Eulas Post MD [facility] at [facility]

## 2016-10-10 ENCOUNTER — Other Ambulatory Visit: Payer: Medicare Other

## 2016-10-28 ENCOUNTER — Other Ambulatory Visit: Payer: Self-pay | Admitting: Family Medicine

## 2016-10-29 DIAGNOSIS — K5904 Chronic idiopathic constipation: Secondary | ICD-10-CM | POA: Diagnosis not present

## 2016-10-29 DIAGNOSIS — K219 Gastro-esophageal reflux disease without esophagitis: Secondary | ICD-10-CM | POA: Diagnosis not present

## 2016-10-29 DIAGNOSIS — Z86010 Personal history of colonic polyps: Secondary | ICD-10-CM | POA: Diagnosis not present

## 2016-11-11 ENCOUNTER — Other Ambulatory Visit: Payer: Self-pay | Admitting: Family Medicine

## 2016-11-14 DIAGNOSIS — M5481 Occipital neuralgia: Secondary | ICD-10-CM | POA: Diagnosis not present

## 2016-12-03 DIAGNOSIS — M5481 Occipital neuralgia: Secondary | ICD-10-CM | POA: Diagnosis not present

## 2016-12-05 ENCOUNTER — Telehealth: Payer: Self-pay | Admitting: *Deleted

## 2016-12-05 MED ORDER — CLONAZEPAM 1 MG PO TABS: ORAL_TABLET | ORAL | 0 refills | Status: DC

## 2016-12-05 NOTE — Telephone Encounter (Signed)
Rx faxed.  Patient will call back to schedule an appointment

## 2016-12-05 NOTE — Telephone Encounter (Signed)
Patient requesting a refill of Clonazepam.  Last refill 06/26/16 last office visit 10/05/16 okay to fill?

## 2016-12-05 NOTE — Telephone Encounter (Signed)
Refill once and set up follow up.

## 2016-12-10 DIAGNOSIS — K5904 Chronic idiopathic constipation: Secondary | ICD-10-CM | POA: Diagnosis not present

## 2016-12-17 DIAGNOSIS — K5730 Diverticulosis of large intestine without perforation or abscess without bleeding: Secondary | ICD-10-CM | POA: Diagnosis not present

## 2016-12-17 DIAGNOSIS — Z1211 Encounter for screening for malignant neoplasm of colon: Secondary | ICD-10-CM | POA: Diagnosis not present

## 2016-12-17 DIAGNOSIS — Z86010 Personal history of colonic polyps: Secondary | ICD-10-CM | POA: Diagnosis not present

## 2016-12-17 DIAGNOSIS — D122 Benign neoplasm of ascending colon: Secondary | ICD-10-CM | POA: Diagnosis not present

## 2016-12-17 DIAGNOSIS — K635 Polyp of colon: Secondary | ICD-10-CM | POA: Diagnosis not present

## 2016-12-18 DIAGNOSIS — D122 Benign neoplasm of ascending colon: Secondary | ICD-10-CM | POA: Diagnosis not present

## 2016-12-27 ENCOUNTER — Telehealth: Payer: Self-pay | Admitting: Family Medicine

## 2016-12-27 MED ORDER — ZOSTER VAC RECOMB ADJUVANTED 50 MCG/0.5ML IM SUSR: 0.5000 mL | Freq: Once | INTRAMUSCULAR | 1 refills | Status: AC

## 2016-12-27 NOTE — Telephone Encounter (Signed)
I called the pt and informed her Dr Elease Hashimoto is not in the office and the Rx for Shingrix could be sent electronically to the pharmacy of her choice.  Patient is aware the Rx was sent to [facility].

## 2016-12-27 NOTE — Telephone Encounter (Signed)
Pt would like to have a printed Rx for Shinglrix so that she can take it to the pharmacy.  Pt state that her husband has the shingles and would like to get this Rx today.  Pt state that she had checked with her insurance and they said the cost will be $45.00.

## 2017-01-24 DIAGNOSIS — H524 Presbyopia: Secondary | ICD-10-CM | POA: Diagnosis not present

## 2017-01-24 DIAGNOSIS — H5211 Myopia, right eye: Secondary | ICD-10-CM | POA: Diagnosis not present

## 2017-01-24 DIAGNOSIS — H353131 Nonexudative age-related macular degeneration, bilateral, early dry stage: Secondary | ICD-10-CM | POA: Diagnosis not present

## 2017-01-24 DIAGNOSIS — E119 Type 2 diabetes mellitus without complications: Secondary | ICD-10-CM | POA: Diagnosis not present

## 2017-01-24 DIAGNOSIS — Z961 Presence of intraocular lens: Secondary | ICD-10-CM | POA: Diagnosis not present

## 2017-02-06 ENCOUNTER — Ambulatory Visit (INDEPENDENT_AMBULATORY_CARE_PROVIDER_SITE_OTHER): Payer: Medicare Other | Admitting: Family Medicine

## 2017-02-06 VITALS — BP 122/64 | HR 81 | Temp 98.5°F | Ht 66.5 in | Wt 133.7 lb

## 2017-02-06 DIAGNOSIS — H3530 Unspecified macular degeneration: Secondary | ICD-10-CM | POA: Diagnosis not present

## 2017-02-06 DIAGNOSIS — N952 Postmenopausal atrophic vaginitis: Secondary | ICD-10-CM

## 2017-02-06 DIAGNOSIS — F339 Major depressive disorder, recurrent, unspecified: Secondary | ICD-10-CM | POA: Diagnosis not present

## 2017-02-06 DIAGNOSIS — E119 Type 2 diabetes mellitus without complications: Secondary | ICD-10-CM

## 2017-02-06 DIAGNOSIS — I10 Essential (primary) hypertension: Secondary | ICD-10-CM | POA: Diagnosis not present

## 2017-02-06 LAB — BASIC METABOLIC PANEL
BUN: 14 mg/dL (ref 6–23)
CHLORIDE: 101 meq/L (ref 96–112)
CO2: 27 mEq/L (ref 19–32)
Calcium: 9.4 mg/dL (ref 8.4–10.5)
Creatinine, Ser: 0.77 mg/dL (ref 0.40–1.20)
GFR: 76.43 mL/min (ref >60.00)
Glucose, Bld: 168 mg/dL — ABNORMAL HIGH (ref 70–99)
POTASSIUM: 4.1 meq/L (ref 3.5–5.1)
SODIUM: 135 meq/L (ref 135–145)

## 2017-02-06 LAB — HEMOGLOBIN A1C: HEMOGLOBIN A1C: 6.8 % — AB (ref 4.6–6.5)

## 2017-02-06 MED ORDER — CLONAZEPAM 1 MG PO TABS: ORAL_TABLET | ORAL | 3 refills | Status: DC

## 2017-02-06 MED ORDER — METFORMIN HCL 500 MG PO TABS: 1000.0000 mg | ORAL_TABLET | Freq: Two times a day (BID) | ORAL | 3 refills | Status: DC

## 2017-02-06 MED ORDER — CITALOPRAM HYDROBROMIDE 20 MG PO TABS: 20.0000 mg | ORAL_TABLET | Freq: Every day | ORAL | 3 refills | Status: DC

## 2017-02-06 MED ORDER — ESTROGENS, CONJUGATED 0.625 MG/GM VA CREA: 1.0000 | TOPICAL_CREAM | Freq: Every day | VAGINAL | 12 refills | Status: DC

## 2017-02-06 NOTE — Progress Notes (Signed)
Subjective:     Patient ID: [name], female   DOB: [birth year], 83 y.o.   MRN: [id number]  HPI Patient seen for multiple issues today. She has chronic prompt including hypertension, mitral regurgitation, GERD, type 2 diabetes, recently diagnosed macular degeneration, essential tremor, kyphoscoliosis, atrophic vaginitis, chronic insomnia, and recurrent depression.  Medications reviewed. Her blood pressures been stable. Blood sugars been running slightly high and she increased metformin to 2 tablets twice daily and recent fasting blood sugars consistently less than 130. Last A1c 6.2%.  She has history of atrophic vaginitis has had some recent problems with vaginal drying and itching. She has used topical estrogen the past and requesting refill. No contraindications.  History of recurrent depression stable on Celexa. Requesting refills.  Past Medical History:  Diagnosis Date  . Allergy   . Anxiety   . Arthritis   . Atrial enlargement, left   . Depression   . Diabetes mellitus without complication (HCC)    fasting cbg 110s  . GERD (gastroesophageal reflux disease)   . H/O hiatal hernia   . Headache(784.0)   . Heart murmur    06/10/2014 seeing new cardiologist  . Heart murmur   . HOH (hard of hearing)    HOH in left ear; needs to speak to patient in right ear  . Hyperlipidemia   . Hypertension   . Mitral regurgitation   . Mitral valve prolapse   . Palpitations   . PONV (postoperative nausea and vomiting)    Patient stated "I like the patch behind my ear"  . 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 UTI (lower urinary tract infection)    Past Surgical History:  Procedure Laterality Date  . ABDOMINAL HYSTERECTOMY    . Anterior posterior and enterocele repairs  09/21/2004   With uterosacral cardinal colposuspension, partial colpocleisis; Selinda Orion, MD  . BACK SURGERY    . BREAST ENHANCEMENT SURGERY  02/25/2002   Bilateral reduction and excision of accessory breast tissue underneath the  left breast; Aretha Parrot., MD  . BREAST SURGERY  11   reduction  . CARDIAC CATHETERIZATION     no PCI  . CATARACT EXTRACTION Bilateral   . COLONOSCOPY    . EYE SURGERY Bilateral    cataract removal  . LUMBAR LAMINECTOMY/ DECOMPRESSION WITH MET-RX N/A 03/16/2014   Procedure: LUMBAR FIVE-SACRAL ONE EXTRAFORAMINAL DISKECTOMY WITH METREX;  Surgeon: Kristeen Miss, MD;  Location: [place] [facility];  Service: Neurosurgery;  Laterality: N/A;  . LUMBAR LAMINECTOMY/DECOMPRESSION MICRODISCECTOMY Left 06/08/2014   Procedure: Left Lumbar Five-Sacral One Microdiskectomy;  Surgeon: Kristeen Miss, MD;  Location: [place] [facility];  Service: Neurosurgery;  Laterality: Left;  Left L5-S1 Microdiskectomy  . PELVIC FLOOR REPAIR    . POSTERIOR LUMBAR FUSION 4 LEVEL N/A 08/08/2015   Procedure: T8-L1 posterior lateral fusion with decompression T12-L1;  Surgeon: Kristeen Miss, MD;  Location: [place] [facility];  Service: Neurosurgery;  Laterality: N/A;  T8-L1 posterior lateral fusion with decompression T12-L1  . ROTATOR CUFF REPAIR Bilateral    11.12  . TONSILLECTOMY AND ADENOIDECTOMY      reports that she has never smoked. She has never used smokeless tobacco. She reports that she drinks alcohol. She reports that she uses drugs, including Marijuana. family history includes Arthritis in her father and mother; Heart disease in her father; Heart failure in her father; Hyperlipidemia in her father and mother; Hypertension in her mother. Allergies  Allergen Reactions  . Chlorhexidine Gluconate [Chlorhexidine] Itching  . Codeine  Facial swelling  . Cortisone     Insomnia, heart palpitations (po only)  . Doxycycline Hyclate Nausea Only    veryt nauseated; declines to take again   . Statins Other (See Comments)    Myalgias   . Sulfonamide Derivatives     As a child     Review of Systems  Constitutional: Positive for fatigue.  Eyes: Negative for visual disturbance.  Respiratory: Negative for cough, chest tightness,  shortness of breath and wheezing.   Cardiovascular: Negative for chest pain, palpitations and leg swelling.  Endocrine: Negative for polydipsia and polyuria.  Genitourinary: Negative for vaginal pain.  Neurological: Negative for dizziness, seizures, syncope, weakness, light-headedness and headaches.       Objective:   Physical Exam  Constitutional: She appears well-developed and well-nourished.  Eyes: Pupils are equal, round, and reactive to light.  Neck: Neck supple. No JVD present. No thyromegaly present.  Cardiovascular: Normal rate and regular rhythm.  Exam reveals no gallop.   Pulmonary/Chest: Effort normal and breath sounds normal. No respiratory distress. She has no wheezes. She has no rales.  Musculoskeletal: She exhibits no edema.  Neurological: She is alert.       Assessment:     #1 hypertension stable and at goal  #2 type 2 diabetes with history of good control  #3 history of atrophic vaginitis  #4 history of recurrent depression stable on Celexa    Plan:     -Recheck labs with basic metabolic panel and hemoglobin A1c -Refill Celexa for one year -Increased metformin to 500 milligrams to 2 twice daily with refills given -Premarin vaginal cream 5.400 mg one applicator daily for 1 week then try reducing to once twice weekly -Refill Klonopin though we've recommended she try reducing this to one half tablet at night and then hopefully tapering off completely.  Discussed potential adverse affects of chronic benzodiazepine use.  Eulas Post MD [facility] at [facility]

## 2017-02-12 ENCOUNTER — Ambulatory Visit (HOSPITAL_COMMUNITY): Payer: Medicare Other | Attending: Cardiovascular Disease

## 2017-02-12 ENCOUNTER — Other Ambulatory Visit: Payer: Self-pay

## 2017-02-12 ENCOUNTER — Ambulatory Visit (INDEPENDENT_AMBULATORY_CARE_PROVIDER_SITE_OTHER): Payer: Medicare Other | Admitting: Family Medicine

## 2017-02-12 ENCOUNTER — Encounter: Payer: Self-pay | Admitting: Family Medicine

## 2017-02-12 VITALS — BP 136/60 | HR 97 | Temp 97.6°F | Ht 66.5 in | Wt 132.3 lb

## 2017-02-12 DIAGNOSIS — E119 Type 2 diabetes mellitus without complications: Secondary | ICD-10-CM | POA: Insufficient documentation

## 2017-02-12 DIAGNOSIS — R300 Dysuria: Secondary | ICD-10-CM

## 2017-02-12 DIAGNOSIS — I059 Rheumatic mitral valve disease, unspecified: Secondary | ICD-10-CM | POA: Diagnosis present

## 2017-02-12 DIAGNOSIS — I119 Hypertensive heart disease without heart failure: Secondary | ICD-10-CM | POA: Insufficient documentation

## 2017-02-12 DIAGNOSIS — I080 Rheumatic disorders of both mitral and aortic valves: Secondary | ICD-10-CM | POA: Diagnosis not present

## 2017-02-12 LAB — POC URINALSYSI DIPSTICK (AUTOMATED)
BILIRUBIN UA: NEGATIVE
Glucose, UA: NEGATIVE
KETONES UA: NEGATIVE
LEUKOCYTES UA: NEGATIVE
Nitrite, UA: POSITIVE
PH UA: 6 (ref 5.0–8.0)
PROTEIN UA: NEGATIVE
RBC UA: NEGATIVE
SPEC GRAV UA: 1.01 (ref 1.010–1.025)
Urobilinogen, UA: 0.2 E.U./dL

## 2017-02-12 MED ORDER — CIPROFLOXACIN HCL 250 MG PO TABS: 250.0000 mg | ORAL_TABLET | Freq: Two times a day (BID) | ORAL | 0 refills | Status: DC

## 2017-02-12 NOTE — Patient Instructions (Signed)
I sent the cipro to your pharmacy.  We have ordered labs or studies at this visit. It can take up to 1-2 weeks for results and processing. IF results require follow up or explanation, we will call you with instructions. Clinically stable results will be released to your [facility]. If you have not heard from [place] or cannot find your results in [facility] in 2 weeks please contact our office at [phone].  If you are not yet signed up for [facility], please consider signing up.  I hope you are feeling better soon! Seek care immediately if worsening, new concerns or you are not improving with treatment.

## 2017-02-12 NOTE — Progress Notes (Signed)
HPI:  Acute visit for "UTI": -reports long hx recurrent UTIs -reports was doing well for a while as had finally started trimpex, then she stopped the trimpex recently -for the last 4 days she has had worsening urgency, frequency, burning -no fevers, flank pain, frank blood, vomiting, diarrhea or malaise -she restarted the trimpex 2 days ago and the vaginal estrogen cream her PCP had given her -she is adamant about starting cipro today - reports this is what she needs for these symptoms regardless of test results, understands risks  ROS: See pertinent positives and negatives per HPI.  Past Medical History:  Diagnosis Date  . Allergy   . Anxiety   . Arthritis   . Atrial enlargement, left   . Depression   . Diabetes mellitus without complication (HCC)    fasting cbg 110s  . GERD (gastroesophageal reflux disease)   . H/O hiatal hernia   . Headache(784.0)   . Heart murmur    06/10/2014 seeing new cardiologist  . Heart murmur   . HOH (hard of hearing)    HOH in left ear; needs to speak to patient in right ear  . Hyperlipidemia   . Hypertension   . Mitral regurgitation   . Mitral valve prolapse   . Palpitations   . PONV (postoperative nausea and vomiting)    Patient stated "I like the patch behind my ear"  . 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 UTI (lower urinary tract infection)     Past Surgical History:  Procedure Laterality Date  . ABDOMINAL HYSTERECTOMY    . Anterior posterior and enterocele repairs  09/21/2004   With uterosacral cardinal colposuspension, partial colpocleisis; Selinda Orion, MD  . BACK SURGERY    . BREAST ENHANCEMENT SURGERY  02/25/2002   Bilateral reduction and excision of accessory breast tissue underneath the left breast; Aretha Parrot., MD  . BREAST SURGERY  13   reduction  . CARDIAC CATHETERIZATION     no PCI  . CATARACT EXTRACTION Bilateral   . COLONOSCOPY    . EYE SURGERY Bilateral    cataract removal  . LUMBAR LAMINECTOMY/ DECOMPRESSION WITH  MET-RX N/A 03/16/2014   Procedure: LUMBAR FIVE-SACRAL ONE EXTRAFORAMINAL DISKECTOMY WITH METREX;  Surgeon: Kristeen Miss, MD;  Location: [place] [facility];  Service: Neurosurgery;  Laterality: N/A;  . LUMBAR LAMINECTOMY/DECOMPRESSION MICRODISCECTOMY Left 06/08/2014   Procedure: Left Lumbar Five-Sacral One Microdiskectomy;  Surgeon: Kristeen Miss, MD;  Location: [place] [facility];  Service: Neurosurgery;  Laterality: Left;  Left L5-S1 Microdiskectomy  . PELVIC FLOOR REPAIR    . POSTERIOR LUMBAR FUSION 4 LEVEL N/A 08/08/2015   Procedure: T8-L1 posterior lateral fusion with decompression T12-L1;  Surgeon: Kristeen Miss, MD;  Location: [place] [facility];  Service: Neurosurgery;  Laterality: N/A;  T8-L1 posterior lateral fusion with decompression T12-L1  . ROTATOR CUFF REPAIR Bilateral    11.12  . TONSILLECTOMY AND ADENOIDECTOMY      Family History  Problem Relation Age of Onset  . Heart failure Father   . Arthritis Father   . Hyperlipidemia Father   . Heart disease Father   . Arthritis Mother   . Hyperlipidemia Mother   . Hypertension Mother     Social History   Social History  . Marital status: Married    Spouse name: N/A  . Number of children: N/A  . Years of education: N/A   Occupational History  . Retired Retired   Social History Main Topics  . Smoking status: Never Smoker  .  Smokeless tobacco: Never Used  . Alcohol use Yes     Comment: newly discovered diabetes, she has stopped her alcohol intake; rare alcohol intake  . Drug use: Yes    Types: Marijuana  . Sexual activity: Not Asked   Other Topics Concern  . None   Social History Narrative   Married   Regular exercise     Current Outpatient Prescriptions:  .  citalopram (CELEXA) 20 MG tablet, Take 1 tablet (20 mg total) by mouth daily., Disp: 90 tablet, Rfl: 3 .  clonazePAM (KLONOPIN) 1 MG tablet, Take one po qhs prn insomnia and severe anxiety. May refill on or after Jan 09 2016., Disp: 30 tablet, Rfl: 3 .  conjugated estrogens  (PREMARIN) vaginal cream, Place 1 Applicatorful vaginally daily., Disp: 42.5 g, Rfl: 12 .  diltiazem (DILACOR XR) 240 MG 24 hr capsule, Take 1 capsule (240 mg total) by mouth daily., Disp: 90 capsule, Rfl: 3 .  fluticasone (FLONASE) 50 MCG/ACT nasal spray, SHAKE LIQUID AND USE 2 SPRAYS IN EACH NOSTRIL DAILY AS NEEDED FOR ALLERGIES, Disp: 16 g, Rfl: 2 .  lisinopril-hydrochlorothiazide (PRINZIDE,ZESTORETIC) 10-12.5 MG tablet, Take 1 tablet by mouth daily., Disp: 90 tablet, Rfl: 3 .  metFORMIN (GLUCOPHAGE) 500 MG tablet, Take 2 tablets (1,000 mg total) by mouth 2 (two) times daily with a meal., Disp: 360 tablet, Rfl: 3 .  ONETOUCH DELICA LANCETS 82P MISC, USE TO TEST TWICE DAILY AS DIRECTED, Disp: 100 each, Rfl: 0 .  ONETOUCH VERIO test strip, USE TO CHECK BLOOD GLUCOSE TWICE DAILY AS DIRECTED, Disp: 100 each, Rfl: 3 .  Probiotic Product (PROBIOTIC DAILY PO), Take 1 capsule by mouth daily. Reported on 08/29/2015, Disp: , Rfl:  .  trimethoprim (TRIMPEX) 100 MG tablet, TAKE 1 TABLET(100 MG) BY MOUTH DAILY, Disp: 90 tablet, Rfl: 1 .  ciprofloxacin (CIPRO) 250 MG tablet, Take 1 tablet (250 mg total) by mouth 2 (two) times daily., Disp: 6 tablet, Rfl: 0  EXAM:  Vitals:   02/12/17 1448  BP: 136/60  Pulse: 97  Temp: 97.6 F (36.4 C)    Body mass index is 21.03 kg/m.  GENERAL: vitals reviewed and listed above, alert, oriented, appears well hydrated and in no acute distress  HEENT: atraumatic, conjunttiva clear, no obvious abnormalities on inspection of external nose and ears  NECK: no obvious masses on inspection  LUNGS: clear to auscultation bilaterally, no wheezes, rales or rhonchi, good air movement  CV: HRRR, no peripheral edema  ABD: soft, NTTP, no CVA TTP  MS: moves all extremities without noticeable abnormality  PSYCH: pleasant and cooperative, no obvious depression or anxiety  ASSESSMENT AND PLAN:  Discussed the following assessment and plan:  Dysuria - Plan: POCT Urinalysis  Dipstick (Automated)  -udip with N+, culture pending -discussed potential etiologies, workup and treatment options -she is adamant about starting cipro for her hx and symptoms, understands risks -advised following PCP advise for prevention -Patient advised to return or notify a doctor immediately if symptoms worsen or persist or new concerns arise.  Patient Instructions  I sent the cipro to your pharmacy.  We have ordered labs or studies at this visit. It can take up to 1-2 weeks for results and processing. IF results require follow up or explanation, we will call you with instructions. Clinically stable results will be released to your [facility]. If you have not heard from [place] or cannot find your results in [facility] in 2 weeks please contact our office at [phone].  If you are  not yet signed up for [facility], please consider signing up.  I hope you are feeling better soon! Seek care immediately if worsening, new concerns or you are not improving with treatment.          Colin Benton R., DO

## 2017-02-12 NOTE — Addendum Note (Signed)
Addended by: Agnes Lawrence on: 02/12/2017 03:25 PM   Modules accepted: Orders

## 2017-02-13 LAB — URINE CULTURE: Organism ID, Bacteria: NO GROWTH

## 2017-02-14 NOTE — Progress Notes (Signed)
Cardiology Office Note   Date:  02/15/2017   ID:  JAMES LAFALCE, DOB [birth year], MRN [id number]  PCP:  Eulas Post, MD  Cardiologist:   Dorris Carnes, MD   F/U of HTN and MV dz      History of Present Illness: [name] is a 83 y.o. female with a history of HTN and MV dz (MVP/MR)  Echo in March 2017 Moderate MR I saw her in December 2017  Since seen some L ankle swelling  Breathing may be a little short when lays down at night  NOt when doing things Started using treadmill  Walks about 20 min  SLowly    No CP NO PND Occasional palpitations  Last a couple seconds    She says she burps and spells go away       Current Meds  Medication Sig  . citalopram (CELEXA) 20 MG tablet Take 1 tablet (20 mg total) by mouth daily.  . clonazePAM (KLONOPIN) 1 MG tablet Take one po qhs prn insomnia and severe anxiety. May refill on or after Jan 09 2016.  . conjugated estrogens (PREMARIN) vaginal cream Place 1 Applicatorful vaginally daily.  [name] diltiazem (DILACOR XR) 240 MG 24 hr capsule Take 1 capsule (240 mg total) by mouth daily.  . famotidine (PEPCID) 20 MG tablet Take 20 mg by mouth daily.  . fluticasone (FLONASE) 50 MCG/ACT nasal spray SHAKE LIQUID AND USE 2 SPRAYS IN EACH NOSTRIL DAILY AS NEEDED FOR ALLERGIES  . lisinopril-hydrochlorothiazide (PRINZIDE,ZESTORETIC) 10-12.5 MG tablet Take 1 tablet by mouth daily.  . metFORMIN (GLUCOPHAGE) 500 MG tablet Take 2 tablets (1,000 mg total) by mouth 2 (two) times daily with a meal.  . ONETOUCH DELICA LANCETS 22Q MISC USE TO TEST TWICE DAILY AS DIRECTED  . ONETOUCH VERIO test strip USE TO CHECK BLOOD GLUCOSE TWICE DAILY AS DIRECTED  . Probiotic Product (PROBIOTIC DAILY PO) Take 1 capsule by mouth daily. Reported on 08/29/2015  . trimethoprim (TRIMPEX) 100 MG tablet TAKE 1 TABLET(100 MG) BY MOUTH DAILY  . [DISCONTINUED] ciprofloxacin (CIPRO) 250 MG tablet Take 1 tablet (250 mg total) by mouth 2 (two) times daily.     Allergies:    Chlorhexidine gluconate [chlorhexidine]; Codeine; Cortisone; Doxycycline hyclate; Statins; and Sulfonamide derivatives   Past Medical History:  Diagnosis Date  . Allergy   . Anxiety   . Arthritis   . Atrial enlargement, left   . Depression   . Diabetes mellitus without complication (HCC)    fasting cbg 110s  . GERD (gastroesophageal reflux disease)   . H/O hiatal hernia   . Headache(784.0)   . Heart murmur    06/10/2014 seeing new cardiologist  . Heart murmur   . HOH (hard of hearing)    HOH in left ear; needs to speak to patient in right ear  . Hyperlipidemia   . Hypertension   . Mitral regurgitation   . Mitral valve prolapse   . Palpitations   . PONV (postoperative nausea and vomiting)    Patient stated "I like the patch behind my ear"  . 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 UTI (lower urinary tract infection)     Past Surgical History:  Procedure Laterality Date  . ABDOMINAL HYSTERECTOMY    . Anterior posterior and enterocele repairs  09/21/2004   With uterosacral cardinal colposuspension, partial colpocleisis; Selinda Orion, MD  . BACK SURGERY    . BREAST ENHANCEMENT SURGERY  02/25/2002   Bilateral reduction and excision of  accessory breast tissue underneath the left breast; Howard Holderness Jr., MD  . BREAST SURGERY  11   reduction  . CARDIAC CATHETERIZATION     no PCI  . CATARACT EXTRACTION Bilateral   . COLONOSCOPY    . EYE SURGERY Bilateral    cataract removal  . LUMBAR LAMINECTOMY/ DECOMPRESSION WITH MET-RX N/A 03/16/2014   Procedure: LUMBAR FIVE-SACRAL ONE EXTRAFORAMINAL DISKECTOMY WITH METREX;  Surgeon: Henry Elsner, MD;  Location: [facility];  Service: Neurosurgery;  Laterality: N/A;  . LUMBAR LAMINECTOMY/DECOMPRESSION MICRODISCECTOMY Left 06/08/2014   Procedure: Left Lumbar Five-Sacral One Microdiskectomy;  Surgeon: Henry Elsner, MD;  Location: [facility];  Service: Neurosurgery;  Laterality: Left;  Left L5-S1 Microdiskectomy  . PELVIC FLOOR REPAIR    . POSTERIOR  LUMBAR FUSION 4 LEVEL N/A 08/08/2015   Procedure: T8-L1 posterior lateral fusion with decompression T12-L1;  Surgeon: Henry Elsner, MD;  Location: [facility];  Service: Neurosurgery;  Laterality: N/A;  T8-L1 posterior lateral fusion with decompression T12-L1  . ROTATOR CUFF REPAIR Bilateral    11.12  . TONSILLECTOMY AND ADENOIDECTOMY       Social History:  The patient  reports that she has never smoked. She has never used smokeless tobacco. She reports that she drinks alcohol. She reports that she uses drugs, including Marijuana.   Family History:  The patient's family history includes Arthritis in her father and mother; Heart disease in her father; Heart failure in her father; Hyperlipidemia in her father and mother; Hypertension in her mother.    ROS:  Please see the history of present illness. All other systems are reviewed and  Negative to the above problem except as noted.    PHYSICAL EXAM: VS:  BP 108/70   Pulse 77   Ht 5' 6.5" (1.689 m)   Wt 59.4 kg (131 lb)   SpO2 98%   BMI 20.83 kg/m   GEN: Well nourished, well developed, in no acute distress  HEENT: normal  Neck: no JVD, carotid bruits, or masses Cardiac: RRR; II/VI systolic murmur left lateral chest  , rubs, or gallops,no edema  Respiratory:  clear to auscultation bilaterally, normal work of breathing GI: soft, nontender, nondistended, + BS  No hepatomegaly  MS: no deformity Moving all extremities   Skin: warm and dry, no rash Neuro:  Strength and sensation are intact Psych: euthymic mood, full affect   EKG:  EKG is ordered today. Ectopic atrial rhythm  77 bpm  Occasional PVC     Lipid Panel    Component Value Date/Time   CHOL 163 08/22/2016 0746   TRIG 101 08/22/2016 0746   HDL 65 08/22/2016 0746   CHOLHDL 2.5 08/22/2016 0746   VLDL 20 08/22/2016 0746   LDLCALC 78 08/22/2016 0746   LDLDIRECT 143.6 06/04/2012 0931      Wt Readings from Last 3 Encounters:  02/15/17 59.4 kg (131 lb)  02/12/17 60 kg  (132 lb 4.8 oz)  02/06/17 60.6 kg (133 lb 11.2 oz)      ASSESSMENT AND PLAN:  1  Mitral regurg  I have reviewed echo with pt  Discussed how it has progressed some  She is reluctant to proceed  "I am too old for this "  I will let her reflect on this  Discussed TEE Reviewed what would happen if repaired     Current medicines are reviewed at length with the patient today.  The patient does not have concerns regarding medicines.  Signed, Paula Ross, MD  02/15/2017   9:36 AM    [facility] [address], [place], [place]  [address] Phone: [phone]; Fax: [phone]    

## 2017-02-15 ENCOUNTER — Ambulatory Visit (INDEPENDENT_AMBULATORY_CARE_PROVIDER_SITE_OTHER): Payer: Medicare Other | Admitting: Internal Medicine

## 2017-02-15 ENCOUNTER — Encounter: Payer: Self-pay | Admitting: Internal Medicine

## 2017-02-15 ENCOUNTER — Encounter (INDEPENDENT_AMBULATORY_CARE_PROVIDER_SITE_OTHER): Payer: Self-pay

## 2017-02-15 VITALS — BP 108/70 | HR 77 | Ht 66.5 in | Wt 131.0 lb

## 2017-02-15 DIAGNOSIS — I10 Essential (primary) hypertension: Secondary | ICD-10-CM

## 2017-02-15 DIAGNOSIS — I080 Rheumatic disorders of both mitral and aortic valves: Secondary | ICD-10-CM

## 2017-02-15 MED ORDER — LISINOPRIL-HYDROCHLOROTHIAZIDE 10-12.5 MG PO TABS: 1.0000 | ORAL_TABLET | Freq: Every day | ORAL | 3 refills | Status: DC

## 2017-02-15 MED ORDER — DILTIAZEM HCL ER 240 MG PO CP24: 240.0000 mg | ORAL_CAPSULE | Freq: Every day | ORAL | 3 refills | Status: DC

## 2017-02-15 NOTE — Patient Instructions (Signed)
Medication Instructions:  Your physician recommends that you continue on your current medications as directed. Please refer to the Current Medication list given to you today.   Labwork: None   Testing/Procedures: None   Follow-Up: Follow-up with Dr Harrington Challenger to be determined.   Any Other Special Instructions Will Be Listed Below (If Applicable).  Dr Harrington Challenger will review your echocardiogram and let you know about a follow-up appointment.   If you need a refill on your cardiac medications before your next appointment, please call your pharmacy.

## 2017-02-22 ENCOUNTER — Telehealth: Payer: Self-pay | Admitting: Internal Medicine

## 2017-02-22 NOTE — Telephone Encounter (Signed)
New message   Pt verbalized that she was told by rn that she will get a call for her ECHO results

## 2017-02-22 NOTE — Telephone Encounter (Signed)
Patient reports she spoke with Dr. Harrington Challenger about her ECHO results and was told she would be called to schedule further testing.   Per echo report, "Impressions:  - Consider TEE, especially if the patient is a surgical candidate   for MV repair. MR severity may be underestimated."  Reviewed Dr. Harrington Challenger' RNs in-basket and see no orders/instructions.   Forwarding to Dr. Harrington Challenger for orders. Patient understands she will be called with instructions.

## 2017-02-25 NOTE — Telephone Encounter (Signed)
OK to go ahead and set pt up for TEE to evaluate MR

## 2017-02-25 NOTE — Telephone Encounter (Signed)
Follow up   Pt verbalized that she is waiting for rn call

## 2017-02-25 NOTE — Telephone Encounter (Signed)
Spoke with pt and advised her we were just waiting to hear from Dr. Harrington Challenger on what all we need to order.  Advised we would call once we heard back from Dr. Harrington Challenger.  Pt verbalized understanding.

## 2017-02-26 NOTE — Telephone Encounter (Signed)
Called patient and made her aware that she is scheduled for a TEE to evaluate her MR on Monday June 25th at 9:00 AM with Dr. Harrington Challenger. Patient made aware that she will need to arrive at 7:30 AM. Pre-procedure instructions reviewed with the patient. Patient verbalizes understanding and is in agreement with this plan.

## 2017-03-04 ENCOUNTER — Ambulatory Visit (HOSPITAL_COMMUNITY)
Admission: RE | Admit: 2017-03-04 | Discharge: 2017-03-04 | Disposition: A | Discharge status: Home / Self Care | Payer: Medicare Other | Source: Ambulatory Visit | Attending: Internal Medicine | Admitting: Internal Medicine

## 2017-03-04 ENCOUNTER — Encounter (HOSPITAL_COMMUNITY)
Admission: RE | Disposition: A | Discharge status: Home / Self Care | Payer: Self-pay | Source: Ambulatory Visit | Attending: Internal Medicine

## 2017-03-04 ENCOUNTER — Ambulatory Visit (HOSPITAL_BASED_OUTPATIENT_CLINIC_OR_DEPARTMENT_OTHER): Payer: Medicare Other

## 2017-03-04 ENCOUNTER — Telehealth: Payer: Self-pay | Admitting: *Deleted

## 2017-03-04 ENCOUNTER — Encounter (HOSPITAL_COMMUNITY): Payer: Self-pay | Admitting: *Deleted

## 2017-03-04 DIAGNOSIS — I10 Essential (primary) hypertension: Secondary | ICD-10-CM | POA: Diagnosis not present

## 2017-03-04 DIAGNOSIS — K219 Gastro-esophageal reflux disease without esophagitis: Secondary | ICD-10-CM | POA: Diagnosis not present

## 2017-03-04 DIAGNOSIS — F419 Anxiety disorder, unspecified: Secondary | ICD-10-CM | POA: Insufficient documentation

## 2017-03-04 DIAGNOSIS — I341 Nonrheumatic mitral (valve) prolapse: Secondary | ICD-10-CM | POA: Insufficient documentation

## 2017-03-04 DIAGNOSIS — Z882 Allergy status to sulfonamides status: Secondary | ICD-10-CM | POA: Diagnosis not present

## 2017-03-04 DIAGNOSIS — I4891 Unspecified atrial fibrillation: Secondary | ICD-10-CM | POA: Diagnosis not present

## 2017-03-04 DIAGNOSIS — Z7951 Long term (current) use of inhaled steroids: Secondary | ICD-10-CM | POA: Diagnosis not present

## 2017-03-04 DIAGNOSIS — E785 Hyperlipidemia, unspecified: Secondary | ICD-10-CM | POA: Diagnosis not present

## 2017-03-04 DIAGNOSIS — Z7984 Long term (current) use of oral hypoglycemic drugs: Secondary | ICD-10-CM | POA: Insufficient documentation

## 2017-03-04 DIAGNOSIS — Z8249 Family history of ischemic heart disease and other diseases of the circulatory system: Secondary | ICD-10-CM | POA: Diagnosis not present

## 2017-03-04 DIAGNOSIS — E119 Type 2 diabetes mellitus without complications: Secondary | ICD-10-CM | POA: Diagnosis not present

## 2017-03-04 DIAGNOSIS — F329 Major depressive disorder, single episode, unspecified: Secondary | ICD-10-CM | POA: Insufficient documentation

## 2017-03-04 DIAGNOSIS — R7611 Nonspecific reaction to tuberculin skin test without active tuberculosis: Secondary | ICD-10-CM | POA: Diagnosis not present

## 2017-03-04 DIAGNOSIS — I340 Nonrheumatic mitral (valve) insufficiency: Secondary | ICD-10-CM

## 2017-03-04 DIAGNOSIS — M1990 Unspecified osteoarthritis, unspecified site: Secondary | ICD-10-CM | POA: Diagnosis not present

## 2017-03-04 HISTORY — PX: TEE WITHOUT CARDIOVERSION: SHX5443

## 2017-03-04 LAB — CBC
HEMATOCRIT: 33.5 % — AB (ref 36.0–46.0)
HEMOGLOBIN: 10.9 g/dL — AB (ref 12.0–15.0)
MCH: 30.8 pg (ref 26.0–34.0)
MCHC: 32.5 g/dL (ref 30.0–36.0)
MCV: 94.6 fL (ref 78.0–100.0)
Platelets: 300 10*3/uL (ref 150–400)
RBC: 3.54 MIL/uL — ABNORMAL LOW (ref 3.87–5.11)
RDW: 13.6 % (ref 11.5–15.5)
WBC: 6.5 10*3/uL (ref 4.0–10.5)

## 2017-03-04 LAB — BASIC METABOLIC PANEL
ANION GAP: 7 (ref 5–15)
BUN: 11 mg/dL (ref 6–20)
CALCIUM: 8.7 mg/dL — AB (ref 8.9–10.3)
CHLORIDE: 105 mmol/L (ref 101–111)
CO2: 23 mmol/L (ref 22–32)
Creatinine, Ser: 0.63 mg/dL (ref 0.44–1.00)
GFR calc Af Amer: >60 mL/min (ref >60)
GFR calc non Af Amer: >60 mL/min (ref >60)
GLUCOSE: 108 mg/dL — AB (ref 65–99)
Potassium: 4.2 mmol/L (ref 3.5–5.1)
Sodium: 135 mmol/L (ref 135–145)

## 2017-03-04 LAB — GLUCOSE, CAPILLARY: Glucose-Capillary: 112 mg/dL — ABNORMAL HIGH (ref 65–99)

## 2017-03-04 SURGERY — ECHOCARDIOGRAM, TRANSESOPHAGEAL
Anesthesia: Moderate Sedation

## 2017-03-04 MED ORDER — DIPHENHYDRAMINE HCL 50 MG/ML IJ SOLN
INTRAMUSCULAR | Status: DC | PRN
  Administered 2017-03-04: 25 mg via INTRAVENOUS

## 2017-03-04 MED ORDER — LIDOCAINE VISCOUS 2 % MT SOLN
OROMUCOSAL | Status: DC | PRN
  Administered 2017-03-04: 10 mL via OROMUCOSAL

## 2017-03-04 MED ORDER — MIDAZOLAM HCL 10 MG/2ML IJ SOLN
INTRAMUSCULAR | Status: DC | PRN
  Administered 2017-03-04: 2 mg via INTRAVENOUS
  Administered 2017-03-04: 1 mg via INTRAVENOUS
  Administered 2017-03-04 (×2): 2 mg via INTRAVENOUS
  Administered 2017-03-04: 1 mg via INTRAVENOUS

## 2017-03-04 MED ORDER — RIVAROXABAN 20 MG PO TABS: 20.0000 mg | ORAL_TABLET | Freq: Every day | ORAL | 11 refills | Status: DC

## 2017-03-04 MED ORDER — FENTANYL CITRATE (PF) 100 MCG/2ML IJ SOLN
INTRAMUSCULAR | Status: DC | PRN
  Administered 2017-03-04: 12.5 ug via INTRAVENOUS
  Administered 2017-03-04: 25 ug via INTRAVENOUS
  Administered 2017-03-04 (×3): 12.5 ug via INTRAVENOUS

## 2017-03-04 MED ORDER — FENTANYL CITRATE (PF) 100 MCG/2ML IJ SOLN
INTRAMUSCULAR | Status: AC
  Filled 2017-03-04: qty 2

## 2017-03-04 MED ORDER — SODIUM CHLORIDE 0.9 % IV SOLN
INTRAVENOUS | Status: DC
  Administered 2017-03-04: 1000 mL via INTRAVENOUS

## 2017-03-04 MED ORDER — LIDOCAINE VISCOUS 2 % MT SOLN
OROMUCOSAL | Status: AC
  Filled 2017-03-04: qty 15

## 2017-03-04 MED ORDER — MIDAZOLAM HCL 5 MG/ML IJ SOLN
INTRAMUSCULAR | Status: AC
  Filled 2017-03-04: qty 2

## 2017-03-04 NOTE — Telephone Encounter (Signed)
Per notes recorded on patient's lab results, xarelto 20 mg sent to pharmacy.  Called patient to confirm which location.  She will take this evening.

## 2017-03-04 NOTE — Interval H&P Note (Signed)
History and Physical Interval Note:  03/04/2017 9:13 AM  [name]  has presented today for surgery, with the diagnosis of MITRAL REGURGITATION  The various methods of treatment have been discussed with the patient and family. After consideration of risks, benefits and other options for treatment, the patient has consented to  Procedure(s): TRANSESOPHAGEAL ECHOCARDIOGRAM (TEE) (N/A) as a surgical intervention .  The patient's history has been reviewed, patient examined, no change in status, stable for surgery.  I have reviewed the patient's chart and labs.  Questions were answered to the patient's satisfaction.     Dorris Carnes

## 2017-03-04 NOTE — Telephone Encounter (Signed)
-----   Message from Fay Records, MD sent at 03/04/2017  3:03 PM EDT ----- Patient needs Xarelto 20 mg daily  called into [facility] at [place]

## 2017-03-04 NOTE — Discharge Instructions (Signed)

## 2017-03-04 NOTE — H&P (View-Only) (Signed)
Cardiology Office Note   Date:  02/15/2017   ID:  JAMES LAFALCE, DOB [birth year], MRN [id number]  PCP:  Eulas Post, MD  Cardiologist:   Dorris Carnes, MD   F/U of HTN and MV dz      History of Present Illness: [name] is a 83 y.o. female with a history of HTN and MV dz (MVP/MR)  Echo in March 2017 Moderate MR I saw her in December 2017  Since seen some L ankle swelling  Breathing may be a little short when lays down at night  NOt when doing things Started using treadmill  Walks about 20 min  SLowly    No CP NO PND Occasional palpitations  Last a couple seconds    She says she burps and spells go away       Current Meds  Medication Sig  . citalopram (CELEXA) 20 MG tablet Take 1 tablet (20 mg total) by mouth daily.  . clonazePAM (KLONOPIN) 1 MG tablet Take one po qhs prn insomnia and severe anxiety. May refill on or after Jan 09 2016.  . conjugated estrogens (PREMARIN) vaginal cream Place 1 Applicatorful vaginally daily.  [name] diltiazem (DILACOR XR) 240 MG 24 hr capsule Take 1 capsule (240 mg total) by mouth daily.  . famotidine (PEPCID) 20 MG tablet Take 20 mg by mouth daily.  . fluticasone (FLONASE) 50 MCG/ACT nasal spray SHAKE LIQUID AND USE 2 SPRAYS IN EACH NOSTRIL DAILY AS NEEDED FOR ALLERGIES  . lisinopril-hydrochlorothiazide (PRINZIDE,ZESTORETIC) 10-12.5 MG tablet Take 1 tablet by mouth daily.  . metFORMIN (GLUCOPHAGE) 500 MG tablet Take 2 tablets (1,000 mg total) by mouth 2 (two) times daily with a meal.  . ONETOUCH DELICA LANCETS 22Q MISC USE TO TEST TWICE DAILY AS DIRECTED  . ONETOUCH VERIO test strip USE TO CHECK BLOOD GLUCOSE TWICE DAILY AS DIRECTED  . Probiotic Product (PROBIOTIC DAILY PO) Take 1 capsule by mouth daily. Reported on 08/29/2015  . trimethoprim (TRIMPEX) 100 MG tablet TAKE 1 TABLET(100 MG) BY MOUTH DAILY  . [DISCONTINUED] ciprofloxacin (CIPRO) 250 MG tablet Take 1 tablet (250 mg total) by mouth 2 (two) times daily.     Allergies:    Chlorhexidine gluconate [chlorhexidine]; Codeine; Cortisone; Doxycycline hyclate; Statins; and Sulfonamide derivatives   Past Medical History:  Diagnosis Date  . Allergy   . Anxiety   . Arthritis   . Atrial enlargement, left   . Depression   . Diabetes mellitus without complication (HCC)    fasting cbg 110s  . GERD (gastroesophageal reflux disease)   . H/O hiatal hernia   . Headache(784.0)   . Heart murmur    06/10/2014 seeing new cardiologist  . Heart murmur   . HOH (hard of hearing)    HOH in left ear; needs to speak to patient in right ear  . Hyperlipidemia   . Hypertension   . Mitral regurgitation   . Mitral valve prolapse   . Palpitations   . PONV (postoperative nausea and vomiting)    Patient stated "I like the patch behind my ear"  . 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 UTI (lower urinary tract infection)     Past Surgical History:  Procedure Laterality Date  . ABDOMINAL HYSTERECTOMY    . Anterior posterior and enterocele repairs  09/21/2004   With uterosacral cardinal colposuspension, partial colpocleisis; Selinda Orion, MD  . BACK SURGERY    . BREAST ENHANCEMENT SURGERY  02/25/2002   Bilateral reduction and excision of  accessory breast tissue underneath the left breast; Aretha Parrot., MD  . BREAST SURGERY  13   reduction  . CARDIAC CATHETERIZATION     no PCI  . CATARACT EXTRACTION Bilateral   . COLONOSCOPY    . EYE SURGERY Bilateral    cataract removal  . LUMBAR LAMINECTOMY/ DECOMPRESSION WITH MET-RX N/A 03/16/2014   Procedure: LUMBAR FIVE-SACRAL ONE EXTRAFORAMINAL DISKECTOMY WITH METREX;  Surgeon: Kristeen Miss, MD;  Location: [facility];  Service: Neurosurgery;  Laterality: N/A;  . LUMBAR LAMINECTOMY/DECOMPRESSION MICRODISCECTOMY Left 06/08/2014   Procedure: Left Lumbar Five-Sacral One Microdiskectomy;  Surgeon: Kristeen Miss, MD;  Location: [facility];  Service: Neurosurgery;  Laterality: Left;  Left L5-S1 Microdiskectomy  . PELVIC FLOOR REPAIR    . POSTERIOR  LUMBAR FUSION 4 LEVEL N/A 08/08/2015   Procedure: T8-L1 posterior lateral fusion with decompression T12-L1;  Surgeon: Kristeen Miss, MD;  Location: [facility];  Service: Neurosurgery;  Laterality: N/A;  T8-L1 posterior lateral fusion with decompression T12-L1  . ROTATOR CUFF REPAIR Bilateral    11.12  . TONSILLECTOMY AND ADENOIDECTOMY       Social History:  The patient  reports that she has never smoked. She has never used smokeless tobacco. She reports that she drinks alcohol. She reports that she uses drugs, including Marijuana.   Family History:  The patient's family history includes Arthritis in her father and mother; Heart disease in her father; Heart failure in her father; Hyperlipidemia in her father and mother; Hypertension in her mother.    ROS:  Please see the history of present illness. All other systems are reviewed and  Negative to the above problem except as noted.    PHYSICAL EXAM: VS:  BP 108/70   Pulse 77   Ht 5' 6.5" (1.689 m)   Wt 59.4 kg (131 lb)   SpO2 98%   BMI 20.83 kg/m   GEN: Well nourished, well developed, in no acute distress  HEENT: normal  Neck: no JVD, carotid bruits, or masses Cardiac: RRR; II/VI systolic murmur left lateral chest  , rubs, or gallops,no edema  Respiratory:  clear to auscultation bilaterally, normal work of breathing GI: soft, nontender, nondistended, + BS  No hepatomegaly  MS: no deformity Moving all extremities   Skin: warm and dry, no rash Neuro:  Strength and sensation are intact Psych: euthymic mood, full affect   EKG:  EKG is ordered today. Ectopic atrial rhythm  77 bpm  Occasional PVC     Lipid Panel    Component Value Date/Time   CHOL 163 08/22/2016 0746   TRIG 101 08/22/2016 0746   HDL 65 08/22/2016 0746   CHOLHDL 2.5 08/22/2016 0746   VLDL 20 08/22/2016 0746   LDLCALC 78 08/22/2016 0746   LDLDIRECT 143.6 06/04/2012 0931      Wt Readings from Last 3 Encounters:  02/15/17 59.4 kg (131 lb)  02/12/17 60 kg  (132 lb 4.8 oz)  02/06/17 60.6 kg (133 lb 11.2 oz)      ASSESSMENT AND PLAN:  1  Mitral regurg  I have reviewed echo with pt  Discussed how it has progressed some  She is reluctant to proceed  "I am too old for this "  I will let her reflect on this  Discussed TEE Reviewed what would happen if repaired     Current medicines are reviewed at length with the patient today.  The patient does not have concerns regarding medicines.  Signed, Dorris Carnes, MD  02/15/2017  9:36 AM    [facility] [address], [place], [place]  [address] Phone: [phone]; Fax: [phone]    

## 2017-03-04 NOTE — Op Note (Signed)
Full report to follow in CV section of chart 

## 2017-03-08 ENCOUNTER — Telehealth: Payer: Self-pay | Admitting: Internal Medicine

## 2017-03-08 DIAGNOSIS — Z01812 Encounter for preprocedural laboratory examination: Secondary | ICD-10-CM

## 2017-03-08 NOTE — Telephone Encounter (Signed)
Returned call to patient.  Verbalized she had a procedure earlier this week and Dr. Harrington Challenger was going to speak to a surgeon and get back to her to let her know what the next steps will be.   Verbalized she had a "scare" yesterday after walking around at the grocery store, began having palpitations and SOB, reports having to sit down for 20 mins for it to resolve (no vitals to report as she was at the store).  Denies CP.  Denies symptoms at current, patient is resting but is concerned and would like to know the plan of care.    Advised I would route to Dr. Harrington Challenger for review and we would call her to inform of recommendations.  Patient aware and verbalized understanding.    Per chart review: TEE 6/25 by Dr. Harrington Challenger.    No follow up currently scheduled.

## 2017-03-08 NOTE — Telephone Encounter (Signed)
Spoke to pt   Says she has had stomach cramps all week   Cut back on oral intake Yesterday felt a ltttle better   [place] out heart racing  SOB   NO diziness Today hasnt done much

## 2017-03-08 NOTE — Telephone Encounter (Signed)
New Message  Pt c/o Shortness Of Breath: STAT if SOB developed within the last 24 hours or pt is noticeably SOB on the phone  1. Are you currently SOB (can you hear that pt is SOB on the phone)? No   2. How long have you been experiencing SOB?  For a while; Dr. Harrington Challenger knows   3. Are you SOB when sitting or when up moving around? Moving around   4. Are you currently experiencing any other symptoms? No

## 2017-03-15 NOTE — Telephone Encounter (Signed)
Patient reported after TEE, Dr. Harrington Challenger told her she'd talk to a surgeon and get back to her with next steps and she has yet to be given instruction.  To Dr. Harrington Challenger for clarification.

## 2017-03-15 NOTE — Telephone Encounter (Signed)
F/u Message  Pt call to f/u with Dr. Harrington Challenger on speaking with the Surgeon at [facility]. Please call back to discusss

## 2017-03-18 NOTE — Telephone Encounter (Signed)
Received note from surgeon Would 1  Set up for R and L heart catheterization to evaluate pressures, rule out significant CAD 2 Set up for cliic visit after this with Dr Clydene Laming

## 2017-03-18 NOTE — Telephone Encounter (Signed)
Reviewed with Dr. Harrington Challenger who states patient will need to be seen by Dr. Guy Sandifer office before we schedule cath.

## 2017-03-18 NOTE — Telephone Encounter (Signed)
Follow Up:    Pt says she is ready to have her Cath,would like it asap please.

## 2017-03-18 NOTE — Telephone Encounter (Signed)
-----   Message from [name], MD sent at 03/18/2017  4:30 PM EDT ----- Given symptoms of DOE and new onset AF it's very reasonable to consider MVrepair and maze if she is interested in an aggressive approach.  She will need L+R cath. I can see her prior to or after cath if she wants a surgical opinion.  Just let me know.  Thx  ----- Message ----- From: Fay Records, MD Sent: 03/18/2017   3:26 PM To: [name], MD  Cub, You reveiwed this TEE a couple weeks ago  Pt does have new atrial fib  Complains of SOB I think MR is prob more severe than during study as she was sedated . Thoughts?

## 2017-03-19 NOTE — Telephone Encounter (Signed)
Spoke with patient and informed.  She is available anytime/day.  Advised I will call her back with details once scheduled.

## 2017-03-20 ENCOUNTER — Encounter: Payer: Self-pay | Admitting: *Deleted

## 2017-03-20 NOTE — Telephone Encounter (Signed)
Scheduled patient for left and right heart catheterization with Dr. Saunders Revel on Thursday July 19 at 7:30 am.  Pt will come for labs on 7/13 and pick up instruction letter at that time.  We discussed holding Xarelto for 48 hrs prior and metformin for 24 hrs prior.

## 2017-03-21 ENCOUNTER — Other Ambulatory Visit: Payer: Medicare Other | Admitting: *Deleted

## 2017-03-21 DIAGNOSIS — Z01812 Encounter for preprocedural laboratory examination: Secondary | ICD-10-CM

## 2017-03-22 ENCOUNTER — Other Ambulatory Visit: Payer: Medicare Other

## 2017-03-22 LAB — BASIC METABOLIC PANEL
BUN/Creatinine Ratio: 17 (ref 12–28)
BUN: 13 mg/dL (ref 8–27)
CALCIUM: 9.7 mg/dL (ref 8.7–10.3)
CO2: 21 mmol/L (ref 20–29)
CREATININE: 0.76 mg/dL (ref 0.57–1.00)
Chloride: 97 mmol/L (ref 96–106)
GFR calc Af Amer: 85 mL/min/{1.73_m2} (ref >59)
GFR calc non Af Amer: 74 mL/min/{1.73_m2} (ref >59)
GLUCOSE: 83 mg/dL (ref 65–99)
Potassium: 4.7 mmol/L (ref 3.5–5.2)
Sodium: 136 mmol/L (ref 134–144)

## 2017-03-22 LAB — CBC
HEMATOCRIT: 34.6 % (ref 34.0–46.6)
Hemoglobin: 11 g/dL — ABNORMAL LOW (ref 11.1–15.9)
MCH: 30.3 pg (ref 26.6–33.0)
MCHC: 31.8 g/dL (ref 31.5–35.7)
MCV: 95 fL (ref 79–97)
Platelets: 462 10*3/uL — ABNORMAL HIGH (ref 150–379)
RBC: 3.63 x10E6/uL — AB (ref 3.77–5.28)
RDW: 13.9 % (ref 12.3–15.4)
WBC: 7.7 10*3/uL (ref 3.4–10.8)

## 2017-03-22 LAB — PROTIME-INR
INR: 1.1 (ref 0.8–1.2)
Prothrombin Time: 11.6 s (ref 9.1–12.0)

## 2017-03-25 ENCOUNTER — Telehealth: Payer: Self-pay | Admitting: *Deleted

## 2017-03-25 NOTE — Telephone Encounter (Signed)
Pt has been notified of lab results by phone with verbal understanding. Pt aware ok to proceed with cath 7/19 with Dr. Saunders Revel. Pt thanked me for my call today.

## 2017-03-25 NOTE — Telephone Encounter (Signed)
-----   Message from Fay Records, MD sent at 03/22/2017  5:10 PM EDT ----- Jearl Klinefelter labs OK

## 2017-03-27 ENCOUNTER — Telehealth: Payer: Self-pay

## 2017-03-27 NOTE — Telephone Encounter (Signed)
Patient contacted pre-catheterization at [facility] scheduled for:  03/28/2017 @ 0730 Verified arrival time and place:  NT @ 0530 Confirmed AM meds to be taken pre-cath with sip of water:  Per Pt, she has stopped taking her Xarelto and Metformin as instructed.  Notified Pt to take a baby ASA prior to arrival.  Instructed Pt she could hold her lisinopril/hctz before procedure for comfort.  Pt seemed to want to take it.  I instructed Pt it was purely for her comfort, Pt indicates understanding.   Confirmed patient has responsible person to drive home post procedure and observe patient for 24 hours:  yes Addl concerns:   Pt has allergy to CHG, statins and codeine

## 2017-03-28 ENCOUNTER — Ambulatory Visit (HOSPITAL_COMMUNITY)
Admission: RE | Admit: 2017-03-28 | Discharge: 2017-03-28 | Disposition: A | Discharge status: Home / Self Care | Payer: Medicare Other | Source: Ambulatory Visit | Attending: Internal Medicine | Admitting: Internal Medicine

## 2017-03-28 ENCOUNTER — Encounter (HOSPITAL_COMMUNITY): Payer: Self-pay | Admitting: Internal Medicine

## 2017-03-28 ENCOUNTER — Encounter (HOSPITAL_COMMUNITY)
Admission: RE | Disposition: A | Discharge status: Home / Self Care | Payer: Self-pay | Source: Ambulatory Visit | Attending: Internal Medicine

## 2017-03-28 DIAGNOSIS — M1990 Unspecified osteoarthritis, unspecified site: Secondary | ICD-10-CM | POA: Insufficient documentation

## 2017-03-28 DIAGNOSIS — I341 Nonrheumatic mitral (valve) prolapse: Secondary | ICD-10-CM | POA: Insufficient documentation

## 2017-03-28 DIAGNOSIS — Z7984 Long term (current) use of oral hypoglycemic drugs: Secondary | ICD-10-CM | POA: Insufficient documentation

## 2017-03-28 DIAGNOSIS — I2582 Chronic total occlusion of coronary artery: Secondary | ICD-10-CM | POA: Diagnosis not present

## 2017-03-28 DIAGNOSIS — Z882 Allergy status to sulfonamides status: Secondary | ICD-10-CM | POA: Insufficient documentation

## 2017-03-28 DIAGNOSIS — K219 Gastro-esophageal reflux disease without esophagitis: Secondary | ICD-10-CM | POA: Insufficient documentation

## 2017-03-28 DIAGNOSIS — R7611 Nonspecific reaction to tuberculin skin test without active tuberculosis: Secondary | ICD-10-CM | POA: Diagnosis not present

## 2017-03-28 DIAGNOSIS — I481 Persistent atrial fibrillation: Secondary | ICD-10-CM | POA: Diagnosis not present

## 2017-03-28 DIAGNOSIS — F419 Anxiety disorder, unspecified: Secondary | ICD-10-CM | POA: Diagnosis not present

## 2017-03-28 DIAGNOSIS — I10 Essential (primary) hypertension: Secondary | ICD-10-CM | POA: Insufficient documentation

## 2017-03-28 DIAGNOSIS — I2510 Atherosclerotic heart disease of native coronary artery without angina pectoris: Secondary | ICD-10-CM

## 2017-03-28 DIAGNOSIS — Z7951 Long term (current) use of inhaled steroids: Secondary | ICD-10-CM | POA: Diagnosis not present

## 2017-03-28 DIAGNOSIS — E785 Hyperlipidemia, unspecified: Secondary | ICD-10-CM | POA: Diagnosis not present

## 2017-03-28 DIAGNOSIS — F329 Major depressive disorder, single episode, unspecified: Secondary | ICD-10-CM | POA: Diagnosis not present

## 2017-03-28 DIAGNOSIS — E119 Type 2 diabetes mellitus without complications: Secondary | ICD-10-CM | POA: Insufficient documentation

## 2017-03-28 DIAGNOSIS — R0602 Shortness of breath: Secondary | ICD-10-CM | POA: Diagnosis present

## 2017-03-28 DIAGNOSIS — I340 Nonrheumatic mitral (valve) insufficiency: Secondary | ICD-10-CM | POA: Diagnosis not present

## 2017-03-28 DIAGNOSIS — I080 Rheumatic disorders of both mitral and aortic valves: Secondary | ICD-10-CM | POA: Diagnosis present

## 2017-03-28 DIAGNOSIS — Z7901 Long term (current) use of anticoagulants: Secondary | ICD-10-CM | POA: Diagnosis not present

## 2017-03-28 HISTORY — PX: RIGHT/LEFT HEART CATH AND CORONARY ANGIOGRAPHY: CATH118266

## 2017-03-28 LAB — POCT I-STAT 3, VENOUS BLOOD GAS (G3P V)
ACID-BASE EXCESS: 1 mmol/L (ref 0.0–2.0)
BICARBONATE: 24.9 mmol/L (ref 20.0–28.0)
O2 SAT: 63 %
PH VEN: 7.425 (ref 7.250–7.430)
PO2 VEN: 32 mmHg (ref 32.0–45.0)
TCO2: 26 mmol/L (ref 0–100)
pCO2, Ven: 38 mmHg — ABNORMAL LOW (ref 44.0–60.0)

## 2017-03-28 LAB — POCT I-STAT 3, ART BLOOD GAS (G3+)
Bicarbonate: 23.1 mmol/L (ref 20.0–28.0)
O2 SAT: 95 %
PCO2 ART: 33.2 mmHg (ref 32.0–48.0)
TCO2: 24 mmol/L (ref 0–100)
pH, Arterial: 7.451 — ABNORMAL HIGH (ref 7.350–7.450)
pO2, Arterial: 69 mmHg — ABNORMAL LOW (ref 83.0–108.0)

## 2017-03-28 LAB — GLUCOSE, CAPILLARY
Glucose-Capillary: 140 mg/dL — ABNORMAL HIGH (ref 65–99)
Glucose-Capillary: 127 mg/dL — ABNORMAL HIGH (ref 65–99)

## 2017-03-28 SURGERY — RIGHT/LEFT HEART CATH AND CORONARY ANGIOGRAPHY
Anesthesia: LOCAL

## 2017-03-28 MED ORDER — HEPARIN (PORCINE) IN NACL 2-0.9 UNIT/ML-% IJ SOLN
INTRAMUSCULAR | Status: AC
  Filled 2017-03-28: qty 1000

## 2017-03-28 MED ORDER — SODIUM CHLORIDE 0.9 % IV SOLN
INTRAVENOUS | Status: DC
  Administered 2017-03-28: 06:00:00 via INTRAVENOUS

## 2017-03-28 MED ORDER — RIVAROXABAN 20 MG PO TABS: 20.0000 mg | ORAL_TABLET | Freq: Every day | ORAL | 11 refills | Status: DC

## 2017-03-28 MED ORDER — SODIUM CHLORIDE 0.9% FLUSH: 3.0000 mL | Freq: Two times a day (BID) | INTRAVENOUS | Status: DC

## 2017-03-28 MED ORDER — HEPARIN SODIUM (PORCINE) 1000 UNIT/ML IJ SOLN
INTRAMUSCULAR | Status: DC | PRN
  Administered 2017-03-28: 3000 [IU] via INTRAVENOUS

## 2017-03-28 MED ORDER — SODIUM CHLORIDE 0.9% FLUSH: 3.0000 mL | INTRAVENOUS | Status: DC | PRN

## 2017-03-28 MED ORDER — VERAPAMIL HCL 2.5 MG/ML IV SOLN
INTRAVENOUS | Status: DC | PRN
  Administered 2017-03-28: 10 mL via INTRA_ARTERIAL

## 2017-03-28 MED ORDER — HEPARIN SODIUM (PORCINE) 1000 UNIT/ML IJ SOLN
INTRAMUSCULAR | Status: AC
  Filled 2017-03-28: qty 1

## 2017-03-28 MED ORDER — LIDOCAINE HCL (PF) 1 % IJ SOLN
INTRAMUSCULAR | Status: DC | PRN
  Administered 2017-03-28: 2 mL via INTRADERMAL
  Administered 2017-03-28: 4 mL via INTRADERMAL

## 2017-03-28 MED ORDER — HEPARIN (PORCINE) IN NACL 2-0.9 UNIT/ML-% IJ SOLN
INTRAMUSCULAR | Status: AC | PRN
  Administered 2017-03-28: 1000 mL

## 2017-03-28 MED ORDER — METFORMIN HCL 500 MG PO TABS: 1000.0000 mg | ORAL_TABLET | Freq: Two times a day (BID) | ORAL | 3 refills | Status: DC

## 2017-03-28 MED ORDER — SODIUM CHLORIDE 0.9 % IV SOLN: INTRAVENOUS | Status: DC

## 2017-03-28 MED ORDER — LIDOCAINE HCL (PF) 1 % IJ SOLN
INTRAMUSCULAR | Status: AC
  Filled 2017-03-28: qty 30

## 2017-03-28 MED ORDER — FENTANYL CITRATE (PF) 100 MCG/2ML IJ SOLN
INTRAMUSCULAR | Status: DC | PRN
  Administered 2017-03-28 (×2): 25 ug via INTRAVENOUS

## 2017-03-28 MED ORDER — VERAPAMIL HCL 2.5 MG/ML IV SOLN
INTRAVENOUS | Status: AC
  Filled 2017-03-28: qty 2

## 2017-03-28 MED ORDER — ASPIRIN 81 MG PO CHEW: 81.0000 mg | CHEWABLE_TABLET | ORAL | Status: DC

## 2017-03-28 MED ORDER — MIDAZOLAM HCL 2 MG/2ML IJ SOLN
INTRAMUSCULAR | Status: DC | PRN
  Administered 2017-03-28 (×2): 1 mg via INTRAVENOUS

## 2017-03-28 MED ORDER — FENTANYL CITRATE (PF) 100 MCG/2ML IJ SOLN
INTRAMUSCULAR | Status: AC
  Filled 2017-03-28: qty 2

## 2017-03-28 MED ORDER — SODIUM CHLORIDE 0.9 % IV SOLN: 250.0000 mL | INTRAVENOUS | Status: DC | PRN

## 2017-03-28 MED ORDER — MIDAZOLAM HCL 2 MG/2ML IJ SOLN
INTRAMUSCULAR | Status: AC
  Filled 2017-03-28: qty 2

## 2017-03-28 MED ORDER — SODIUM CHLORIDE 0.9 % IV SOLN
250.0000 mL | INTRAVENOUS | Status: DC | PRN
  Administered 2017-03-28: 250 mL via INTRAVENOUS

## 2017-03-28 MED ORDER — IOPAMIDOL (ISOVUE-370) INJECTION 76%
INTRAVENOUS | Status: AC
  Filled 2017-03-28: qty 100

## 2017-03-28 MED ORDER — IOPAMIDOL (ISOVUE-370) INJECTION 76%
INTRAVENOUS | Status: DC | PRN
  Administered 2017-03-28: 60 mL via INTRA_ARTERIAL

## 2017-03-28 SURGICAL SUPPLY — 12 items
CATH 5FR JL3.5 JR4 ANG PIG MP (CATHETERS) ×2 IMPLANT
CATH BALLN WEDGE 5F 110CM (CATHETERS) ×2 IMPLANT
DEVICE RAD TR BAND REGULAR (VASCULAR PRODUCTS) ×2 IMPLANT
GLIDESHEATH SLEND SS 6F .021 (SHEATH) ×2 IMPLANT
GUIDEWIRE INQWIRE 1.5J.035X260 (WIRE) ×1 IMPLANT
INQWIRE 1.5J .035X260CM (WIRE) ×2
KIT HEART LEFT (KITS) ×2 IMPLANT
PACK CARDIAC CATHETERIZATION (CUSTOM PROCEDURE TRAY) ×2 IMPLANT
SHEATH GLIDE SLENDER 4/5FR (SHEATH) ×2 IMPLANT
TRANSDUCER W/STOPCOCK (MISCELLANEOUS) ×2 IMPLANT
TUBING CIL FLEX 10 FLL-RA (TUBING) ×2 IMPLANT
WIRE HI TORQ VERSACORE-J 145CM (WIRE) ×2 IMPLANT

## 2017-03-28 NOTE — Discharge Instructions (Signed)

## 2017-03-28 NOTE — H&P (Signed)
Cardiac Catheterization History and Physical:   Patient ID: [name]; MRN: [id number]; DOB: 22-Feb-1934   Admission date: 03/28/2017  Primary Care Provider: Eulas Post, MD Primary Cardiologist: Dorris Carnes, MD Primary Electrophysiologist:  None  Chief Complaint:  Shortness of breath  Patient Profile:   [name] is a 83 y.o. female with a history of Mitral valve disease and hypertension recently found to have moderate to severe mitral regurgitation planning to undergo mitral valve surgery.  History of Present Illness:   [name] has a long history of mitral valve prolapse with worsening mitral regurgitation. Over the last month, she has had more shortness of breath and orthopnea. She has also recently developed atrial fibrillation. She notes occasional "twinges" of chest pain that can occur at any time. She attributes these to acid reflux. Recent TEE showed normal EF with significant prolapse of both mitral valve leaflets and multiple regurgitant jets suggestive of 3-4+ MR. She is awaiting consultation with Dr. Roxy Manns to discuss surgical options for treatment of her mitral valve disease.  Past Medical History:  Diagnosis Date  . Allergy   . Anxiety   . Arthritis   . Atrial enlargement, left   . Depression   . Diabetes mellitus without complication (HCC)    fasting cbg 110s  . GERD (gastroesophageal reflux disease)   . H/O hiatal hernia   . Headache(784.0)   . Heart murmur    06/10/2014 seeing new cardiologist  . Heart murmur   . HOH (hard of hearing)    HOH in left ear; needs to speak to patient in right ear  . Hyperlipidemia   . Hypertension   . Mitral regurgitation   . Mitral valve prolapse   . Palpitations   . PONV (postoperative nausea and vomiting)    Patient stated "I like the patch behind my ear"  . 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 UTI (lower urinary tract infection)     Past Surgical History:  Procedure Laterality Date  . ABDOMINAL HYSTERECTOMY    .  Anterior posterior and enterocele repairs  09/21/2004   With uterosacral cardinal colposuspension, partial colpocleisis; Selinda Orion, MD  . BACK SURGERY    . BREAST ENHANCEMENT SURGERY  02/25/2002   Bilateral reduction and excision of accessory breast tissue underneath the left breast; Aretha Parrot., MD  . BREAST SURGERY  11   reduction  . CARDIAC CATHETERIZATION     no PCI  . CATARACT EXTRACTION Bilateral   . COLONOSCOPY    . EYE SURGERY Bilateral    cataract removal  . LUMBAR LAMINECTOMY/ DECOMPRESSION WITH MET-RX N/A 03/16/2014   Procedure: LUMBAR FIVE-SACRAL ONE EXTRAFORAMINAL DISKECTOMY WITH METREX;  Surgeon: Kristeen Miss, MD;  Location: [facility];  Service: Neurosurgery;  Laterality: N/A;  . LUMBAR LAMINECTOMY/DECOMPRESSION MICRODISCECTOMY Left 06/08/2014   Procedure: Left Lumbar Five-Sacral One Microdiskectomy;  Surgeon: Kristeen Miss, MD;  Location: [facility];  Service: Neurosurgery;  Laterality: Left;  Left L5-S1 Microdiskectomy  . PELVIC FLOOR REPAIR    . POSTERIOR LUMBAR FUSION 4 LEVEL N/A 08/08/2015   Procedure: T8-L1 posterior lateral fusion with decompression T12-L1;  Surgeon: Kristeen Miss, MD;  Location: [facility];  Service: Neurosurgery;  Laterality: N/A;  T8-L1 posterior lateral fusion with decompression T12-L1  . ROTATOR CUFF REPAIR Bilateral    11.12  . TEE WITHOUT CARDIOVERSION N/A 03/04/2017   Procedure: TRANSESOPHAGEAL ECHOCARDIOGRAM (TEE);  Surgeon: Fay Records, MD;  Location: [facility];  Service: Cardiovascular;  Laterality: N/A;  . TONSILLECTOMY AND ADENOIDECTOMY       Medications Prior to Admission: Prior to Admission medications   Medication Sig Start Date Caty Tessler Date Taking? Authorizing Provider  bisacodyl (DULCOLAX) 5 MG EC tablet Take 10 mg by mouth daily as needed (for constipation.).   Yes [provider]  citalopram (CELEXA) 20 MG tablet Take 1 tablet (20 mg total) by mouth daily. 02/06/17  Yes Burchette, Alinda Sierras, MD    clonazePAM (KLONOPIN) 1 MG tablet Take one po qhs prn insomnia and severe anxiety. May refill on or after Jan 09 2016. Patient taking differently: Take 1 mg by mouth at bedtime. May refill on or after Jan 09 2016. 02/06/17  Yes Burchette, Alinda Sierras, MD  conjugated estrogens (PREMARIN) vaginal cream Place 1 Applicatorful vaginally daily. Patient taking differently: Place 1 Applicatorful vaginally once a week.  02/06/17  Yes Burchette, Alinda Sierras, MD  diltiazem (DILACOR XR) 240 MG 24 hr capsule Take 1 capsule (240 mg total) by mouth daily. 02/15/17  Yes Fay Records, MD  famotidine (PEPCID) 20 MG tablet Take 20 mg by mouth daily as needed for heartburn or indigestion.    Yes [provider]  ibuprofen (ADVIL,MOTRIN) 200 MG tablet Take 400 mg by mouth every 8 (eight) hours as needed (for pain.).   Yes [provider]  lisinopril-hydrochlorothiazide (PRINZIDE,ZESTORETIC) 10-12.5 MG tablet Take 1 tablet by mouth daily. 02/15/17  Yes Fay Records, MD  metFORMIN (GLUCOPHAGE) 500 MG tablet Take 2 tablets (1,000 mg total) by mouth 2 (two) times daily with a meal. 02/06/17  Yes Burchette, Alinda Sierras, MD  Multiple Vitamins-Minerals (ICAPS AREDS 2) CAPS Take 1 capsule by mouth 2 (two) times daily.   Yes [provider]  Jonetta Speak LANCETS 71I MISC USE TO TEST TWICE DAILY AS DIRECTED 11/13/16  Yes Burchette, Alinda Sierras, MD  ONETOUCH VERIO test strip USE TO CHECK BLOOD GLUCOSE TWICE DAILY AS DIRECTED 05/30/16  Yes Burchette, Alinda Sierras, MD  OVER THE COUNTER MEDICATION Take 1 tablet by mouth daily as needed (for allergies.). Unknown otc allergy relief tablet   Yes [provider]  Probiotic Product (PROBIOTIC DAILY PO) Take 1-2 capsules by mouth 2 (two) times daily. Take 2 capsules in the morning & 1 capsule at night   Yes [provider]  trimethoprim (TRIMPEX) 100 MG tablet TAKE 1 TABLET(100 MG) BY MOUTH DAILY 10/29/16  Yes Burchette, Alinda Sierras, MD  fluticasone (FLONASE) 50 MCG/ACT nasal  spray SHAKE LIQUID AND USE 2 SPRAYS IN EACH NOSTRIL DAILY AS NEEDED FOR ALLERGIES Patient not taking: Reported on 03/26/2017 09/14/16   Eulas Post, MD  rivaroxaban (XARELTO) 20 MG TABS tablet Take 1 tablet (20 mg total) by mouth daily with supper. 03/04/17   Fay Records, MD     Allergies:    Allergies  Allergen Reactions  . Chlorhexidine Gluconate [Chlorhexidine] Itching    Pt is unfamiliar with this allergy  . Codeine Swelling    Facial swelling  . Cortisone     Insomnia, heart palpitations (po only)  . Doxycycline Hyclate Nausea Only    Pt states she CAN tolerate  . Statins Other (See Comments)    Myalgias   . Sulfonamide Derivatives     As a child    Social History:   Social History   Social History  . Marital status: Married    Spouse name: N/A  . Number of children: N/A  . Years of education: N/A  Occupational History  . Retired Retired   Social History Main Topics  . Smoking status: Never Smoker  . Smokeless tobacco: Never Used  . Alcohol use Yes     Comment: newly discovered diabetes, she has stopped her alcohol intake; rare alcohol intake  . Drug use: Yes    Types: Marijuana  . Sexual activity: Not on file   Other Topics Concern  . Not on file   Social History Narrative   Married   Regular exercise    Family History:  The patient's family history includes Arthritis in her father and mother; Heart disease in her father; Heart failure in her father; Hyperlipidemia in her father and mother; Hypertension in her mother.    ROS:  Please see the history of present illness.  All other ROS reviewed and negative.     Physical Exam/Data:   Vitals:   03/28/17 0540  BP: (!) 125/50  Pulse: 73  Temp: 97.6 F (36.4 C)  TempSrc: Oral  SpO2: 99%  Weight: 130 lb (59 kg)  Height: 5' 6.5" (1.689 m)   No intake or output data in the 24 hours ending 03/28/17 0723 Filed Weights   03/28/17 0540  Weight: 130 lb (59 kg)   Body mass index is 20.67 kg/m.   General:  Well nourished, well developed, in no acute distress. HEENT: normal Lymph: no adenopathy Neck: no JVD Endocrine:  No thryomegaly Vascular: No carotid bruits; FA pulses 2+ bilaterally without bruits  Cardiac:  normal S1, S2; RRR; 1/6 systolic murmur at the apex. Lungs:  clear to auscultation bilaterally, no wheezing, rhonchi or rales  Abd: soft, nontender, no hepatomegaly  Ext: no edema Musculoskeletal:  No deformities, BUE and BLE strength normal and equal Skin: warm and dry  Neuro:  no focal abnormalities noted Psych:  anxious   EKG:  The ECG that was done 03/04/17 was personally reviewed and demonstrates atrial fibrillation with rapid ventricular response and inferolateral ST/T changes.  Laboratory Data:  Chemistry Recent Labs Lab 03/21/17 1219  NA 136  K 4.7  CL 97  CO2 21  GLUCOSE 83  BUN 13  CREATININE 0.76  CALCIUM 9.7  GFRNONAA 74  GFRAA 85    No results for input(s): PROT, ALBUMIN, AST, ALT, ALKPHOS, BILITOT in the last 168 hours. Hematology Recent Labs Lab 03/21/17 1219  WBC 7.7  RBC 3.63*  HGB 11.0*  HCT 34.6  MCV 95  MCH 30.3  MCHC 31.8  RDW 13.9  PLT 462*   Cardiac EnzymesNo results for input(s): TROPONINI in the last 168 hours. No results for input(s): TROPIPOC in the last 168 hours.  BNPNo results for input(s): BNP, PROBNP in the last 168 hours.  DDimer No results for input(s): DDIMER in the last 168 hours.  Radiology/Studies:  No results found.  Assessment and Plan:  Mitral valve prolapse with mitral regurgitation Moderate to severe MR noted on recent TEE. Given progressive shortness of breath and recent onset of atrial fibrillation, she is awaiting consultation with Dr. Roxy Manns to discuss surgical options. As part of this, she has been referred for left and right heart catheterization to assess her hemodynamics and coronary anatomy in anticipation of cardiac surgery. We have discussed the risks and benefits of the procedure and have  agreed to proceed.  Persistent atrial fibrillation Has been present for at least the last few months. She is currently on rivaroxaban, having last taken 4 days ago.  Cath Lab Visit (complete for each Cath Lab visit)  Clinical Evaluation Leading to the Procedure:   ACS: No.  Non-ACS:    Anginal Classification: CCS III  Anti-ischemic medical therapy: Minimal Therapy (1 class of medications)  Non-Invasive Test Results: No non-invasive testing performed  Prior CABG: No previous CABG  Signed, Nelva Bush, MD  03/28/2017 7:23 AM

## 2017-03-28 NOTE — Progress Notes (Signed)
Site area: right brachial vein sheath  Site Prior to Removal:  Level 0 Pressure Applied For:10 minutes  Manual:   Yes  Patient Status During Pull:  VSS Post Pull Site:  Level 0 Post Pull Instructions Given:  Yes  Post Pull Pulses Present: yes Dressing Applied:  Gauze / tegaderm  Bedrest begins @ na Comments:

## 2017-04-01 ENCOUNTER — Telehealth: Payer: Self-pay | Admitting: Internal Medicine

## 2017-04-01 DIAGNOSIS — I340 Nonrheumatic mitral (valve) insufficiency: Secondary | ICD-10-CM

## 2017-04-01 NOTE — Telephone Encounter (Signed)
Pt aware I will place referral to Dr Roxy Manns, his office should contact her with appointment.

## 2017-04-01 NOTE — Telephone Encounter (Signed)
New message    Pt is calling asking for a call back from RN. She said it's about surgery she needs. Please call.

## 2017-04-01 NOTE — Telephone Encounter (Signed)
1. Aggressive secondary prevention of CAD and medical therapy. 2. Defer decision of continued medical management versus revascularization at the time of mitral valve surgery to Drs. Roxy Manns and [employer]. 3. Restart rivaroxaban tomorrow if no evidence of bleeding or other vascular complication at site of catheterization.   Above copied from Cardiac Cath report 03/27/17.  I spoke with patient.  Pt is asking about Dr Harrington Challenger' recommendations for follow-up of need for mitral valve surgery. Pt states if she is anxious to have surgery done for her mitral valve.  Pt advised I will forward to Dr Harrington Challenger for review.

## 2017-04-01 NOTE — Telephone Encounter (Signed)
Need to set up appt to see C Roxy Manns

## 2017-04-03 ENCOUNTER — Encounter: Payer: Self-pay | Admitting: Family Medicine

## 2017-04-03 ENCOUNTER — Ambulatory Visit (INDEPENDENT_AMBULATORY_CARE_PROVIDER_SITE_OTHER): Payer: Medicare Other | Admitting: Family Medicine

## 2017-04-03 VITALS — BP 110/74 | HR 72 | Temp 98.3°F | Wt 130.6 lb

## 2017-04-03 DIAGNOSIS — E119 Type 2 diabetes mellitus without complications: Secondary | ICD-10-CM

## 2017-04-03 DIAGNOSIS — I2510 Atherosclerotic heart disease of native coronary artery without angina pectoris: Secondary | ICD-10-CM | POA: Diagnosis not present

## 2017-04-03 DIAGNOSIS — H6123 Impacted cerumen, bilateral: Secondary | ICD-10-CM

## 2017-04-03 NOTE — Progress Notes (Signed)
Subjective:     Patient ID: [name], female   DOB: 05/16/1934, 83 y.o.   MRN: [id number]  HPI Patient seen for medical follow-up. She's had some progressive dyspnea had recent catheterization which showed single-vessel coronary disease with 99% distal RCA stenosis. Nonobstructive disease involving LAD and left circumflex. She also has mitral insufficiency and has pending follow-up with cardiothoracic surgery. She has history of atrial fibrillation and is back on her blood thinner now. She still able to walk about a mile per day on her treadmill. No recent chest pains.  Type 2 diabetes. Was off metformin for about 5 days. Blood sugars increased during that time. Recent A1c in May 6.8%.  Other medical problems include history of hypertension and GERD.  She has some chronic insomnia and is on Klonopin  She has little bit of fullness in both ears. History of cerumen impactions in the past.  Past Medical History:  Diagnosis Date  . Allergy   . Anxiety   . Arthritis   . Atrial enlargement, left   . Depression   . Diabetes mellitus without complication (HCC)    fasting cbg 110s  . GERD (gastroesophageal reflux disease)   . H/O hiatal hernia   . Headache(784.0)   . Heart murmur    06/10/2014 seeing new cardiologist  . Heart murmur   . HOH (hard of hearing)    HOH in left ear; needs to speak to patient in right ear  . Hyperlipidemia   . Hypertension   . Mitral regurgitation   . Mitral valve prolapse   . Palpitations   . PONV (postoperative nausea and vomiting)    Patient stated "I like the patch behind my ear"  . 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 UTI (lower urinary tract infection)    Past Surgical History:  Procedure Laterality Date  . ABDOMINAL HYSTERECTOMY    . Anterior posterior and enterocele repairs  09/21/2004   With uterosacral cardinal colposuspension, partial colpocleisis; Selinda Orion, MD  . BACK SURGERY    . BREAST ENHANCEMENT SURGERY  02/25/2002   Bilateral reduction and  excision of accessory breast tissue underneath the left breast; Aretha Parrot., MD  . BREAST SURGERY  11   reduction  . CARDIAC CATHETERIZATION     no PCI  . CATARACT EXTRACTION Bilateral   . COLONOSCOPY    . EYE SURGERY Bilateral    cataract removal  . LUMBAR LAMINECTOMY/ DECOMPRESSION WITH MET-RX N/A 03/16/2014   Procedure: LUMBAR FIVE-SACRAL ONE EXTRAFORAMINAL DISKECTOMY WITH METREX;  Surgeon: Kristeen Miss, MD;  Location: [facility];  Service: Neurosurgery;  Laterality: N/A;  . LUMBAR LAMINECTOMY/DECOMPRESSION MICRODISCECTOMY Left 06/08/2014   Procedure: Left Lumbar Five-Sacral One Microdiskectomy;  Surgeon: Kristeen Miss, MD;  Location: [facility];  Service: Neurosurgery;  Laterality: Left;  Left L5-S1 Microdiskectomy  . PELVIC FLOOR REPAIR    . POSTERIOR LUMBAR FUSION 4 LEVEL N/A 08/08/2015   Procedure: T8-L1 posterior lateral fusion with decompression T12-L1;  Surgeon: Kristeen Miss, MD;  Location: [facility];  Service: Neurosurgery;  Laterality: N/A;  T8-L1 posterior lateral fusion with decompression T12-L1  . RIGHT/LEFT HEART CATH AND CORONARY ANGIOGRAPHY N/A 03/28/2017   Procedure: Right/Left Heart Cath and Coronary Angiography;  Surgeon: Nelva Bush, MD;  Location: [facility];  Service: Cardiovascular;  Laterality: N/A;  . ROTATOR CUFF REPAIR Bilateral    11.12  . TEE WITHOUT CARDIOVERSION N/A 03/04/2017   Procedure: TRANSESOPHAGEAL ECHOCARDIOGRAM (TEE);  Surgeon: Fay Records, MD;  Location: [facility];  Service: Cardiovascular;  Laterality: N/A;  . TONSILLECTOMY AND ADENOIDECTOMY      reports that she has never smoked. She has never used smokeless tobacco. She reports that she drinks alcohol. She reports that she uses drugs, including Marijuana. family history includes Arthritis in her father and mother; Heart disease in her father; Heart failure in her father; Hyperlipidemia in her father and mother; Hypertension in her mother. Allergies  Allergen  Reactions  . Chlorhexidine Gluconate [Chlorhexidine] Itching    Pt is unfamiliar with this allergy  . Codeine Swelling    Facial swelling  . Cortisone     Insomnia, heart palpitations (po only)  . Doxycycline Hyclate Nausea Only    Pt states she CAN tolerate  . Statins Other (See Comments)    Myalgias   . Sulfonamide Derivatives     As a child     Review of Systems  Constitutional: Positive for fatigue. Negative for chills and fever.  Eyes: Negative for visual disturbance.  Respiratory: Positive for shortness of breath. Negative for cough, chest tightness and wheezing.   Cardiovascular: Negative for chest pain, palpitations and leg swelling.  Endocrine: Negative for polydipsia and polyuria.  Neurological: Negative for dizziness, seizures, syncope, weakness, light-headedness and headaches.       Objective:   Physical Exam  Constitutional: She appears well-developed and well-nourished.  HENT:  She has cerumen both ear canals and removed with curette without difficulty  Eyes: Pupils are equal, round, and reactive to light.  Neck: Neck supple. No JVD present. No thyromegaly present.  Cardiovascular: Normal rate and regular rhythm.  Exam reveals no gallop.   Murmur heard. Pulmonary/Chest: Effort normal and breath sounds normal. No respiratory distress. She has no wheezes. She has no rales.  Musculoskeletal: She exhibits no edema.  Neurological: She is alert.       Assessment:     #1 coronary artery disease with recent cath revealing single-vessel disease RCA  #2 mitral insufficiency with progressive dyspnea  #3 type 2 diabetes with history of good control  #4 bilateral cerumen impactions removed with curette    Plan:     -We elected not to do any labs today with A1c since this was done late May. -Continue current medications -Continue close follow-up with cardiology -Routine follow-up in 2 months and repeat lab work then  Eulas Post MD [facility]

## 2017-04-05 ENCOUNTER — Encounter: Payer: Self-pay | Admitting: Thoracic Surgery (Cardiothoracic Vascular Surgery)

## 2017-04-05 ENCOUNTER — Institutional Professional Consult (permissible substitution) (INDEPENDENT_AMBULATORY_CARE_PROVIDER_SITE_OTHER): Payer: Medicare Other | Admitting: Thoracic Surgery (Cardiothoracic Vascular Surgery)

## 2017-04-05 VITALS — BP 135/77 | HR 90 | Resp 18 | Ht 66.5 in | Wt 130.0 lb

## 2017-04-05 DIAGNOSIS — I25118 Atherosclerotic heart disease of native coronary artery with other forms of angina pectoris: Secondary | ICD-10-CM | POA: Diagnosis not present

## 2017-04-05 DIAGNOSIS — I080 Rheumatic disorders of both mitral and aortic valves: Secondary | ICD-10-CM

## 2017-04-05 DIAGNOSIS — I340 Nonrheumatic mitral (valve) insufficiency: Secondary | ICD-10-CM | POA: Diagnosis not present

## 2017-04-05 DIAGNOSIS — I059 Rheumatic mitral valve disease, unspecified: Secondary | ICD-10-CM

## 2017-04-05 MED ORDER — AMIODARONE HCL 200 MG PO TABS: 200.0000 mg | ORAL_TABLET | Freq: Two times a day (BID) | ORAL | 0 refills | Status: DC

## 2017-04-05 NOTE — Progress Notes (Signed)
[address]       [place] [address]             [phone]     CARDIOTHORACIC SURGERY CONSULTATION REPORT  Referring Provider is Fay Records, MD PCP is Eulas Post, MD  Chief Complaint  Patient presents with  . Mitral Stenosis    Surgical eval, Cardiac Cath 03/28/17, ECHO TEE 03/04/17    HPI:  Patient is an 83 year old female with history of mitral valve prolapse, hypertension, type 2 diabetes mellitus without complication, scoliosis of the back status post lumbar fusion, and recently discovered coronary artery disease who has been referred for surgical consultation to discuss management of severe symptomatic primary mitral regurgitation and coronary artery disease. The patient states that she has been told that she had a heart murmur for most of her life and she was diagnosed with mitral valve prolapse many years ago at the [facility] clinic. She was told that eventually she might need to have surgery. She has been physically active and functionally independent and overall done quite well from a cardiac standpoint until recently.  She has been followed for several years by Dr. Harrington Challenger and previous echocardiograms have documented the presence of moderate mitral regurgitation with normal left ventricular systolic function. Over the past 2 or 3 months the patient has developed symptoms of exertional shortness of breath and substernal chest pressure. She has noticed significant dyspnea with activity and occasional swelling of both ankles. She has not had resting shortness of breath, PND, orthopnea, or lower extremity edema. She has had substernal chest pressure that typically associated with exertion but also can occur after meals. Symptoms usually relieved by rest. She has had increasing palpitations without dizzy spells or syncope.  Follow-up transthoracic echocardiogram performed 02/12/2017 revealed normal left ventricular systolic function with mitral valve  prolapse and at least moderate or severe mitral regurgitation. The patient subsequently underwent transesophageal echocardiogram on 03/04/2017. This confirmed the presence of normal left ventricular systolic function with bileaflet prolapse of the mitral valve. There were multiple regurgitant jets with one particularly severe jet of regurgitation coursing posteriorly around the left atrium. There was severe left atrial enlargement.  Left and right heart catheterization was performed 03/28/2017. This revealed multivessel coronary artery disease with 100% chronic occlusion of the mid right coronary artery and left to right collateral filling of the terminal branches of the right coronary artery.  There was diffuse nonobstructive disease in the left coronary circulation. Pulmonary artery pressures were normal. The patient was referred for surgical consultation.  The patient is married and lives in [place] with her husband. She has been retired for approximately 15 years having previously worked at [employer] for [employer] at [employer]. She has a total of 3 children one of whom is living, 5 grandchildren and 1 great grandchild. She has remained reasonably active physically all of her adult life. She was limited to some degree by severe scoliosis but she underwent a combination of 2 lumbar fusion procedures by Dr. Ellene Route and has been doing well since then. She complains of a 2-3 month history of exertional shortness of breath and chest pressure as noted previously. She has not had any prolonged episodes of substernal chest discomfort or shortness of breath. Appetite is good. Weight is stable.  She was recently started on Xarelto by Dr. Harrington Challenger. There is no documented history of atrial fibrillation or atrial flutter.  Past Medical History:  Diagnosis Date  .  Allergy   . Anxiety   . Arthritis   . Atrial enlargement, left   . Depression   . Diabetes mellitus without complication (HCC)    fasting cbg  110s  . GERD (gastroesophageal reflux disease)   . H/O hiatal hernia   . Headache(784.0)   . Heart murmur    06/10/2014 seeing new cardiologist  . Heart murmur   . HOH (hard of hearing)    HOH in left ear; needs to speak to patient in right ear  . Hyperlipidemia   . Hypertension   . Mitral regurgitation   . Mitral valve prolapse   . Palpitations   . PONV (postoperative nausea and vomiting)    Patient stated "I like the patch behind my ear"  . 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 UTI (lower urinary tract infection)     Past Surgical History:  Procedure Laterality Date  . ABDOMINAL HYSTERECTOMY    . Anterior posterior and enterocele repairs  09/21/2004   With uterosacral cardinal colposuspension, partial colpocleisis; Selinda Orion, MD  . BACK SURGERY    . BREAST ENHANCEMENT SURGERY  02/25/2002   Bilateral reduction and excision of accessory breast tissue underneath the left breast; Aretha Parrot., MD  . BREAST SURGERY  11   reduction  . CARDIAC CATHETERIZATION     no PCI  . CATARACT EXTRACTION Bilateral   . COLONOSCOPY    . EYE SURGERY Bilateral    cataract removal  . LUMBAR LAMINECTOMY/ DECOMPRESSION WITH MET-RX N/A 03/16/2014   Procedure: LUMBAR FIVE-SACRAL ONE EXTRAFORAMINAL DISKECTOMY WITH METREX;  Surgeon: Kristeen Miss, MD;  Location: [facility];  Service: Neurosurgery;  Laterality: N/A;  . LUMBAR LAMINECTOMY/DECOMPRESSION MICRODISCECTOMY Left 06/08/2014   Procedure: Left Lumbar Five-Sacral One Microdiskectomy;  Surgeon: Kristeen Miss, MD;  Location: [place] [facility];  Service: Neurosurgery;  Laterality: Left;  Left L5-S1 Microdiskectomy  . PELVIC FLOOR REPAIR    . POSTERIOR LUMBAR FUSION 4 LEVEL N/A 08/08/2015   Procedure: T8-L1 posterior lateral fusion with decompression T12-L1;  Surgeon: Kristeen Miss, MD;  Location: [place];  Service: Neurosurgery;  Laterality: N/A;  T8-L1 posterior lateral fusion with decompression T12-L1  . RIGHT/LEFT HEART CATH AND CORONARY ANGIOGRAPHY  N/A 03/28/2017   Procedure: Right/Left Heart Cath and Coronary Angiography;  Surgeon: Nelva Bush, MD;  Location: [facility];  Service: Cardiovascular;  Laterality: N/A;  . ROTATOR CUFF REPAIR Bilateral    11.12  . TEE WITHOUT CARDIOVERSION N/A 03/04/2017   Procedure: TRANSESOPHAGEAL ECHOCARDIOGRAM (TEE);  Surgeon: Fay Records, MD;  Location: [facility];  Service: Cardiovascular;  Laterality: N/A;  . TONSILLECTOMY AND ADENOIDECTOMY      Family History  Problem Relation Age of Onset  . Heart failure Father   . Arthritis Father   . Hyperlipidemia Father   . Heart disease Father   . Arthritis Mother   . Hyperlipidemia Mother   . Hypertension Mother     Social History   Social History  . Marital status: Married    Spouse name: N/A  . Number of children: N/A  . Years of education: N/A   Occupational History  . Retired Retired   Social History Main Topics  . Smoking status: Never Smoker  . Smokeless tobacco: Never Used  . Alcohol use Yes     Comment: newly discovered diabetes, she has stopped her alcohol intake; rare alcohol intake  . Drug use: Yes    Types: Marijuana  . Sexual activity: Not on file  Other Topics Concern  . Not on file   Social History Narrative   Married   Regular exercise    Current Outpatient Prescriptions  Medication Sig Dispense Refill  . bisacodyl (DULCOLAX) 5 MG EC tablet Take 10 mg by mouth daily as needed (for constipation.).    [name] citalopram (CELEXA) 20 MG tablet Take 1 tablet (20 mg total) by mouth daily. 90 tablet 3  . clonazePAM (KLONOPIN) 1 MG tablet Take one po qhs prn insomnia and severe anxiety. May refill on or after Jan 09 2016. (Patient taking differently: Take 1 mg by mouth at bedtime. May refill on or after Jan 09 2016.) 30 tablet 3  . conjugated estrogens (PREMARIN) vaginal cream Place 1 Applicatorful vaginally daily. (Patient taking differently: Place 1 Applicatorful vaginally once a week. ) 42.5 g 12  . diltiazem  (DILACOR XR) 240 MG 24 hr capsule Take 1 capsule (240 mg total) by mouth daily. 90 capsule 3  . fluticasone (FLONASE) 50 MCG/ACT nasal spray SHAKE LIQUID AND USE 2 SPRAYS IN EACH NOSTRIL DAILY AS NEEDED FOR ALLERGIES 16 g 2  . ibuprofen (ADVIL,MOTRIN) 200 MG tablet Take 400 mg by mouth every 8 (eight) hours as needed (for pain.).    [name] lisinopril-hydrochlorothiazide (PRINZIDE,ZESTORETIC) 10-12.5 MG tablet Take 1 tablet by mouth daily. 90 tablet 3  . metFORMIN (GLUCOPHAGE) 500 MG tablet Take 2 tablets (1,000 mg total) by mouth 2 (two) times daily with a meal. 360 tablet 3  . Multiple Vitamins-Minerals (ICAPS AREDS 2) CAPS Take 1 capsule by mouth 2 (two) times daily.    Glory Rosebush DELICA LANCETS 80X MISC USE TO TEST TWICE DAILY AS DIRECTED 100 each 0  . ONETOUCH VERIO test strip USE TO CHECK BLOOD GLUCOSE TWICE DAILY AS DIRECTED 100 each 3  . OVER THE COUNTER MEDICATION Take 1 tablet by mouth daily as needed (for allergies.). Unknown otc allergy relief tablet    . Probiotic Product (PROBIOTIC DAILY PO) Take 1-2 capsules by mouth 2 (two) times daily. Take 2 capsules in the morning & 1 capsule at night    . rivaroxaban (XARELTO) 20 MG TABS tablet Take 1 tablet (20 mg total) by mouth daily with supper. Restart day after catheterization if no bleeding or swelling at catheterization site. 30 tablet 11  . trimethoprim (TRIMPEX) 100 MG tablet TAKE 1 TABLET(100 MG) BY MOUTH DAILY 90 tablet 1   No current facility-administered medications for this visit.     Allergies  Allergen Reactions  . Chlorhexidine Gluconate [Chlorhexidine] Itching    Pt is unfamiliar with this allergy  . Codeine Swelling    Facial swelling  . Cortisone     Insomnia, heart palpitations (po only)  . Doxycycline Hyclate Nausea Only    Pt states she CAN tolerate  . Statins Other (See Comments)    Myalgias   . Sulfonamide Derivatives     As a child      Review of Systems:   General:  normal appetite, normal energy, no  weight gain, no weight loss, no fever  Cardiac:  + chest pain with exertion, no chest pain at rest, + SOB with exertion, no resting SOB, no PND, no orthopnea, + palpitations, no arrhythmia, no atrial fibrillation, + LE edema, no dizzy spells, no syncope  Respiratory:  + exertional shortness of breath, no home oxygen, no productive cough, no dry cough, no bronchitis, no wheezing, no hemoptysis, no asthma, no pain with inspiration or cough, no sleep apnea, no CPAP at night  GI:   no difficulty swallowing, no reflux, no frequent heartburn, + hiatal hernia, no abdominal pain, no constipation, no diarrhea, no hematochezia, no hematemesis, no melena  GU:   no dysuria,  no frequency, no urinary tract infection, no hematuria, no kidney stones, no kidney disease  Vascular:  no pain suggestive of claudication, no pain in feet, no leg cramps, + varicose veins, no DVT, no non-healing foot ulcer  Neuro:   no stroke, no TIA's, no seizures, no headaches, no temporary blindness one eye,  no slurred speech, no peripheral neuropathy, mild chronic pain, very mild instability of gait, no memory/cognitive dysfunction  Musculoskeletal: + arthritis, no joint swelling, no myalgias, no difficulty walking, normal mobility   Skin:   no rash, no itching, no skin infections, no pressure sores or ulcerations  Psych:   + anxiety, no depression, no nervousness, no unusual recent stress  Eyes:   no blurry vision, no floaters, no recent vision changes, + wears glasses or contacts  ENT:   + hearing loss, no loose or painful teeth, no dentures, last saw dentist 03/2017  Hematologic:  no easy bruising, no abnormal bleeding, no clotting disorder, no frequent epistaxis  Endocrine:  + diabetes, does check CBG's at home     Physical Exam:   BP 135/77   Pulse 90   Resp 18   Ht 5' 6.5" (1.689 m)   Wt 130 lb (59 kg)   SpO2 97% Comment: RA  BMI 20.67 kg/m   General:  Thin, elderly and somewhat frail-appearing  HEENT:  Unremarkable    Neck:   no JVD, no bruits, no adenopathy   Chest:   clear to auscultation, symmetrical breath sounds, no wheezes, no rhonchi   CV:   RRR, grade IV/VI systolic murmur best at apex  Abdomen:  soft, non-tender, no masses   Extremities:  warm, well-perfused, pulses diminished, + LE edema  Rectal/GU  Deferred  Neuro:   Grossly non-focal and symmetrical throughout  Skin:   Clean and dry, no rashes, no breakdown   Diagnostic Tests:  Transthoracic Echocardiography  Patient:    Sherea, Liptak MR #:       [id number] Study Date: 02/12/2017 Gender:     F Age:        74 Height:     168.9 cm Weight:     60.6 kg BSA:        1.69 m^2 Pt. Status: Room:   Daleen Squibb, M.D.  REFERRING    Dorris Carnes, M.D.  REFERRING    Burchette, Alinda Sierras  ATTENDING    Sanda Klein, MD  SONOGRAPHER  Marygrace Drought, RCS  PERFORMING   [facility] Out[facility]  cc:  ------------------------------------------------------------------- LV EF: 55% -   60%  ------------------------------------------------------------------- Indications:      Mitral valve disorder (I05.9).  ------------------------------------------------------------------- History:   PMH:  Hyperlipidemia.  Risk factors:  Hypertension. Diabetes mellitus.  ------------------------------------------------------------------- Study Conclusions  - Left ventricle: Systolic function was normal. The estimated   ejection fraction was in the range of 55% to 60%. Wall motion was   normal; there were no regional wall motion abnormalities. - Mitral valve: Moderate, late systolicprolapse, involving the   anterior leaflet and the posterior leaflet. There was at least   moderate regurgitation. Several eccentric jets are seen. - Left atrium: The atrium was severely dilated.  Impressions:  - Consider TEE, especially if the patient is a surgical candidate   for MV repair. MR severity  may be  underestimated.  ------------------------------------------------------------------- Study data:  Comparison was made to the study of 12/01/2015.  Study status:  Routine.  Procedure:  The patient reported no pain pre or post test. Transthoracic echocardiography. Image quality was adequate.          Transthoracic echocardiography.  M-mode, complete 2D, spectral Doppler, and color Doppler.  Birthdate: Patient birthdate: 16-Aug-1934.  Age:  Patient is 83 yr old.  Sex: Gender: female.    BMI: 21.3 kg/m^2.  Blood pressure:     122/64 Patient status:  Outpatient.  Study date:  Study date: 02/12/2017. Study time: 09:21 AM.  Location:  [facility]  -------------------------------------------------------------------  ------------------------------------------------------------------- Left ventricle:  Systolic function was normal. The estimated ejection fraction was in the range of 55% to 60%. Wall motion was normal; there were no regional wall motion abnormalities.  ------------------------------------------------------------------- Aortic valve:   Structurally normal valve. Trileaflet. Cusp separation was normal.  Doppler:  Transvalvular velocity was within the normal range. There was no stenosis. There was no regurgitation.  ------------------------------------------------------------------- Aorta:  Aortic root: The aortic root was normal in size. Ascending aorta: The ascending aorta was normal in size.  ------------------------------------------------------------------- Mitral valve:   Moderately thickened leaflets . Moderate myxomatous degeneration.  Moderate, late systolicprolapse, involving the anterior leaflet and the posterior leaflet.  Doppler:  There was at least moderate regurgitation. Several eccentric jets are seen. Valve area by pressure half-time: 2.47 cm^2. Indexed valve area by pressure half-time: 1.47 cm^2/m^2. Valve area by continuity equation (using LVOT  flow): 2.35 cm^2. Indexed valve area by continuity equation (using LVOT flow): 1.39 cm^2/m^2.    Mean gradient (D): 3 mm Hg. Peak gradient (D): 5 mm Hg.  ------------------------------------------------------------------- Left atrium:  The atrium was severely dilated.  ------------------------------------------------------------------- Right ventricle:  The cavity size was normal.  ------------------------------------------------------------------- Pulmonic valve:   Poorly visualized.  Doppler:  There was no significant regurgitation.  ------------------------------------------------------------------- Tricuspid valve:   Structurally normal valve.   Leaflet separation was normal.  Doppler:  Transvalvular velocity was within the normal range. There was mild regurgitation.  ------------------------------------------------------------------- Pulmonary artery:   Systolic pressure was within the normal range.   ------------------------------------------------------------------- Right atrium:  The atrium was normal in size.  ------------------------------------------------------------------- Systemic veins: Inferior vena cava: The vessel was normal in size. The respirophasic diameter changes were in the normal range (= 50%), consistent with normal central venous pressure. Diameter: 13 mm.  ------------------------------------------------------------------- Measurements   IVC                                       Value          Reference  ID                                        13    mm       ---------    Left ventricle                            Value          Reference  LV ID, ED, PLAX chordal           (L)     41.79 mm       43 - 52  LV  ID, ES, PLAX chordal                   26.73 mm       23 - 38  LV fx shortening, PLAX chordal            36    %        >=29  LV PW thickness, ED                       12.7  mm       ---------  IVS/LV PW ratio, ED                        0.8            <=1.3  Stroke volume, 2D                         82    ml       ---------  Stroke volume/bsa, 2D                     49    ml/m^2   ---------  LV e&', lateral                            9.25  cm/s     ---------  LV E/e&', lateral                          12.65          ---------  LV e&', medial                             5.55  cm/s     ---------  LV E/e&', medial                           21.08          ---------  LV e&', average                            7.4   cm/s     ---------  LV E/e&', average                          15.81          ---------    Ventricular septum                        Value          Reference  IVS thickness, ED                         10.2  mm       ---------    LVOT                                      Value          Reference  LVOT ID, S  20    mm       ---------  LVOT area                                 3.14  cm^2     ---------  LVOT peak velocity, S                     79.1  cm/s     ---------  LVOT mean velocity, S                     54.4  cm/s     ---------  LVOT VTI, S                               26    cm       ---------    Aorta                                     Value          Reference  Aortic root ID, ED                        29    mm       ---------    Left atrium                               Value          Reference  LA ID, A-P, ES                            55    mm       ---------  LA ID/bsa, A-P                    (H)     3.26  cm/m^2   <=2.2  LA volume, S                              92.1  ml       ---------  LA volume/bsa, S                          54.6  ml/m^2   ---------  LA volume, ES, 1-p A4C                    85.2  ml       ---------  LA volume/bsa, ES, 1-p A4C                50.5  ml/m^2   ---------  LA volume, ES, 1-p A2C                    83    ml       ---------  LA volume/bsa, ES, 1-p A2C                49.2  ml/m^2   ---------    Mitral valve  Value          Reference  Mitral E-wave peak velocity               117   cm/s     ---------  Mitral A-wave peak velocity               123   cm/s     ---------  Mitral mean velocity, D                   76.8  cm/s     ---------  Mitral deceleration time                  173   ms       150 - 230  Mitral pressure half-time                 69    ms       ---------  Mitral mean gradient, D                   3     mm Hg    ---------  Mitral peak gradient, D                   5     mm Hg    ---------  Mitral E/A ratio, peak                    1              ---------  Mitral valve area, PHT, DP                2.47  cm^2     ---------  Mitral valve area/bsa, PHT, DP            1.47  cm^2/m^2 ---------  Mitral valve area, LVOT                   2.35  cm^2     ---------  continuity  Mitral valve area/bsa, LVOT               1.39  cm^2/m^2 ---------  continuity  Mitral annulus VTI, D                     34.8  cm       ---------  Mitral regurg VTI, PISA                   205   cm       ---------  Mitral ERO, PISA                          0.16  cm^2     ---------  Mitral regurg volume, PISA                33    ml       ---------    Pulmonary arteries                        Value          Reference  PA pressure, S, DP                        22    mm Hg    <=30    Tricuspid valve  Value          Reference  Tricuspid regurg peak velocity            217   cm/s     ---------  Tricuspid peak RV-RA gradient             19    mm Hg    ---------  Tricuspid maximal regurg                  217   cm/s     ---------  velocity, PISA    Systemic veins                            Value          Reference  Estimated CVP                             3     mm Hg    ---------    Right ventricle                           Value          Reference  TAPSE                                     20.3  mm       ---------  RV pressure, S, DP                        22    mm Hg    <=30  RV s&',  lateral, S                         17    cm/s     ---------  Legend: (L)  and  (H)  mark values outside specified reference range.  ------------------------------------------------------------------- Prepared and Electronically Authenticated by  Sanda Klein, MD 2018-06-05T11:03:21   Transesophageal Echocardiography  Patient:    Lori, Mccarthy MR #:       [id number] Study Date: 03/04/2017 Gender:     F Age:        47 Height: Weight: BSA: Pt. Status: Room:   ADMITTING    Dorris Carnes, M.D.  ATTENDING    Dorris Carnes, M.D.  ORDERING     Dorris Carnes, M.D.  PERFORMING   Dorris Carnes, M.D.  REFERRING    Dorris Carnes, M.D.  SONOGRAPHER  Dustin Flock, RCS  cc:  -------------------------------------------------------------------  ------------------------------------------------------------------- Indications:      Mitral regurgitation 424.0.  ------------------------------------------------------------------- History:   PMH:   Atrial fibrillation.  Mitral valve disease.   ------------------------------------------------------------------- Study data:   Study status:  Routine.  Consent:  The risks, benefits, and alternatives to the procedure were explained to the patient and informed consent was obtained.  Procedure:  The patient reported no pain pre or post test. Initial setup. The patient was brought to the laboratory. Surface ECG leads were monitored. Sedation. Conscious sedation was administered by cardiology staff. Transesophageal echocardiography. Topical anesthesia was obtained using viscous lidocaine. A transesophageal probe was inserted by the attending cardiologistwithout difficulty. Image quality was adequate.  Study completion:  The patient tolerated the procedure well. There were no complications.  Administered medications: Fentanyl, 25mg, IV.  Midazolam, 875m IV.  Diphenhydramine, 2563mIV.          Diagnostic transesophageal echocardiography.  2D  and color Doppler.  Birthdate:  Patient birthdate: 03/11-Jul-1935Age: Patient is [age] old.  Sex:  Gender: female.  Blood pressure: 108/73  Patient status:  Outpatient.  Study date:  Study date: 03/04/2017. Study time: 09:17 AM.  Location:  Endoscopy.  -------------------------------------------------------------------  ------------------------------------------------------------------- Left ventricle:  LVEF is normal. LVEF is normal.  ------------------------------------------------------------------- Aortic valve:  AV is mildly thickened. AV is mildly thickened. No AI.  ------------------------------------------------------------------- Aorta:  Mild fixed plaquing of the thoracic aorta MIld fixed plaquing of the thoracic aorta.  ------------------------------------------------------------------- Mitral valve:  Mild bileaflet prolapse of the MV There are a couple regurgitant jets The predominant one is directed posterior to back of LA It does not reflux into pulmonary vein but does touch back wall (3-4/4)  ------------------------------------------------------------------- Left atrium:  Left atrium is large LA appendage is without masses. Left atirum is severely dilated  ------------------------------------------------------------------- Right ventricle:  RVEF is normal. RVEF is normal.  ------------------------------------------------------------------- Pulmonic valve:   PV is normal. PV is normal.  ------------------------------------------------------------------- Tricuspid valve:  TV is normal Mild TR TV is normal MIld TR   ------------------------------------------------------------------- Post procedure conclusions Ascending Aorta:  - Mild fixed plaquing of the thoracic aorta MIld fixed plaquing of   the thoracic aorta.  ------------------------------------------------------------------- Prepared and Electronically Authenticated by  PauDorris CarnesM.D. 2018-06-29T00:26:38   Impression:  Patient has mitral valve prolapse with stage D severe symptomatic primary mitral regurgitation and multivessel coronary artery disease.  She presents with a several month history of symptoms of exertional shortness of breath, palpitations, and lower extremity edema consistent with chronic diastolic congestive heart failure, New York Heart Association functional class II. She also has been having exertional chest pressure consistent with stable angina pectoris.  I have personally reviewed the patient's recent transthoracic and transesophageal echocardiograms and diagnostic cardiac catheterization. Echocardiograms revealed the presence of bileaflet prolapse and mitral valve. Both leaflets are somewhat thickened and there is severe prolapse involving the posterior leaflet. However, there are no obvious ruptured chordae tendineae nor are there any flail segments. There are multiple jets of regurgitation. The leaflets of the valve are somewhat thickened. There does not appear to be significant calcification. The severity of mitral regurgitation actually looked a bit worse on transthoracic echo than it did on transesophageal echo. Left ventricular systolic function appears normal. Diagnostic cardiac catheterization reveals multivessel coronary artery disease with 100% occlusion of the mid right coronary artery, left to right collateral filling of the terminal branches of the right coronary artery, and nonobstructive disease in the left circumflex system.  Options at this time include continued medical therapy versus surgical intervention. Although the patient is somewhat elderly, the risks associated with surgery should be relatively low.    Plan:  The patient and her husband were counseled at length regarding the indications, risks and potential benefits of mitral valve repair and coronary artery bypass grafting.  The rationale for elective surgery has been explained,  including a comparison between surgery and continued medical therapy with close follow-up.  The likelihood of successful and durable valve repair has been discussed with particular reference to the findings of their recent echocardiogram.  Based upon these findings and previous experience, I have quoted them a greater than 50 percent likelihood of successful valve repair.  In the unlikely event that their valve cannot be successfully repaired, we  discussed the possibility of replacing the mitral valve using a mechanical prosthesis with the attendant need for long-term anticoagulation versus the alternative of replacing it using a bioprosthetic tissue valve with its potential for late structural valve deterioration and failure, depending upon the patient's longevity.  The patient specifically requests that if the mitral valve must be replaced that it be done using a bioprosthetic tissue valve.   The patient understands and accepts all potential risks of surgery including but not limited to risk of death, stroke or other neurologic complication, myocardial infarction, congestive heart failure, respiratory failure, renal failure, bleeding requiring transfusion and/or reexploration, arrhythmia, infection or other wound complications, pneumonia, pleural and/or pericardial effusion, pulmonary embolus, aortic dissection or other major vascular complication, or delayed complications related to valve repair or replacement including but not limited to structural valve deterioration and failure, thrombosis, embolization, endocarditis, paravalvular leak, or late recurrence of symptomatic coronary artery disease.  All of their questions have been answered.  We plan to proceed with surgery on Tuesday, 04/23/2017. The patient has been instructed to stop taking Xarelto 7 days prior to surgery. She has been given a prescription for amiodarone to begin 1 week prior to surgery. She will undergo CT angiography to further characterize  the feasibility of peripheral arterial cannulation for surgery.   I spent in excess of 90 minutes during the conduct of this office consultation and >50% of this time involved direct face-to-face encounter with the patient for counseling and/or coordination of their care.    Valentina Gu. Roxy Manns, MD 04/05/2017 3:51 PM

## 2017-04-05 NOTE — Patient Instructions (Signed)
Stop taking Xarelto and begin taking amiodarone 7 days prior to surgery (August 7th)  Continue taking all other medications without change through the day before surgery.  Have nothing to eat or drink after midnight the night before surgery.  On the morning of surgery take only Dilacor (diltiazem) with a sip of water.

## 2017-04-08 ENCOUNTER — Other Ambulatory Visit: Payer: Self-pay

## 2017-04-08 ENCOUNTER — Other Ambulatory Visit: Payer: Self-pay | Admitting: *Deleted

## 2017-04-08 DIAGNOSIS — Z01818 Encounter for other preprocedural examination: Secondary | ICD-10-CM

## 2017-04-08 DIAGNOSIS — I2510 Atherosclerotic heart disease of native coronary artery without angina pectoris: Secondary | ICD-10-CM

## 2017-04-08 DIAGNOSIS — I7101 Dissection of thoracic aorta: Secondary | ICD-10-CM

## 2017-04-08 DIAGNOSIS — I340 Nonrheumatic mitral (valve) insufficiency: Secondary | ICD-10-CM

## 2017-04-08 DIAGNOSIS — I7409 Other arterial embolism and thrombosis of abdominal aorta: Secondary | ICD-10-CM

## 2017-04-08 DIAGNOSIS — I71019 Dissection of thoracic aorta, unspecified: Secondary | ICD-10-CM

## 2017-04-09 ENCOUNTER — Other Ambulatory Visit: Payer: Self-pay | Admitting: *Deleted

## 2017-04-15 ENCOUNTER — Ambulatory Visit
Admission: RE | Admit: 2017-04-15 | Discharge: 2017-04-15 | Disposition: A | Discharge status: Home / Self Care | Payer: Medicare Other | Source: Ambulatory Visit | Attending: Thoracic Surgery (Cardiothoracic Vascular Surgery) | Admitting: Thoracic Surgery (Cardiothoracic Vascular Surgery)

## 2017-04-15 DIAGNOSIS — I7101 Dissection of thoracic aorta: Secondary | ICD-10-CM

## 2017-04-15 DIAGNOSIS — I2510 Atherosclerotic heart disease of native coronary artery without angina pectoris: Secondary | ICD-10-CM

## 2017-04-15 DIAGNOSIS — Z01818 Encounter for other preprocedural examination: Secondary | ICD-10-CM | POA: Diagnosis not present

## 2017-04-15 DIAGNOSIS — I71019 Dissection of thoracic aorta, unspecified: Secondary | ICD-10-CM

## 2017-04-15 DIAGNOSIS — I340 Nonrheumatic mitral (valve) insufficiency: Secondary | ICD-10-CM

## 2017-04-15 DIAGNOSIS — K5730 Diverticulosis of large intestine without perforation or abscess without bleeding: Secondary | ICD-10-CM | POA: Diagnosis not present

## 2017-04-15 DIAGNOSIS — I7409 Other arterial embolism and thrombosis of abdominal aorta: Secondary | ICD-10-CM

## 2017-04-15 MED ORDER — IOPAMIDOL (ISOVUE-370) INJECTION 76%
75.0000 mL | Freq: Once | INTRAVENOUS | Status: AC | PRN
  Administered 2017-04-15: 75 mL via INTRAVENOUS

## 2017-04-18 NOTE — Pre-Procedure Instructions (Signed)
[name]  04/18/2017      [facility] - [place], [place] - [address] [place] [address] Phone: [phone] Fax: [place], [place] [facility] [address] [place] [address] [place] [address] Phone: [phone] Fax: [phone]  [facility] [place], [address] AT [address] [place] [address] Phone: [phone] Fax: [phone]    Your procedure is scheduled on August 14  Report to [facility] at [place].[facility].  Call this number if you have problems the morning of surgery:  [phone]   Remember:  Do not eat food or drink liquids after midnight.  Continue all other medications as directed by your physician except follow these instructions about you medications   Take these medicines the morning of surgery with A SIP OF WATER citalopram (CELEXA),  clonazePAM (KLONOPIN) , diltiazem (DILACOR XR) , fluticasone (FLONASE), trimethoprim (TRIMPEX),   7 days prior to surgery STOP taking any Aspirin, Aleve, Naproxen, Ibuprofen, Motrin, Advil, Goody's, BC's, all herbal medications, fish oil, and all vitamins  FOLLOW PHYSICIANS INSTRUCTIONS ABOUT XARELTO   WHAT DO I DO ABOUT MY DIABETES MEDICATION?   [name] Do not take oral diabetes medicines (pills) the morning of surgery. metFORMIN (GLUCOPHAGE)   How to Manage Your Diabetes Before and After Surgery  Why is it important to control my blood sugar before and after surgery? . Improving blood sugar levels before and after surgery helps healing and can limit problems. . A way of improving blood sugar control is eating a healthy diet by: o  Eating less sugar and carbohydrates o  Increasing activity/exercise o  Talking with your doctor about reaching your blood sugar goals . High blood sugars (greater than 180  mg/dL) can raise your risk of infections and slow your recovery, so you will need to focus on controlling your diabetes during the weeks before surgery. . Make sure that the doctor who takes care of your diabetes knows about your planned surgery including the date and location.  How do I manage my blood sugar before surgery? . Check your blood sugar at least 4 times a day, starting 2 days before surgery, to make sure that the level is not too high or low. o Check your blood sugar the morning of your surgery when you wake up and every 2 hours until you get to the Short Stay unit. . If your blood sugar is less than 70 mg/dL, you will need to treat for low blood sugar: o Do not take insulin. o Treat a low blood sugar (less than 70 mg/dL) with  cup of clear juice (cranberry or apple), 4 glucose tablets, OR glucose gel. o Recheck blood sugar in 15 minutes after treatment (to make sure it is greater than 70 mg/dL). If your blood sugar is not greater than 70 mg/dL on recheck, call [phone] for further instructions. . Report your blood sugar to the short stay nurse when you get to Short Stay.  . If you are admitted to the hospital after surgery: o Your blood sugar will be checked by the staff and you will probably be given insulin after surgery (instead of oral diabetes medicines) to make sure you have good blood sugar levels. o The goal for blood sugar control after surgery is  80-180 mg/dL.     Do not wear jewelry, make-up or nail polish.  Do not wear lotions, powders, or perfumes, or deoderant.  Do not shave 48 hours prior to surgery.  Men may shave face and neck.  Do not bring valuables to the hospital.  [facility] is not responsible for any belongings or valuables.  Contacts, dentures or bridgework may not be worn into surgery.  Leave your suitcase in the car.  After surgery it may be brought to your room.  For patients admitted to the hospital, discharge time will be determined by your  treatment team.  Patients discharged the day of surgery will not be allowed to drive home.    Special instructions:   [facility]- Preparing For Surgery  Before surgery, you can play an important role. Because skin is not sterile, your skin needs to be as free of germs as possible. You can reduce the number of germs on your skin by washing with CHG (chlorahexidine gluconate) Soap before surgery.  CHG is an antiseptic cleaner which kills germs and bonds with the skin to continue killing germs even after washing.  Please do not use if you have an allergy to CHG or antibacterial soaps. If your skin becomes reddened/irritated stop using the CHG.  Do not shave (including legs and underarms) for at least 48 hours prior to first CHG shower. It is OK to shave your face.  Please follow these instructions carefully.   1. Shower the NIGHT BEFORE SURGERY and the MORNING OF SURGERY with CHG.   2. If you chose to wash your hair, wash your hair first as usual with your normal shampoo.  3. After you shampoo, rinse your hair and body thoroughly to remove the shampoo.  4. Use CHG as you would any other liquid soap. You can apply CHG directly to the skin and wash gently with a scrungie or a clean washcloth.   5. Apply the CHG Soap to your body ONLY FROM THE NECK DOWN.  Do not use on open wounds or open sores. Avoid contact with your eyes, ears, mouth and genitals (private parts). Wash genitals (private parts) with your normal soap.  6. Wash thoroughly, paying special attention to the area where your surgery will be performed.  7. Thoroughly rinse your body with warm water from the neck down.  8. DO NOT shower/wash with your normal soap after using and rinsing off the CHG Soap.  9. Pat yourself dry with a CLEAN TOWEL.   10. Wear CLEAN PAJAMAS   11. Place CLEAN SHEETS on your bed the night of your first shower and DO NOT SLEEP WITH PETS.    Day of Surgery: Do not apply any deodorants/lotions.  Please wear clean clothes to the hospital/surgery center.      Please read over the following fact sheets that you were given.

## 2017-04-19 ENCOUNTER — Ambulatory Visit (HOSPITAL_BASED_OUTPATIENT_CLINIC_OR_DEPARTMENT_OTHER)
Admission: RE | Admit: 2017-04-19 | Discharge: 2017-04-19 | Disposition: A | Discharge status: Home / Self Care | Payer: Medicare Other | Source: Ambulatory Visit | Attending: Thoracic Surgery (Cardiothoracic Vascular Surgery) | Admitting: Thoracic Surgery (Cardiothoracic Vascular Surgery)

## 2017-04-19 ENCOUNTER — Other Ambulatory Visit (HOSPITAL_COMMUNITY): Payer: Self-pay | Admitting: *Deleted

## 2017-04-19 ENCOUNTER — Encounter (HOSPITAL_COMMUNITY)
Admission: RE | Admit: 2017-04-19 | Discharge: 2017-04-19 | Disposition: A | Discharge status: Home / Self Care | Payer: Medicare Other | Source: Ambulatory Visit | Attending: Thoracic Surgery (Cardiothoracic Vascular Surgery) | Admitting: Thoracic Surgery (Cardiothoracic Vascular Surgery)

## 2017-04-19 ENCOUNTER — Ambulatory Visit (HOSPITAL_COMMUNITY)
Admission: RE | Admit: 2017-04-19 | Discharge: 2017-04-19 | Disposition: A | Discharge status: Home / Self Care | Payer: Medicare Other | Source: Ambulatory Visit | Attending: Thoracic Surgery (Cardiothoracic Vascular Surgery) | Admitting: Thoracic Surgery (Cardiothoracic Vascular Surgery)

## 2017-04-19 ENCOUNTER — Encounter (HOSPITAL_COMMUNITY): Payer: Self-pay

## 2017-04-19 DIAGNOSIS — Z01810 Encounter for preprocedural cardiovascular examination: Secondary | ICD-10-CM | POA: Insufficient documentation

## 2017-04-19 DIAGNOSIS — K219 Gastro-esophageal reflux disease without esophagitis: Secondary | ICD-10-CM | POA: Diagnosis not present

## 2017-04-19 DIAGNOSIS — I110 Hypertensive heart disease with heart failure: Secondary | ICD-10-CM | POA: Insufficient documentation

## 2017-04-19 DIAGNOSIS — Z01818 Encounter for other preprocedural examination: Secondary | ICD-10-CM | POA: Diagnosis not present

## 2017-04-19 DIAGNOSIS — I2510 Atherosclerotic heart disease of native coronary artery without angina pectoris: Secondary | ICD-10-CM

## 2017-04-19 DIAGNOSIS — I4891 Unspecified atrial fibrillation: Secondary | ICD-10-CM | POA: Diagnosis not present

## 2017-04-19 DIAGNOSIS — E119 Type 2 diabetes mellitus without complications: Secondary | ICD-10-CM | POA: Insufficient documentation

## 2017-04-19 DIAGNOSIS — F419 Anxiety disorder, unspecified: Secondary | ICD-10-CM | POA: Diagnosis not present

## 2017-04-19 DIAGNOSIS — I340 Nonrheumatic mitral (valve) insufficiency: Secondary | ICD-10-CM

## 2017-04-19 DIAGNOSIS — Z9889 Other specified postprocedural states: Secondary | ICD-10-CM | POA: Diagnosis not present

## 2017-04-19 DIAGNOSIS — E785 Hyperlipidemia, unspecified: Secondary | ICD-10-CM | POA: Diagnosis not present

## 2017-04-19 DIAGNOSIS — R7611 Nonspecific reaction to tuberculin skin test without active tuberculosis: Secondary | ICD-10-CM | POA: Diagnosis not present

## 2017-04-19 DIAGNOSIS — I6523 Occlusion and stenosis of bilateral carotid arteries: Secondary | ICD-10-CM | POA: Diagnosis not present

## 2017-04-19 DIAGNOSIS — I509 Heart failure, unspecified: Secondary | ICD-10-CM | POA: Diagnosis not present

## 2017-04-19 HISTORY — DX: Atherosclerotic heart disease of native coronary artery without angina pectoris: I25.10

## 2017-04-19 HISTORY — DX: Heart failure, unspecified: I50.9

## 2017-04-19 HISTORY — DX: Dyspnea, unspecified: R06.00

## 2017-04-19 LAB — CBC
HEMATOCRIT: 33.9 % — AB (ref 36.0–46.0)
HEMOGLOBIN: 11.5 g/dL — AB (ref 12.0–15.0)
MCH: 31.2 pg (ref 26.0–34.0)
MCHC: 33.9 g/dL (ref 30.0–36.0)
MCV: 91.9 fL (ref 78.0–100.0)
Platelets: 317 10*3/uL (ref 150–400)
RBC: 3.69 MIL/uL — AB (ref 3.87–5.11)
RDW: 14.5 % (ref 11.5–15.5)
WBC: 7.7 10*3/uL (ref 4.0–10.5)

## 2017-04-19 LAB — URINALYSIS, ROUTINE W REFLEX MICROSCOPIC
Bilirubin Urine: NEGATIVE
GLUCOSE, UA: NEGATIVE mg/dL
HGB URINE DIPSTICK: NEGATIVE
KETONES UR: NEGATIVE mg/dL
NITRITE: NEGATIVE
PH: 5 (ref 5.0–8.0)
PROTEIN: NEGATIVE mg/dL
Specific Gravity, Urine: 1.018 (ref 1.005–1.030)

## 2017-04-19 LAB — PULMONARY FUNCTION TEST
DL/VA % pred: 66 %
DL/VA: 3.31 ml/min/mmHg/L
DLCO UNC % PRED: 65 %
DLCO unc: 17.29 ml/min/mmHg
FEF 25-75 Pre: 2.04 L/sec
FEF2575-%Pred-Pre: 143 %
FEV1-%Pred-Pre: 124 %
FEV1-Pre: 2.54 L
FEV1FVC-%PRED-PRE: 101 %
FEV6-%Pred-Pre: 128 %
FEV6-Pre: 3.33 L
FEV6FVC-%Pred-Pre: 103 %
FVC-%Pred-Pre: 124 %
FVC-Pre: 3.4 L
PRE FEV1/FVC RATIO: 75 %
Pre FEV6/FVC Ratio: 98 %
RV % pred: 82 %
RV: 2.05 L
TLC % PRED: 98 %
TLC: 5.18 L

## 2017-04-19 LAB — VAS US DOPPLER PRE CABG
LCCAPDIAS: 13 cm/s
LEFT ECA DIAS: -7 cm/s
LEFT VERTEBRAL DIAS: 11 cm/s
LICADDIAS: -24 cm/s
LICAPDIAS: -8 cm/s
LICAPSYS: -80 cm/s
Left CCA dist dias: -13 cm/s
Left CCA dist sys: -79 cm/s
Left CCA prox sys: 93 cm/s
Left ICA dist sys: -89 cm/s
RCCAPDIAS: 10 cm/s
RIGHT ECA DIAS: -9 cm/s
RIGHT VERTEBRAL DIAS: 5 cm/s
Right CCA prox sys: 100 cm/s
Right cca dist sys: -75 cm/s

## 2017-04-19 LAB — HEMOGLOBIN A1C
HEMOGLOBIN A1C: 6.2 % — AB (ref 4.8–5.6)
MEAN PLASMA GLUCOSE: 131.24 mg/dL

## 2017-04-19 LAB — COMPREHENSIVE METABOLIC PANEL
ALBUMIN: 3.9 g/dL (ref 3.5–5.0)
ALK PHOS: 64 U/L (ref 38–126)
ALT: 9 U/L — AB (ref 14–54)
AST: 20 U/L (ref 15–41)
Anion gap: 14 (ref 5–15)
BILIRUBIN TOTAL: 0.4 mg/dL (ref 0.3–1.2)
BUN: 16 mg/dL (ref 6–20)
CALCIUM: 9.4 mg/dL (ref 8.9–10.3)
CHLORIDE: 100 mmol/L — AB (ref 101–111)
CO2: 16 mmol/L — ABNORMAL LOW (ref 22–32)
CREATININE: 0.7 mg/dL (ref 0.44–1.00)
Glucose, Bld: 85 mg/dL (ref 65–99)
Potassium: 4.6 mmol/L (ref 3.5–5.1)
SODIUM: 130 mmol/L — AB (ref 135–145)
TOTAL PROTEIN: 7.2 g/dL (ref 6.5–8.1)

## 2017-04-19 LAB — SURGICAL PCR SCREEN
MRSA, PCR: NEGATIVE
Staphylococcus aureus: NEGATIVE

## 2017-04-19 LAB — BLOOD GAS, ARTERIAL
Acid-base deficit: 6.2 mmol/L — ABNORMAL HIGH (ref 0.0–2.0)
Bicarbonate: 17.5 mmol/L — ABNORMAL LOW (ref 20.0–28.0)
Drawn by: 470591
FIO2: 21
O2 Saturation: 97.2 %
PATIENT TEMPERATURE: 98.6
PH ART: 7.415 (ref 7.350–7.450)
pCO2 arterial: 27.9 mmHg — ABNORMAL LOW (ref 32.0–48.0)
pO2, Arterial: 96.2 mmHg (ref 83.0–108.0)

## 2017-04-19 LAB — APTT: APTT: 25 s (ref 24–36)

## 2017-04-19 LAB — PROTIME-INR
INR: 0.96
Prothrombin Time: 12.8 seconds (ref 11.4–15.2)

## 2017-04-19 LAB — GLUCOSE, CAPILLARY: Glucose-Capillary: 85 mg/dL (ref 65–99)

## 2017-04-19 NOTE — Progress Notes (Signed)
Pre-op Cardiac Surgery  Carotid Findings:  Findings suggest 1-39% internal carotid artery stenosis bilaterally. Vertebral arteries are patent with antegrade flow.  Upper Extremity Right Left  Brachial Pressures 134-Triphasic 131-Triphasic  Radial Waveforms Triphasic Triphasic  Ulnar Waveforms Triphasic Triphasic  Palmar Arch (Allen's Test) Signal decreases >50% with radial compression and is unaffected with ulnar compression. Signal obliterates with radial compression, is unaffected with ulnar compression.   Lower  Extremity Right Left  Dorsalis Pedis 132-Biphasic 136-Monophasic  Anterior Tibial    Posterior Tibial 152-Triphasic 151-Triphasic  Ankle/Brachial Indices 1.13 1.13    Findings:   Bilateral ABIs are within normal limits at rest.  04/19/2017 3:46 PM Maudry Mayhew, BS, RVT, RDCS, RDMS

## 2017-04-19 NOTE — Progress Notes (Signed)
Cardiologist  Dr, Dorris Carnes

## 2017-04-19 NOTE — Progress Notes (Signed)
Dr. Guy Sandifer office called results of u/a.

## 2017-04-22 ENCOUNTER — Encounter (HOSPITAL_COMMUNITY): Payer: Self-pay

## 2017-04-22 ENCOUNTER — Encounter (HOSPITAL_COMMUNITY): Payer: Self-pay | Admitting: Certified Registered Nurse Anesthetist

## 2017-04-22 ENCOUNTER — Telehealth: Payer: Self-pay

## 2017-04-22 ENCOUNTER — Ambulatory Visit (INDEPENDENT_AMBULATORY_CARE_PROVIDER_SITE_OTHER): Payer: Medicare Other | Admitting: Thoracic Surgery (Cardiothoracic Vascular Surgery)

## 2017-04-22 ENCOUNTER — Encounter: Payer: Self-pay | Admitting: Thoracic Surgery (Cardiothoracic Vascular Surgery)

## 2017-04-22 VITALS — BP 140/70 | HR 90 | Resp 20 | Ht 66.5 in | Wt 130.0 lb

## 2017-04-22 DIAGNOSIS — I340 Nonrheumatic mitral (valve) insufficiency: Secondary | ICD-10-CM

## 2017-04-22 DIAGNOSIS — N3000 Acute cystitis without hematuria: Secondary | ICD-10-CM

## 2017-04-22 DIAGNOSIS — I080 Rheumatic disorders of both mitral and aortic valves: Secondary | ICD-10-CM

## 2017-04-22 DIAGNOSIS — I25118 Atherosclerotic heart disease of native coronary artery with other forms of angina pectoris: Secondary | ICD-10-CM | POA: Diagnosis not present

## 2017-04-22 MED ORDER — DOPAMINE-DEXTROSE 3.2-5 MG/ML-% IV SOLN
0.0000 ug/kg/min | INTRAVENOUS | Status: DC
  Filled 2017-04-22: qty 250

## 2017-04-22 MED ORDER — VANCOMYCIN HCL 10 G IV SOLR
1250.0000 mg | INTRAVENOUS | Status: DC
  Filled 2017-04-22: qty 1250

## 2017-04-22 MED ORDER — TRANEXAMIC ACID (OHS) PUMP PRIME SOLUTION
2.0000 mg/kg | INTRAVENOUS | Status: DC
  Filled 2017-04-22: qty 1.18

## 2017-04-22 MED ORDER — DEXTROSE 5 % IV SOLN
1.5000 g | INTRAVENOUS | Status: AC
  Administered 2017-04-23: .75 g via INTRAVENOUS
  Administered 2017-04-23: 1.5 g via INTRAVENOUS
  Filled 2017-04-22: qty 1.5

## 2017-04-22 MED ORDER — TRANEXAMIC ACID 1000 MG/10ML IV SOLN
1.5000 mg/kg/h | INTRAVENOUS | Status: AC
  Administered 2017-04-23: 1.5 mg/kg/h via INTRAVENOUS
  Filled 2017-04-22: qty 25

## 2017-04-22 MED ORDER — INSULIN REGULAR HUMAN 100 UNIT/ML IJ SOLN
INTRAMUSCULAR | Status: DC
  Filled 2017-04-22: qty 1

## 2017-04-22 MED ORDER — CIPROFLOXACIN HCL 500 MG PO TABS: 500.0000 mg | ORAL_TABLET | Freq: Two times a day (BID) | ORAL | 0 refills | Status: DC

## 2017-04-22 MED ORDER — KENNESTONE BLOOD CARDIOPLEGIA (KBC) MANNITOL SYRINGE (20%, 32ML)
32.0000 mL | Freq: Once | INTRAVENOUS | Status: DC
  Filled 2017-04-22: qty 32

## 2017-04-22 MED ORDER — POTASSIUM CHLORIDE 2 MEQ/ML IV SOLN
80.0000 meq | INTRAVENOUS | Status: DC
  Filled 2017-04-22: qty 40

## 2017-04-22 MED ORDER — MAGNESIUM SULFATE 50 % IJ SOLN
40.0000 meq | INTRAMUSCULAR | Status: DC
  Filled 2017-04-22: qty 10

## 2017-04-22 MED ORDER — METOPROLOL TARTRATE 12.5 MG HALF TABLET
12.5000 mg | ORAL_TABLET | Freq: Once | ORAL | Status: AC
  Administered 2017-04-23: 12.5 mg via ORAL
  Filled 2017-04-22: qty 1

## 2017-04-22 MED ORDER — NITROGLYCERIN IN D5W 200-5 MCG/ML-% IV SOLN
2.0000 ug/min | INTRAVENOUS | Status: DC
  Filled 2017-04-22: qty 250

## 2017-04-22 MED ORDER — PLASMA-LYTE 148 IV SOLN
INTRAVENOUS | Status: AC
  Administered 2017-04-23: 500 mL
  Filled 2017-04-22: qty 2.5

## 2017-04-22 MED ORDER — SODIUM CHLORIDE 0.9 % IV SOLN
INTRAVENOUS | Status: DC
  Filled 2017-04-22: qty 30

## 2017-04-22 MED ORDER — VANCOMYCIN HCL 1000 MG IV SOLR
INTRAVENOUS | Status: AC
  Administered 2017-04-23: 1000 mL
  Filled 2017-04-22: qty 1000

## 2017-04-22 MED ORDER — KENNESTONE BLOOD CARDIOPLEGIA VIAL
13.0000 mL | Freq: Once | Status: DC
  Filled 2017-04-22: qty 13

## 2017-04-22 MED ORDER — DEXTROSE 5 % IV SOLN
750.0000 mg | INTRAVENOUS | Status: DC
  Filled 2017-04-22: qty 750

## 2017-04-22 MED ORDER — SODIUM CHLORIDE 0.9 % IV SOLN
30.0000 ug/min | INTRAVENOUS | Status: DC
  Filled 2017-04-22: qty 2

## 2017-04-22 MED ORDER — DEXTROSE 5 % IV SOLN
0.0000 ug/min | INTRAVENOUS | Status: DC
  Filled 2017-04-22: qty 4

## 2017-04-22 MED ORDER — DEXMEDETOMIDINE HCL IN NACL 400 MCG/100ML IV SOLN
0.1000 ug/kg/h | INTRAVENOUS | Status: AC
  Administered 2017-04-23: .3 ug/kg/h via INTRAVENOUS
  Filled 2017-04-22: qty 100

## 2017-04-22 MED ORDER — GLUTARALDEHYDE 0.625% SOAKING SOLUTION
TOPICAL | Status: DC
  Filled 2017-04-22: qty 50

## 2017-04-22 MED ORDER — TRANEXAMIC ACID (OHS) BOLUS VIA INFUSION
15.0000 mg/kg | INTRAVENOUS | Status: AC
  Administered 2017-04-23: 885 mg via INTRAVENOUS
  Filled 2017-04-22: qty 885

## 2017-04-22 NOTE — Patient Instructions (Signed)
   Continue taking all current medications without change through the day before surgery.  Have nothing to eat or drink after midnight the night before surgery.  On the morning of surgery take only Nexium and diltiazem with a sip of water.

## 2017-04-22 NOTE — Anesthesia Preprocedure Evaluation (Addendum)
Anesthesia Evaluation  Patient identified by MRN, date of birth, ID band Patient awake    Reviewed: Allergy & Precautions, NPO status , Patient's Chart, lab work & pertinent test results  History of Anesthesia Complications (+) PONV and history of anesthetic complications  Airway Mallampati: II  TM Distance: >3 FB Neck ROM: Limited    Dental  (+) Dental Advisory Given, Caps,    Pulmonary neg pulmonary ROS,    breath sounds clear to auscultation       Cardiovascular hypertension, Pt. on medications + CAD and +CHF  + Valvular Problems/Murmurs MVP  Rhythm:Regular Rate:Normal     Neuro/Psych  Headaches, PSYCHIATRIC DISORDERS Anxiety Depression    GI/Hepatic Neg liver ROS, hiatal hernia, GERD  Medicated,  Endo/Other  negative endocrine ROSdiabetes, Type 2, Oral Hypoglycemic Agents  Renal/GU negative Renal ROS     Musculoskeletal  (+) Arthritis , Osteoarthritis,    Abdominal   Peds  Hematology negative hematology ROS (+)   Anesthesia Other Findings Day of surgery medications reviewed with the patient.  Reproductive/Obstetrics                          Lab Results  Component Value Date   WBC 7.7 04/19/2017   HGB 11.5 (L) 04/19/2017   HCT 33.9 (L) 04/19/2017   MCV 91.9 04/19/2017   PLT 317 04/19/2017   Lab Results  Component Value Date   CREATININE 0.70 04/19/2017   BUN 16 04/19/2017   NA 130 (L) 04/19/2017   K 4.6 04/19/2017   CL 100 (L) 04/19/2017   CO2 16 (L) 04/19/2017   Lab Results  Component Value Date   INR 0.96 04/19/2017   INR 1.1 03/21/2017   EKG: NSR  Echo: 1. Normal EF 55-60% 2. Moderately thickened leaflets . Moderate myxomatous degeneration.  Moderate, late systolicprolapse, involving the anterior leaflet and the posterior leaflet.  Doppler:  There was at least moderate regurgitation. Several eccentric jets are seen. Valve area by pressure half-time: 2.47 cm^2.  Indexed valve area by pressure half-time: 1.47 cm^2/m^2. Valve area by continuity equation (using LVOT flow): 2.35 cm^2. Indexed valve area by continuity equation (using LVOT flow): 1.39 cm^2/m^2.  Mean gradient (D): 3 mm Hg. Peak gradient (D): 5 mm Hg.  Anesthesia Physical Anesthesia Plan  ASA: IV  Anesthesia Plan: General   Post-op Pain Management:    Induction: Intravenous  PONV Risk Score and Plan: 4 or greater and Ondansetron, Dexamethasone, Midazolam and Treatment may vary due to age or medical condition  Airway Management Planned: Oral ETT and Video Laryngoscope Planned  Additional Equipment: Arterial line, CVP, PA Cath, 3D TEE and Ultrasound Guidance Line Placement  Intra-op Plan:   Post-operative Plan: Post-operative intubation/ventilation  Informed Consent: I have reviewed the patients History and Physical, chart, labs and discussed the procedure including the risks, benefits and alternatives for the proposed anesthesia with the patient or authorized representative who has indicated his/her understanding and acceptance.   Dental advisory given  Plan Discussed with: CRNA and Anesthesiologist  Anesthesia Plan Comments:       Anesthesia Quick Evaluation

## 2017-04-22 NOTE — H&P (Signed)
[address]       [place] [address]             [phone]          CARDIOTHORACIC SURGERY HISTORY AND PHYSICAL EXAM  Referring Provider is Fay Records, MD PCP is Eulas Post, MD      Chief Complaint  Patient presents with  . Mitral Stenosis    Surgical eval, Cardiac Cath 03/28/17, ECHO TEE 03/04/17    HPI:  Patient is an 83 year old female with history of mitral valve prolapse, hypertension, type 2 diabetes mellitus without complication, scoliosis of the back status post lumbar fusion, and recently discovered coronary artery disease who has been referred for surgical consultation to discuss management of severe symptomatic primary mitral regurgitation and coronary artery disease. The patient states that she has been told that she had a heart murmur for most of her life and she was diagnosed with mitral valve prolapse many years ago at the [facility] clinic. She was told that eventually she might need to have surgery. She has been physically active and functionally independent and overall done quite well from a cardiac standpoint until recently.  She has been followed for several years by Dr. Harrington Challenger and previous echocardiograms have documented the presence of moderate mitral regurgitation with normal left ventricular systolic function. Over the past 2 or 3 months the patient has developed symptoms of exertional shortness of breath and substernal chest pressure. She has noticed significant dyspnea with activity and occasional swelling of both ankles. She has not had resting shortness of breath, PND, orthopnea, or lower extremity edema. She has had substernal chest pressure that typically associated with exertion but also can occur after meals. Symptoms usually relieved by rest. She has had increasing palpitations without dizzy spells or syncope.  Follow-up transthoracic echocardiogram performed 02/12/2017 revealed normal left ventricular systolic function with  mitral valve prolapse and at least moderate or severe mitral regurgitation. The patient subsequently underwent transesophageal echocardiogram on 03/04/2017. This confirmed the presence of normal left ventricular systolic function with bileaflet prolapse of the mitral valve. There were multiple regurgitant jets with one particularly severe jet of regurgitation coursing posteriorly around the left atrium. There was severe left atrial enlargement.  Left and right heart catheterization was performed 03/28/2017. This revealed multivessel coronary artery disease with 100% chronic occlusion of the mid right coronary artery and left to right collateral filling of the terminal branches of the right coronary artery.  There was diffuse nonobstructive disease in the left coronary circulation. Pulmonary artery pressures were normal. The patient was referred for surgical consultation.  The patient is married and lives in [place] with her husband. She has been retired for approximately 15 years having previously worked at [employer] for [employer] at [employer]. She has a total of 3 children one of whom is living, 5 grandchildren and 1 great grandchild. She has remained reasonably active physically all of her adult life. She was limited to some degree by severe scoliosis but she underwent a combination of 2 lumbar fusion procedures by Dr. Ellene Route and has been doing well since then. She complains of a 2-3 month history of exertional shortness of breath and chest pressure as noted previously. She has not had any prolonged episodes of substernal chest discomfort or shortness of breath. Appetite is good. Weight is stable.  She was recently started on Xarelto by Dr. Harrington Challenger. There is no documented history of atrial fibrillation or atrial  flutter.  Patient returns to the office today with plans to proceed with elective mitral valve repair and coronary artery bypass grafting in the operating room tomorrow. She was originally  seen in consultation on 04/05/2017. Since then she reports no new problems or complaints.  She stops taking Xarelto last week in anticipation of surgery and began taking amiodarone which was ordered prophylactically. She did not tolerate amiodarone due to nausea.    Past Medical History:  Diagnosis Date  . Allergy   . Anxiety   . Arthritis   . Atrial enlargement, left   . CHF (congestive heart failure) ([place])   . Coronary artery disease   . Depression   . Diabetes mellitus without complication (HCC)    fasting cbg 110s  . Dyspnea   . GERD (gastroesophageal reflux disease)   . H/O hiatal hernia   . Headache(784.0)   . Heart murmur    06/10/2014 seeing new cardiologist  . Heart murmur   . HOH (hard of hearing)    HOH in left ear; needs to speak to patient in right ear  . Hyperlipidemia   . Hypertension   . Mitral regurgitation   . Mitral valve prolapse   . Palpitations    afib on 03/04/17 EKG  . PONV (postoperative nausea and vomiting)    Patient stated "I like the patch behind my ear"  . 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 UTI (lower urinary tract infection)     Past Surgical History:  Procedure Laterality Date  . ABDOMINAL HYSTERECTOMY    . Anterior posterior and enterocele repairs  09/21/2004   With uterosacral cardinal colposuspension, partial colpocleisis; Selinda Orion, MD  . BACK SURGERY    . BREAST ENHANCEMENT SURGERY  02/25/2002   Bilateral reduction and excision of accessory breast tissue underneath the left breast; Aretha Parrot., MD  . BREAST SURGERY  11   reduction  . CARDIAC CATHETERIZATION     no PCI  . CATARACT EXTRACTION Bilateral   . COLONOSCOPY    . EYE SURGERY Bilateral    cataract removal  . LUMBAR LAMINECTOMY/ DECOMPRESSION WITH MET-RX N/A 03/16/2014   Procedure: LUMBAR FIVE-SACRAL ONE EXTRAFORAMINAL DISKECTOMY WITH METREX;  Surgeon: Kristeen Miss, MD;  Location: [facility];  Service: Neurosurgery;  Laterality: N/A;  . LUMBAR LAMINECTOMY/DECOMPRESSION  MICRODISCECTOMY Left 06/08/2014   Procedure: Left Lumbar Five-Sacral One Microdiskectomy;  Surgeon: Kristeen Miss, MD;  Location: [facility];  Service: Neurosurgery;  Laterality: Left;  Left L5-S1 Microdiskectomy  . PELVIC FLOOR REPAIR    . POSTERIOR LUMBAR FUSION 4 LEVEL N/A 08/08/2015   Procedure: T8-L1 posterior lateral fusion with decompression T12-L1;  Surgeon: Kristeen Miss, MD;  Location: [facility];  Service: Neurosurgery;  Laterality: N/A;  T8-L1 posterior lateral fusion with decompression T12-L1  . RIGHT/LEFT HEART CATH AND CORONARY ANGIOGRAPHY N/A 03/28/2017   Procedure: Right/Left Heart Cath and Coronary Angiography;  Surgeon: Nelva Bush, MD;  Location: [facility];  Service: Cardiovascular;  Laterality: N/A;  . ROTATOR CUFF REPAIR Bilateral    11.12  . TEE WITHOUT CARDIOVERSION N/A 03/04/2017   Procedure: TRANSESOPHAGEAL ECHOCARDIOGRAM (TEE);  Surgeon: Fay Records, MD;  Location: [facility] ENDOS[facility];  Service: Cardiovascular;  Laterality: N/A;  . TONSILLECTOMY AND ADENOIDECTOMY      Family History  Problem Relation Age of Onset  . Heart failure Father   . Arthritis Father   . Hyperlipidemia Father   . Heart disease Father   . Arthritis Mother   .  Hyperlipidemia Mother   . Hypertension Mother     Social History Social History  Substance Use Topics  . Smoking status: Never Smoker  . Smokeless tobacco: Never Used  . Alcohol use No    Prior to Admission medications   Medication Sig Start Date End Date Taking? Authorizing Provider  bisacodyl (DULCOLAX) 5 MG EC tablet Take 10 mg by mouth daily as needed (for constipation.).   Yes [provider]  citalopram (CELEXA) 20 MG tablet Take 1 tablet (20 mg total) by mouth daily. 02/06/17  Yes Burchette, Alinda Sierras, MD  clonazePAM (KLONOPIN) 1 MG tablet Take one po qhs prn insomnia and severe anxiety. May refill on or after Jan 09 2016. Patient taking differently: Take 1 mg by mouth at bedtime. May refill on or after  Jan 09 2016. 02/06/17  Yes Burchette, Alinda Sierras, MD  conjugated estrogens (PREMARIN) vaginal cream Place 1 Applicatorful vaginally daily. Patient taking differently: Place 1 Applicatorful vaginally every 14 (fourteen) days.  02/06/17  Yes Burchette, Alinda Sierras, MD  diltiazem (DILACOR XR) 240 MG 24 hr capsule Take 1 capsule (240 mg total) by mouth daily. 02/15/17  Yes Fay Records, MD  esomeprazole (NEXIUM) 20 MG capsule Take 20 mg by mouth daily at 12 noon.   Yes [provider]  famotidine (PEPCID) 20 MG tablet Take 20 mg by mouth daily as needed for heartburn or indigestion.   Yes [provider]  fexofenadine (ALLEGRA) 180 MG tablet Take 90 mg by mouth daily as needed for allergies or rhinitis (allergy headache).   Yes [provider]  fluticasone (FLONASE) 50 MCG/ACT nasal spray SHAKE LIQUID AND USE 2 SPRAYS IN EACH NOSTRIL DAILY AS NEEDED FOR ALLERGIES 09/14/16  Yes Burchette, Alinda Sierras, MD  ibuprofen (ADVIL,MOTRIN) 200 MG tablet Take 400 mg by mouth daily as needed for headache or mild pain.    Yes [provider]  lisinopril-hydrochlorothiazide (PRINZIDE,ZESTORETIC) 10-12.5 MG tablet Take 1 tablet by mouth daily. 02/15/17  Yes Fay Records, MD  metFORMIN (GLUCOPHAGE) 500 MG tablet Take 2 tablets (1,000 mg total) by mouth 2 (two) times daily with a meal. 03/31/17  Yes End, Harrell Gave, MD  Multiple Vitamins-Minerals (ICAPS AREDS 2) CAPS Take 1 capsule by mouth 2 (two) times daily.   Yes [provider]  Probiotic Product (PROBIOTIC DAILY PO) Take 2 capsules by mouth daily.    Yes [provider]  trimethoprim (TRIMPEX) 100 MG tablet TAKE 1 TABLET(100 MG) BY MOUTH DAILY Patient taking differently: TAKE 1 TABLET(100 MG) BY MOUTH AT BEDTIME 10/29/16  Yes Burchette, Alinda Sierras, MD  amiodarone (PACERONE) 200 MG tablet Take 1 tablet (200 mg total) by mouth 2 (two) times daily. Patient not taking: Reported on 04/18/2017 04/16/17   [name], MD  [facility] DELICA  LANCETS 35T MISC USE TO TEST TWICE DAILY AS DIRECTED 11/13/16   Burchette, Alinda Sierras, MD  ONETOUCH VERIO test strip USE TO CHECK BLOOD GLUCOSE TWICE DAILY AS DIRECTED 05/30/16   Burchette, Alinda Sierras, MD  rivaroxaban (XARELTO) 20 MG TABS tablet Take 1 tablet (20 mg total) by mouth daily with supper. Restart day after catheterization if no bleeding or swelling at catheterization site. Patient not taking: Reported on 04/22/2017 03/29/17   End, Harrell Gave, MD    Allergies  Allergen Reactions  . Cortisone Other (See Comments)    Insomnia, heart palpitations (po only)  . Statins Other (See Comments)    Myalgias   . Codeine Swelling  FACIAL SWELLING SEVERITY UNKNOWN  . Sulfonamide Derivatives     UNSPECIFIED REACTION   . Chlorhexidine Gluconate [Chlorhexidine] Itching  . Lactose Intolerance (Gi) Nausea And Vomiting     Review of Systems:              General:                      normal appetite, normal energy, no weight gain, no weight loss, no fever             Cardiac:                       + chest pain with exertion, no chest pain at rest, + SOB with exertion, no resting SOB, no PND, no orthopnea, + palpitations, no arrhythmia, no atrial fibrillation, + LE edema, no dizzy spells, no syncope             Respiratory:                 + exertional shortness of breath, no home oxygen, no productive cough, no dry cough, no bronchitis, no wheezing, no hemoptysis, no asthma, no pain with inspiration or cough, no sleep apnea, no CPAP at night             GI:                               no difficulty swallowing, no reflux, no frequent heartburn, + hiatal hernia, no abdominal pain, no constipation, no diarrhea, no hematochezia, no hematemesis, no melena             GU:                              no dysuria,  no frequency, no urinary tract infection, no hematuria, no kidney stones, no kidney disease             Vascular:                     no pain suggestive of claudication, no pain in feet, no leg  cramps, + varicose veins, no DVT, no non-healing foot ulcer             Neuro:                         no stroke, no TIA's, no seizures, no headaches, no temporary blindness one eye,  no slurred speech, no peripheral neuropathy, mild chronic pain, very mild instability of gait, no memory/cognitive dysfunction             Musculoskeletal:         + arthritis, no joint swelling, no myalgias, no difficulty walking, normal mobility              Skin:                            no rash, no itching, no skin infections, no pressure sores or ulcerations             Psych:                         + anxiety, no depression, no nervousness, no unusual recent stress  Eyes:                           no blurry vision, no floaters, no recent vision changes, + wears glasses or contacts             ENT:                            + hearing loss, no loose or painful teeth, no dentures, last saw dentist 03/2017             Hematologic:               no easy bruising, no abnormal bleeding, no clotting disorder, no frequent epistaxis             Endocrine:                   + diabetes, does check CBG's at home                           Physical Exam:              BP 135/77   Pulse 90   Resp 18   Ht 5' 6.5" (1.689 m)   Wt 130 lb (59 kg)   SpO2 97% Comment: RA  BMI 20.67 kg/m              General:                      Thin, elderly and somewhat frail-appearing             HEENT:                       Unremarkable              Neck:                           no JVD, no bruits, no adenopathy              Chest:                          clear to auscultation, symmetrical breath sounds, no wheezes, no rhonchi              CV:                              RRR, grade IV/VI systolic murmur best at apex             Abdomen:                    soft, non-tender, no masses              Extremities:                 warm, well-perfused, pulses diminished, + LE edema             Rectal/GU                    Deferred             Neuro:  Grossly non-focal and symmetrical throughout             Skin:                            Clean and dry, no rashes, no breakdown   Diagnostic Tests:  Transthoracic Echocardiography  Patient: Amelda, Hapke MR #: [id number] Study Date: 02/12/2017 Gender: F Age: 56 Height: 168.9 cm Weight: 60.6 kg BSA: 1.69 m^2 Pt. Status: Room:  Daleen Squibb, M.D. REFERRING Dorris Carnes, M.D. REFERRING Burchette, Alinda Sierras ATTENDING Sanda Klein, MD SONOGRAPHER Marygrace Drought, RCS PERFORMING [facility], Outpatient  cc:  ------------------------------------------------------------------- LV EF: 55% - 60%  ------------------------------------------------------------------- Indications: Mitral valve disorder (I05.9).  ------------------------------------------------------------------- History: PMH: Hyperlipidemia. Risk factors: Hypertension. Diabetes mellitus.  ------------------------------------------------------------------- Study Conclusions  - Left ventricle: Systolic function was normal. The estimated ejection fraction was in the range of 55% to 60%. Wall motion was normal; there were no regional wall motion abnormalities. - Mitral valve: Moderate, late systolicprolapse, involving the anterior leaflet and the posterior leaflet. There was at least moderate regurgitation. Several eccentric jets are seen. - Left atrium: The atrium was severely dilated.  Impressions:  - Consider TEE, especially if the patient is a surgical candidate for MV repair. MR severity may be underestimated.  ------------------------------------------------------------------- Study data: Comparison was made to the study of 12/01/2015. Study status: Routine. Procedure: The patient reported no pain pre or post test. Transthoracic echocardiography. Image quality  was adequate. Transthoracic echocardiography. M-mode, complete 2D, spectral Doppler, and color Doppler. Birthdate: Patient birthdate: [birth year]. Age: Patient is 83 yr old. Sex: Gender: female. BMI: 21.3 kg/m^2. Blood pressure: 122/64 Patient status: Outpatient. Study date: Study date: 02/12/2017. Study time: 09:21 AM. Location: [facility]  -------------------------------------------------------------------  ------------------------------------------------------------------- Left ventricle: Systolic function was normal. The estimated ejection fraction was in the range of 55% to 60%. Wall motion was normal; there were no regional wall motion abnormalities.  ------------------------------------------------------------------- Aortic valve: Structurally normal valve. Trileaflet. Cusp separation was normal. Doppler: Transvalvular velocity was within the normal range. There was no stenosis. There was no regurgitation.  ------------------------------------------------------------------- Aorta: Aortic root: The aortic root was normal in size. Ascending aorta: The ascending aorta was normal in size.  ------------------------------------------------------------------- Mitral valve: Moderately thickened leaflets . Moderate myxomatous degeneration. Moderate, late systolicprolapse, involving the anterior leaflet and the posterior leaflet. Doppler: There was at least moderate regurgitation. Several eccentric jets are seen. Valve area by pressure half-time: 2.47 cm^2. Indexed valve area by pressure half-time: 1.47 cm^2/m^2. Valve area by continuity equation (using LVOT flow): 2.35 cm^2. Indexed valve area by continuity equation (using LVOT flow): 1.39 cm^2/m^2. Mean gradient (D): 3 mm Hg. Peak gradient (D): 5 mm Hg.  ------------------------------------------------------------------- Left atrium: The atrium was severely  dilated.  ------------------------------------------------------------------- Right ventricle: The cavity size was normal.  ------------------------------------------------------------------- Pulmonic valve: Poorly visualized. Doppler: There was no significant regurgitation.  ------------------------------------------------------------------- Tricuspid valve: Structurally normal valve. Leaflet separation was normal. Doppler: Transvalvular velocity was within the normal range. There was mild regurgitation.  ------------------------------------------------------------------- Pulmonary artery: Systolic pressure was within the normal range.  ------------------------------------------------------------------- Right atrium: The atrium was normal in size.  ------------------------------------------------------------------- Systemic veins: Inferior vena cava: The vessel was normal in size. The respirophasic diameter changes were in the normal range (= 50%), consistent with normal central venous pressure. Diameter: 13 mm.  ------------------------------------------------------------------- Measurements  IVC Value Reference ID 13 mm ---------  Left ventricle Value Reference LV ID, ED, PLAX chordal (L) 41.79 mm  43 - 52 LV ID, ES, PLAX chordal 26.73 mm 23 - 38 LV fx shortening, PLAX chordal 36 % >=29 LV PW thickness, ED 12.7 mm --------- IVS/LV PW ratio, ED 0.8 <=1.3 Stroke volume, 2D 82 ml --------- Stroke volume/bsa, 2D 49 ml/m^2 --------- LV e&', lateral 9.25 cm/s --------- LV E/e&', lateral  12.65 --------- LV e&', medial 5.55 cm/s --------- LV E/e&', medial 21.08 --------- LV e&', average 7.4 cm/s --------- LV E/e&', average 15.81 ---------  Ventricular septum Value Reference IVS thickness, ED 10.2 mm ---------  LVOT Value Reference LVOT ID, S 20 mm --------- LVOT area 3.14 cm^2 --------- LVOT peak velocity, S 79.1 cm/s --------- LVOT mean velocity, S 54.4 cm/s --------- LVOT VTI, S 26 cm ---------  Aorta Value Reference Aortic root ID, ED 29 mm ---------  Left atrium Value Reference LA ID, A-P, ES 55 mm --------- LA ID/bsa, A-P (H) 3.26 cm/m^2 <=2.2 LA volume, S 92.1 ml --------- LA volume/bsa, S 54.6 ml/m^2 --------- LA volume, ES, 1-p A4C 85.2 ml --------- LA volume/bsa, ES, 1-p A4C 50.5 ml/m^2 --------- LA volume, ES, 1-p A2C 83 ml --------- LA volume/bsa, ES, 1-p A2C 49.2 ml/m^2 ---------  Mitral valve Value Reference Mitral E-wave peak velocity 117 cm/s --------- Mitral A-wave peak velocity 123 cm/s --------- Mitral mean velocity, D 76.8 cm/s --------- Mitral deceleration  time 173 ms 150 - 230 Mitral pressure half-time 69 ms --------- Mitral mean gradient, D 3 mm Hg --------- Mitral peak gradient, D 5 mm Hg --------- Mitral E/A ratio, peak 1 --------- Mitral valve area, PHT, DP 2.47 cm^2 --------- Mitral valve area/bsa, PHT, DP 1.47 cm^2/m^2 --------- Mitral valve area, LVOT 2.35 cm^2 --------- continuity Mitral valve area/bsa, LVOT 1.39 cm^2/m^2 --------- continuity Mitral annulus VTI, D 34.8 cm --------- Mitral regurg VTI, PISA 205 cm --------- Mitral ERO, PISA 0.16 cm^2 --------- Mitral regurg volume, PISA 33 ml ---------  Pulmonary arteries Value Reference PA pressure, S, DP 22 mm Hg <=30  Tricuspid valve Value Reference Tricuspid regurg peak velocity 217 cm/s --------- Tricuspid peak RV-RA gradient 19 mm Hg --------- Tricuspid maximal regurg 217 cm/s --------- velocity, PISA  Systemic veins Value Reference Estimated CVP 3 mm Hg ---------  Right ventricle Value Reference TAPSE 20.3 mm --------- RV pressure, S, DP 22 mm Hg <=30 RV s&', lateral, S 17 cm/s ---------  Legend: (L) and (H) mark values outside specified reference range.  ------------------------------------------------------------------- Prepared and Electronically Authenticated  by  Sanda Klein, MD 2018-06-05T11:03:21   Transesophageal Echocardiography  Patient: Akshaya, Toepfer MR #: [id number] Study Date: 03/04/2017 Gender: F Age: 36 Height: Weight: BSA: Pt. Status: Room:  ADMITTING Dorris Carnes, M.D. ATTENDING Dorris Carnes, M.D. ORDERING Dorris Carnes, M.D. PERFORMING Dorris Carnes, M.D. REFERRING Dorris Carnes, M.D. SONOGRAPHER Dustin Flock, RCS  cc:  -------------------------------------------------------------------  ------------------------------------------------------------------- Indications: Mitral regurgitation 424.0.  ------------------------------------------------------------------- History: PMH: Atrial fibrillation. Mitral valve disease.  ------------------------------------------------------------------- Study data: Study status: Routine. Consent: The risks, benefits, and alternatives to the procedure were explained to the patient and informed consent was obtained. Procedure: The patient reported no pain pre or post test. Initial setup. The patient was brought to the laboratory. Surface ECG leads were monitored. Sedation. Conscious sedation was administered by cardiology staff. Transesophageal echocardiography. Topical anesthesia was obtained using viscous lidocaine. A transesophageal probe was inserted by the attending cardiologistwithout difficulty. Image quality was adequate. Study completion: The patient tolerated the procedure well. There were no complications. Administered medications: Fentanyl, 28mg, IV. Midazolam, 828m IV. Diphenhydramine, 2546mIV. Diagnostic transesophageal echocardiography. 2D and  color Doppler. Birthdate: Patient birthdate: [birth year]. Age: Patient is 83 yr old. Sex: Gender: female. Blood pressure: 108/73 Patient status: Outpatient. Study date: Study date: 03/04/2017. Study time: 09:17 AM. Location:  Endoscopy.  -------------------------------------------------------------------  ------------------------------------------------------------------- Left ventricle: LVEF is normal. LVEF is normal.  ------------------------------------------------------------------- Aortic valve: AV is mildly thickened. AV is mildly thickened. No AI.  ------------------------------------------------------------------- Aorta: Mild fixed plaquing of the thoracic aorta MIld fixed plaquing of the thoracic aorta.  ------------------------------------------------------------------- Mitral valve: Mild bileaflet prolapse of the MV There are a couple regurgitant jets The predominant one is directed posterior to back of LA It does not reflux into pulmonary vein but does touch back wall (3-4/4)  ------------------------------------------------------------------- Left atrium: Left atrium is large LA appendage is without masses. Left atirum is severely dilated  ------------------------------------------------------------------- Right ventricle: RVEF is normal. RVEF is normal.  ------------------------------------------------------------------- Pulmonic valve: PV is normal. PV is normal.  ------------------------------------------------------------------- Tricuspid valve: TV is normal Mild TR TV is normal MIld TR  ------------------------------------------------------------------- Post procedure conclusions Ascending Aorta:  - Mild fixed plaquing of the thoracic aorta MIld fixed plaquing of the thoracic aorta.  ------------------------------------------------------------------- Prepared and Electronically Authenticated by  Dorris Carnes, M.D. 2018-06-29T00:26:38   CT ANGIOGRAPHY CHEST, ABDOMEN AND PELVIS  TECHNIQUE: Multidetector CT imaging through the chest, abdomen and pelvis was performed using the standard protocol during bolus administration of intravenous contrast.  Multiplanar reconstructed images and MIPs were obtained and reviewed to evaluate the vascular anatomy.  CONTRAST: 75 mL Isovue 370  COMPARISON: Prior CT scan of the lumbar spine 05/25/2014  FINDINGS: CTA CHEST FINDINGS  Cardiovascular: Conventional 3 vessel aortic arch. Mild ectasia of the ascending thoracic aorta to 3.5 cm. The aortic root is normal in caliber. The arch and descending thoracic aorta are also normal in caliber. There is mild atherosclerotic vascular calcification. Trace calcifications are also present at the left main, left anterior descending, circumflex and right coronary arteries. Marked left atrial dilatation. Thickened mitral valve. The remainder the cardiac structures are normal in size. No pericardial effusion.  Mediastinum/Nodes: Unremarkable CT appearance of the thyroid gland. No suspicious mediastinal or hilar adenopathy. No soft tissue mediastinal mass. Moderately large sliding hiatal hernia.  Lungs/Pleura: Lungs are clear. No pleural effusion or pneumothorax.  Musculoskeletal: No acute osseous abnormality. Long segment posterior thoracic fusion.  Review of the MIP images confirms the above findings.  CTA ABDOMEN AND PELVIS FINDINGS  VASCULAR  Aorta: Atherosclerotic vascular calcifications without evidence of aneurysm or dissection.  Celiac: Widely patent. No aneurysm or dissection.  SMA: Widely patent. No aneurysm or dissection.  Renals: Single right renal artery. Mild atherosclerotic plaque at the origin with perhaps mild stenosis. Dominant left renal artery is also widely patent. There is a small accessory artery to the right lower pole. No changes of fibromuscular dysplasia.  IMA: Patent with normal antegrade flow.  Inflow: Minimal heterogeneous atherosclerotic plaque in the proximal common iliac arteries. No significant stenosis. The internal iliac arteries are patent. The external iliac arteries are spared from  any disease.  Veins: No focal venous abnormality.  Review of the MIP images confirms the above findings.  NON-VASCULAR  Hepatobiliary: Low-attenuation lesion with peripheral incomplete nodular enhancement in the hepatic dome measures 4.3 x 3.3 cm and is consistent with a benign hemangioma. There is a second similar-appearing lesion in the posterior aspect of hepatic segment 7 which measures up to 5 cm in diameter. A third smaller lesion is also present in hepatic segment 7 and measures only 1.4 cm. Another similar lesion in  the left hepatic lobe measures up to 1.4 cm. Finally, there is a circumscribed nonenhancing cystic lesion in hepatic segment 3 which measures 2.4 cm in diameter. No evidence of biliary ductal dilatation. Cholelithiasis.  Pancreas: Unremarkable. No pancreatic ductal dilatation or surrounding inflammatory changes.  Spleen: Normal in size without focal abnormality.  Adrenals/Urinary Tract: Normal adrenal glands. No evidence of hydronephrosis or enhancing renal mass. Nonobstructing 3- 4 mm stone in the lower pole of the left kidney. Adjacent punctate 1 mm stone also in the lower pole of the left kidney. The ureters are unremarkable.  Stomach/Bowel: No evidence of bowel obstruction or focal bowel wall thickening. Colonic diverticular disease without CT evidence of active inflammation.  Lymphatic: No suspicious lymphadenopathy.  Reproductive: Status post hysterectomy. No adnexal masses.  Other: Small fat containing umbilical hernia. No abdominopelvic ascites.  Musculoskeletal: No acute osseous abnormality. Extensive multi level thoraco lumbar fusion extending from T7 through S1.  Review of the MIP images confirms the above findings.  IMPRESSION: CTA CHEST  1. No evidence of acute aortic abnormality. 2. Mild ectasia of the ascending thoracic aorta with a maximal diameter of 3.5 cm. 3. Coronary and aortic atherosclerotic calcifications.  Aortic Atherosclerosis (ICD10-170.0) 4. Thickened mitral valve with prominent dilatation of the left atrium consistent with the clinical history of mitral valve disease. 5. Moderately large sliding hiatal hernia.  CTA ABD/PELVIS  1. No evidence of abdominal aortic aneurysm or acute aortic abnormality. 2. Scattered atherosclerotic vascular calcifications. 3. Multiple hepatic lesions the appearance of which are most consistent with a combination of a combination of benign hemangiomas and a simple cyst. 4. Cholelithiasis. 5. Colonic diverticular disease without CT evidence of active inflammation. 6. Nonobstructing left lower pole nephrolithiasis. 7. Small fat containing umbilical hernia. 8. Extensive multilevel thoracolumbar posterior fusion extending from T7 through S1.  Signed,  Criselda Peaches, MD  [facility]  [facility]   Electronically Signed By: Jacqulynn Cadet M.D. On: 04/15/2017 11:27    Impression:  Patient has mitral valve prolapse with stage D severe symptomatic primary mitral regurgitation and multivessel coronary artery disease. She presents with a several month history of symptoms of exertional shortness of breath, palpitations, andlower extremity edema consistent with chronic diastolic congestive heart failure, [place] Heart Association functional class II. She also has been having exertional chest pressure consistent with stable angina pectoris. I have personally reviewed the patient's recent transthoracic and transesophageal echocardiograms and diagnostic cardiac catheterization. Echocardiograms revealed the presence of bileaflet prolapse and mitral valve. Both leaflets are somewhat thickened and there is severe prolapse involving the posterior leaflet. However, there are no obvious ruptured chordae tendineae nor are there any flail segments. There are multiple jets of regurgitation. The  leaflets of the valve are somewhat thickened. There does not appear to be significant calcification. The severity of mitral regurgitation actually looked a bit worse on transthoracic echo than it did on transesophageal echo. Left ventricular systolic function appears normal. Diagnostic cardiac catheterization reveals multivessel coronary artery disease with 100% occlusion of the mid right coronary artery, left to right collateral filling of the terminal branches of the right coronary artery, and nonobstructive disease in the left circumflex system. Options at this time include continued medical therapy versus surgical intervention. Although the patient is somewhat elderly, the risks associated with surgery should be relatively low.  Plan:  The patient and her husband were againcounseled at length regarding the indications, risks and potential benefits of mitral valve repair and coronary artery bypass grafting.  The rationale for elective surgery has been explained, including a comparison between surgery and continued medical therapy with close follow-up.  expectations for her postoperative convalescence at been discussed. The likelihood of successful and durable valve repair has been discussed with particular reference to the findings of their recent echocardiogram. Based upon these findings and previous experience, I have quoted them a greater than 50percent likelihood of successful valve repair. In the unlikely event that their valve cannot be successfully repaired, we discussed the possibility of replacing the mitral valve using a mechanical prosthesis with the attendant need for long-term anticoagulation versus the alternative of replacing it using a bioprosthetic tissue valve with its potential for late structural valve deterioration and failure, depending upon the patient's longevity. The patient specifically requests that if the mitral valve must be replaced that it be done using a bioprosthetic  tissuevalve. The patient understands and accepts all potential risks of surgery including but not limited to risk of death, stroke or other neurologic complication, myocardial infarction, congestive heart failure, respiratory failure, renal failure, bleeding requiring transfusion and/or reexploration, arrhythmia, infection or other wound complications, pneumonia, pleural and/or pericardial effusion, pulmonary embolus, aortic dissection or other major vascular complication, or delayed complications related to valve repair or replacement including but not limited to structural valve deterioration and failure, thrombosis, embolization, endocarditis, paravalvular leak, or late recurrence of symptomatic coronary artery disease. All of their questions have been answered.  I spent in excess of 15 minutes during the conduct of this office consultation and >50% of this time involved direct face-to-face encounter with the patient for counseling and/or coordination of their care.    Valentina Gu. Roxy Manns, MD 04/22/2017 10:37 AM

## 2017-04-22 NOTE — Telephone Encounter (Signed)
RX for Cipro 500 mg po BID x 1 day #2 no refill called to [facility]

## 2017-04-22 NOTE — Progress Notes (Signed)
Anesthesia Chart Review: Patient is 83 year old female scheduled mitral valve repair or replacement and CABG on 04/23/17 by Dr. Roxy Manns.   - PCP is Dr. Carolann Littler,  - Cardiologist is Dr. Harrington Challenger.  PMH includes MVP/severe MR, CAD, palpitations (afib noted 03/04/17 EKG), CHF, HTN, hyperlipidemia, DM2, post-operative N/V, anxiety, positive TB test, GERD, hard of hearing (left ear). Never smoker. BMI 21. S/p L5-S1 microdiskectomy 06/08/14. S/p L5-S1 extraforaminal diskecotmy 03/16/14. S/p L5-S1 PLIF 03/08/15. S/p T8-L1 posterior lateral fusion with decompression T12-L1 08/08/15. S/p breast augmentation '03.  Meds include amiodarone (Dr. Roxy Manns instructed patient to start 1 week prior to surgery; however, patient reported that she stopped after 2 doses due to GI upset; I sent Dr. Roxy Manns and [facility] RN Thurmond Butts a message relaying this), Celexa, Klonopin, diltiazem, Nexium, Pepcid, Allegra, Flonase, lisinopril-HCTZ, metformin, Xarelto (Dr. Roxy Manns instructed to hold 7 days prior to surgery), trimethoprim (UTI prevention).  BP (!) 107/54   Pulse 82   Temp 36.8 C   Resp 18   Ht 5' 6.5" (1.689 m)   Wt 130 lb (59 kg)   SpO2 99%   BMI 20.67 kg/m   EKG 04/19/17: NSR, septal infarct (age undetermined). Afib resolved since 03/04/17.   Cardiac cath 03/28/17: Conclusions: 1. Severe single-vessel coronary artery disease with 60% proximal and 99% distal RCA stenoses as well as chronic total occlusion of the ostial rPDA. The rPDA andr PL branches are supplied by left-to-right collaterals. 2. 70% proximal D2 stenosis is evident. Otherwise, there is mild, nonobstructive disease involving the LAD, LCx, and their branches. 3. Normal left and right heart filling pressures. Normal pulmonary artery pressure. Normal Fick cardiac output/index. Recommendations: 1. Aggressive secondary prevention of CAD and medical therapy. 2. Defer decision of continued medical management versus revascularization at the time of mitral valve surgery  to Drs. Roxy Manns and [employer]. 3. Restart rivaroxaban tomorrow if no evidence of bleeding or other vascular complication at site of catheterization.  TEE 03/04/17: Left ventricle:  LVEF is normal. LVEF is normal. Aortic valve:  AV is mildly thickened. AV is mildly thickened. No AI. Aorta:  Mild fixed plaquing of the thoracic aorta MIld fixed plaquing of the thoracic aorta. Mitral valve:  Mild bileaflet prolapse of the MV There are a couple regurgitant jets The predominant one is directed posterior to back of LA It does not reflux into pulmonary vein but does touch back wall (3-4/4) Left atrium:  Left atrium is large LA appendage is without masses. Left atirum is severely dilated Right ventricle:  RVEF is normal. RVEF is normal. Pulmonic valve:   PV is normal. PV is normal. Tricuspid valve:  TV is normal Mild TR TV is normal MIld TR Ascending Aorta: Mild fixed plaquing of the thoracic aorta Mild fixed plaquing of the thoracic aorta. (02/12/17 TTE: Mitral valve showed moderate late systolicprolapse, involving the anterior leaflet and the posterior leaflet. There was at least moderate regurgitation.)  Carotid U/S 04/19/17: Summary: Findings suggest 1-39% internal carotid artery stenosis bilaterally. Vertebral arteries are patent with antegrade flow.  CXR 04/19/17: IMPRESSION: No active cardiopulmonary disease.  PFTs 04/19/17: FVC 3.40 (124%), FEV1 2.54 (124%), DLCOunc 17.29 (65%).  Preoperative labs reviewed. A1c 6.2. UA showed moderate leukocytes, negative nitrites, 6-30 WBC, rare bacteria. (PAT RN called UA results to [facility].   If no acute changes then I anticipate that she can proceed as planned.  George Hugh [facility] Stay [facility]/Anesthesiology Phone [phone] 04/22/2017 10:46 AM

## 2017-04-22 NOTE — Progress Notes (Signed)
[address]       [place] [address]             [phone]     CARDIOTHORACIC SURGERY OFFICE NOTE  Referring Provider is Fay Records, MD PCP is Eulas Post, MD   HPI:  Patient returns to the office today with plans to proceed with elective mitral valve repair and coronary artery bypass grafting in the operating room tomorrow. She was originally seen in consultation on 04/05/2017. Since then she reports no new problems or complaints.  She stops taking Xarelto last week in anticipation of surgery and began taking amiodarone which was ordered prophylactically. She did not tolerate amiodarone due to nausea.   Current Outpatient Prescriptions  Medication Sig Dispense Refill  . bisacodyl (DULCOLAX) 5 MG EC tablet Take 10 mg by mouth daily as needed (for constipation.).    [name] citalopram (CELEXA) 20 MG tablet Take 1 tablet (20 mg total) by mouth daily. 90 tablet 3  . clonazePAM (KLONOPIN) 1 MG tablet Take one po qhs prn insomnia and severe anxiety. May refill on or after Jan 09 2016. (Patient taking differently: Take 1 mg by mouth at bedtime. May refill on or after Jan 09 2016.) 30 tablet 3  . conjugated estrogens (PREMARIN) vaginal cream Place 1 Applicatorful vaginally daily. (Patient taking differently: Place 1 Applicatorful vaginally every 14 (fourteen) days. ) 42.5 g 12  . diltiazem (DILACOR XR) 240 MG 24 hr capsule Take 1 capsule (240 mg total) by mouth daily. 90 capsule 3  . esomeprazole (NEXIUM) 20 MG capsule Take 20 mg by mouth daily at 12 noon.    . famotidine (PEPCID) 20 MG tablet Take 20 mg by mouth daily as needed for heartburn or indigestion.    . fexofenadine (ALLEGRA) 180 MG tablet Take 90 mg by mouth daily as needed for allergies or rhinitis (allergy headache).    . fluticasone (FLONASE) 50 MCG/ACT nasal spray SHAKE LIQUID AND USE 2 SPRAYS IN EACH NOSTRIL DAILY AS NEEDED FOR ALLERGIES 16 g 2  . ibuprofen (ADVIL,MOTRIN) 200 MG tablet Take 400 mg by  mouth daily as needed for headache or mild pain.     [name] lisinopril-hydrochlorothiazide (PRINZIDE,ZESTORETIC) 10-12.5 MG tablet Take 1 tablet by mouth daily. 90 tablet 3  . metFORMIN (GLUCOPHAGE) 500 MG tablet Take 2 tablets (1,000 mg total) by mouth 2 (two) times daily with a meal. 360 tablet 3  . Multiple Vitamins-Minerals (ICAPS AREDS 2) CAPS Take 1 capsule by mouth 2 (two) times daily.    Glory Rosebush DELICA LANCETS 32I MISC USE TO TEST TWICE DAILY AS DIRECTED 100 each 0  . ONETOUCH VERIO test strip USE TO CHECK BLOOD GLUCOSE TWICE DAILY AS DIRECTED 100 each 3  . Probiotic Product (PROBIOTIC DAILY PO) Take 2 capsules by mouth daily.     [name] trimethoprim (TRIMPEX) 100 MG tablet TAKE 1 TABLET(100 MG) BY MOUTH DAILY (Patient taking differently: TAKE 1 TABLET(100 MG) BY MOUTH AT BEDTIME) 90 tablet 1  . amiodarone (PACERONE) 200 MG tablet Take 1 tablet (200 mg total) by mouth 2 (two) times daily. (Patient not taking: Reported on 04/18/2017) 30 tablet 0  . rivaroxaban (XARELTO) 20 MG TABS tablet Take 1 tablet (20 mg total) by mouth daily with supper. Restart day after catheterization if no bleeding or swelling at catheterization site. (Patient not taking: Reported on 04/22/2017) 30 tablet 11   No current facility-administered medications for this visit.  Physical Exam:   BP 140/70   Pulse 90   Resp 20   Ht 5' 6.5" (1.689 m)   Wt 130 lb (59 kg)   SpO2 99% Comment: RA  BMI 20.67 kg/m   General:  Well-appearing  Chest:   Clear to auscultation  CV:   Regular rate and rhythm with late systolic murmur  Incisions:  n/a  Abdomen:  Soft nontender  Extremities:  Warm and well-perfused  Diagnostic Tests:  CT ANGIOGRAPHY CHEST, ABDOMEN AND PELVIS  TECHNIQUE: Multidetector CT imaging through the chest, abdomen and pelvis was performed using the standard protocol during bolus administration of intravenous contrast. Multiplanar reconstructed images and MIPs were obtained and reviewed to evaluate  the vascular anatomy.  CONTRAST:  75 mL Isovue 370  COMPARISON:  Prior CT scan of the lumbar spine 05/25/2014  FINDINGS: CTA CHEST FINDINGS  Cardiovascular: Conventional 3 vessel aortic arch. Mild ectasia of the ascending thoracic aorta to 3.5 cm. The aortic root is normal in caliber. The arch and descending thoracic aorta are also normal in caliber. There is mild atherosclerotic vascular calcification. Trace calcifications are also present at the left main, left anterior descending, circumflex and right coronary arteries. Marked left atrial dilatation. Thickened mitral valve. The remainder the cardiac structures are normal in size. No pericardial effusion.  Mediastinum/Nodes: Unremarkable CT appearance of the thyroid gland. No suspicious mediastinal or hilar adenopathy. No soft tissue mediastinal mass. Moderately large sliding hiatal hernia.  Lungs/Pleura: Lungs are clear. No pleural effusion or pneumothorax.  Musculoskeletal: No acute osseous abnormality. Long segment posterior thoracic fusion.  Review of the MIP images confirms the above findings.  CTA ABDOMEN AND PELVIS FINDINGS  VASCULAR  Aorta: Atherosclerotic vascular calcifications without evidence of aneurysm or dissection.  Celiac: Widely patent.  No aneurysm or dissection.  SMA: Widely patent.  No aneurysm or dissection.  Renals: Single right renal artery. Mild atherosclerotic plaque at the origin with perhaps mild stenosis. Dominant left renal artery is also widely patent. There is a small accessory artery to the right lower pole. No changes of fibromuscular dysplasia.  IMA: Patent with normal antegrade flow.  Inflow: Minimal heterogeneous atherosclerotic plaque in the proximal common iliac arteries. No significant stenosis. The internal iliac arteries are patent. The external iliac arteries are spared from any disease.  Veins: No focal venous abnormality.  Review of the MIP images  confirms the above findings.  NON-VASCULAR  Hepatobiliary: Low-attenuation lesion with peripheral incomplete nodular enhancement in the hepatic dome measures 4.3 x 3.3 cm and is consistent with a benign hemangioma. There is a second similar-appearing lesion in the posterior aspect of hepatic segment 7 which measures up to 5 cm in diameter. A third smaller lesion is also present in hepatic segment 7 and measures only 1.4 cm. Another similar lesion in the left hepatic lobe measures up to 1.4 cm. Finally, there is a circumscribed nonenhancing cystic lesion in hepatic segment 3 which measures 2.4 cm in diameter. No evidence of biliary ductal dilatation. Cholelithiasis.  Pancreas: Unremarkable. No pancreatic ductal dilatation or surrounding inflammatory changes.  Spleen: Normal in size without focal abnormality.  Adrenals/Urinary Tract: Normal adrenal glands. No evidence of hydronephrosis or enhancing renal mass. Nonobstructing 3- 4 mm stone in the lower pole of the left kidney. Adjacent punctate 1 mm stone also in the lower pole of the left kidney. The ureters are unremarkable.  Stomach/Bowel: No evidence of bowel obstruction or focal bowel wall thickening. Colonic diverticular disease without CT evidence of active inflammation.  Lymphatic: No suspicious lymphadenopathy.  Reproductive: Status post hysterectomy. No adnexal masses.  Other: Small fat containing umbilical hernia. No abdominopelvic ascites.  Musculoskeletal: No acute osseous abnormality. Extensive multi level thoraco lumbar fusion extending from T7 through S1.  Review of the MIP images confirms the above findings.  IMPRESSION: CTA CHEST  1. No evidence of acute aortic abnormality. 2. Mild ectasia of the ascending thoracic aorta with a maximal diameter of 3.5 cm. 3. Coronary and aortic atherosclerotic calcifications. Aortic Atherosclerosis (ICD10-170.0) 4. Thickened mitral valve with prominent  dilatation of the left atrium consistent with the clinical history of mitral valve disease. 5. Moderately large sliding hiatal hernia.  CTA ABD/PELVIS  1. No evidence of abdominal aortic aneurysm or acute aortic abnormality. 2. Scattered atherosclerotic vascular calcifications. 3. Multiple hepatic lesions the appearance of which are most consistent with a combination of a combination of benign hemangiomas and a simple cyst. 4. Cholelithiasis. 5. Colonic diverticular disease without CT evidence of active inflammation. 6. Nonobstructing left lower pole nephrolithiasis. 7. Small fat containing umbilical hernia. 8. Extensive multilevel thoracolumbar posterior fusion extending from T7 through S1.  Signed,  Criselda Peaches, MD  [facility]  [facility]   Electronically Signed   By: Jacqulynn Cadet M.D.   On: 04/15/2017 11:27    Impression:  Patient has mitral valve prolapse with stage D severe symptomatic primary mitral regurgitation and multivessel coronary artery disease.  She presents with a several month history of symptoms of exertional shortness of breath, palpitations, and lower extremity edema consistent with chronic diastolic congestive heart failure, New [place] Heart Association functional class II. She also has been having exertional chest pressure consistent with stable angina pectoris.  I have personally reviewed the patient's recent transthoracic and transesophageal echocardiograms and diagnostic cardiac catheterization. Echocardiograms revealed the presence of bileaflet prolapse and mitral valve. Both leaflets are somewhat thickened and there is severe prolapse involving the posterior leaflet. However, there are no obvious ruptured chordae tendineae nor are there any flail segments. There are multiple jets of regurgitation. The leaflets of the valve are somewhat thickened. There does not appear to be significant  calcification. The severity of mitral regurgitation actually looked a bit worse on transthoracic echo than it did on transesophageal echo. Left ventricular systolic function appears normal. Diagnostic cardiac catheterization reveals multivessel coronary artery disease with 100% occlusion of the mid right coronary artery, left to right collateral filling of the terminal branches of the right coronary artery, and nonobstructive disease in the left circumflex system.  Options at this time include continued medical therapy versus surgical intervention. Although the patient is somewhat elderly, the risks associated with surgery should be relatively low.  Plan:  The patient and her husband were again counseled at length regarding the indications, risks and potential benefits of mitral valve repair and coronary artery bypass grafting.  The rationale for elective surgery has been explained, including a comparison between surgery and continued medical therapy with close follow-up.   expectations for her postoperative convalescence at been discussed. The likelihood of successful and durable valve repair has been discussed with particular reference to the findings of their recent echocardiogram.  Based upon these findings and previous experience, I have quoted them a greater than 50 percent likelihood of successful valve repair.  In the unlikely event that their valve cannot be successfully repaired, we discussed the possibility of replacing the mitral valve using a mechanical prosthesis with the attendant need for long-term anticoagulation versus the alternative of  replacing it using a bioprosthetic tissue valve with its potential for late structural valve deterioration and failure, depending upon the patient's longevity.  The patient specifically requests that if the mitral valve must be replaced that it be done using a bioprosthetic tissue valve.   The patient understands and accepts all potential risks of surgery  including but not limited to risk of death, stroke or other neurologic complication, myocardial infarction, congestive heart failure, respiratory failure, renal failure, bleeding requiring transfusion and/or reexploration, arrhythmia, infection or other wound complications, pneumonia, pleural and/or pericardial effusion, pulmonary embolus, aortic dissection or other major vascular complication, or delayed complications related to valve repair or replacement including but not limited to structural valve deterioration and failure, thrombosis, embolization, endocarditis, paravalvular leak, or late recurrence of symptomatic coronary artery disease.  All of their questions have been answered.  I spent in excess of 15 minutes during the conduct of this office consultation and >50% of this time involved direct face-to-face encounter with the patient for counseling and/or coordination of their care.    Valentina Gu. Roxy Manns, MD 04/22/2017 10:37 AM

## 2017-04-23 ENCOUNTER — Ambulatory Visit (HOSPITAL_COMMUNITY): Payer: Medicare Other

## 2017-04-23 ENCOUNTER — Inpatient Hospital Stay (HOSPITAL_COMMUNITY): Payer: Medicare Other | Admitting: Anesthesiology

## 2017-04-23 ENCOUNTER — Encounter (HOSPITAL_COMMUNITY): Payer: Self-pay | Admitting: *Deleted

## 2017-04-23 ENCOUNTER — Encounter (HOSPITAL_COMMUNITY)
Admission: RE | Disposition: A | Discharge status: Home Health | Payer: Self-pay | Source: Ambulatory Visit | Attending: Thoracic Surgery (Cardiothoracic Vascular Surgery)

## 2017-04-23 ENCOUNTER — Inpatient Hospital Stay (HOSPITAL_COMMUNITY): Payer: Medicare Other

## 2017-04-23 ENCOUNTER — Inpatient Hospital Stay (HOSPITAL_COMMUNITY)
Admission: RE | Admit: 2017-04-23 | Discharge: 2017-05-08 | DRG: 219 | Disposition: A | Discharge status: Home Health | Payer: Medicare Other | Source: Ambulatory Visit | Attending: Thoracic Surgery (Cardiothoracic Vascular Surgery) | Admitting: Thoracic Surgery (Cardiothoracic Vascular Surgery)

## 2017-04-23 DIAGNOSIS — I5043 Acute on chronic combined systolic (congestive) and diastolic (congestive) heart failure: Secondary | ICD-10-CM | POA: Diagnosis present

## 2017-04-23 DIAGNOSIS — E871 Hypo-osmolality and hyponatremia: Secondary | ICD-10-CM | POA: Diagnosis not present

## 2017-04-23 DIAGNOSIS — E785 Hyperlipidemia, unspecified: Secondary | ICD-10-CM | POA: Diagnosis not present

## 2017-04-23 DIAGNOSIS — R7611 Nonspecific reaction to tuberculin skin test without active tuberculosis: Secondary | ICD-10-CM | POA: Diagnosis present

## 2017-04-23 DIAGNOSIS — Z953 Presence of xenogenic heart valve: Secondary | ICD-10-CM

## 2017-04-23 DIAGNOSIS — Z9841 Cataract extraction status, right eye: Secondary | ICD-10-CM | POA: Diagnosis not present

## 2017-04-23 DIAGNOSIS — I110 Hypertensive heart disease with heart failure: Secondary | ICD-10-CM | POA: Diagnosis not present

## 2017-04-23 DIAGNOSIS — E11649 Type 2 diabetes mellitus with hypoglycemia without coma: Secondary | ICD-10-CM | POA: Diagnosis not present

## 2017-04-23 DIAGNOSIS — E876 Hypokalemia: Secondary | ICD-10-CM | POA: Diagnosis not present

## 2017-04-23 DIAGNOSIS — D62 Acute posthemorrhagic anemia: Secondary | ICD-10-CM | POA: Diagnosis not present

## 2017-04-23 DIAGNOSIS — I059 Rheumatic mitral valve disease, unspecified: Secondary | ICD-10-CM | POA: Diagnosis not present

## 2017-04-23 DIAGNOSIS — D6959 Other secondary thrombocytopenia: Secondary | ICD-10-CM | POA: Diagnosis not present

## 2017-04-23 DIAGNOSIS — I10 Essential (primary) hypertension: Secondary | ICD-10-CM | POA: Diagnosis present

## 2017-04-23 DIAGNOSIS — K219 Gastro-esophageal reflux disease without esophagitis: Secondary | ICD-10-CM | POA: Diagnosis present

## 2017-04-23 DIAGNOSIS — Z7984 Long term (current) use of oral hypoglycemic drugs: Secondary | ICD-10-CM

## 2017-04-23 DIAGNOSIS — F329 Major depressive disorder, single episode, unspecified: Secondary | ICD-10-CM | POA: Diagnosis present

## 2017-04-23 DIAGNOSIS — Z9842 Cataract extraction status, left eye: Secondary | ICD-10-CM

## 2017-04-23 DIAGNOSIS — I509 Heart failure, unspecified: Secondary | ICD-10-CM

## 2017-04-23 DIAGNOSIS — Z885 Allergy status to narcotic agent status: Secondary | ICD-10-CM

## 2017-04-23 DIAGNOSIS — I5082 Biventricular heart failure: Secondary | ICD-10-CM | POA: Diagnosis present

## 2017-04-23 DIAGNOSIS — I341 Nonrheumatic mitral (valve) prolapse: Secondary | ICD-10-CM | POA: Diagnosis present

## 2017-04-23 DIAGNOSIS — M1990 Unspecified osteoarthritis, unspecified site: Secondary | ICD-10-CM | POA: Diagnosis not present

## 2017-04-23 DIAGNOSIS — I700 Atherosclerosis of aorta: Secondary | ICD-10-CM | POA: Diagnosis not present

## 2017-04-23 DIAGNOSIS — K761 Chronic passive congestion of liver: Secondary | ICD-10-CM | POA: Diagnosis not present

## 2017-04-23 DIAGNOSIS — Z7901 Long term (current) use of anticoagulants: Secondary | ICD-10-CM

## 2017-04-23 DIAGNOSIS — D509 Iron deficiency anemia, unspecified: Secondary | ICD-10-CM | POA: Diagnosis present

## 2017-04-23 DIAGNOSIS — Z9689 Presence of other specified functional implants: Secondary | ICD-10-CM

## 2017-04-23 DIAGNOSIS — H9190 Unspecified hearing loss, unspecified ear: Secondary | ICD-10-CM | POA: Diagnosis not present

## 2017-04-23 DIAGNOSIS — Z888 Allergy status to other drugs, medicaments and biological substances status: Secondary | ICD-10-CM

## 2017-04-23 DIAGNOSIS — F419 Anxiety disorder, unspecified: Secondary | ICD-10-CM | POA: Diagnosis present

## 2017-04-23 DIAGNOSIS — I340 Nonrheumatic mitral (valve) insufficiency: Secondary | ICD-10-CM

## 2017-04-23 DIAGNOSIS — I2510 Atherosclerotic heart disease of native coronary artery without angina pectoris: Secondary | ICD-10-CM

## 2017-04-23 DIAGNOSIS — R0602 Shortness of breath: Secondary | ICD-10-CM | POA: Diagnosis present

## 2017-04-23 DIAGNOSIS — I083 Combined rheumatic disorders of mitral, aortic and tricuspid valves: Secondary | ICD-10-CM | POA: Diagnosis not present

## 2017-04-23 DIAGNOSIS — J90 Pleural effusion, not elsewhere classified: Secondary | ICD-10-CM | POA: Diagnosis not present

## 2017-04-23 DIAGNOSIS — I517 Cardiomegaly: Secondary | ICD-10-CM | POA: Diagnosis not present

## 2017-04-23 DIAGNOSIS — Z981 Arthrodesis status: Secondary | ICD-10-CM

## 2017-04-23 DIAGNOSIS — Z4682 Encounter for fitting and adjustment of non-vascular catheter: Secondary | ICD-10-CM | POA: Diagnosis not present

## 2017-04-23 DIAGNOSIS — I480 Paroxysmal atrial fibrillation: Secondary | ICD-10-CM | POA: Diagnosis not present

## 2017-04-23 DIAGNOSIS — J9811 Atelectasis: Secondary | ICD-10-CM | POA: Diagnosis not present

## 2017-04-23 DIAGNOSIS — I4891 Unspecified atrial fibrillation: Secondary | ICD-10-CM | POA: Diagnosis present

## 2017-04-23 DIAGNOSIS — K7200 Acute and subacute hepatic failure without coma: Secondary | ICD-10-CM | POA: Diagnosis not present

## 2017-04-23 DIAGNOSIS — Z951 Presence of aortocoronary bypass graft: Secondary | ICD-10-CM

## 2017-04-23 DIAGNOSIS — R931 Abnormal findings on diagnostic imaging of heart and coronary circulation: Secondary | ICD-10-CM | POA: Diagnosis not present

## 2017-04-23 DIAGNOSIS — E1169 Type 2 diabetes mellitus with other specified complication: Secondary | ICD-10-CM

## 2017-04-23 DIAGNOSIS — Z79899 Other long term (current) drug therapy: Secondary | ICD-10-CM

## 2017-04-23 DIAGNOSIS — I080 Rheumatic disorders of both mitral and aortic valves: Secondary | ICD-10-CM | POA: Diagnosis not present

## 2017-04-23 DIAGNOSIS — R791 Abnormal coagulation profile: Secondary | ICD-10-CM | POA: Diagnosis not present

## 2017-04-23 DIAGNOSIS — R079 Chest pain, unspecified: Secondary | ICD-10-CM | POA: Diagnosis not present

## 2017-04-23 DIAGNOSIS — T462X5A Adverse effect of other antidysrhythmic drugs, initial encounter: Secondary | ICD-10-CM | POA: Diagnosis not present

## 2017-04-23 DIAGNOSIS — E1122 Type 2 diabetes mellitus with diabetic chronic kidney disease: Secondary | ICD-10-CM

## 2017-04-23 DIAGNOSIS — I361 Nonrheumatic tricuspid (valve) insufficiency: Secondary | ICD-10-CM | POA: Diagnosis not present

## 2017-04-23 DIAGNOSIS — D649 Anemia, unspecified: Secondary | ICD-10-CM | POA: Diagnosis present

## 2017-04-23 DIAGNOSIS — Z882 Allergy status to sulfonamides status: Secondary | ICD-10-CM

## 2017-04-23 DIAGNOSIS — I5021 Acute systolic (congestive) heart failure: Secondary | ICD-10-CM | POA: Diagnosis not present

## 2017-04-23 HISTORY — DX: Presence of aortocoronary bypass graft: Z95.1

## 2017-04-23 HISTORY — PX: MITRAL VALVE REPLACEMENT: SHX147

## 2017-04-23 HISTORY — PX: CORONARY ARTERY BYPASS GRAFT: SHX141

## 2017-04-23 HISTORY — DX: Presence of xenogenic heart valve: Z95.3

## 2017-04-23 HISTORY — PX: CLIPPING OF ATRIAL APPENDAGE: SHX5773

## 2017-04-23 HISTORY — PX: TEE WITHOUT CARDIOVERSION: SHX5443

## 2017-04-23 LAB — POCT I-STAT 3, ART BLOOD GAS (G3+)
ACID-BASE DEFICIT: 4 mmol/L — AB (ref 0.0–2.0)
ACID-BASE DEFICIT: 5 mmol/L — AB (ref 0.0–2.0)
ACID-BASE DEFICIT: 7 mmol/L — AB (ref 0.0–2.0)
ACID-BASE DEFICIT: 5 mmol/L — AB (ref 0.0–2.0)
ACID-BASE EXCESS: 5 mmol/L — AB (ref 0.0–2.0)
BICARBONATE: 21.9 mmol/L (ref 20.0–28.0)
BICARBONATE: 21.2 mmol/L (ref 20.0–28.0)
Bicarbonate: 29.1 mmol/L — ABNORMAL HIGH (ref 20.0–28.0)
Bicarbonate: 26.1 mmol/L (ref 20.0–28.0)
Bicarbonate: 19.2 mmol/L — ABNORMAL LOW (ref 20.0–28.0)
Bicarbonate: 22.7 mmol/L (ref 20.0–28.0)
O2 SAT: 100 %
O2 SAT: 99 %
O2 SAT: 99 %
O2 SAT: 98 %
O2 SAT: 94 %
O2 Saturation: 100 %
PCO2 ART: 37.8 mmHg (ref 32.0–48.0)
PCO2 ART: 40.8 mmHg (ref 32.0–48.0)
PCO2 ART: 39.6 mmHg (ref 32.0–48.0)
PCO2 ART: 50.5 mmHg — AB (ref 32.0–48.0)
PH ART: 7.494 — AB (ref 7.350–7.450)
PH ART: 7.326 — AB (ref 7.350–7.450)
PH ART: 7.259 — AB (ref 7.350–7.450)
PO2 ART: 387 mmHg — AB (ref 83.0–108.0)
PO2 ART: 286 mmHg — AB (ref 83.0–108.0)
PO2 ART: 129 mmHg — AB (ref 83.0–108.0)
Patient temperature: 36.5
Patient temperature: 36.8
Patient temperature: 36.7
TCO2: 30 mmol/L (ref 0–100)
TCO2: 28 mmol/L (ref 0–100)
TCO2: 23 mmol/L (ref 0–100)
TCO2: 22 mmol/L (ref 0–100)
TCO2: 20 mmol/L (ref 0–100)
TCO2: 24 mmol/L (ref 0–100)
pCO2 arterial: 50.2 mmHg — ABNORMAL HIGH (ref 32.0–48.0)
pCO2 arterial: 41.3 mmHg (ref 32.0–48.0)
pH, Arterial: 7.324 — ABNORMAL LOW (ref 7.350–7.450)
pH, Arterial: 7.322 — ABNORMAL LOW (ref 7.350–7.450)
pH, Arterial: 7.291 — ABNORMAL LOW (ref 7.350–7.450)
pO2, Arterial: 138 mmHg — ABNORMAL HIGH (ref 83.0–108.0)
pO2, Arterial: 113 mmHg — ABNORMAL HIGH (ref 83.0–108.0)
pO2, Arterial: 83 mmHg (ref 83.0–108.0)

## 2017-04-23 LAB — POCT I-STAT, CHEM 8
BUN: 15 mg/dL (ref 6–20)
BUN: 14 mg/dL (ref 6–20)
BUN: 11 mg/dL (ref 6–20)
BUN: 13 mg/dL (ref 6–20)
BUN: 13 mg/dL (ref 6–20)
BUN: 14 mg/dL (ref 6–20)
BUN: 13 mg/dL (ref 6–20)
BUN: 12 mg/dL (ref 6–20)
CALCIUM ION: 1.18 mmol/L (ref 1.15–1.40)
CALCIUM ION: 1.03 mmol/L — AB (ref 1.15–1.40)
CALCIUM ION: 1.08 mmol/L — AB (ref 1.15–1.40)
CALCIUM ION: 1.06 mmol/L — AB (ref 1.15–1.40)
CHLORIDE: 95 mmol/L — AB (ref 101–111)
CHLORIDE: 101 mmol/L (ref 101–111)
CREATININE: 0.4 mg/dL — AB (ref 0.44–1.00)
CREATININE: 0.5 mg/dL (ref 0.44–1.00)
CREATININE: 0.4 mg/dL — AB (ref 0.44–1.00)
CREATININE: 0.4 mg/dL — AB (ref 0.44–1.00)
Calcium, Ion: 1.16 mmol/L (ref 1.15–1.40)
Calcium, Ion: 0.9 mmol/L — ABNORMAL LOW (ref 1.15–1.40)
Calcium, Ion: 1.07 mmol/L — ABNORMAL LOW (ref 1.15–1.40)
Calcium, Ion: 1.04 mmol/L — ABNORMAL LOW (ref 1.15–1.40)
Chloride: 94 mmol/L — ABNORMAL LOW (ref 101–111)
Chloride: 95 mmol/L — ABNORMAL LOW (ref 101–111)
Chloride: 91 mmol/L — ABNORMAL LOW (ref 101–111)
Chloride: 91 mmol/L — ABNORMAL LOW (ref 101–111)
Chloride: 92 mmol/L — ABNORMAL LOW (ref 101–111)
Chloride: 93 mmol/L — ABNORMAL LOW (ref 101–111)
Creatinine, Ser: 0.5 mg/dL (ref 0.44–1.00)
Creatinine, Ser: 0.3 mg/dL — ABNORMAL LOW (ref 0.44–1.00)
Creatinine, Ser: 0.4 mg/dL — ABNORMAL LOW (ref 0.44–1.00)
Creatinine, Ser: 0.4 mg/dL — ABNORMAL LOW (ref 0.44–1.00)
GLUCOSE: 138 mg/dL — AB (ref 65–99)
GLUCOSE: 127 mg/dL — AB (ref 65–99)
Glucose, Bld: 113 mg/dL — ABNORMAL HIGH (ref 65–99)
Glucose, Bld: 107 mg/dL — ABNORMAL HIGH (ref 65–99)
Glucose, Bld: 88 mg/dL (ref 65–99)
Glucose, Bld: 168 mg/dL — ABNORMAL HIGH (ref 65–99)
Glucose, Bld: 202 mg/dL — ABNORMAL HIGH (ref 65–99)
Glucose, Bld: 219 mg/dL — ABNORMAL HIGH (ref 65–99)
HCT: 22 % — ABNORMAL LOW (ref 36.0–46.0)
HCT: 31 % — ABNORMAL LOW (ref 36.0–46.0)
HEMATOCRIT: 31 % — AB (ref 36.0–46.0)
HEMATOCRIT: 28 % — AB (ref 36.0–46.0)
HEMATOCRIT: 25 % — AB (ref 36.0–46.0)
HEMATOCRIT: 26 % — AB (ref 36.0–46.0)
HEMATOCRIT: 26 % — AB (ref 36.0–46.0)
HEMATOCRIT: 23 % — AB (ref 36.0–46.0)
HEMOGLOBIN: 10.5 g/dL — AB (ref 12.0–15.0)
HEMOGLOBIN: 9.5 g/dL — AB (ref 12.0–15.0)
HEMOGLOBIN: 8.8 g/dL — AB (ref 12.0–15.0)
HEMOGLOBIN: 7.8 g/dL — AB (ref 12.0–15.0)
Hemoglobin: 7.5 g/dL — ABNORMAL LOW (ref 12.0–15.0)
Hemoglobin: 8.5 g/dL — ABNORMAL LOW (ref 12.0–15.0)
Hemoglobin: 8.8 g/dL — ABNORMAL LOW (ref 12.0–15.0)
Hemoglobin: 10.5 g/dL — ABNORMAL LOW (ref 12.0–15.0)
POTASSIUM: 4.4 mmol/L (ref 3.5–5.1)
POTASSIUM: 4.6 mmol/L (ref 3.5–5.1)
POTASSIUM: 4.4 mmol/L (ref 3.5–5.1)
Potassium: 4.7 mmol/L (ref 3.5–5.1)
Potassium: 4.5 mmol/L (ref 3.5–5.1)
Potassium: 4.5 mmol/L (ref 3.5–5.1)
Potassium: 5.5 mmol/L — ABNORMAL HIGH (ref 3.5–5.1)
Potassium: 5.1 mmol/L (ref 3.5–5.1)
SODIUM: 128 mmol/L — AB (ref 135–145)
SODIUM: 129 mmol/L — AB (ref 135–145)
SODIUM: 132 mmol/L — AB (ref 135–145)
SODIUM: 127 mmol/L — AB (ref 135–145)
SODIUM: 128 mmol/L — AB (ref 135–145)
Sodium: 128 mmol/L — ABNORMAL LOW (ref 135–145)
Sodium: 128 mmol/L — ABNORMAL LOW (ref 135–145)
Sodium: 132 mmol/L — ABNORMAL LOW (ref 135–145)
TCO2: 25 mmol/L (ref 0–100)
TCO2: 26 mmol/L (ref 0–100)
TCO2: 25 mmol/L (ref 0–100)
TCO2: 26 mmol/L (ref 0–100)
TCO2: 23 mmol/L (ref 0–100)
TCO2: 25 mmol/L (ref 0–100)
TCO2: 24 mmol/L (ref 0–100)
TCO2: 20 mmol/L (ref 0–100)

## 2017-04-23 LAB — HEMOGLOBIN AND HEMATOCRIT, BLOOD
HEMATOCRIT: 24.6 % — AB (ref 36.0–46.0)
HEMOGLOBIN: 8.3 g/dL — AB (ref 12.0–15.0)

## 2017-04-23 LAB — GLUCOSE, CAPILLARY
GLUCOSE-CAPILLARY: 167 mg/dL — AB (ref 65–99)
GLUCOSE-CAPILLARY: 88 mg/dL (ref 65–99)
GLUCOSE-CAPILLARY: 107 mg/dL — AB (ref 65–99)
GLUCOSE-CAPILLARY: 97 mg/dL (ref 65–99)
GLUCOSE-CAPILLARY: 175 mg/dL — AB (ref 65–99)
GLUCOSE-CAPILLARY: 212 mg/dL — AB (ref 65–99)
Glucose-Capillary: 102 mg/dL — ABNORMAL HIGH (ref 65–99)
Glucose-Capillary: 115 mg/dL — ABNORMAL HIGH (ref 65–99)
Glucose-Capillary: 131 mg/dL — ABNORMAL HIGH (ref 65–99)

## 2017-04-23 LAB — CREATININE, SERUM
Creatinine, Ser: 0.56 mg/dL (ref 0.44–1.00)
GFR calc Af Amer: >60 mL/min (ref >60)
GFR calc non Af Amer: >60 mL/min (ref >60)

## 2017-04-23 LAB — CBC
HEMATOCRIT: 24.9 % — AB (ref 36.0–46.0)
HEMATOCRIT: 32.1 % — AB (ref 36.0–46.0)
HEMOGLOBIN: 8.4 g/dL — AB (ref 12.0–15.0)
Hemoglobin: 11.1 g/dL — ABNORMAL LOW (ref 12.0–15.0)
MCH: 30.2 pg (ref 26.0–34.0)
MCH: 31.1 pg (ref 26.0–34.0)
MCHC: 33.7 g/dL (ref 30.0–36.0)
MCHC: 34.6 g/dL (ref 30.0–36.0)
MCV: 89.6 fL (ref 78.0–100.0)
MCV: 89.9 fL (ref 78.0–100.0)
PLATELETS: 175 10*3/uL (ref 150–400)
Platelets: 191 10*3/uL (ref 150–400)
RBC: 2.78 MIL/uL — ABNORMAL LOW (ref 3.87–5.11)
RBC: 3.57 MIL/uL — ABNORMAL LOW (ref 3.87–5.11)
RDW: 13.9 % (ref 11.5–15.5)
RDW: 14.1 % (ref 11.5–15.5)
WBC: 9.5 10*3/uL (ref 4.0–10.5)
WBC: 10.6 10*3/uL — AB (ref 4.0–10.5)

## 2017-04-23 LAB — PLATELET COUNT: PLATELETS: 235 10*3/uL (ref 150–400)

## 2017-04-23 LAB — POCT I-STAT 4, (NA,K, GLUC, HGB,HCT)
GLUCOSE: 127 mg/dL — AB (ref 65–99)
HCT: 24 % — ABNORMAL LOW (ref 36.0–46.0)
Hemoglobin: 8.2 g/dL — ABNORMAL LOW (ref 12.0–15.0)
POTASSIUM: 3.9 mmol/L (ref 3.5–5.1)
SODIUM: 132 mmol/L — AB (ref 135–145)

## 2017-04-23 LAB — APTT: APTT: 34 s (ref 24–36)

## 2017-04-23 LAB — PROTIME-INR
INR: 1.5
Prothrombin Time: 18.2 seconds — ABNORMAL HIGH (ref 11.4–15.2)

## 2017-04-23 LAB — PREPARE RBC (CROSSMATCH)

## 2017-04-23 LAB — MAGNESIUM: Magnesium: 2.9 mg/dL — ABNORMAL HIGH (ref 1.7–2.4)

## 2017-04-23 SURGERY — REPLACEMENT, MITRAL VALVE
Anesthesia: General | Site: Chest

## 2017-04-23 MED ORDER — METOPROLOL TARTRATE 5 MG/5ML IV SOLN
2.5000 mg | INTRAVENOUS | Status: DC | PRN
  Filled 2017-04-23: qty 5

## 2017-04-23 MED ORDER — DOPAMINE-DEXTROSE 3.2-5 MG/ML-% IV SOLN
3.0000 ug/kg/min | INTRAVENOUS | Status: DC
  Administered 2017-04-23 – 2017-04-26 (×2): 3 ug/kg/min via INTRAVENOUS
  Filled 2017-04-23: qty 250

## 2017-04-23 MED ORDER — PHENYLEPHRINE HCL 10 MG/ML IJ SOLN
INTRAVENOUS | Status: DC | PRN
  Administered 2017-04-23: 10 ug/min via INTRAVENOUS

## 2017-04-23 MED ORDER — SODIUM CHLORIDE 0.9 % IJ SOLN
INTRAMUSCULAR | Status: AC
  Filled 2017-04-23: qty 10

## 2017-04-23 MED ORDER — HEPARIN SODIUM (PORCINE) 1000 UNIT/ML IJ SOLN
INTRAMUSCULAR | Status: DC | PRN
  Administered 2017-04-23: 22000 [IU] via INTRAVENOUS

## 2017-04-23 MED ORDER — ALBUMIN HUMAN 5 % IV SOLN
INTRAVENOUS | Status: DC | PRN
  Administered 2017-04-23 (×2): via INTRAVENOUS

## 2017-04-23 MED ORDER — METOPROLOL TARTRATE 12.5 MG HALF TABLET
12.5000 mg | ORAL_TABLET | Freq: Two times a day (BID) | ORAL | Status: DC
  Administered 2017-04-24 – 2017-04-25 (×2): 12.5 mg via ORAL
  Filled 2017-04-23 (×2): qty 1

## 2017-04-23 MED ORDER — MORPHINE SULFATE (PF) 4 MG/ML IV SOLN
1.0000 mg | INTRAVENOUS | Status: DC | PRN
  Filled 2017-04-23: qty 1

## 2017-04-23 MED ORDER — PANTOPRAZOLE SODIUM 40 MG PO TBEC
40.0000 mg | DELAYED_RELEASE_TABLET | Freq: Every day | ORAL | Status: DC
  Administered 2017-04-25 – 2017-05-08 (×14): 40 mg via ORAL
  Filled 2017-04-23 (×14): qty 1

## 2017-04-23 MED ORDER — PHENYLEPHRINE HCL 10 MG/ML IJ SOLN
INTRAVENOUS | Status: DC | PRN
  Administered 2017-04-23: 20 ug/min via INTRAVENOUS

## 2017-04-23 MED ORDER — SODIUM CHLORIDE 0.9 % IJ SOLN
OROMUCOSAL | Status: DC | PRN
  Administered 2017-04-23 (×3): 4 mL via TOPICAL

## 2017-04-23 MED ORDER — VASOPRESSIN 20 UNIT/ML IV SOLN
INTRAVENOUS | Status: DC | PRN
  Administered 2017-04-23: 1 [IU] via INTRAVENOUS

## 2017-04-23 MED ORDER — ARTIFICIAL TEARS OPHTHALMIC OINT
TOPICAL_OINTMENT | OPHTHALMIC | Status: DC | PRN
  Administered 2017-04-23: 1 via OPHTHALMIC

## 2017-04-23 MED ORDER — DOCUSATE SODIUM 100 MG PO CAPS
200.0000 mg | ORAL_CAPSULE | Freq: Every day | ORAL | Status: DC
Start: 2017-04-24 — End: 2017-05-08
  Administered 2017-04-24 – 2017-05-01 (×8): 200 mg via ORAL
  Filled 2017-04-23 (×11): qty 2

## 2017-04-23 MED ORDER — METOPROLOL TARTRATE 25 MG/10 ML ORAL SUSPENSION: 12.5000 mg | Freq: Two times a day (BID) | ORAL | Status: DC

## 2017-04-23 MED ORDER — BISACODYL 5 MG PO TBEC
10.0000 mg | DELAYED_RELEASE_TABLET | Freq: Every day | ORAL | Status: DC
  Administered 2017-04-24 – 2017-05-01 (×7): 10 mg via ORAL
  Filled 2017-04-23 (×11): qty 2

## 2017-04-23 MED ORDER — LACTATED RINGERS IV SOLN
INTRAVENOUS | Status: DC
  Administered 2017-04-24 – 2017-04-28 (×3): via INTRAVENOUS

## 2017-04-23 MED ORDER — ONDANSETRON HCL 4 MG/2ML IJ SOLN
4.0000 mg | Freq: Four times a day (QID) | INTRAMUSCULAR | Status: DC | PRN
  Administered 2017-04-23 – 2017-04-25 (×2): 4 mg via INTRAVENOUS
  Filled 2017-04-23 (×4): qty 2

## 2017-04-23 MED ORDER — VANCOMYCIN HCL 1000 MG IV SOLR
INTRAVENOUS | Status: DC | PRN
  Administered 2017-04-23: 1250 mg via INTRAVENOUS

## 2017-04-23 MED ORDER — SODIUM CHLORIDE 0.9 % IV SOLN
INTRAVENOUS | Status: DC | PRN
  Administered 2017-04-23: 12:00:00 via INTRAVENOUS

## 2017-04-23 MED ORDER — INSULIN REGULAR HUMAN 100 UNIT/ML IJ SOLN
INTRAMUSCULAR | Status: DC
  Administered 2017-04-23: 0.4 [IU]/h via INTRAVENOUS
  Filled 2017-04-23: qty 1

## 2017-04-23 MED ORDER — EPHEDRINE SULFATE 50 MG/ML IJ SOLN
INTRAMUSCULAR | Status: DC | PRN
  Administered 2017-04-23: 5 mg via INTRAVENOUS

## 2017-04-23 MED ORDER — LACTATED RINGERS IV SOLN
INTRAVENOUS | Status: DC | PRN
  Administered 2017-04-23: 07:00:00 via INTRAVENOUS

## 2017-04-23 MED ORDER — ASPIRIN 81 MG PO CHEW: 324.0000 mg | CHEWABLE_TABLET | Freq: Every day | ORAL | Status: DC

## 2017-04-23 MED ORDER — PROPOFOL 10 MG/ML IV BOLUS
INTRAVENOUS | Status: AC
  Filled 2017-04-23: qty 20

## 2017-04-23 MED ORDER — LACTATED RINGERS IV SOLN: 500.0000 mL | Freq: Once | INTRAVENOUS | Status: DC | PRN

## 2017-04-23 MED ORDER — FENTANYL CITRATE (PF) 250 MCG/5ML IJ SOLN
INTRAMUSCULAR | Status: AC
  Filled 2017-04-23: qty 5

## 2017-04-23 MED ORDER — FENTANYL CITRATE (PF) 250 MCG/5ML IJ SOLN
INTRAMUSCULAR | Status: AC
  Filled 2017-04-23: qty 25

## 2017-04-23 MED ORDER — ARTIFICIAL TEARS OPHTHALMIC OINT
TOPICAL_OINTMENT | OPHTHALMIC | Status: AC
  Filled 2017-04-23: qty 3.5

## 2017-04-23 MED ORDER — SODIUM CHLORIDE 0.9 % IV SOLN
Freq: Once | INTRAVENOUS | Status: AC
  Administered 2017-04-23: 15:00:00 via INTRAVENOUS

## 2017-04-23 MED ORDER — ROCURONIUM BROMIDE 10 MG/ML (PF) SYRINGE
PREFILLED_SYRINGE | INTRAVENOUS | Status: AC
  Filled 2017-04-23: qty 5

## 2017-04-23 MED ORDER — VANCOMYCIN HCL IN DEXTROSE 1-5 GM/200ML-% IV SOLN
1000.0000 mg | Freq: Once | INTRAVENOUS | Status: AC
  Administered 2017-04-23: 1000 mg via INTRAVENOUS
  Filled 2017-04-23: qty 200

## 2017-04-23 MED ORDER — ASPIRIN EC 325 MG PO TBEC
325.0000 mg | DELAYED_RELEASE_TABLET | Freq: Every day | ORAL | Status: DC
  Administered 2017-04-24 – 2017-04-25 (×2): 325 mg via ORAL
  Filled 2017-04-23 (×2): qty 1

## 2017-04-23 MED ORDER — EPHEDRINE 5 MG/ML INJ
INTRAVENOUS | Status: AC
  Filled 2017-04-23: qty 10

## 2017-04-23 MED ORDER — HEPARIN SODIUM (PORCINE) 1000 UNIT/ML IJ SOLN
INTRAMUSCULAR | Status: AC
  Filled 2017-04-23: qty 1

## 2017-04-23 MED ORDER — BISACODYL 10 MG RE SUPP
10.0000 mg | Freq: Every day | RECTAL | Status: DC
  Administered 2017-04-27: 10 mg via RECTAL

## 2017-04-23 MED ORDER — SODIUM CHLORIDE 0.9% FLUSH: 3.0000 mL | INTRAVENOUS | Status: DC | PRN

## 2017-04-23 MED ORDER — PHENYLEPHRINE 40 MCG/ML (10ML) SYRINGE FOR IV PUSH (FOR BLOOD PRESSURE SUPPORT)
PREFILLED_SYRINGE | INTRAVENOUS | Status: AC
  Filled 2017-04-23: qty 10

## 2017-04-23 MED ORDER — FENTANYL CITRATE (PF) 100 MCG/2ML IJ SOLN
INTRAMUSCULAR | Status: DC | PRN
  Administered 2017-04-23 (×2): 50 ug via INTRAVENOUS
  Administered 2017-04-23: 250 ug via INTRAVENOUS
  Administered 2017-04-23: 100 ug via INTRAVENOUS
  Administered 2017-04-23 (×3): 150 ug via INTRAVENOUS
  Administered 2017-04-23: 50 ug via INTRAVENOUS
  Administered 2017-04-23 (×2): 100 ug via INTRAVENOUS
  Administered 2017-04-23: 50 ug via INTRAVENOUS
  Administered 2017-04-23: 100 ug via INTRAVENOUS

## 2017-04-23 MED ORDER — ORAL CARE MOUTH RINSE
15.0000 mL | Freq: Four times a day (QID) | OROMUCOSAL | Status: DC
  Administered 2017-04-24: 15 mL via OROMUCOSAL

## 2017-04-23 MED ORDER — SODIUM CHLORIDE 0.9 % IV SOLN
INTRAVENOUS | Status: DC | PRN
  Administered 2017-04-23: 1.1 [IU]/h via INTRAVENOUS

## 2017-04-23 MED ORDER — ALBUMIN HUMAN 5 % IV SOLN
250.0000 mL | INTRAVENOUS | Status: AC | PRN
  Administered 2017-04-23 (×2): 250 mL via INTRAVENOUS

## 2017-04-23 MED ORDER — DEXTROSE 5 % IV SOLN
1.5000 g | Freq: Two times a day (BID) | INTRAVENOUS | Status: AC
  Administered 2017-04-23 – 2017-04-25 (×4): 1.5 g via INTRAVENOUS
  Filled 2017-04-23 (×4): qty 1.5

## 2017-04-23 MED ORDER — PROTAMINE SULFATE 10 MG/ML IV SOLN
INTRAVENOUS | Status: AC
  Filled 2017-04-23: qty 25

## 2017-04-23 MED ORDER — 0.9 % SODIUM CHLORIDE (POUR BTL) OPTIME
TOPICAL | Status: DC | PRN
  Administered 2017-04-23: 5000 mL

## 2017-04-23 MED ORDER — SUCCINYLCHOLINE CHLORIDE 200 MG/10ML IV SOSY
PREFILLED_SYRINGE | INTRAVENOUS | Status: AC
  Filled 2017-04-23: qty 10

## 2017-04-23 MED ORDER — MAGNESIUM SULFATE 4 GM/100ML IV SOLN
4.0000 g | Freq: Once | INTRAVENOUS | Status: AC
  Administered 2017-04-23: 4 g via INTRAVENOUS
  Filled 2017-04-23: qty 100

## 2017-04-23 MED ORDER — SODIUM CHLORIDE 0.9% FLUSH
3.0000 mL | Freq: Two times a day (BID) | INTRAVENOUS | Status: DC
  Administered 2017-04-27: 3 mL via INTRAVENOUS

## 2017-04-23 MED ORDER — CHLORHEXIDINE GLUCONATE 0.12 % MT SOLN
15.0000 mL | OROMUCOSAL | Status: AC
  Administered 2017-04-23: 15 mL via OROMUCOSAL

## 2017-04-23 MED ORDER — PROPOFOL 10 MG/ML IV BOLUS
INTRAVENOUS | Status: DC | PRN
  Administered 2017-04-23: 90 mg via INTRAVENOUS
  Administered 2017-04-23: 20 mg via INTRAVENOUS

## 2017-04-23 MED ORDER — ACETAMINOPHEN 160 MG/5ML PO SOLN: 1000.0000 mg | Freq: Four times a day (QID) | ORAL | Status: DC

## 2017-04-23 MED ORDER — TRAMADOL HCL 50 MG PO TABS
50.0000 mg | ORAL_TABLET | ORAL | Status: DC | PRN
  Administered 2017-04-24 (×2): 100 mg via ORAL
  Administered 2017-04-24: 50 mg via ORAL
  Administered 2017-04-24 – 2017-04-25 (×3): 100 mg via ORAL
  Filled 2017-04-23 (×2): qty 2
  Filled 2017-04-23: qty 1
  Filled 2017-04-23 (×4): qty 2

## 2017-04-23 MED ORDER — MORPHINE SULFATE (PF) 4 MG/ML IV SOLN
1.0000 mg | INTRAVENOUS | Status: DC | PRN
Start: 2017-04-23 — End: 2017-04-23

## 2017-04-23 MED ORDER — ROCURONIUM BROMIDE 100 MG/10ML IV SOLN
INTRAVENOUS | Status: DC | PRN
  Administered 2017-04-23: 30 mg via INTRAVENOUS
  Administered 2017-04-23 (×3): 50 mg via INTRAVENOUS
  Administered 2017-04-23: 20 mg via INTRAVENOUS

## 2017-04-23 MED ORDER — MIDAZOLAM HCL 2 MG/2ML IJ SOLN: 2.0000 mg | INTRAMUSCULAR | Status: DC | PRN

## 2017-04-23 MED ORDER — INSULIN REGULAR BOLUS VIA INFUSION
0.0000 [IU] | Freq: Three times a day (TID) | INTRAVENOUS | Status: DC
  Filled 2017-04-23: qty 10

## 2017-04-23 MED ORDER — SODIUM CHLORIDE 0.9 % IV SOLN
INTRAVENOUS | Status: DC
  Administered 2017-04-23: 14:00:00 via INTRAVENOUS

## 2017-04-23 MED ORDER — MORPHINE SULFATE (PF) 4 MG/ML IV SOLN
1.0000 mg | INTRAVENOUS | Status: DC | PRN
  Administered 2017-04-23: 4 mg via INTRAVENOUS
  Administered 2017-04-24 – 2017-04-25 (×2): 2 mg via INTRAVENOUS
  Filled 2017-04-23 (×2): qty 1

## 2017-04-23 MED ORDER — SODIUM CHLORIDE 0.9 % IV SOLN
0.0000 ug/min | INTRAVENOUS | Status: DC
  Administered 2017-04-23: 50 ug/min via INTRAVENOUS
  Filled 2017-04-23: qty 20

## 2017-04-23 MED ORDER — SODIUM CHLORIDE 0.9 % IV SOLN
250.0000 mL | INTRAVENOUS | Status: DC
  Administered 2017-04-24: 250 mL via INTRAVENOUS

## 2017-04-23 MED ORDER — LACTATED RINGERS IV SOLN: INTRAVENOUS | Status: DC

## 2017-04-23 MED ORDER — MIDAZOLAM HCL 5 MG/5ML IJ SOLN
INTRAMUSCULAR | Status: DC | PRN
  Administered 2017-04-23 (×2): 2 mg via INTRAVENOUS
  Administered 2017-04-23 (×3): 1 mg via INTRAVENOUS

## 2017-04-23 MED ORDER — VASOPRESSIN 20 UNIT/ML IV SOLN
INTRAVENOUS | Status: AC
  Filled 2017-04-23: qty 1

## 2017-04-23 MED ORDER — NITROGLYCERIN IN D5W 200-5 MCG/ML-% IV SOLN: 0.0000 ug/min | INTRAVENOUS | Status: DC

## 2017-04-23 MED ORDER — PROTAMINE SULFATE 10 MG/ML IV SOLN
INTRAVENOUS | Status: DC | PRN
  Administered 2017-04-23: 220 mg via INTRAVENOUS

## 2017-04-23 MED ORDER — ACETAMINOPHEN 160 MG/5ML PO SOLN: 650.0000 mg | Freq: Once | ORAL | Status: AC

## 2017-04-23 MED ORDER — MIDAZOLAM HCL 10 MG/2ML IJ SOLN
INTRAMUSCULAR | Status: AC
  Filled 2017-04-23: qty 2

## 2017-04-23 MED ORDER — SODIUM CHLORIDE 0.9 % IV SOLN: INTRAVENOUS | Status: DC

## 2017-04-23 MED ORDER — SODIUM BICARBONATE 8.4 % IV SOLN
50.0000 meq | Freq: Once | INTRAVENOUS | Status: AC
  Administered 2017-04-23: 50 meq via INTRAVENOUS

## 2017-04-23 MED ORDER — LIDOCAINE 2% (20 MG/ML) 5 ML SYRINGE
INTRAMUSCULAR | Status: AC
  Filled 2017-04-23: qty 5

## 2017-04-23 MED ORDER — SODIUM CHLORIDE 0.9 % IV SOLN
0.0000 ug/kg/h | INTRAVENOUS | Status: DC
  Filled 2017-04-23: qty 2

## 2017-04-23 MED ORDER — FAMOTIDINE IN NACL 20-0.9 MG/50ML-% IV SOLN
20.0000 mg | Freq: Two times a day (BID) | INTRAVENOUS | Status: DC
  Administered 2017-04-23: 20 mg via INTRAVENOUS

## 2017-04-23 MED ORDER — ACETAMINOPHEN 500 MG PO TABS
1000.0000 mg | ORAL_TABLET | Freq: Four times a day (QID) | ORAL | Status: DC
  Administered 2017-04-24 – 2017-04-28 (×15): 1000 mg via ORAL
  Filled 2017-04-23 (×15): qty 2

## 2017-04-23 MED ORDER — ACETAMINOPHEN 650 MG RE SUPP
650.0000 mg | Freq: Once | RECTAL | Status: AC
  Administered 2017-04-23: 650 mg via RECTAL

## 2017-04-23 MED ORDER — POTASSIUM CHLORIDE 10 MEQ/50ML IV SOLN
10.0000 meq | INTRAVENOUS | Status: AC
  Administered 2017-04-23 (×3): 10 meq via INTRAVENOUS

## 2017-04-23 MED ORDER — SODIUM CHLORIDE 0.45 % IV SOLN
INTRAVENOUS | Status: DC | PRN
  Administered 2017-04-23: 20 mL/h via INTRAVENOUS

## 2017-04-23 MED ORDER — OXYCODONE HCL 5 MG PO TABS
5.0000 mg | ORAL_TABLET | ORAL | Status: DC | PRN
  Administered 2017-04-24: 5 mg via ORAL
  Administered 2017-04-24: 10 mg via ORAL
  Administered 2017-04-24: 5 mg via ORAL
  Administered 2017-04-24 – 2017-04-25 (×3): 10 mg via ORAL
  Filled 2017-04-23: qty 2
  Filled 2017-04-23 (×2): qty 1
  Filled 2017-04-23 (×3): qty 2

## 2017-04-23 MED FILL — Heparin Sodium (Porcine) Inj 1000 Unit/ML: INTRAMUSCULAR | Qty: 30 | Status: AC

## 2017-04-23 MED FILL — Potassium Chloride Inj 2 mEq/ML: INTRAVENOUS | Qty: 40 | Status: AC

## 2017-04-23 MED FILL — Magnesium Sulfate Inj 50%: INTRAMUSCULAR | Qty: 10 | Status: AC

## 2017-04-23 SURGICAL SUPPLY — 159 items
ADAPTER CARDIO PERF ANTE/RETRO (ADAPTER) ×4 IMPLANT
APPLICATOR COTTON TIP 6IN STRL (MISCELLANEOUS) IMPLANT
ARTICLIP LAA PROCLIP II 40 (Clip) ×4 IMPLANT
ATTRACTOMAT 16X20 MAGNETIC DRP (DRAPES) IMPLANT
BAG DECANTER FOR FLEXI CONT (MISCELLANEOUS) ×8 IMPLANT
BANDAGE ACE 4X5 VEL STRL LF (GAUZE/BANDAGES/DRESSINGS) ×4 IMPLANT
BANDAGE ACE 6X5 VEL STRL LF (GAUZE/BANDAGES/DRESSINGS) ×4 IMPLANT
BASKET HEART (ORDER IN 25'S) (MISCELLANEOUS) ×1
BASKET HEART (ORDER IN 25S) (MISCELLANEOUS) ×3 IMPLANT
BENZOIN TINCTURE PRP APPL 2/3 (GAUZE/BANDAGES/DRESSINGS) ×4 IMPLANT
BLADE CLIPPER SURG (BLADE) ×4 IMPLANT
BLADE STERNUM SYSTEM 6 (BLADE) ×4 IMPLANT
BLADE SURG 11 STRL SS (BLADE) ×8 IMPLANT
BNDG GAUZE ELAST 4 BULKY (GAUZE/BANDAGES/DRESSINGS) ×4 IMPLANT
CANISTER SUCT 3000ML PPV (MISCELLANEOUS) ×8 IMPLANT
CANN PRFSN 3/8X14X24FR PCFC (MISCELLANEOUS)
CANN PRFSN 3/8XCNCT ST RT ANG (MISCELLANEOUS)
CANNULA EZ GLIDE AORTIC 21FR (CANNULA) ×4 IMPLANT
CANNULA FEM VENOUS REMOTE 22FR (CANNULA) ×4 IMPLANT
CANNULA GUNDRY RCSP 15FR (MISCELLANEOUS) ×4 IMPLANT
CANNULA PRFSN 3/8X14X24FR PCFC (MISCELLANEOUS) IMPLANT
CANNULA PRFSN 3/8XCNCT RT ANG (MISCELLANEOUS) IMPLANT
CANNULA SUMP PERICARDIAL (CANNULA) ×4 IMPLANT
CANNULA VEN MTL TIP RT (MISCELLANEOUS)
CANNULA VENNOUS METAL TIP 20FR (CANNULA) ×4 IMPLANT
CATH CPB KIT OWEN (MISCELLANEOUS) ×4 IMPLANT
CATH FOLEY 2WAY SLVR  5CC 14FR (CATHETERS)
CATH FOLEY 2WAY SLVR 5CC 14FR (CATHETERS) IMPLANT
CATH THORACIC 28FR RT ANG (CATHETERS) IMPLANT
CATH THORACIC 36FR (CATHETERS) ×4 IMPLANT
CHLORAPREP W/TINT 10.5 ML (MISCELLANEOUS) ×4 IMPLANT
CLIP FOGARTY SPRING 6M (CLIP) IMPLANT
CLIP VESOCCLUDE MED 24/CT (CLIP) ×12 IMPLANT
CLIP VESOCCLUDE SM WIDE 24/CT (CLIP) IMPLANT
CONN 1/2X1/2X1/2  BEN (MISCELLANEOUS) ×1
CONN 1/2X1/2X1/2 BEN (MISCELLANEOUS) ×3 IMPLANT
CONN 3/8X1/2 ST GISH (MISCELLANEOUS) ×8 IMPLANT
CONN ST 1/4X3/8  BEN (MISCELLANEOUS) ×3
CONN ST 1/4X3/8 BEN (MISCELLANEOUS) ×9 IMPLANT
CONT SPEC 4OZ CLIKSEAL STRL BL (MISCELLANEOUS) ×4 IMPLANT
COVER PROBE W GEL 5X96 (DRAPES) ×4 IMPLANT
COVER SURGICAL LIGHT HANDLE (MISCELLANEOUS) IMPLANT
CRADLE DONUT ADULT HEAD (MISCELLANEOUS) IMPLANT
DERMABOND ADVANCED (GAUZE/BANDAGES/DRESSINGS) ×1
DERMABOND ADVANCED .7 DNX12 (GAUZE/BANDAGES/DRESSINGS) ×3 IMPLANT
DEVICE ATRICLIP LAA PRCLPII 40 (Clip) ×3 IMPLANT
DEVICE SUT CK QUICK LOAD INDV (Prosthesis & Implant Heart) ×16 IMPLANT
DEVICE SUT CK QUICK LOAD MINI (Prosthesis & Implant Heart) ×4 IMPLANT
DRAIN CHANNEL 32F RND 10.7 FF (WOUND CARE) ×8 IMPLANT
DRAPE BILATERAL SPLIT (DRAPES) IMPLANT
DRAPE CARDIOVASCULAR INCISE (DRAPES) ×1
DRAPE CV SPLIT W-CLR ANES SCRN (DRAPES) IMPLANT
DRAPE INCISE IOBAN 66X45 STRL (DRAPES) ×4 IMPLANT
DRAPE SLUSH/WARMER DISC (DRAPES) ×4 IMPLANT
DRAPE SRG 135X102X78XABS (DRAPES) ×3 IMPLANT
DRSG COVADERM 4X14 (GAUZE/BANDAGES/DRESSINGS) ×4 IMPLANT
ELECT BLADE 4.0 EZ CLEAN MEGAD (MISCELLANEOUS) ×4
ELECT REM PT RETURN 9FT ADLT (ELECTROSURGICAL) ×8
ELECTRODE BLDE 4.0 EZ CLN MEGD (MISCELLANEOUS) ×3 IMPLANT
ELECTRODE REM PT RTRN 9FT ADLT (ELECTROSURGICAL) ×6 IMPLANT
FELT TEFLON 1X6 (MISCELLANEOUS) ×4 IMPLANT
GAUZE SPONGE 4X4 12PLY STRL (GAUZE/BANDAGES/DRESSINGS) ×8 IMPLANT
GAUZE SPONGE 4X4 12PLY STRL LF (GAUZE/BANDAGES/DRESSINGS) ×8 IMPLANT
GLOVE BIO SURGEON STRL SZ 6 (GLOVE) IMPLANT
GLOVE BIO SURGEON STRL SZ 6.5 (GLOVE) IMPLANT
GLOVE BIO SURGEON STRL SZ7 (GLOVE) IMPLANT
GLOVE BIO SURGEON STRL SZ7.5 (GLOVE) IMPLANT
GLOVE ORTHO TXT STRL SZ7.5 (GLOVE) ×12 IMPLANT
GOWN STRL REUS W/ TWL LRG LVL3 (GOWN DISPOSABLE) ×24 IMPLANT
GOWN STRL REUS W/TWL LRG LVL3 (GOWN DISPOSABLE) ×8
HEMOSTAT POWDER SURGIFOAM 1G (HEMOSTASIS) ×12 IMPLANT
INSERT FOGARTY XLG (MISCELLANEOUS) ×4 IMPLANT
KIT BASIN OR (CUSTOM PROCEDURE TRAY) ×4 IMPLANT
KIT DILATOR VASC 18G NDL (KITS) ×4 IMPLANT
KIT ROOM TURNOVER OR (KITS) ×4 IMPLANT
KIT SUCTION CATH 14FR (SUCTIONS) ×20 IMPLANT
KIT SUT CK MINI COMBO 4X17 (Prosthesis & Implant Heart) ×4 IMPLANT
KIT VASOVIEW HEMOPRO VH 3000 (KITS) ×4 IMPLANT
LEAD PACING MYOCARDI (MISCELLANEOUS) ×4 IMPLANT
LINE VENT (MISCELLANEOUS) ×4 IMPLANT
LOOP VESSEL SUPERMAXI WHITE (MISCELLANEOUS) ×4 IMPLANT
MARKER GRAFT CORONARY BYPASS (MISCELLANEOUS) ×12 IMPLANT
NS IRRIG 1000ML POUR BTL (IV SOLUTION) ×20 IMPLANT
PACK OPEN HEART (CUSTOM PROCEDURE TRAY) ×4 IMPLANT
PAD ARMBOARD 7.5X6 YLW CONV (MISCELLANEOUS) ×16 IMPLANT
PAD ELECT DEFIB RADIOL ZOLL (MISCELLANEOUS) ×4 IMPLANT
PENCIL BUTTON HOLSTER BLD 10FT (ELECTRODE) ×4 IMPLANT
PUNCH AORTIC ROT 4.0MM RCL 40 (MISCELLANEOUS) ×4 IMPLANT
PUNCH AORTIC ROTATE 4.0MM (MISCELLANEOUS) IMPLANT
PUNCH AORTIC ROTATE 4.5MM 8IN (MISCELLANEOUS) IMPLANT
PUNCH AORTIC ROTATE 5MM 8IN (MISCELLANEOUS) IMPLANT
SET CARDIOPLEGIA MPS 5001102 (MISCELLANEOUS) ×4 IMPLANT
SET IRRIG TUBING LAPAROSCOPIC (IRRIGATION / IRRIGATOR) ×4 IMPLANT
SOLUTION ANTI FOG 6CC (MISCELLANEOUS) ×4 IMPLANT
SPONGE LAP 18X18 X RAY DECT (DISPOSABLE) IMPLANT
SPONGE LAP 4X18 X RAY DECT (DISPOSABLE) IMPLANT
SUCKER INTRACARDIAC WEIGHTED (SUCKER) ×4 IMPLANT
SUT BONE WAX W31G (SUTURE) ×4 IMPLANT
SUT ETHIBON 2 0 V 52N 30 (SUTURE) ×4 IMPLANT
SUT ETHIBOND 2 0 SH (SUTURE) ×12 IMPLANT
SUT ETHIBOND 2 0 SH 36X2 (SUTURE) ×12 IMPLANT
SUT ETHIBOND 2 0 V4 (SUTURE) IMPLANT
SUT ETHIBOND 2 0V4 GREEN (SUTURE) IMPLANT
SUT ETHIBOND 4 0 TF (SUTURE) IMPLANT
SUT ETHIBOND 5 0 C 1 30 (SUTURE) ×4 IMPLANT
SUT ETHIBOND NAB MH 2-0 36IN (SUTURE) ×4 IMPLANT
SUT ETHIBOND X763 2 0 SH 1 (SUTURE) ×12 IMPLANT
SUT MNCRL AB 3-0 PS2 18 (SUTURE) ×8 IMPLANT
SUT MNCRL AB 4-0 PS2 18 (SUTURE) ×4 IMPLANT
SUT PDS AB 1 CTX 36 (SUTURE) ×8 IMPLANT
SUT PROLENE 2 0 SH DA (SUTURE) IMPLANT
SUT PROLENE 3 0 SH 1 (SUTURE) ×4 IMPLANT
SUT PROLENE 3 0 SH DA (SUTURE) ×8 IMPLANT
SUT PROLENE 3 0 SH1 36 (SUTURE) ×4 IMPLANT
SUT PROLENE 4 0 RB 1 (SUTURE) ×4
SUT PROLENE 4 0 SH DA (SUTURE) ×8 IMPLANT
SUT PROLENE 4-0 RB1 .5 CRCL 36 (SUTURE) ×12 IMPLANT
SUT PROLENE 5 0 C 1 36 (SUTURE) ×8 IMPLANT
SUT PROLENE 6 0 C 1 30 (SUTURE) ×12 IMPLANT
SUT PROLENE 7.0 RB 3 (SUTURE) ×12 IMPLANT
SUT PROLENE 8 0 BV175 6 (SUTURE) ×8 IMPLANT
SUT PROLENE BLUE 7 0 (SUTURE) ×4 IMPLANT
SUT PROLENE POLY MONO (SUTURE) IMPLANT
SUT SILK  1 MH (SUTURE) ×7
SUT SILK 1 MH (SUTURE) ×21 IMPLANT
SUT SILK 1 TIES 10X30 (SUTURE) ×4 IMPLANT
SUT SILK 2 0 SH CR/8 (SUTURE) ×8 IMPLANT
SUT SILK 2 0 TIES 10X30 (SUTURE) ×4 IMPLANT
SUT SILK 2 0 TIES 17X18 (SUTURE) ×1
SUT SILK 2-0 18XBRD TIE BLK (SUTURE) ×3 IMPLANT
SUT SILK 3 0 SH CR/8 (SUTURE) ×4 IMPLANT
SUT SILK 4 0 TIE 10X30 (SUTURE) ×8 IMPLANT
SUT STEEL 6MS V (SUTURE) IMPLANT
SUT STEEL STERNAL CCS#1 18IN (SUTURE) IMPLANT
SUT STEEL SZ 6 DBL 3X14 BALL (SUTURE) IMPLANT
SUT TEM PAC WIRE 2 0 SH (SUTURE) ×16 IMPLANT
SUT VIC AB 1 CTX 36 (SUTURE)
SUT VIC AB 1 CTX36XBRD ANBCTR (SUTURE) IMPLANT
SUT VIC AB 2-0 CT1 27 (SUTURE) ×1
SUT VIC AB 2-0 CT1 TAPERPNT 27 (SUTURE) ×3 IMPLANT
SUT VIC AB 2-0 CTX 27 (SUTURE) ×8 IMPLANT
SUT VIC AB 3-0 SH 27 (SUTURE)
SUT VIC AB 3-0 SH 27X BRD (SUTURE) IMPLANT
SUT VIC AB 3-0 X1 27 (SUTURE) IMPLANT
SUT VICRYL 4-0 PS2 18IN ABS (SUTURE) IMPLANT
SUTURE E-PAK OPEN HEART (SUTURE) IMPLANT
SYSTEM SAHARA CHEST DRAIN ATS (WOUND CARE) ×4 IMPLANT
TAPE CLOTH SURG 4X10 WHT LF (GAUZE/BANDAGES/DRESSINGS) ×4 IMPLANT
TAPE PAPER 2X10 WHT MICROPORE (GAUZE/BANDAGES/DRESSINGS) ×4 IMPLANT
TOWEL GREEN STERILE (TOWEL DISPOSABLE) ×8 IMPLANT
TOWEL GREEN STERILE FF (TOWEL DISPOSABLE) ×8 IMPLANT
TOWEL OR 17X24 6PK STRL BLUE (TOWEL DISPOSABLE) IMPLANT
TOWEL OR 17X26 10 PK STRL BLUE (TOWEL DISPOSABLE) IMPLANT
TRAY FOLEY SILVER 16FR TEMP (SET/KITS/TRAYS/PACK) ×4 IMPLANT
TUBING INSUFFLATION (TUBING) ×4 IMPLANT
TUBING INSUFFLATION 10FT LAP (TUBING) ×4 IMPLANT
UNDERPAD 30X30 (UNDERPADS AND DIAPERS) ×4 IMPLANT
VALVE MAGNA MITRAL 31MM (Prosthesis & Implant Heart) ×4 IMPLANT
WATER STERILE IRR 1000ML POUR (IV SOLUTION) ×8 IMPLANT

## 2017-04-23 NOTE — Transfer of Care (Signed)
Immediate Anesthesia Transfer of Care Note  Patient: [name]  Procedure(s) Performed: Procedure(s): MITRAL VALVE (MV) REPLACEMENT (N/A) CORONARY ARTERY BYPASS GRAFTING (CABG), ON PUMP, TIMES ONE, USING ENDOSCOPICALLY HARVESTED RIGHT GREATER SAPHENOUS VEIN (N/A) TRANSESOPHAGEAL ECHOCARDIOGRAM (TEE) (N/A) CLIPPING OF LEFT ATRIAL APPENDAGE  Patient Location: SICU  Anesthesia Type:General  Level of Consciousness: Patient remains intubated per anesthesia plan  Airway & Oxygen Therapy: Patient remains intubated per anesthesia plan and Patient placed on Ventilator (see vital sign flow sheet for setting)  Post-op Assessment: Report given to RN and Post -op Vital signs reviewed and stable  Post vital signs: Reviewed and stable  Last Vitals:  Vitals:   04/23/17 0733 04/23/17 0734  BP:    Pulse: 65 66  Resp: 17 16  Temp:    SpO2: 98% 98%    Last Pain:  Vitals:   04/23/17 0553  TempSrc: Oral      Patients Stated Pain Goal: 3 (33/54/56 2563)  Complications: No apparent anesthesia complications   Patient transported to ICU with standard monitors (HR, BP, SPO2, RR) and emergency drugs/equipment. Controlled ventilation maintained via ambu bag. Report given to bedside RN and respiratory therapist. All questioned answered before leaving.

## 2017-04-23 NOTE — Procedures (Signed)
Extubation Procedure Note  Patient Details:   Name: [name] DOB: 02/10/1934 MRN: [id number]   Airway Documentation:     Evaluation  O2 sats: stable throughout Complications: No apparent complications Patient did tolerate procedure well. Bilateral Breath Sounds: Other (Comment) (coarse throughout)   Yes  PT was extubated to a 3L   PT was able to cough and speak  Sats are stable  RT at bedside  NIF(-36) VC 6.0L  Oluchi Pucci, Leonie Douglas 04/23/2017, 6:25 PM

## 2017-04-23 NOTE — Progress Notes (Signed)
  Echocardiogram Echocardiogram Transesophageal has been performed.  [name] 04/23/2017, 11:46 AM

## 2017-04-23 NOTE — Interval H&P Note (Signed)
History and Physical Interval Note:  04/23/2017 6:56 AM  [name]  has presented today for surgery, with the diagnosis of mitral regurgitation, coronary artery disease  The various methods of treatment have been discussed with the patient and family. After consideration of risks, benefits and other options for treatment, the patient has consented to  Procedure(s): MITRAL VALVE REPAIR (MVR) or Replacement, coronary artery bypass grafting (N/A) MITRAL VALVE (MV) REPLACEMENT (N/A) CORONARY ARTERY BYPASS GRAFTING (CABG) (N/A) TRANSESOPHAGEAL ECHOCARDIOGRAM (TEE) (N/A) as a surgical intervention .  The patient's history has been reviewed, patient examined, no change in status, stable for surgery.  I have reviewed the patient's chart and labs.  Questions were answered to the patient's satisfaction.     [name]

## 2017-04-23 NOTE — Anesthesia Procedure Notes (Signed)
Arterial Line Insertion Start/End8/14/2018 7:11 AM, 04/23/2017 7:11 AM Performed by: Joni Fears, CRNA  Patient location: Pre-op. Preanesthetic checklist: patient identified, IV checked, site marked, risks and benefits discussed, surgical consent, monitors and equipment checked, pre-op evaluation, timeout performed and anesthesia consent Lidocaine 1% used for infiltration Left, radial was placed Catheter size: 20 G Hand hygiene performed  and maximum sterile barriers used  Allen's test indicative of satisfactory collateral circulation Attempts: 2 Procedure performed without using ultrasound guided technique. Following insertion, Biopatch and dressing applied. Post procedure assessment: normal  Patient tolerated the procedure well with no immediate complications.

## 2017-04-23 NOTE — Op Note (Signed)
CARDIOTHORACIC SURGERY OPERATIVE NOTE  Date of Procedure:  04/23/2017  Preoperative Diagnosis:   Severe Mitral Regurgitation  Severe Single Vessel Coronary Artery Disease  Postoperative Diagnosis: Same  Procedure:   Mitral Valve Replacement   Ed[employer] Magna Mitral Bovine Bioprosthetic Tissue Valve (size 67mm, model #7300TFX, serial #[id number])  Clipping of left atrial appendage (Atriclip size 45mm)   Coronary Artery Bypass Grafting x 1   Reversed Greater Saphenous Vein Graft to Distal Right Coronary Artery  Endoscopic Vein Harvest from Right Thigh  Surgeon: Valentina Gu. 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   Bileaflet prolapse with severe fibrosis and calcification  Type II mitral valve dysfunction with severe mitral regurgitation  Normal LV systolic function  Trace aortic insufficiency  Fair quality SVG conduit for grafting  Fair quality target vessel for grafting            BRIEF CLINICAL NOTE AND INDICATIONS FOR SURGERY  Patient is an 83 year old female with history of mitral valve prolapse, hypertension, type 2 diabetes mellitus without complication, scoliosis of the back status post lumbar fusion, and recently discovered coronary artery disease who has been referred for surgical consultation to discuss management of severe symptomatic primary mitral regurgitation and coronary artery disease. The patient states that she has been told that she had a heart murmur for most of her life and she was diagnosed with mitral valve prolapse many years ago at the [facility] clinic. She was told that eventually she might need to have surgery. She has been physically active and functionally independent and overall done quite well from a cardiac standpoint until recently. She has been followed for several years by Dr. Harrington Challenger and previous  echocardiograms have documented the presence of moderate mitral regurgitation with normal left ventricular systolic function. Over the past 2 or 3 months the patient has developed symptoms of exertional shortness of breath and substernal chest pressure. She has noticed significant dyspnea with activity and occasional swelling of both ankles. She has not had resting shortness of breath, PND, orthopnea, or lower extremity edema. She has had substernal chest pressure that typically associated with exertion but also can occur after meals. Symptoms usually relieved by rest. She has had increasing palpitations without dizzy spells or syncope. Follow-up transthoracic echocardiogram performed 02/12/2017 revealed normal left ventricular systolic function with mitral valve prolapse and at least moderate or severe mitral regurgitation. The patient subsequently underwent transesophageal echocardiogram on 03/04/2017. This confirmed the presence of normal left ventricular systolic function with bileaflet prolapse of the mitral valve. There were multiple regurgitant jets with one particularly severe jet of regurgitation coursing posteriorly around the left atrium. There was severe left atrial enlargement. Left and right heart catheterization was performed 03/28/2017. This revealed multivessel coronary artery disease with 100% chronic occlusion of the mid right coronary artery and left to right collateral filling of the terminal branches of the right coronary artery. There was diffuse nonobstructive disease in the left coronary circulation. Pulmonary artery pressures were normal. The patient was referred for surgical consultation. The patient has been seen in consultation and counseled at length regarding the indications, risks and potential benefits of surgery.  All questions have been answered, and the patient provides full informed consent for the operation as described.    DETAILS OF THE OPERATIVE  PROCEDURE  Preparation:  The patient is brought to the operating room on the above mentioned date and central  monitoring was established by the anesthesia team including placement of Swan-Ganz catheter and radial arterial line. The patient is placed in the supine position on the operating table.  Intravenous antibiotics are administered. General endotracheal anesthesia is induced uneventfully. A Foley catheter is placed.  Baseline transesophageal echocardiogram was performed.  Findings were notable for severe bileaflet prolapse of the mitral valve with severe mitral regurgitation. The mitral valve was relatively large in size with severe thickening and fibrosis involving the posterior leaflet in the distal portion of the anterior leaflet. There was excessive leaflet tissue with severe calcification and fibrosis involving the entire posterior leaflet. There were multiple jets of regurgitation. The chordae tendineae were elongated without rupture. There was some restriction of the anterior leaflet motion there appeared to be significant subvalvular calcification. There is mild left atrial enlargement. Left ventricular size and function was normal. The aortic valve was trileaflet. There was trace central aortic insufficiency. Right ventricular size and function was normal. There was trace to mild central tricuspid regurgitation.  The patient's chest, abdomen, both groins, and both lower extremities are prepared and draped in a sterile manner. A time out procedure is performed.   Surgical Approach and Conduit Harvest:  The greater saphenous vein is obtained from the patient's right thigh using endoscopic vein harvest technique. The saphenous vein is notably fair quality conduit with several varicosities that were not used. After removal of the saphenous vein, the small surgical incisions in the lower extremity are closed with absorbable suture.  A median sternotomy incision is performed. The pericardium is  opened. The ascending aorta is normal in appearance.   Extracorporeal Cardiopulmonary Bypass and Myocardial Protection:  The right common femoral vein is cannulated using the Seldinger technique and a guidewire advanced into the right atrium using TEE guidance.  The patient is heparinized systemically and the femoral vein cannulated using a 22 Fr long femoral venous cannula.  The ascending aorta is cannulated for cardiopulmonary bypass.  Adequate heparinization is verified.   A retrograde cardioplegia cannula is placed through the right atrium into the coronary sinus.   The entire pre-bypass portion of the operation was notable for stable hemodynamics.  Cardiopulmonary bypass was begun and the surface of the heart is inspected.  A second venous cannula is placed directly into the superior vena cava.   A cardioplegia cannula is placed in the ascending aorta.  A temperature probe was placed in the interventricular septum.  The patient is cooled to 28C systemic temperature.  The aortic cross clamp is applied and cardioplegia is delivered initially in an antegrade fashion through the aortic root using modified [place] cold blood cardioplegia ([place] blood cardioplegia protocol).   The initial cardioplegic arrest is rapid with early diastolic arrest.  The majority of the initial arresting dose was administered antegrade through the aortic root. Supplemental cardioplegia was administered retrograde through the coronary sinus catheter. A repeat dose of cardioplegia is administered through the subsequently placed vein graft.  Myocardial protection was felt to be excellent.   Clipping of Left Atrial Appendage:  The left atrial appendage was obliterated using an Atricure left atrial appendage clip (Atriclip Pro245, size 70mm).  The heart was replaced into the pericardial sac.   Coronary Artery Bypass Grafting:  The distal right coronary artery was grafted using a reversed greater saphenous vein  graft in an end-to-side fashion.  At the site of distal anastomosis the target vessel was fair quality and measured approximately 2.0 mm in diameter.   Mitral Valve Replacement:  A left atriotomy incision was performed through the interatrial groove and extended partially across the back wall of the left atrium after opening the oblique sinus inferiorly.  The floor of the left atrium and the mitral valve were exposed using a self-retaining retractor.  The mitral valve was inspected.  The mitral valve was severely deformed with classical Barlow's type myxomatous degenerative disease.  There was excessive leaflet tissue with a very large valve as well as severe fibrosis and calcification. Much of the posterior leaflet was restricted but there was severe prolapse involving both leaflets. There was thickening and fibrosis of the majority of the subvalvular apparatus.  Valve repair was not attempted.  The anterior leaflet of the mitral valve was excised sharply, leaving a small rim of the free margin and the associated primary chords.  The posterior leaflet split in the midline.  The mitral annulus was sized to accept a 31 mm prosthesis.  The left ventricle was irrigated with copious cold saline solution.  Mitral valve replacement was performed using interrupted horizontal mattress 2-0 Ethibond pledgeted sutures with pledgets in the supraannular position.  The remaining portions of the anterior leaflet were incorporated into the suture line laterally, thereby preserving chords to both the anterior and posterior leaflet.  An [facility] Mitral bovine bioprosthetic tissue valve (size 31 mm, model # 7300TFX, serial # [id number]) was implanted uneventfully. The valve seated appropriately with care to position the commissure posts away from the left ventricular outflow tract.  All sutures were secured using a Cor-knot device.  Rewarming is begun.  The atriotomy was closed using a 2-layer closure of running 3-0  Prolene suture after placing a sump drain across the mitral valve to serve as a left ventricular vent.     Procedure Completion:  The single proximal saphenous vein anastomosis was performed directly to the ascending aorta prior to removal of the aortic cross clamp. One final dose of warm retrograde "reanimation dose" cardioplegia was administered retrograde through the coronary sinus catheter while all air was evacuated through the aortic root.  The aortic cross clamp was removed after a total cross clamp time of 91 minutes.  Epicardial pacing wires are fixed to the right ventricular outflow tract and to the right atrial appendage. The patient is rewarmed to 37C temperature. The left ventricular vent and superior vena cava cannula are removed.  The patient is weaned and disconnected from cardiopulmonary bypass.  The patient's rhythm at separation from bypass was AV paced.  The patient was weaned from cardioplegic bypass without any inotropic support. Total cardiopulmonary bypass time for the operation was 120 minutes.  Followup transesophageal echocardiogram performed after separation from bypass revealed a well-seated mitral valve prosthesis that was functioning normally and without any sign of perivalvular leak.  Left ventricular function was unchanged from preoperatively.  The aortic cannula was removed uneventfully. Protamine was administered to reverse the anticoagulation. The femoral venous cannula was removed and manual pressure held on the groin for 30 minutes.  The mediastinum and pleural space were inspected for hemostasis and irrigated with saline solution. The mediastinum and both pleural spaces were drained using 4 chest tubes placed through separate stab incisions inferiorly.  The soft tissues anterior to the aorta were reapproximated loosely. The sternum is closed with double strength sternal wire. The soft tissues anterior to the sternum were closed in multiple layers and the skin is  closed with a running subcuticular skin closure.   The post-bypass portion of the operation was notable for stable  rhythm and hemodynamics.   No blood products were administered during the operation.   Patient Disposition:  The patient tolerated the procedure well and is transported to the surgical intensive care in stable condition. There are no intraoperative complications. All sponge instrument and needle counts are verified correct at completion of the operation.     Valentina Gu. Roxy Manns MD 04/23/2017 12:38 PM

## 2017-04-23 NOTE — Brief Op Note (Addendum)
04/23/2017  11:17 AM  PATIENT:  [name]  83 y.o. female  PRE-OPERATIVE DIAGNOSIS:  1. Severe Mitral Regurgitation 2. Single vessel coronary artery disease  POST-OPERATIVE DIAGNOSIS:  1. Severe Mitral Regurgitation 2. 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

## 2017-04-23 NOTE — Anesthesia Procedure Notes (Signed)
Central Venous Catheter Insertion Performed by: Roderic Palau, anesthesiologist Patient location: Pre-op. Preanesthetic checklist: patient identified, IV checked, site marked, risks and benefits discussed, surgical consent, monitors and equipment checked, pre-op evaluation, timeout performed and anesthesia consent Position: Trendelenburg Lidocaine 1% used for infiltration and patient sedated Hand hygiene performed , maximum sterile barriers used  and Seldinger technique used Catheter size: 9 Fr Total catheter length 10. Central line was placed.MAC introducer Swan type:thermodilution PA Cath depth:48 Procedure performed using ultrasound guided technique. Ultrasound Notes:anatomy identified, needle tip was noted to be adjacent to the nerve/plexus identified, no ultrasound evidence of intravascular and/or intraneural injection and image(s) printed for medical record Attempts: 1 Following insertion, line sutured and dressing applied. Post procedure assessment: blood return through all ports, free fluid flow and no air  Patient tolerated the procedure well with no immediate complications.

## 2017-04-23 NOTE — Anesthesia Procedure Notes (Signed)
Procedure Name: Intubation Date/Time: 04/23/2017 7:59 AM Performed by: Julieta Bellini Pre-anesthesia Checklist: Patient identified, Emergency Drugs available, Suction available and Patient being monitored Patient Re-evaluated:Patient Re-evaluated prior to induction Oxygen Delivery Method: Circle system utilized Preoxygenation: Pre-oxygenation with 100% oxygen Induction Type: IV induction Ventilation: Mask ventilation without difficulty Laryngoscope Size: Mac and 4 Grade View: Grade I Tube type: Oral Tube size: 7.5 mm Number of attempts: 1 Airway Equipment and Method: Stylet Placement Confirmation: ETT inserted through vocal cords under direct vision,  positive ETCO2 and breath sounds checked- equal and bilateral Secured at: 22 cm Tube secured with: Tape Dental Injury: Teeth and Oropharynx as per pre-operative assessment

## 2017-04-24 ENCOUNTER — Inpatient Hospital Stay (HOSPITAL_COMMUNITY): Payer: Medicare Other

## 2017-04-24 ENCOUNTER — Encounter (HOSPITAL_COMMUNITY): Payer: Self-pay | Admitting: Thoracic Surgery (Cardiothoracic Vascular Surgery)

## 2017-04-24 LAB — BASIC METABOLIC PANEL
Anion gap: 9 (ref 5–15)
BUN: 10 mg/dL (ref 6–20)
CALCIUM: 7.6 mg/dL — AB (ref 8.9–10.3)
CO2: 21 mmol/L — AB (ref 22–32)
CREATININE: 0.52 mg/dL (ref 0.44–1.00)
Chloride: 101 mmol/L (ref 101–111)
GFR calc Af Amer: >60 mL/min (ref >60)
GFR calc non Af Amer: >60 mL/min (ref >60)
GLUCOSE: 106 mg/dL — AB (ref 65–99)
Potassium: 3.8 mmol/L (ref 3.5–5.1)
Sodium: 131 mmol/L — ABNORMAL LOW (ref 135–145)

## 2017-04-24 LAB — TYPE AND SCREEN
ABO/RH(D): O NEG
Antibody Screen: NEGATIVE
UNIT DIVISION: 0
Unit division: 0
Unit division: 0

## 2017-04-24 LAB — GLUCOSE, CAPILLARY
GLUCOSE-CAPILLARY: 104 mg/dL — AB (ref 65–99)
GLUCOSE-CAPILLARY: 114 mg/dL — AB (ref 65–99)
GLUCOSE-CAPILLARY: 68 mg/dL (ref 65–99)
GLUCOSE-CAPILLARY: 75 mg/dL (ref 65–99)
GLUCOSE-CAPILLARY: 86 mg/dL (ref 65–99)
GLUCOSE-CAPILLARY: 87 mg/dL (ref 65–99)
GLUCOSE-CAPILLARY: 97 mg/dL (ref 65–99)
Glucose-Capillary: 108 mg/dL — ABNORMAL HIGH (ref 65–99)
Glucose-Capillary: 116 mg/dL — ABNORMAL HIGH (ref 65–99)
Glucose-Capillary: 171 mg/dL — ABNORMAL HIGH (ref 65–99)
Glucose-Capillary: 185 mg/dL — ABNORMAL HIGH (ref 65–99)
Glucose-Capillary: 85 mg/dL (ref 65–99)
Glucose-Capillary: 95 mg/dL (ref 65–99)

## 2017-04-24 LAB — POCT I-STAT 3, ART BLOOD GAS (G3+)
ACID-BASE DEFICIT: 5 mmol/L — AB (ref 0.0–2.0)
Bicarbonate: 20.4 mmol/L (ref 20.0–28.0)
O2 Saturation: 92 %
TCO2: 22 mmol/L (ref 0–100)
pCO2 arterial: 38.2 mmHg (ref 32.0–48.0)
pH, Arterial: 7.335 — ABNORMAL LOW (ref 7.350–7.450)
pO2, Arterial: 68 mmHg — ABNORMAL LOW (ref 83.0–108.0)

## 2017-04-24 LAB — CREATININE, SERUM
Creatinine, Ser: 0.61 mg/dL (ref 0.44–1.00)
GFR calc Af Amer: >60 mL/min (ref >60)
GFR calc non Af Amer: >60 mL/min (ref >60)

## 2017-04-24 LAB — BPAM RBC
BLOOD PRODUCT EXPIRATION DATE: 201808212359
BLOOD PRODUCT EXPIRATION DATE: 201808212359
Blood Product Expiration Date: 201808252359
ISSUE DATE / TIME: 201808141657
ISSUE DATE / TIME: 201808141409
ISSUE DATE / TIME: 201808141515
Unit Type and Rh: 9500
Unit Type and Rh: 9500
Unit Type and Rh: 9500

## 2017-04-24 LAB — CBC
HCT: 31 % — ABNORMAL LOW (ref 36.0–46.0)
HEMATOCRIT: 31.4 % — AB (ref 36.0–46.0)
Hemoglobin: 10.8 g/dL — ABNORMAL LOW (ref 12.0–15.0)
Hemoglobin: 10.4 g/dL — ABNORMAL LOW (ref 12.0–15.0)
MCH: 30.9 pg (ref 26.0–34.0)
MCH: 29.8 pg (ref 26.0–34.0)
MCHC: 34.4 g/dL (ref 30.0–36.0)
MCHC: 33.5 g/dL (ref 30.0–36.0)
MCV: 90 fL (ref 78.0–100.0)
MCV: 88.8 fL (ref 78.0–100.0)
PLATELETS: 168 10*3/uL (ref 150–400)
PLATELETS: 161 10*3/uL (ref 150–400)
RBC: 3.49 MIL/uL — ABNORMAL LOW (ref 3.87–5.11)
RBC: 3.49 MIL/uL — ABNORMAL LOW (ref 3.87–5.11)
RDW: 14.9 % (ref 11.5–15.5)
RDW: 14.9 % (ref 11.5–15.5)
WBC: 8.8 10*3/uL (ref 4.0–10.5)
WBC: 10.1 10*3/uL (ref 4.0–10.5)

## 2017-04-24 LAB — POCT I-STAT, CHEM 8
BUN: 14 mg/dL (ref 6–20)
CHLORIDE: 93 mmol/L — AB (ref 101–111)
CREATININE: 0.6 mg/dL (ref 0.44–1.00)
Calcium, Ion: 1.16 mmol/L (ref 1.15–1.40)
GLUCOSE: 142 mg/dL — AB (ref 65–99)
HEMATOCRIT: 32 % — AB (ref 36.0–46.0)
HEMOGLOBIN: 10.9 g/dL — AB (ref 12.0–15.0)
POTASSIUM: 3.7 mmol/L (ref 3.5–5.1)
Sodium: 131 mmol/L — ABNORMAL LOW (ref 135–145)
TCO2: 23 mmol/L (ref 0–100)

## 2017-04-24 LAB — MAGNESIUM
Magnesium: 1.7 mg/dL (ref 1.7–2.4)
Magnesium: 1.4 mg/dL — ABNORMAL LOW (ref 1.7–2.4)

## 2017-04-24 MED ORDER — AMIODARONE LOAD VIA INFUSION
150.0000 mg | Freq: Once | INTRAVENOUS | Status: AC
  Administered 2017-04-24: 150 mg via INTRAVENOUS
  Filled 2017-04-24: qty 83.34

## 2017-04-24 MED ORDER — FUROSEMIDE 10 MG/ML IJ SOLN
20.0000 mg | Freq: Once | INTRAMUSCULAR | Status: AC
  Administered 2017-04-24: 20 mg via INTRAVENOUS
  Filled 2017-04-24: qty 2

## 2017-04-24 MED ORDER — WARFARIN SODIUM 2 MG PO TABS
2.0000 mg | ORAL_TABLET | Freq: Every day | ORAL | Status: DC
  Administered 2017-04-24 – 2017-04-25 (×2): 2 mg via ORAL
  Filled 2017-04-24 (×2): qty 1

## 2017-04-24 MED ORDER — AMIODARONE HCL IN DEXTROSE 360-4.14 MG/200ML-% IV SOLN
30.0000 mg/h | INTRAVENOUS | Status: DC
  Administered 2017-04-24 – 2017-04-26 (×3): 30 mg/h via INTRAVENOUS
  Filled 2017-04-24 (×2): qty 200

## 2017-04-24 MED ORDER — WARFARIN - PHYSICIAN DOSING INPATIENT
Freq: Every day | Status: DC
  Administered 2017-04-27 – 2017-05-02 (×2)

## 2017-04-24 MED ORDER — SODIUM CHLORIDE 0.9% FLUSH: 10.0000 mL | INTRAVENOUS | Status: DC | PRN

## 2017-04-24 MED ORDER — INSULIN ASPART 100 UNIT/ML ~~LOC~~ SOLN
0.0000 [IU] | SUBCUTANEOUS | Status: DC
  Administered 2017-04-24: 2 [IU] via SUBCUTANEOUS
  Administered 2017-04-24: 4 [IU] via SUBCUTANEOUS
  Administered 2017-04-24: 2 [IU] via SUBCUTANEOUS

## 2017-04-24 MED ORDER — AMIODARONE HCL IN DEXTROSE 360-4.14 MG/200ML-% IV SOLN
60.0000 mg/h | INTRAVENOUS | Status: AC
  Administered 2017-04-24 (×2): 60 mg/h via INTRAVENOUS
  Filled 2017-04-24 (×2): qty 200

## 2017-04-24 MED ORDER — SODIUM CHLORIDE 0.9% FLUSH
10.0000 mL | Freq: Two times a day (BID) | INTRAVENOUS | Status: DC
  Administered 2017-04-25 – 2017-04-28 (×6): 10 mL

## 2017-04-24 MED ORDER — INSULIN DETEMIR 100 UNIT/ML ~~LOC~~ SOLN
20.0000 [IU] | Freq: Once | SUBCUTANEOUS | Status: AC
  Administered 2017-04-24: 20 [IU] via SUBCUTANEOUS
  Filled 2017-04-24 (×2): qty 0.2

## 2017-04-24 MED FILL — Electrolyte-R (PH 7.4) Solution: INTRAVENOUS | Qty: 3000 | Status: AC

## 2017-04-24 MED FILL — Sodium Bicarbonate IV Soln 8.4%: INTRAVENOUS | Qty: 50 | Status: AC

## 2017-04-24 MED FILL — Sodium Chloride IV Soln 0.9%: INTRAVENOUS | Qty: 2000 | Status: AC

## 2017-04-24 MED FILL — Mannitol IV Soln 20%: INTRAVENOUS | Qty: 500 | Status: AC

## 2017-04-24 NOTE — Progress Notes (Signed)
Tech offered Pt a bath. Pt stated she would like to wait until later due to pain.

## 2017-04-24 NOTE — Progress Notes (Signed)
CT surgery p.m. Rounds   Patient resting comfortably Paced rhythm 80/m, O2 sat 97% P.m. labs reviewed and are satisfactory

## 2017-04-24 NOTE — Care Management Note (Signed)
Case Management Note  Patient Details  Name: [name] MRN: [id number] Date of Birth: 1933/11/30  Subjective/Objective:   From home with spouse, POD 1 CABG, MVR, wean drips, dc tubes today or tomorrow depending on output.                 Action/Plan: NCM will follow for dc needs.  Expected Discharge Date:                  Expected Discharge Plan:     In-House Referral:     Discharge planning Services  CM Consult  Post Acute Care Choice:    Choice offered to:     DME Arranged:    DME Agency:     HH Arranged:    HH Agency:     Status of Service:  In process, will continue to follow  If discussed at Long Length of Stay Meetings, dates discussed:    Additional Comments:  [name], RN 04/24/2017, 11:02 AM

## 2017-04-24 NOTE — Anesthesia Postprocedure Evaluation (Signed)
Anesthesia Post Note  Patient: [name]  Procedure(s) Performed: Procedure(s) (LRB): MITRAL VALVE (MV) REPLACEMENT (N/A) CORONARY ARTERY BYPASS GRAFTING (CABG), ON PUMP, TIMES ONE, USING ENDOSCOPICALLY HARVESTED RIGHT GREATER SAPHENOUS VEIN (N/A) TRANSESOPHAGEAL ECHOCARDIOGRAM (TEE) (N/A) CLIPPING OF LEFT ATRIAL APPENDAGE     Patient location during evaluation: SICU Anesthesia Type: General Level of consciousness: awake and alert Pain management: pain level controlled Vital Signs Assessment: post-procedure vital signs reviewed and stable Respiratory status: spontaneous breathing Cardiovascular status: stable Anesthetic complications: no Comments: Pt complaining of minor chest discomfort from surgical site.     Last Vitals:  Vitals:   04/24/17 0700 04/24/17 1156  BP: (!) 86/52   Pulse: 78   Resp: 17   Temp: 37.3 C 36.7 C  SpO2: (!) 88%     Last Pain:  Vitals:   04/24/17 1156  TempSrc: Oral                 Effie Berkshire

## 2017-04-24 NOTE — Progress Notes (Signed)
[address]       [place] [address]             [phone]        CARDIOTHORACIC SURGERY PROGRESS NOTE   R1 Day Post-Op Procedure(s) (LRB): MITRAL VALVE (MV) REPLACEMENT (N/A) CORONARY ARTERY BYPASS GRAFTING (CABG), ON PUMP, TIMES ONE, USING ENDOSCOPICALLY HARVESTED RIGHT GREATER SAPHENOUS VEIN (N/A) TRANSESOPHAGEAL ECHOCARDIOGRAM (TEE) (N/A) CLIPPING OF LEFT ATRIAL APPENDAGE  Subjective: Looks good.  Mild soreness in chest.  Reports feeling hungry.  No SOB  Objective: Vital signs: BP Readings from Last 1 Encounters:  04/24/17 (!) 86/52   Pulse Readings from Last 1 Encounters:  04/24/17 78   Resp Readings from Last 1 Encounters:  04/24/17 17   Temp Readings from Last 1 Encounters:  04/24/17 99.1 F (37.3 C)    Hemodynamics: PAP: (21-66)/(10-49) 66/49 CO:  [2.7 L/min-4 L/min] 4 L/min CI:  [1.6 L/min/m2-2.4 L/min/m2] 2.4 L/min/m2  Physical Exam:  Rhythm:   AV paced  Breath sounds: clear  Heart sounds:  RRR w/out murmur  Incisions:  Dressings dry, intact  Abdomen:  Soft, non-distended, non-tender  Extremities:  Warm, well-perfused  Chest tubes:  low volume thin serosanguinous output, no air leak    Intake/Output from previous day: 08/14 0701 - 08/15 0700 In: 7239 [I.V.:4939; Blood:750; IV Piggyback:1550] Out: 4255 [Urine:3635; Chest Tube:620] Intake/Output this shift: No intake/output data recorded.  Lab Results:  CBC: Recent Labs  04/23/17 1900 04/23/17 1905 04/24/17 0448  WBC 10.6*  --  8.8  HGB 11.1* 10.5* 10.8*  HCT 32.1* 31.0* 31.4*  PLT 175  --  168    BMET:  Recent Labs  04/23/17 1905 04/24/17 0448  NA 132* 131*  K 5.1 3.8  CL 101 101  CO2  --  21*  GLUCOSE 127* 106*  BUN 12 10  CREATININE 0.40* 0.52  CALCIUM  --  7.6*     PT/INR:   Recent Labs  04/23/17 1315  LABPROT 18.2*  INR 1.50    CBG (last 3)   Recent Labs  04/24/17 0041 04/24/17 0145 04/24/17 0758  GLUCAP 114* 75 95    ABG   Component Value Date/Time   PHART 7.335 (L) 04/24/2017 0444   PCO2ART 38.2 04/24/2017 0444   PO2ART 68.0 (L) 04/24/2017 0444   HCO3 20.4 04/24/2017 0444   TCO2 22 04/24/2017 0444   ACIDBASEDEF 5.0 (H) 04/24/2017 0444   O2SAT 92.0 04/24/2017 0444    CXR: PORTABLE CHEST 1 VIEW  COMPARISON:  04/23/2017.  FINDINGS: Interim extubation and removal of NG tube. Swan-Ganz catheter, mediastinal drainage catheters, bilateral chest tubes in stable position. Prior CABG and cardiac valve replacement. Cardiomegaly with mild bilateral interstitial prominence. Small left pleural effusion. Mild component CHF cannot be excluded. No pneumothorax.  IMPRESSION: 1. Interim extubation removal of NG tube. Remaining lines and tubes including bilateral chest tubes in stable position. No pneumothorax.  2. Prior CABG and cardiac valve replacement. Cardiomegaly with mild bilateral interstitial prominence and small left pleural effusion. A mild component CHF cannot be excluded on today's exam.   Electronically Signed   By: Marcello Moores  Register   On: 04/24/2017 07:37  Assessment/Plan: S/P Procedure(s) (LRB): MITRAL VALVE (MV) REPLACEMENT (N/A) CORONARY ARTERY BYPASS GRAFTING (CABG), ON PUMP, TIMES ONE, USING ENDOSCOPICALLY HARVESTED RIGHT GREATER SAPHENOUS VEIN (N/A) TRANSESOPHAGEAL ECHOCARDIOGRAM (TEE) (N/A) CLIPPING OF LEFT ATRIAL APPENDAGE  Overall doing well POD1 Maintaining AV paced rhythm w/ stable hemodynamics on low dose dopamine and  Neo drips Breathing comfortably w/ O2 sats 99-100% on 4 L/min Chronic diastolic CHF with expected post-op volume excess, weight not recorded Expected post op acute blood loss anemia with iron-deficient anemia preop, Hgb 10.8 this morning Expected post op atelectasis, mild Type II diabetes mellitus, tight glycemic control on insulin drip   Mobilize and d/c lines  Wean drips slowly  Hold diuretics until BP increases D/C tubes later today or tomorrow,  depending on output Start Coumadin slowly One dose levemir and wean insulin drip   [name], MD 04/24/2017 9:14 AM

## 2017-04-25 ENCOUNTER — Other Ambulatory Visit: Payer: Self-pay | Admitting: Internal Medicine

## 2017-04-25 ENCOUNTER — Inpatient Hospital Stay (HOSPITAL_COMMUNITY): Payer: Medicare Other

## 2017-04-25 LAB — CBC
HCT: 31.8 % — ABNORMAL LOW (ref 36.0–46.0)
Hemoglobin: 10.8 g/dL — ABNORMAL LOW (ref 12.0–15.0)
MCH: 30.3 pg (ref 26.0–34.0)
MCHC: 34 g/dL (ref 30.0–36.0)
MCV: 89.1 fL (ref 78.0–100.0)
PLATELETS: 164 10*3/uL (ref 150–400)
RBC: 3.57 MIL/uL — ABNORMAL LOW (ref 3.87–5.11)
RDW: 14.9 % (ref 11.5–15.5)
WBC: 10.6 10*3/uL — ABNORMAL HIGH (ref 4.0–10.5)

## 2017-04-25 LAB — BASIC METABOLIC PANEL
Anion gap: 8 (ref 5–15)
BUN: 12 mg/dL (ref 6–20)
CALCIUM: 8.1 mg/dL — AB (ref 8.9–10.3)
CO2: 25 mmol/L (ref 22–32)
CREATININE: 0.51 mg/dL (ref 0.44–1.00)
Chloride: 97 mmol/L — ABNORMAL LOW (ref 101–111)
GFR calc Af Amer: >60 mL/min (ref >60)
GFR calc non Af Amer: >60 mL/min (ref >60)
GLUCOSE: 61 mg/dL — AB (ref 65–99)
Potassium: 3.5 mmol/L (ref 3.5–5.1)
Sodium: 130 mmol/L — ABNORMAL LOW (ref 135–145)

## 2017-04-25 LAB — GLUCOSE, CAPILLARY
GLUCOSE-CAPILLARY: 92 mg/dL (ref 65–99)
GLUCOSE-CAPILLARY: 141 mg/dL — AB (ref 65–99)
GLUCOSE-CAPILLARY: 127 mg/dL — AB (ref 65–99)
GLUCOSE-CAPILLARY: 204 mg/dL — AB (ref 65–99)
GLUCOSE-CAPILLARY: 113 mg/dL — AB (ref 65–99)
GLUCOSE-CAPILLARY: 162 mg/dL — AB (ref 65–99)
GLUCOSE-CAPILLARY: 150 mg/dL — AB (ref 65–99)
GLUCOSE-CAPILLARY: 150 mg/dL — AB (ref 65–99)
Glucose-Capillary: 101 mg/dL — ABNORMAL HIGH (ref 65–99)
Glucose-Capillary: 134 mg/dL — ABNORMAL HIGH (ref 65–99)
Glucose-Capillary: 53 mg/dL — ABNORMAL LOW (ref 65–99)
Glucose-Capillary: 81 mg/dL (ref 65–99)

## 2017-04-25 LAB — PROTIME-INR
INR: 1.16
PROTHROMBIN TIME: 14.8 s (ref 11.4–15.2)

## 2017-04-25 MED ORDER — DEXTROSE 50 % IV SOLN
INTRAVENOUS | Status: AC
  Administered 2017-04-25: 25 mL
  Filled 2017-04-25: qty 50

## 2017-04-25 MED ORDER — SODIUM CHLORIDE 0.9% FLUSH: 3.0000 mL | INTRAVENOUS | Status: DC | PRN

## 2017-04-25 MED ORDER — MOVING RIGHT ALONG BOOK
Freq: Once | Status: AC
  Administered 2017-04-25: 08:00:00
  Filled 2017-04-25: qty 1

## 2017-04-25 MED ORDER — POTASSIUM CHLORIDE 10 MEQ/50ML IV SOLN
10.0000 meq | INTRAVENOUS | Status: AC
  Administered 2017-04-25 (×2): 10 meq via INTRAVENOUS

## 2017-04-25 MED ORDER — INSULIN ASPART 100 UNIT/ML ~~LOC~~ SOLN
0.0000 [IU] | Freq: Three times a day (TID) | SUBCUTANEOUS | Status: DC
  Administered 2017-04-25: 4 [IU] via SUBCUTANEOUS
  Administered 2017-04-25 – 2017-04-27 (×8): 2 [IU] via SUBCUTANEOUS
  Administered 2017-04-28 (×2): 4 [IU] via SUBCUTANEOUS
  Administered 2017-04-28 – 2017-04-29 (×3): 2 [IU] via SUBCUTANEOUS
  Administered 2017-04-29: 8 [IU] via SUBCUTANEOUS
  Administered 2017-04-29 – 2017-04-30 (×3): 2 [IU] via SUBCUTANEOUS
  Administered 2017-04-30: 3 [IU] via SUBCUTANEOUS
  Administered 2017-04-30: 8 [IU] via SUBCUTANEOUS
  Administered 2017-05-01 (×2): 2 [IU] via SUBCUTANEOUS
  Administered 2017-05-01: 8 [IU] via SUBCUTANEOUS
  Administered 2017-05-02: 2 [IU] via SUBCUTANEOUS
  Administered 2017-05-02 (×2): 4 [IU] via SUBCUTANEOUS
  Administered 2017-05-03: 2 [IU] via SUBCUTANEOUS

## 2017-04-25 MED ORDER — FUROSEMIDE 40 MG PO TABS: 40.0000 mg | ORAL_TABLET | Freq: Two times a day (BID) | ORAL | Status: DC

## 2017-04-25 MED ORDER — ALUM & MAG HYDROXIDE-SIMETH 200-200-20 MG/5ML PO SUSP
30.0000 mL | ORAL | Status: DC | PRN
  Administered 2017-04-25: 30 mL via ORAL
  Filled 2017-04-25: qty 30

## 2017-04-25 MED ORDER — METOCLOPRAMIDE HCL 5 MG/ML IJ SOLN
10.0000 mg | Freq: Four times a day (QID) | INTRAMUSCULAR | Status: DC
  Administered 2017-04-25 – 2017-04-28 (×15): 10 mg via INTRAVENOUS
  Filled 2017-04-25 (×15): qty 2

## 2017-04-25 MED ORDER — POTASSIUM CHLORIDE 10 MEQ/50ML IV SOLN
10.0000 meq | INTRAVENOUS | Status: AC
  Administered 2017-04-25 (×3): 10 meq via INTRAVENOUS
  Filled 2017-04-25 (×3): qty 50

## 2017-04-25 MED ORDER — SODIUM CHLORIDE 0.9% FLUSH
3.0000 mL | Freq: Two times a day (BID) | INTRAVENOUS | Status: DC
  Administered 2017-04-25: 10 mL via INTRAVENOUS
  Administered 2017-04-26 – 2017-04-30 (×3): 3 mL via INTRAVENOUS

## 2017-04-25 MED ORDER — SODIUM CHLORIDE 0.9 % IV SOLN: 250.0000 mL | INTRAVENOUS | Status: DC | PRN

## 2017-04-25 MED ORDER — FUROSEMIDE 10 MG/ML IJ SOLN
20.0000 mg | Freq: Four times a day (QID) | INTRAMUSCULAR | Status: AC
  Administered 2017-04-25 (×3): 20 mg via INTRAVENOUS
  Filled 2017-04-25 (×3): qty 2

## 2017-04-25 MED ORDER — ORAL CARE MOUTH RINSE
15.0000 mL | Freq: Two times a day (BID) | OROMUCOSAL | Status: DC
  Administered 2017-04-26 – 2017-05-07 (×11): 15 mL via OROMUCOSAL

## 2017-04-25 NOTE — Progress Notes (Signed)
[address]       [place] [address]             [phone]        CARDIOTHORACIC SURGERY PROGRESS NOTE   R2 Days Post-Op Procedure(s) (LRB): MITRAL VALVE (MV) REPLACEMENT (N/A) CORONARY ARTERY BYPASS GRAFTING (CABG), ON PUMP, TIMES ONE, USING ENDOSCOPICALLY HARVESTED RIGHT GREATER SAPHENOUS VEIN (N/A) TRANSESOPHAGEAL ECHOCARDIOGRAM (TEE) (N/A) CLIPPING OF LEFT ATRIAL APPENDAGE  Subjective: Looks good but complains that she couldn't sleep last night.  Minimal soreness.  No SOB.  No nausea  Objective: Vital signs: BP Readings from Last 1 Encounters:  04/25/17 129/71   Pulse Readings from Last 1 Encounters:  04/25/17 80   Resp Readings from Last 1 Encounters:  04/25/17 (!) 23   Temp Readings from Last 1 Encounters:  04/25/17 97.9 F (36.6 C) (Oral)    Hemodynamics: PAP: (30-40)/(14-26) 30/14 CO:  [3.8 L/min] 3.8 L/min CI:  [2.3 L/min/m2] 2.3 L/min/m2  Physical Exam:  Rhythm:   sinus  Breath sounds: clear  Heart sounds:  RRR w/out murmur  Incisions:  Dressing dry, intact  Abdomen:  Soft, non-distended, non-tender  Extremities:  Warm, well-perfused  Chest tubes:  low volume thin serosanguinous output, no air leak    Intake/Output from previous day: 08/15 0701 - 08/16 0700 In: 1220.8 [P.O.:220; I.V.:750.8; IV Piggyback:250] Out: 1285 [Urine:625; Chest Tube:660] Intake/Output this shift: No intake/output data recorded.  Lab Results:  CBC: Recent Labs  04/24/17 1729 04/24/17 1736 04/25/17 0322  WBC 10.1  --  10.6*  HGB 10.4* 10.9* 10.8*  HCT 31.0* 32.0* 31.8*  PLT 161  --  164    BMET:  Recent Labs  04/24/17 0448  04/24/17 1736 04/25/17 0322  NA 131*  --  131* 130*  K 3.8  --  3.7 3.5  CL 101  --  93* 97*  CO2 21*  --   --  25  GLUCOSE 106*  --  142* 61*  BUN 10  --  14 12  CREATININE 0.52  < > 0.60 0.51  CALCIUM 7.6*  --   --  8.1*  < > = values in this interval not displayed.   PT/INR:   Recent Labs   04/25/17 0322  LABPROT 14.8  INR 1.16    CBG (last 3)   Recent Labs  04/25/17 0012 04/25/17 0327 04/25/17 0352  GLUCAP 81 53* 113*    ABG    Component Value Date/Time   PHART 7.335 (L) 04/24/2017 0444   PCO2ART 38.2 04/24/2017 0444   PO2ART 68.0 (L) 04/24/2017 0444   HCO3 20.4 04/24/2017 0444   TCO2 23 04/24/2017 1736   ACIDBASEDEF 5.0 (H) 04/24/2017 0444   O2SAT 92.0 04/24/2017 0444    CXR: PORTABLE CHEST 1 VIEW  COMPARISON:  04/24/2017  FINDINGS: Postsurgical changes are again identified. A left-sided thoracostomy catheter is seen. No pneumothorax is noted. Pericardial drain is again noted. Some right basilar atelectasis is seen new from the prior exam. Right jugular sheath remains in place.  IMPRESSION: New right basilar atelectasis.   Electronically Signed   By: Inez Catalina M.D.   On: 04/25/2017 07:16  Assessment/Plan: S/P Procedure(s) (LRB): MITRAL VALVE (MV) REPLACEMENT (N/A) CORONARY ARTERY BYPASS GRAFTING (CABG), ON PUMP, TIMES ONE, USING ENDOSCOPICALLY HARVESTED RIGHT GREATER SAPHENOUS VEIN (N/A) TRANSESOPHAGEAL ECHOCARDIOGRAM (TEE) (N/A) CLIPPING OF LEFT ATRIAL APPENDAGE  Overall doing well POD2 Back in NSR w/ stable BP Breathing comfortably w/ O2 sats 92-97% on  2 L/min Chronic diastolic CHF with expected post-op volume excess, weight reportedly 13 lbs > preop Expected post op acute blood loss anemia with iron-deficient anemia preop, Hgb 10.8 stable this morning Expected post op atelectasis, mild Hypokalemia, induced by loop diuretics Type II diabetes mellitus, hypoglycemic overnight    Mobilize  Diuresis  Continue IV amiodarone for now  Wean dopamine off  Supplement potassium  [name], MD 04/25/2017 8:13 AM

## 2017-04-25 NOTE — Progress Notes (Signed)
Hypoglycemic Event  CBG: 53  Treatment: D50 IV 25 mL  Symptoms: None  Follow-up CBG: Time: 0352 CBG Result: 113  Possible Reasons for Event: Unknown  Comments/MD notified: No    [name] F

## 2017-04-25 NOTE — Progress Notes (Signed)
Pt not voided in 7.5 hours since catheter removal.  Had an urge to urinate at 1630, but unable to.  Bladder scan at 1800 showed 122cc.  Will report off to night RN to follow.    Darrel Reach, RN

## 2017-04-25 NOTE — Progress Notes (Signed)
CRITICAL VALUE ALERT  Critical Value:  CBG 68  Date & Time Notied:  04/24/17  2341  Provider Notified: This RN notified  Orders Received/Actions taken: Patient given 8oz orange juice.  Follow up CBG: 81 @ 04/25/17 @0013   Levon Hedger, RN

## 2017-04-25 NOTE — Progress Notes (Signed)
Patient ID: [name], female   DOB: November 21, 1933, 83 y.o.   MRN: [id number]  SICU Evening Rounds:  Hemodynamically stable but borderline low BP  Atrial paced 70 on amio drip.  No urine output recorded today since foley removed.  Up in chair

## 2017-04-25 NOTE — Progress Notes (Signed)
MD Prescott Gum notified of patient having indigestion and unable to sleep. Order received. Will continue to monitor.  Levon Hedger, RN

## 2017-04-26 ENCOUNTER — Inpatient Hospital Stay (HOSPITAL_COMMUNITY): Payer: Medicare Other

## 2017-04-26 DIAGNOSIS — I361 Nonrheumatic tricuspid (valve) insufficiency: Secondary | ICD-10-CM

## 2017-04-26 LAB — CBC
HCT: 31.4 % — ABNORMAL LOW (ref 36.0–46.0)
Hemoglobin: 10.5 g/dL — ABNORMAL LOW (ref 12.0–15.0)
MCH: 30.3 pg (ref 26.0–34.0)
MCHC: 33.4 g/dL (ref 30.0–36.0)
MCV: 90.5 fL (ref 78.0–100.0)
PLATELETS: 136 10*3/uL — AB (ref 150–400)
RBC: 3.47 MIL/uL — ABNORMAL LOW (ref 3.87–5.11)
RDW: 14.7 % (ref 11.5–15.5)
WBC: 8.7 10*3/uL (ref 4.0–10.5)

## 2017-04-26 LAB — ECHOCARDIOGRAM LIMITED
Height: 66.5 in
Weight: 2275.15 oz

## 2017-04-26 LAB — GLUCOSE, CAPILLARY
GLUCOSE-CAPILLARY: 157 mg/dL — AB (ref 65–99)
GLUCOSE-CAPILLARY: 150 mg/dL — AB (ref 65–99)
GLUCOSE-CAPILLARY: 114 mg/dL — AB (ref 65–99)
Glucose-Capillary: 123 mg/dL — ABNORMAL HIGH (ref 65–99)

## 2017-04-26 LAB — COOXEMETRY PANEL
Carboxyhemoglobin: 0.5 % (ref 0.5–1.5)
Carboxyhemoglobin: 0.9 % (ref 0.5–1.5)
Methemoglobin: 1.2 % (ref 0.0–1.5)
Methemoglobin: 0.8 % (ref 0.0–1.5)
O2 Saturation: 38.7 %
O2 Saturation: 64.4 %
TOTAL HEMOGLOBIN: 10.5 g/dL — AB (ref 12.0–16.0)
Total hemoglobin: 10.2 g/dL — ABNORMAL LOW (ref 12.0–16.0)

## 2017-04-26 LAB — BASIC METABOLIC PANEL
ANION GAP: 12 (ref 5–15)
BUN: 33 mg/dL — AB (ref 6–20)
CALCIUM: 7.9 mg/dL — AB (ref 8.9–10.3)
CO2: 19 mmol/L — ABNORMAL LOW (ref 22–32)
Chloride: 93 mmol/L — ABNORMAL LOW (ref 101–111)
Creatinine, Ser: 1.11 mg/dL — ABNORMAL HIGH (ref 0.44–1.00)
GFR calc Af Amer: 53 mL/min — ABNORMAL LOW (ref >60)
GFR, EST NON AFRICAN AMERICAN: 45 mL/min — AB (ref >60)
GLUCOSE: 262 mg/dL — AB (ref 65–99)
Potassium: 5.6 mmol/L — ABNORMAL HIGH (ref 3.5–5.1)
Sodium: 124 mmol/L — ABNORMAL LOW (ref 135–145)

## 2017-04-26 LAB — PROTIME-INR
INR: 2.9
Prothrombin Time: 30.9 seconds — ABNORMAL HIGH (ref 11.4–15.2)

## 2017-04-26 MED ORDER — FUROSEMIDE 10 MG/ML IJ SOLN
40.0000 mg | Freq: Once | INTRAMUSCULAR | Status: AC
  Administered 2017-04-26: 40 mg via INTRAVENOUS
  Filled 2017-04-26: qty 4

## 2017-04-26 MED ORDER — SODIUM CHLORIDE 0.9 % IV SOLN
INTRAVENOUS | Status: AC
Start: 2017-04-26 — End: 2017-04-27
  Administered 2017-04-26: 14:00:00 via INTRAVENOUS

## 2017-04-26 MED ORDER — ASPIRIN EC 81 MG PO TBEC
81.0000 mg | DELAYED_RELEASE_TABLET | Freq: Every day | ORAL | Status: DC
  Administered 2017-04-26 – 2017-05-08 (×13): 81 mg via ORAL
  Filled 2017-04-26 (×14): qty 1

## 2017-04-26 NOTE — Progress Notes (Signed)
Pts bladder scanned. Pt has 356ML recorded per bladder scanner.

## 2017-04-26 NOTE — Progress Notes (Signed)
[address]       [place], [address]             [phone]        CARDIOTHORACIC SURGERY PROGRESS NOTE   R3 Days Post-Op Procedure(s) (LRB): MITRAL VALVE (MV) REPLACEMENT (N/A) CORONARY ARTERY BYPASS GRAFTING (CABG), ON PUMP, TIMES ONE, USING ENDOSCOPICALLY HARVESTED RIGHT GREATER SAPHENOUS VEIN (N/A) TRANSESOPHAGEAL ECHOCARDIOGRAM (TEE) (N/A) CLIPPING OF LEFT ATRIAL APPENDAGE  Subjective: Looks good and reports feeling better.  Minimal pain.  Denies SOB.  Appetite improved.  Objective: Vital signs: BP Readings from Last 1 Encounters:  04/26/17 (!) 108/25   Pulse Readings from Last 1 Encounters:  04/25/17 68   Resp Readings from Last 1 Encounters:  04/26/17 (!) 30   Temp Readings from Last 1 Encounters:  04/26/17 97.6 F (36.4 C) (Axillary)    Hemodynamics:    Physical Exam:  Rhythm:   Junctional - AV pacing  Breath sounds: clear  Heart sounds:  RRR w/out murmur  Incisions:  Dressings dry, intact  Abdomen:  Soft, non-distended, non-tender  Extremities:  Cool but adequately-perfused    Intake/Output from previous day: 08/16 0701 - 08/17 0700 In: 1238.5 [P.O.:480; I.V.:758.5] Out: 0  Intake/Output this shift: No intake/output data recorded.  Lab Results:  CBC: Recent Labs  04/25/17 0322 04/26/17 0531  WBC 10.6* 8.7  HGB 10.8* 10.5*  HCT 31.8* 31.4*  PLT 164 136*    BMET:  Recent Labs  04/25/17 0322 04/26/17 0531  NA 130* 124*  K 3.5 5.6*  CL 97* 93*  CO2 25 19*  GLUCOSE 61* 262*  BUN 12 33*  CREATININE 0.51 1.11*  CALCIUM 8.1* 7.9*     PT/INR:   Recent Labs  04/26/17 0531  LABPROT 30.9*  INR 2.90    CBG (last 3)   Recent Labs  04/25/17 1512 04/25/17 1616 04/25/17 2228  GLUCAP 150* 150* 134*    ABG    Component Value Date/Time   PHART 7.335 (L) 04/24/2017 0444   PCO2ART 38.2 04/24/2017 0444   PO2ART 68.0 (L) 04/24/2017 0444   HCO3 20.4 04/24/2017 0444   TCO2 23 04/24/2017 1736   ACIDBASEDEF 5.0 (H) 04/24/2017 0444   O2SAT 92.0 04/24/2017 0444    CXR: CHEST  2 VIEW  COMPARISON:  04/25/2017  FINDINGS: Cardiac shadow is within normal limits. Postsurgical changes are noted. Aortic calcifications are seen. Bibasilar atelectatic changes are again noted and stable. Right jugular sheath is noted in place.  IMPRESSION: Bibasilar atelectasis   Electronically Signed   By: Inez Catalina M.D.   On: 04/26/2017 07:15  Assessment/Plan: S/P Procedure(s) (LRB): MITRAL VALVE (MV) REPLACEMENT (N/A) CORONARY ARTERY BYPASS GRAFTING (CABG), ON PUMP, TIMES ONE, USING ENDOSCOPICALLY HARVESTED RIGHT GREATER SAPHENOUS VEIN (N/A) TRANSESOPHAGEAL ECHOCARDIOGRAM (TEE) (N/A) CLIPPING OF LEFT ATRIAL APPENDAGE  Overall doing well POD3 Now in slow junctional rhythm w/ stable but marginal BP Breathing comfortably w/ O2 sats 92-97% on 2 L/min Chronic diastolic CHF with expected post-op volume excess, weight reportedly 12 lbs > preop Minimal UOP since foley d/c'd yesterday, bladder scan w/ low volume Elevated serum creatinine, likely due to prerenal azotemia +/- acute kidney injury caused by ATN Expected post op acute blood loss anemia with iron-deficient anemia preop, Hgb 10.5 stable this morning Expected post op atelectasis, mild Hyponatremia, likely due to free water excess Type II diabetes mellitus, good glycemic control Sudden jump in INR with single dose warfarin   Restart low dose dopamine and  consider gentle IV hydration if oral intake inadequate  Stop amiodarone  Mobilize  Single dose lasix to stimulate UOP  Hold warfarin and decrease ASA  Keep in ICU until renal function improves  [name], MD 04/26/2017 8:17 AM

## 2017-04-26 NOTE — Plan of Care (Signed)
Problem: Activity: Goal: Risk for activity intolerance will decrease Outcome: Progressing Pt ambulated x1 this am to [facility] and to chair.

## 2017-04-26 NOTE — Progress Notes (Signed)
Pt ambulated with moderate assistance to [facility]. Pt has unsteady.  Pt stated "I feel weak".  Pts husband is at bedside. Pt is now resting in bed. Side rails are upx2, bed is in lowest position, call bell within reach.  Pt has [place] on at 2L.  Pt denies any further needs at this time. Will continue to monitor.

## 2017-04-26 NOTE — Progress Notes (Signed)
  Echocardiogram 2D Echocardiogram Limited has been performed.  [name] M 04/26/2017, 3:21 PM

## 2017-04-26 NOTE — Plan of Care (Signed)
Problem: Skin Integrity: Goal: Risk for impaired skin integrity will decrease Outcome: Progressing Pts skin remains intact.

## 2017-04-26 NOTE — Plan of Care (Signed)
Problem: Pain Managment: Goal: General experience of comfort will improve Outcome: Progressing Denies pain  Problem: Activity: Goal: Risk for activity intolerance will decrease Outcome: Progressing Ambulate 2x; OOB to chair  Problem: Fluid Volume: Goal: Ability to maintain a balanced intake and output will improve Outcome: Not Progressing No UOP; bladder scan >382mL; creatinine up to 1.11 from 0.51; patient due to void; no discomfort  Problem: Bowel/Gastric: Goal: Will not experience complications related to bowel motility Outcome: Progressing Passing flatus

## 2017-04-26 NOTE — Progress Notes (Signed)
Informed Dr. Roxy Manns that patient was bladder scanned and informed on results. He stated he was aware and that he wanted to give patient 1L of NS.  Also see orders for ECHO.

## 2017-04-26 NOTE — Progress Notes (Signed)
Patient ID: [name], female   DOB: 1934/02/25, 83 y.o.   MRN: [id number] EVENING ROUNDS NOTE :     [address]       [place] [address]             [phone]                 3 Days Post-Op Procedure(s) (LRB): MITRAL VALVE (MV) REPLACEMENT (N/A) CORONARY ARTERY BYPASS GRAFTING (CABG), ON PUMP, TIMES ONE, USING ENDOSCOPICALLY HARVESTED RIGHT GREATER SAPHENOUS VEIN (N/A) TRANSESOPHAGEAL ECHOCARDIOGRAM (TEE) (N/A) CLIPPING OF LEFT ATRIAL APPENDAGE  Total Length of Stay:  LOS: 3 days  BP 126/69   Pulse 64   Temp 97.8 F (36.6 C) (Oral)   Resp 19   Ht 5' 6.5" (1.689 m)   Wt 142 lb 3.2 oz (64.5 kg)   SpO2 93%   BMI 22.61 kg/m   .Intake/Output      08/16 0701 - 08/17 0700 08/17 0701 - 08/18 0700   P.O. 480    I.V. (mL/kg) 758.5 (11.8) 411.7 (6.4)   Total Intake(mL/kg) 1238.5 (19.2) 411.7 (6.4)   Urine (mL/kg/hr) 0 (0)    Total Output 0     Net +1238.5 +411.7          . sodium chloride 250 mL (04/24/17 0700)  . sodium chloride    . sodium chloride 100 mL/hr at 04/26/17 1700  . DOPamine 3 mcg/kg/min (04/26/17 1600)  . lactated ringers Stopped (04/24/17 0800)  . lactated ringers 10 mL/hr at 04/26/17 1100     Lab Results  Component Value Date   WBC 8.7 04/26/2017   HGB 10.5 (L) 04/26/2017   HCT 31.4 (L) 04/26/2017   PLT 136 (L) 04/26/2017   GLUCOSE 262 (H) 04/26/2017   CHOL 163 08/22/2016   TRIG 101 08/22/2016   HDL 65 08/22/2016   LDLDIRECT 143.6 06/04/2012   LDLCALC 78 08/22/2016   ALT 9 (L) 04/19/2017   AST 20 04/19/2017   NA 124 (L) 04/26/2017   K 5.6 (H) 04/26/2017   CL 93 (L) 04/26/2017   CREATININE 1.11 (H) 04/26/2017   BUN 33 (H) 04/26/2017   CO2 19 (L) 04/26/2017   TSH 0.75 09/20/2014   INR 2.90 04/26/2017   HGBA1C 6.2 (H) 04/19/2017   MICROALBUR 0.2 05/21/2014   Slow progressing    [name] MD  Beeper [phone] Office [phone] 04/26/2017 6:59 PM

## 2017-04-26 NOTE — Plan of Care (Signed)
Problem: Fluid Volume: Goal: Ability to maintain a balanced intake and output will improve Outcome: Progressing Void 1x; 86mL

## 2017-04-26 NOTE — Progress Notes (Signed)
Patient bladder scanned with 275 mL residual.  RN will continue to monitor

## 2017-04-27 ENCOUNTER — Inpatient Hospital Stay (HOSPITAL_COMMUNITY): Payer: Medicare Other

## 2017-04-27 LAB — COOXEMETRY PANEL
CARBOXYHEMOGLOBIN: 0.4 % — AB (ref 0.5–1.5)
Carboxyhemoglobin: 0.9 % (ref 0.5–1.5)
METHEMOGLOBIN: 1.3 % (ref 0.0–1.5)
Methemoglobin: 1.1 % (ref 0.0–1.5)
O2 Saturation: 33.6 %
O2 Saturation: 46.4 %
TOTAL HEMOGLOBIN: 10.9 g/dL — AB (ref 12.0–16.0)
Total hemoglobin: 10.4 g/dL — ABNORMAL LOW (ref 12.0–16.0)

## 2017-04-27 LAB — GLUCOSE, CAPILLARY
GLUCOSE-CAPILLARY: 128 mg/dL — AB (ref 65–99)
Glucose-Capillary: 83 mg/dL (ref 65–99)
Glucose-Capillary: 134 mg/dL — ABNORMAL HIGH (ref 65–99)
Glucose-Capillary: 144 mg/dL — ABNORMAL HIGH (ref 65–99)

## 2017-04-27 LAB — PROTIME-INR
INR: 3.17
Prothrombin Time: 33.2 seconds — ABNORMAL HIGH (ref 11.4–15.2)

## 2017-04-27 LAB — COMPREHENSIVE METABOLIC PANEL
ALT: 4715 U/L — ABNORMAL HIGH (ref 14–54)
ANION GAP: 11 (ref 5–15)
AST: 5718 U/L — ABNORMAL HIGH (ref 15–41)
Albumin: 3.1 g/dL — ABNORMAL LOW (ref 3.5–5.0)
Alkaline Phosphatase: 76 U/L (ref 38–126)
BUN: 34 mg/dL — ABNORMAL HIGH (ref 6–20)
CHLORIDE: 95 mmol/L — AB (ref 101–111)
CO2: 21 mmol/L — AB (ref 22–32)
CREATININE: 1.29 mg/dL — AB (ref 0.44–1.00)
Calcium: 8 mg/dL — ABNORMAL LOW (ref 8.9–10.3)
GFR calc Af Amer: 44 mL/min — ABNORMAL LOW (ref >60)
GFR, EST NON AFRICAN AMERICAN: 38 mL/min — AB (ref >60)
GLUCOSE: 110 mg/dL — AB (ref 65–99)
Potassium: 4.5 mmol/L (ref 3.5–5.1)
Sodium: 127 mmol/L — ABNORMAL LOW (ref 135–145)
Total Bilirubin: 1.2 mg/dL (ref 0.3–1.2)
Total Protein: 5.7 g/dL — ABNORMAL LOW (ref 6.5–8.1)

## 2017-04-27 LAB — CBC
HCT: 31.7 % — ABNORMAL LOW (ref 36.0–46.0)
Hemoglobin: 10.8 g/dL — ABNORMAL LOW (ref 12.0–15.0)
MCH: 30.8 pg (ref 26.0–34.0)
MCHC: 34.1 g/dL (ref 30.0–36.0)
MCV: 90.3 fL (ref 78.0–100.0)
Platelets: 43 10*3/uL — ABNORMAL LOW (ref 150–400)
RBC: 3.51 MIL/uL — ABNORMAL LOW (ref 3.87–5.11)
RDW: 14.8 % (ref 11.5–15.5)
WBC: 6.5 10*3/uL (ref 4.0–10.5)

## 2017-04-27 MED ORDER — MILRINONE LACTATE IN DEXTROSE 20-5 MG/100ML-% IV SOLN
0.3750 ug/kg/min | INTRAVENOUS | Status: DC
  Administered 2017-04-27 (×2): 0.25 ug/kg/min via INTRAVENOUS
  Administered 2017-04-28: 0.375 ug/kg/min via INTRAVENOUS
  Filled 2017-04-27 (×3): qty 100

## 2017-04-27 MED ORDER — DOPAMINE-DEXTROSE 3.2-5 MG/ML-% IV SOLN
4.0000 ug/kg/min | INTRAVENOUS | Status: DC
  Administered 2017-04-27: 4 ug/kg/min via INTRAVENOUS
  Filled 2017-04-27: qty 250

## 2017-04-27 MED ORDER — CHLORHEXIDINE GLUCONATE CLOTH 2 % EX PADS
6.0000 | MEDICATED_PAD | Freq: Every day | CUTANEOUS | Status: DC
  Administered 2017-04-28 – 2017-05-04 (×6): 6 via TOPICAL

## 2017-04-27 MED ORDER — FUROSEMIDE 10 MG/ML IJ SOLN
40.0000 mg | Freq: Once | INTRAMUSCULAR | Status: AC
  Administered 2017-04-27: 40 mg via INTRAVENOUS
  Filled 2017-04-27: qty 4

## 2017-04-27 NOTE — Progress Notes (Signed)
Patient ID: [name], female   DOB: 08/24/1934, 83 y.o.   MRN: [id number] TCTS DAILY ICU PROGRESS NOTE                   [address]            [place] [address]          [phone]   4 Days Post-Op Procedure(s) (LRB): MITRAL VALVE (MV) REPLACEMENT (N/A) CORONARY ARTERY BYPASS GRAFTING (CABG), ON PUMP, TIMES ONE, USING ENDOSCOPICALLY HARVESTED RIGHT GREATER SAPHENOUS VEIN (N/A) TRANSESOPHAGEAL ECHOCARDIOGRAM (TEE) (N/A) CLIPPING OF LEFT ATRIAL APPENDAGE  Total Length of Stay:  LOS: 4 days   Subjective: Patient up to chair, fatigued , but alert and neuro intact   Objective: Vital signs in last 24 hours: Temp:  [97.6 F (36.4 C)-98 F (36.7 C)] 97.7 F (36.5 C) (08/18 0814) Pulse Rate:  [27-121] 95 (08/18 0800) Cardiac Rhythm: Junctional rhythm (08/18 0800) Resp:  [17-36] 22 (08/18 0800) BP: (83-170)/(40-144) 158/77 (08/18 0800) SpO2:  [90 %-100 %] 90 % (08/18 0800) Weight:  [146 lb 9.7 oz (66.5 kg)] 146 lb 9.7 oz (66.5 kg) (08/18 0300)  Filed Weights   04/25/17 0600 04/26/17 0500 04/27/17 0300  Weight: 143 lb 4.8 oz (65 kg) 142 lb 3.2 oz (64.5 kg) 146 lb 9.7 oz (66.5 kg)    Weight change: 4 lb 6.5 oz (2 kg)   Hemodynamic parameters for last 24 hours:    Intake/Output from previous day: 08/17 0701 - 08/18 0700 In: 1189 [I.V.:1189] Out: 525 [Urine:525]  Intake/Output this shift: Total I/O In: 18.3 [I.V.:18.3] Out: -   Current Meds: Scheduled Meds: . acetaminophen  1,000 mg Oral Q6H  . aspirin EC  81 mg Oral Daily  . bisacodyl  10 mg Oral Daily   Or  . bisacodyl  10 mg Rectal Daily  . docusate sodium  200 mg Oral Daily  . insulin aspart  0-24 Units Subcutaneous TID AC & HS  . mouth rinse  15 mL Mouth Rinse BID  . metoCLOPramide (REGLAN) injection  10 mg Intravenous Q6H  . pantoprazole  40 mg Oral Daily  . sodium chloride flush  10-40 mL Intracatheter Q12H  . sodium chloride flush  3 mL Intravenous Q12H  . sodium chloride flush  3  mL Intravenous Q12H  . Warfarin - Physician Dosing Inpatient   Does not apply q1800   Continuous Infusions: . sodium chloride 250 mL (04/24/17 0700)  . sodium chloride    . DOPamine 3 mcg/kg/min (04/27/17 0800)  . lactated ringers Stopped (04/24/17 0800)  . lactated ringers 20 mL/hr at 04/27/17 0600  . milrinone 0.25 mcg/kg/min (04/27/17 0800)   PRN Meds:.sodium chloride, alum & mag hydroxide-simeth, metoprolol tartrate, morphine injection, ondansetron (ZOFRAN) IV, oxyCODONE, sodium chloride flush, sodium chloride flush, traMADol  General appearance: alert, cooperative, appears stated age and no distress Neurologic: intact Heart: irregularly irregular rhythm Lungs: diminished breath sounds bibasilar Abdomen: soft, non-tender; bowel sounds normal; no masses,  no organomegaly Extremities: extremities normal, atraumatic, no cyanosis or edema and Homans sign is negative, no sign of DVT Wound: sternum intact   Lab Results: CBC: Recent Labs  04/26/17 0531 04/27/17 0400  WBC 8.7 6.5  HGB 10.5* 10.8*  HCT 31.4* 31.7*  PLT 136* 43*   BMET:  Recent Labs  04/26/17 0531 04/27/17 0400  NA 124* 127*  K 5.6* 4.5  CL 93* 95*  CO2 19* 21*  GLUCOSE 262* 110*  BUN 33*  34*  CREATININE 1.11* 1.29*  CALCIUM 7.9* 8.0*    CMET: Lab Results  Component Value Date   WBC 6.5 04/27/2017   HGB 10.8 (L) 04/27/2017   HCT 31.7 (L) 04/27/2017   PLT 43 (L) 04/27/2017   GLUCOSE 110 (H) 04/27/2017   CHOL 163 08/22/2016   TRIG 101 08/22/2016   HDL 65 08/22/2016   LDLDIRECT 143.6 06/04/2012   LDLCALC 78 08/22/2016   ALT 4,715 (H) 04/27/2017   AST 5,718 (H) 04/27/2017   NA 127 (L) 04/27/2017   K 4.5 04/27/2017   CL 95 (L) 04/27/2017   CREATININE 1.29 (H) 04/27/2017   BUN 34 (H) 04/27/2017   CO2 21 (L) 04/27/2017   TSH 0.75 09/20/2014   INR 3.17 04/27/2017   HGBA1C 6.2 (H) 04/19/2017   MICROALBUR 0.2 05/21/2014      PT/INR:  Recent Labs  04/27/17 0400  LABPROT 33.2*  INR 3.17     Radiology: Dg Chest Port 1 View  Result Date: 04/27/2017 CLINICAL DATA:  Chest tube EXAM: PORTABLE CHEST 1 VIEW COMPARISON:  Yesterday FINDINGS: Right IJ sheath in stable position. Mildly increased atelectasis with lower lung volumes. Trace pleural effusions. Stable postoperative heart size with changes of mitral valve replacement, CABG, and left atrial clipping. IMPRESSION: Mildly increased atelectasis. Electronically Signed   By: Monte Fantasia M.D.   On: 04/27/2017 07:52   COX  33.6   Assessment/Plan: S/P Procedure(s) (LRB): MITRAL VALVE (MV) REPLACEMENT (N/A) CORONARY ARTERY BYPASS GRAFTING (CABG), ON PUMP, TIMES ONE, USING ENDOSCOPICALLY HARVESTED RIGHT GREATER SAPHENOUS VEIN (N/A) TRANSESOPHAGEAL ECHOCARDIOGRAM (TEE) (N/A) CLIPPING OF LEFT ATRIAL APPENDAGE Thrombocytopenia - check HIT not on heparin  INR still >3 with one dose of coumadin, no coumadin today  milrinone added with low COX this am  Cr up to 1.29, UOP 525 shift on low dose dopamine   [name] 04/27/2017 9:12 AM

## 2017-04-27 NOTE — Progress Notes (Addendum)
Patient ID: [name], female   DOB: [birth year], 83 y.o.   MRN: [id number] EVENING ROUNDS NOTE :     [address]       [place] [address]             [phone]                 4 Days Post-Op Procedure(s) (LRB): MITRAL VALVE (MV) REPLACEMENT (N/A) CORONARY ARTERY BYPASS GRAFTING (CABG), ON PUMP, TIMES ONE, USING ENDOSCOPICALLY HARVESTED RIGHT GREATER SAPHENOUS VEIN (N/A) TRANSESOPHAGEAL ECHOCARDIOGRAM (TEE) (N/A) CLIPPING OF LEFT ATRIAL APPENDAGE  Total Length of Stay:  LOS: 4 days  BP (!) 118/58 (BP Location: Right Arm)   Pulse 94   Temp 97.9 F (36.6 C) (Oral)   Resp (!) 23   Ht 5' 6.5" (1.689 m)   Wt 146 lb 9.7 oz (66.5 kg)   SpO2 95%   BMI 23.31 kg/m   .Intake/Output      08/17 0701 - 08/18 0700 08/18 0701 - 08/19 0700   I.V. (mL/kg) 1189 (17.9) 101.3 (1.5)   Total Intake(mL/kg) 1189 (17.9) 101.3 (1.5)   Urine (mL/kg/hr) 525 (0.3) 650 (0.8)   Total Output 525 650   Net +664 -548.7          . sodium chloride 250 mL (04/24/17 0700)  . sodium chloride    . DOPamine 3 mcg/kg/min (04/27/17 0800)  . lactated ringers Stopped (04/24/17 0800)  . lactated ringers 20 mL/hr at 04/27/17 0600  . milrinone 0.25 mcg/kg/min (04/27/17 1822)     Lab Results  Component Value Date   WBC 6.5 04/27/2017   HGB 10.8 (L) 04/27/2017   HCT 31.7 (L) 04/27/2017   PLT 43 (L) 04/27/2017   GLUCOSE 110 (H) 04/27/2017   CHOL 163 08/22/2016   TRIG 101 08/22/2016   HDL 65 08/22/2016   LDLDIRECT 143.6 06/04/2012   LDLCALC 78 08/22/2016   ALT 4,715 (H) 04/27/2017   AST 5,718 (H) 04/27/2017   NA 127 (L) 04/27/2017   K 4.5 04/27/2017   CL 95 (L) 04/27/2017   CREATININE 1.29 (H) 04/27/2017   BUN 34 (H) 04/27/2017   CO2 21 (L) 04/27/2017   TSH 0.75 09/20/2014   INR 3.17 04/27/2017   HGBA1C 6.2 (H) 04/19/2017   MICROALBUR 0.2 05/21/2014   On milrinone and dopamine  Cox up to 46 this afternoon LFT's very elevated today 5710 was 20 on 8/10 bili ok  HIT pending ,  with low plt count  Cr 1.29  Increase milrinone and dopamine slightly  Place pic line for continued vasoactive infusions   [name] MD  Beeper [phone] Office [phone] 04/27/2017 6:46 PM

## 2017-04-27 NOTE — Plan of Care (Signed)
Problem: Activity: Goal: Risk for activity intolerance will decrease Outcome: Progressing Ambulate 3x; OOB to chair >2h  Problem: Fluid Volume: Goal: Ability to maintain a balanced intake and output will improve Outcome: Progressing UOP up; lasix UOP 950 in 4h

## 2017-04-28 ENCOUNTER — Inpatient Hospital Stay (HOSPITAL_COMMUNITY): Payer: Medicare Other

## 2017-04-28 LAB — CBC
HCT: 32 % — ABNORMAL LOW (ref 36.0–46.0)
Hemoglobin: 10.8 g/dL — ABNORMAL LOW (ref 12.0–15.0)
MCH: 29.8 pg (ref 26.0–34.0)
MCHC: 33.8 g/dL (ref 30.0–36.0)
MCV: 88.2 fL (ref 78.0–100.0)
Platelets: 59 10*3/uL — ABNORMAL LOW (ref 150–400)
RBC: 3.63 MIL/uL — ABNORMAL LOW (ref 3.87–5.11)
RDW: 14.6 % (ref 11.5–15.5)
WBC: 6.5 10*3/uL (ref 4.0–10.5)

## 2017-04-28 LAB — COMPREHENSIVE METABOLIC PANEL
ALT: 2939 U/L — ABNORMAL HIGH (ref 14–54)
AST: 1652 U/L — ABNORMAL HIGH (ref 15–41)
Albumin: 2.8 g/dL — ABNORMAL LOW (ref 3.5–5.0)
Alkaline Phosphatase: 85 U/L (ref 38–126)
Anion gap: 12 (ref 5–15)
BUN: 20 mg/dL (ref 6–20)
CO2: 23 mmol/L (ref 22–32)
Calcium: 7.9 mg/dL — ABNORMAL LOW (ref 8.9–10.3)
Chloride: 97 mmol/L — ABNORMAL LOW (ref 101–111)
Creatinine, Ser: 0.76 mg/dL (ref 0.44–1.00)
GFR calc Af Amer: >60 mL/min (ref >60)
GFR calc non Af Amer: >60 mL/min (ref >60)
Glucose, Bld: 131 mg/dL — ABNORMAL HIGH (ref 65–99)
Potassium: 3.2 mmol/L — ABNORMAL LOW (ref 3.5–5.1)
Sodium: 132 mmol/L — ABNORMAL LOW (ref 135–145)
Total Bilirubin: 1.7 mg/dL — ABNORMAL HIGH (ref 0.3–1.2)
Total Protein: 5.5 g/dL — ABNORMAL LOW (ref 6.5–8.1)

## 2017-04-28 LAB — GLUCOSE, CAPILLARY
GLUCOSE-CAPILLARY: 133 mg/dL — AB (ref 65–99)
Glucose-Capillary: 192 mg/dL — ABNORMAL HIGH (ref 65–99)
Glucose-Capillary: 154 mg/dL — ABNORMAL HIGH (ref 65–99)
Glucose-Capillary: 198 mg/dL — ABNORMAL HIGH (ref 65–99)

## 2017-04-28 LAB — COOXEMETRY PANEL
Carboxyhemoglobin: 1.2 % (ref 0.5–1.5)
Methemoglobin: 1.1 % (ref 0.0–1.5)
O2 Saturation: 55.8 %
Total hemoglobin: 10.8 g/dL — ABNORMAL LOW (ref 12.0–16.0)

## 2017-04-28 LAB — PROTIME-INR
INR: 2.12
PROTHROMBIN TIME: 24.1 s — AB (ref 11.4–15.2)

## 2017-04-28 LAB — MAGNESIUM: Magnesium: 1.4 mg/dL — ABNORMAL LOW (ref 1.7–2.4)

## 2017-04-28 LAB — HEPARIN INDUCED PLATELET AB (HIT ANTIBODY): Heparin Induced Plt Ab: 0.114 OD (ref 0.000–0.400)

## 2017-04-28 MED ORDER — OXYCODONE HCL 5 MG PO TABS: 5.0000 mg | ORAL_TABLET | ORAL | Status: DC | PRN

## 2017-04-28 MED ORDER — MAGNESIUM CHLORIDE 64 MG PO TBEC
1.0000 | DELAYED_RELEASE_TABLET | Freq: Two times a day (BID) | ORAL | Status: AC
  Administered 2017-04-28 – 2017-04-29 (×4): 64 mg via ORAL
  Filled 2017-04-28 (×4): qty 1

## 2017-04-28 MED ORDER — FUROSEMIDE 10 MG/ML IJ SOLN
20.0000 mg | Freq: Once | INTRAMUSCULAR | Status: AC
  Administered 2017-04-28: 20 mg via INTRAVENOUS
  Filled 2017-04-28: qty 2

## 2017-04-28 MED ORDER — SODIUM CHLORIDE 0.9% FLUSH: 10.0000 mL | INTRAVENOUS | Status: DC | PRN

## 2017-04-28 MED ORDER — TRAMADOL HCL 50 MG PO TABS: 50.0000 mg | ORAL_TABLET | ORAL | Status: DC | PRN

## 2017-04-28 MED ORDER — FUROSEMIDE 10 MG/ML IJ SOLN
INTRAMUSCULAR | Status: AC
  Filled 2017-04-28: qty 2

## 2017-04-28 MED ORDER — POTASSIUM CHLORIDE 10 MEQ/50ML IV SOLN
10.0000 meq | INTRAVENOUS | Status: AC
  Administered 2017-04-28 (×3): 10 meq via INTRAVENOUS
  Filled 2017-04-28 (×3): qty 50

## 2017-04-28 MED ORDER — SODIUM CHLORIDE 0.9% FLUSH
10.0000 mL | Freq: Two times a day (BID) | INTRAVENOUS | Status: DC
Start: 2017-04-28 — End: 2017-05-02
  Administered 2017-04-28 – 2017-05-01 (×5): 10 mL

## 2017-04-28 MED ORDER — MILRINONE LACTATE IN DEXTROSE 20-5 MG/100ML-% IV SOLN
0.2000 ug/kg/min | INTRAVENOUS | Status: DC
  Administered 2017-04-28 – 2017-04-30 (×5): 0.375 ug/kg/min via INTRAVENOUS
  Administered 2017-05-01: 0.3 ug/kg/min via INTRAVENOUS
  Administered 2017-05-03: 0.2 ug/kg/min via INTRAVENOUS
  Administered 2017-05-03: 0.3 ug/kg/min via INTRAVENOUS
  Filled 2017-04-28 (×9): qty 100

## 2017-04-28 MED ORDER — DOPAMINE-DEXTROSE 3.2-5 MG/ML-% IV SOLN: 0.0000 ug/kg/min | INTRAVENOUS | Status: DC

## 2017-04-28 MED ORDER — POTASSIUM CHLORIDE CRYS ER 20 MEQ PO TBCR
20.0000 meq | EXTENDED_RELEASE_TABLET | Freq: Once | ORAL | Status: AC
  Administered 2017-04-28: 20 meq via ORAL
  Filled 2017-04-28: qty 1

## 2017-04-28 NOTE — Progress Notes (Signed)
Patient ID: [name], female   DOB: Jan 15, 1934, 83 y.o.   MRN: [id number] TCTS DAILY ICU PROGRESS NOTE                   [address]            [place] [address]          [phone]   5 Days Post-Op Procedure(s) (LRB): MITRAL VALVE (MV) REPLACEMENT (N/A) CORONARY ARTERY BYPASS GRAFTING (CABG), ON PUMP, TIMES ONE, USING ENDOSCOPICALLY HARVESTED RIGHT GREATER SAPHENOUS VEIN (N/A) TRANSESOPHAGEAL ECHOCARDIOGRAM (TEE) (N/A) CLIPPING OF LEFT ATRIAL APPENDAGE  Total Length of Stay:  LOS: 5 days   Subjective: Feels better , says her hands and feet are warmer   Objective: Vital signs in last 24 hours: Temp:  [97.7 F (36.5 C)-98.4 F (36.9 C)] 98.4 F (36.9 C) (08/19 0354) Pulse Rate:  [45-155] 45 (08/19 0700) Cardiac Rhythm: Junctional rhythm (08/19 0400) Resp:  [15-38] 19 (08/19 0700) BP: (98-167)/(45-133) 123/66 (08/19 0700) SpO2:  [90 %-100 %] 99 % (08/19 0700) Weight:  [141 lb 8.6 oz (64.2 kg)] 141 lb 8.6 oz (64.2 kg) (08/19 0500)  Filed Weights   04/26/17 0500 04/27/17 0300 04/28/17 0500  Weight: 142 lb 3.2 oz (64.5 kg) 146 lb 9.7 oz (66.5 kg) 141 lb 8.6 oz (64.2 kg)    Weight change: -5 lb 1.1 oz (-2.3 kg)   Hemodynamic parameters for last 24 hours:    Intake/Output from previous day: 08/18 0701 - 08/19 0700 In: 815.7 [I.V.:715.7; IV Piggyback:100] Out: 2625 [Urine:2625]  Intake/Output this shift: No intake/output data recorded.  Current Meds: Scheduled Meds: . acetaminophen  1,000 mg Oral Q6H  . aspirin EC  81 mg Oral Daily  . bisacodyl  10 mg Oral Daily   Or  . bisacodyl  10 mg Rectal Daily  . Chlorhexidine Gluconate Cloth  6 each Topical Q0600  . docusate sodium  200 mg Oral Daily  . insulin aspart  0-24 Units Subcutaneous TID AC & HS  . mouth rinse  15 mL Mouth Rinse BID  . metoCLOPramide (REGLAN) injection  10 mg Intravenous Q6H  . pantoprazole  40 mg Oral Daily  . sodium chloride flush  10-40 mL Intracatheter Q12H  . sodium  chloride flush  3 mL Intravenous Q12H  . sodium chloride flush  3 mL Intravenous Q12H  . Warfarin - Physician Dosing Inpatient   Does not apply q1800   Continuous Infusions: . sodium chloride 250 mL (04/24/17 0700)  . sodium chloride    . DOPamine 4 mcg/kg/min (04/28/17 0700)  . lactated ringers Stopped (04/24/17 0800)  . lactated ringers 20 mL/hr at 04/28/17 0700  . milrinone 0.375 mcg/kg/min (04/28/17 0700)  . potassium chloride Stopped (04/28/17 0659)   PRN Meds:.sodium chloride, alum & mag hydroxide-simeth, metoprolol tartrate, morphine injection, ondansetron (ZOFRAN) IV, oxyCODONE, sodium chloride flush, sodium chloride flush, traMADol  General appearance: alert and cooperative Neurologic: intact Heart: irregularly irregular rhythm Lungs: diminished breath sounds bibasilar Abdomen: soft, non-tender; bowel sounds normal; no masses,  no organomegaly Extremities: extremities normal, atraumatic, no cyanosis or edema and Homans sign is negative, no sign of DVT Wound: intact  Lab Results: CBC: Recent Labs  04/27/17 0400 04/28/17 0430  WBC 6.5 6.5  HGB 10.8* 10.8*  HCT 31.7* 32.0*  PLT 43* 59*   BMET:  Recent Labs  04/27/17 0400 04/28/17 0430  NA 127* 132*  K 4.5 3.2*  CL 95* 97*  CO2 21* 23  GLUCOSE 110* 131*  BUN 34* 20  CREATININE 1.29* 0.76  CALCIUM 8.0* 7.9*    CMET: Lab Results  Component Value Date   WBC 6.5 04/28/2017   HGB 10.8 (L) 04/28/2017   HCT 32.0 (L) 04/28/2017   PLT 59 (L) 04/28/2017   GLUCOSE 131 (H) 04/28/2017   CHOL 163 08/22/2016   TRIG 101 08/22/2016   HDL 65 08/22/2016   LDLDIRECT 143.6 06/04/2012   LDLCALC 78 08/22/2016   ALT 2,939 (H) 04/28/2017   AST 1,652 (H) 04/28/2017   NA 132 (L) 04/28/2017   K 3.2 (L) 04/28/2017   CL 97 (L) 04/28/2017   CREATININE 0.76 04/28/2017   BUN 20 04/28/2017   CO2 23 04/28/2017   TSH 0.75 09/20/2014   INR 2.12 04/28/2017   HGBA1C 6.2 (H) 04/19/2017   MICROALBUR 0.2 05/21/2014       PT/INR:  Recent Labs  04/28/17 0430  LABPROT 24.1*  INR 2.12   Radiology: Dg Chest Port 1 View  Result Date: 04/28/2017 CLINICAL DATA:  Chest tube placement. EXAM: PORTABLE CHEST 1 VIEW COMPARISON:  April 27, 2017 FINDINGS: Stable support apparatus. Mild atelectasis in the right base. Probable tiny effusion and atelectasis in the left base. No other interval changes or acute abnormalities. IMPRESSION: No significant interval change in the small left effusion and bibasilar atelectasis. Electronically Signed   By: Dorise Bullion III M.D   On: 04/28/2017 07:17   COX 55.8    Assessment/Plan: S/P Procedure(s) (LRB): MITRAL VALVE (MV) REPLACEMENT (N/A) CORONARY ARTERY BYPASS GRAFTING (CABG), ON PUMP, TIMES ONE, USING ENDOSCOPICALLY HARVESTED RIGHT GREATER SAPHENOUS VEIN (N/A) TRANSESOPHAGEAL ECHOCARDIOGRAM (TEE) (N/A) CLIPPING OF LEFT ATRIAL APPENDAGE LFT dropped by 1/2 since yesterday , ? Recovering "shock liver" Cox up to 55 now - continue current milrinone and dopamine  plts increasing HIT result pending  Urine output improved 2625 past 24 hours  PIC line to be placed and d/c central line  Walked around the unit this am kcl replaced , check Chapman Moss 04/28/2017 7:53 AM

## 2017-04-28 NOTE — Progress Notes (Signed)
Peripherally Inserted Central Catheter/Midline Placement  The IV Nurse has discussed with the patient and/or persons authorized to consent for the patient, the purpose of this procedure and the potential benefits and risks involved with this procedure.  The benefits include less needle sticks, lab draws from the catheter, and the patient may be discharged home with the catheter. Risks include, but not limited to, infection, bleeding, blood clot (thrombus formation), and puncture of an artery; nerve damage and irregular heartbeat and possibility to perform a PICC exchange if needed/ordered by physician.  Alternatives to this procedure were also discussed.  B[employer] Power PICC patient education guide, fact sheet on infection prevention and patient information card has been provided to patient /or left at bedside.    PICC/Midline Placement Documentation        [name] 04/28/2017, 2:43 PM

## 2017-04-28 NOTE — Progress Notes (Signed)
Patient ID: [name], female   DOB: 1934/02/19, 83 y.o.   MRN: [id number] EVENING ROUNDS NOTE :     [address]       [place] [address]             [phone]                 5 Days Post-Op Procedure(s) (LRB): MITRAL VALVE (MV) REPLACEMENT (N/A) CORONARY ARTERY BYPASS GRAFTING (CABG), ON PUMP, TIMES ONE, USING ENDOSCOPICALLY HARVESTED RIGHT GREATER SAPHENOUS VEIN (N/A) TRANSESOPHAGEAL ECHOCARDIOGRAM (TEE) (N/A) CLIPPING OF LEFT ATRIAL APPENDAGE  Total Length of Stay:  LOS: 5 days  BP 128/72   Pulse (!) 115   Temp 99 F (37.2 C) (Oral)   Resp (!) 26   Ht 5' 6.5" (1.689 m)   Wt 141 lb 8.6 oz (64.2 kg)   SpO2 99%   BMI 22.50 kg/m   .Intake/Output      08/18 0701 - 08/19 0700 08/19 0701 - 08/20 0700   P.O. 0 240   I.V. (mL/kg) 715.7 (11.1) 101 (1.6)   IV Piggyback 100    Total Intake(mL/kg) 815.7 (12.7) 341 (5.3)   Urine (mL/kg/hr) 2625 (1.7)    Total Output 2625     Net -1809.3 +341        Urine Occurrence  2 x     . sodium chloride 250 mL (04/24/17 0700)  . sodium chloride    . DOPamine 4 mcg/kg/min (04/28/17 0800)  . lactated ringers Stopped (04/24/17 0800)  . lactated ringers 20 mL/hr at 04/28/17 0700  . milrinone 0.375 mcg/kg/min (04/28/17 0947)     Lab Results  Component Value Date   WBC 6.5 04/28/2017   HGB 10.8 (L) 04/28/2017   HCT 32.0 (L) 04/28/2017   PLT 59 (L) 04/28/2017   GLUCOSE 131 (H) 04/28/2017   CHOL 163 08/22/2016   TRIG 101 08/22/2016   HDL 65 08/22/2016   LDLDIRECT 143.6 06/04/2012   LDLCALC 78 08/22/2016   ALT 2,939 (H) 04/28/2017   AST 1,652 (H) 04/28/2017   NA 132 (L) 04/28/2017   K 3.2 (L) 04/28/2017   CL 97 (L) 04/28/2017   CREATININE 0.76 04/28/2017   BUN 20 04/28/2017   CO2 23 04/28/2017   TSH 0.75 09/20/2014   INR 2.12 04/28/2017   HGBA1C 6.2 (H) 04/19/2017   MICROALBUR 0.2 05/21/2014   Stable day, central line out  pic line in Follow up lfts  And protime in am   [name]  MD  Beeper [phone] Office [phone] 04/28/2017 5:41 PM

## 2017-04-28 NOTE — Plan of Care (Signed)
Problem: Pain Managment: Goal: General experience of comfort will improve Outcome: Progressing Denies pain  Problem: Tissue Perfusion: Goal: Risk factors for ineffective tissue perfusion will decrease Outcome: Progressing Coox improved; hands feet warmer; cap refill <3seconds  Problem: Activity: Goal: Risk for activity intolerance will decrease Outcome: Progressing Ambulate 3x; oob in chair >2h  Problem: Fluid Volume: Goal: Ability to maintain a balanced intake and output will improve Outcome: Progressing UO good  Problem: Nutrition: Goal: Adequate nutrition will be maintained Outcome: Progressing Improved appetite  Problem: Bowel/Gastric: Goal: Will not experience complications related to bowel motility Outcome: Progressing BM today

## 2017-04-28 NOTE — Progress Notes (Signed)
PT Cancellation Note  Patient Details Name: [name] MRN: [id number] DOB: 12-20-1933   Cancelled Treatment:    Reason Eval/Treat Not Completed: Other (comment) Pt declined mobility at this time, " I have been up all morning," Per RN, she has walked twice today and lunch just arrived. Will follow up.   [name] 04/28/2017, 1:16 PM Wray Kearns, [place], DPT [phone]

## 2017-04-29 ENCOUNTER — Inpatient Hospital Stay (HOSPITAL_COMMUNITY): Payer: Medicare Other

## 2017-04-29 DIAGNOSIS — Z951 Presence of aortocoronary bypass graft: Secondary | ICD-10-CM

## 2017-04-29 DIAGNOSIS — Z953 Presence of xenogenic heart valve: Secondary | ICD-10-CM

## 2017-04-29 DIAGNOSIS — I480 Paroxysmal atrial fibrillation: Secondary | ICD-10-CM

## 2017-04-29 DIAGNOSIS — I5021 Acute systolic (congestive) heart failure: Secondary | ICD-10-CM

## 2017-04-29 LAB — COMPREHENSIVE METABOLIC PANEL
ALT: 2151 U/L — ABNORMAL HIGH (ref 14–54)
AST: 767 U/L — ABNORMAL HIGH (ref 15–41)
Albumin: 2.8 g/dL — ABNORMAL LOW (ref 3.5–5.0)
Alkaline Phosphatase: 85 U/L (ref 38–126)
Anion gap: 5 (ref 5–15)
BUN: 12 mg/dL (ref 6–20)
CO2: 28 mmol/L (ref 22–32)
Calcium: 7.9 mg/dL — ABNORMAL LOW (ref 8.9–10.3)
Chloride: 100 mmol/L — ABNORMAL LOW (ref 101–111)
Creatinine, Ser: 0.58 mg/dL (ref 0.44–1.00)
GFR calc Af Amer: >60 mL/min (ref >60)
GFR calc non Af Amer: >60 mL/min (ref >60)
Glucose, Bld: 117 mg/dL — ABNORMAL HIGH (ref 65–99)
Potassium: 3.5 mmol/L (ref 3.5–5.1)
Sodium: 133 mmol/L — ABNORMAL LOW (ref 135–145)
Total Bilirubin: 1.4 mg/dL — ABNORMAL HIGH (ref 0.3–1.2)
Total Protein: 5.2 g/dL — ABNORMAL LOW (ref 6.5–8.1)

## 2017-04-29 LAB — PROTIME-INR
INR: 1.55
PROTHROMBIN TIME: 18.8 s — AB (ref 11.4–15.2)

## 2017-04-29 LAB — GLUCOSE, CAPILLARY
GLUCOSE-CAPILLARY: 138 mg/dL — AB (ref 65–99)
GLUCOSE-CAPILLARY: 143 mg/dL — AB (ref 65–99)
Glucose-Capillary: 141 mg/dL — ABNORMAL HIGH (ref 65–99)
Glucose-Capillary: 206 mg/dL — ABNORMAL HIGH (ref 65–99)
Glucose-Capillary: 137 mg/dL — ABNORMAL HIGH (ref 65–99)

## 2017-04-29 LAB — CBC
HCT: 28.3 % — ABNORMAL LOW (ref 36.0–46.0)
Hemoglobin: 9.6 g/dL — ABNORMAL LOW (ref 12.0–15.0)
MCH: 30.2 pg (ref 26.0–34.0)
MCHC: 33.9 g/dL (ref 30.0–36.0)
MCV: 89 fL (ref 78.0–100.0)
Platelets: 56 10*3/uL — ABNORMAL LOW (ref 150–400)
RBC: 3.18 MIL/uL — ABNORMAL LOW (ref 3.87–5.11)
RDW: 14.7 % (ref 11.5–15.5)
WBC: 6 10*3/uL (ref 4.0–10.5)

## 2017-04-29 LAB — COOXEMETRY PANEL
CARBOXYHEMOGLOBIN: 0.8 % (ref 0.5–1.5)
Methemoglobin: 1.3 % (ref 0.0–1.5)
O2 Saturation: 64 %
Total hemoglobin: 10 g/dL — ABNORMAL LOW (ref 12.0–16.0)

## 2017-04-29 LAB — MAGNESIUM: Magnesium: 1.2 mg/dL — ABNORMAL LOW (ref 1.7–2.4)

## 2017-04-29 MED ORDER — POTASSIUM CHLORIDE 10 MEQ/50ML IV SOLN
10.0000 meq | INTRAVENOUS | Status: AC
  Filled 2017-04-29: qty 50

## 2017-04-29 MED ORDER — MAGNESIUM SULFATE 4 GM/100ML IV SOLN
4.0000 g | Freq: Once | INTRAVENOUS | Status: AC
  Administered 2017-04-29: 4 g via INTRAVENOUS
  Filled 2017-04-29: qty 100

## 2017-04-29 MED ORDER — POTASSIUM CHLORIDE 10 MEQ/50ML IV SOLN
10.0000 meq | INTRAVENOUS | Status: AC
  Administered 2017-04-29 (×2): 10 meq via INTRAVENOUS

## 2017-04-29 MED ORDER — POTASSIUM CHLORIDE 10 MEQ/50ML IV SOLN
10.0000 meq | INTRAVENOUS | Status: AC
  Administered 2017-04-29 (×3): 10 meq via INTRAVENOUS
  Filled 2017-04-29 (×2): qty 50

## 2017-04-29 MED ORDER — AMIODARONE HCL IN DEXTROSE 360-4.14 MG/200ML-% IV SOLN
30.0000 mg/h | INTRAVENOUS | Status: DC
  Administered 2017-04-29 – 2017-05-02 (×6): 30 mg/h via INTRAVENOUS
  Filled 2017-04-29 (×6): qty 200

## 2017-04-29 MED ORDER — WARFARIN SODIUM 1 MG PO TABS
1.0000 mg | ORAL_TABLET | Freq: Every day | ORAL | Status: DC
  Administered 2017-04-29 – 2017-05-01 (×3): 1 mg via ORAL
  Filled 2017-04-29 (×3): qty 1

## 2017-04-29 MED ORDER — MILRINONE LACTATE IN DEXTROSE 20-5 MG/100ML-% IV SOLN: 0.2500 ug/kg/min | INTRAVENOUS | Status: DC

## 2017-04-29 MED ORDER — CLONAZEPAM 0.5 MG PO TABS: 0.5000 mg | ORAL_TABLET | Freq: Every evening | ORAL | Status: DC | PRN

## 2017-04-29 MED ORDER — AMIODARONE HCL IN DEXTROSE 360-4.14 MG/200ML-% IV SOLN
60.0000 mg/h | INTRAVENOUS | Status: AC
  Administered 2017-04-29 (×2): 60 mg/h via INTRAVENOUS
  Filled 2017-04-29: qty 400

## 2017-04-29 MED ORDER — AMIODARONE LOAD VIA INFUSION
150.0000 mg | Freq: Once | INTRAVENOUS | Status: AC
  Administered 2017-04-29: 150 mg via INTRAVENOUS
  Filled 2017-04-29: qty 83.34

## 2017-04-29 MED ORDER — FUROSEMIDE 10 MG/ML IJ SOLN
40.0000 mg | Freq: Two times a day (BID) | INTRAMUSCULAR | Status: AC
  Administered 2017-04-29 – 2017-05-01 (×5): 40 mg via INTRAVENOUS
  Filled 2017-04-29 (×5): qty 4

## 2017-04-29 NOTE — Progress Notes (Signed)
      [address]       [place] [address]             [phone]      POD # 6 MVR CABG x 1  Walked 4 x today  BP (!) 141/68   Pulse 96   Temp 99.3 F (37.4 C) (Oral)   Resp (!) 23   Ht 5' 6.5" (1.689 m)   Wt 140 lb 3.4 oz (63.6 kg)   SpO2 95%   BMI 22.29 kg/m    Intake/Output Summary (Last 24 hours) at 04/29/17 1748 Last data filed at 04/29/17 1700  Gross per 24 hour  Intake          1165.47 ml  Output              250 ml  Net           915.47 ml   Urine not accurately recorded but has had a large amount of urine  Remo Lipps C. Roxan Hockey, MD [facility] [phone]

## 2017-04-29 NOTE — Evaluation (Signed)
Physical Therapy Evaluation Patient Details Name: JAHAIRA EARNHART MRN: [id number] DOB: 1934-04-18 Today's Date: 04/29/2017   History of Present Illness  YARIS FERRELL is a 83 y.o. female with h/o MVP, HTN, DM2, scoliosis, and CAD. She had developed DOE over the past 2-3 months along with occasional peripheral edema. TEE 03/04/17 showed severe MR and severe LAE. L/RHC 03/28/17 with severe multivessel disease. Pt underwent CABG x 1 and MVR replacement 04/23/17.   Clinical Impression  Pt admitted with above diagnosis. Pt currently with functional limitations due to the deficits listed below (see PT Problem List). Pt was able to ambulate in hallway with RW with min assist and cues.  Pt incontinence is limiting pt a bit.  Will follow acutely and progress as able.  Husband supportive but can only provide supervision.  Pt will benefit from skilled PT to increase their independence and safety with mobility to allow discharge to the venue listed below.    Follow Up Recommendations Home health PT;Supervision/Assistance - 24 hour    Equipment Recommendations  3in1 (PT)    Recommendations for Other Services       Precautions / Restrictions Precautions Precautions: Fall;Sternal Restrictions Weight Bearing Restrictions: No      Mobility  Bed Mobility Overal bed mobility: Needs Assistance Bed Mobility: Rolling;Sidelying to Sit Rolling: Min assist Sidelying to sit: Mod assist       General bed mobility comments: Pt needed assist due to sternal precautions  Transfers Overall transfer level: Needs assistance Equipment used: Rolling walker (2 wheeled) Transfers: Sit to/from Stand Sit to Stand: Min assist;Mod assist         General transfer comment: Needed assist to power up to maintain sternal precautions.  STeadying assist once up.   Ambulation/Gait Ambulation/Gait assistance: Min assist Ambulation Distance (Feet): 170 Feet Assistive device: Rolling walker (2 wheeled) Gait  Pattern/deviations: Step-through pattern;Decreased stride length;Drifts right/left   Gait velocity interpretation: Below normal speed for age/gender General Gait Details: Pt was able to ambulate in hallway with RW with cues to stay close to RW and steer RW at times.    Stairs            Wheelchair Mobility    Modified Rankin (Stroke Patients Only)       Balance Overall balance assessment: Needs assistance;History of Falls Sitting-balance support: No upper extremity supported;Feet supported Sitting balance-Leahy Scale: Good     Standing balance support: Bilateral upper extremity supported;During functional activity Standing balance-Leahy Scale: Poor Standing balance comment: relies on UE support for standing with RW                             Pertinent Vitals/Pain Pain Assessment: No/denies pain  96-120 bpm, 90-94% on RA with activity.  BP stable   Home Living Family/patient expects to be discharged to:: Private residence Living Arrangements: Spouse/significant other Available Help at Discharge: Family;Available 24 hours/day (husband can provide supervision only) Type of Home: House Home Access: Stairs to enter Entrance Stairs-Rails: Right;Left;Can reach both Entrance Stairs-Number of Steps: 5 Home Layout: One level Home Equipment: Cane - single point;Walker - 2 wheels;Walker - standard;Shower seat;Grab bars - toilet;Grab bars - tub/shower;Hand held shower head      Prior Function Level of Independence: Independent               Hand Dominance   Dominant Hand: Left    Extremity/Trunk Assessment   Upper Extremity Assessment Upper Extremity Assessment: Defer to  OT evaluation    Lower Extremity Assessment Lower Extremity Assessment: Generalized weakness    Cervical / Trunk Assessment Cervical / Trunk Assessment: Kyphotic  Communication   Communication: HOH (deaf left ear)  Cognition Arousal/Alertness: Awake/alert Behavior During  Therapy: Anxious Overall Cognitive Status: Within Functional Limits for tasks assessed                                 General Comments: Pt upset because she is incontinent due to meds.  She was not incontinent PTA.       General Comments General comments (skin integrity, edema, etc.): Pt incontinent of urine on arrival and changed all linens and mesh panties.  On standing, pt incontinent again and had to change pt again.  Pt was able to ambulate unit after being changed the second time.      Exercises     Assessment/Plan    PT Assessment Patient needs continued PT services  PT Problem List Decreased strength;Decreased activity tolerance;Decreased balance;Decreased mobility;Decreased knowledge of use of DME;Decreased safety awareness;Decreased knowledge of precautions;Cardiopulmonary status limiting activity       PT Treatment Interventions DME instruction;Gait training;Stair training;Functional mobility training;Therapeutic activities;Therapeutic exercise;Balance training;Patient/family education    PT Goals (Current goals can be found in the Care Plan section)  Acute Rehab PT Goals Patient Stated Goal: to go home PT Goal Formulation: With patient Time For Goal Achievement: 05/13/17 Potential to Achieve Goals: Good    Frequency Min 3X/week   Barriers to discharge        Co-evaluation               AM-PAC PT "6 Clicks" Daily Activity  Outcome Measure Difficulty turning over in bed (including adjusting bedclothes, sheets and blankets)?: Unable Difficulty moving from lying on back to sitting on the side of the bed? : Unable Difficulty sitting down on and standing up from a chair with arms (e.g., wheelchair, bedside commode, etc,.)?: Unable Help needed moving to and from a bed to chair (including a wheelchair)?: A Lot Help needed walking in hospital room?: A Lot Help needed climbing 3-5 steps with a railing? : Total 6 Click Score: 8    End of Session  Equipment Utilized During Treatment: Gait belt Activity Tolerance: Patient limited by fatigue Patient left: in chair;with call bell/phone within reach;with nursing/sitter in room Nurse Communication: Mobility status PT Visit Diagnosis: Unsteadiness on feet (R26.81);Muscle weakness (generalized) (M62.81)    Time: 4627-0350 PT Time Calculation (min) (ACUTE ONLY): 35 min   Charges:   PT Evaluation $PT Eval Moderate Complexity: 1 Mod PT Treatments $Gait Training: 8-22 mins   PT G Codes:        Ousmane Seeman,PT Acute Rehabilitation [phone] (pager)   Denice Paradise 04/29/2017, 2:08 PM

## 2017-04-29 NOTE — Plan of Care (Signed)
Problem: Pain Managment: Goal: General experience of comfort will improve Outcome: Progressing Denies pain  Problem: Skin Integrity: Goal: Risk for impaired skin integrity will decrease Outcome: Progressing No skin breakdown; increased activity/perfusion  Problem: Activity: Goal: Risk for activity intolerance will decrease Outcome: Progressing Ambulate x4; OOB to chair  Problem: Nutrition: Goal: Adequate nutrition will be maintained Outcome: Progressing Appetite better; no nausea

## 2017-04-29 NOTE — Progress Notes (Signed)
Occupational Therapy Evaluation Patient Details Name: [name] MRN: [id number] DOB: 1933/10/30 Today's Date: 04/29/2017    History of Present Illness [name] is a 83 y.o. female with h/o MVP, HTN, DM2, scoliosis, and CAD. She had developed DOE over the past 2-3 months along with occasional peripheral edema. TEE 03/04/17 showed severe MR and severe LAE. L/RHC 03/28/17 with severe multivessel disease. Pt underwent CABG x 1 and MVR replacement 04/23/17.    Clinical Impression   Pt admitted with above. She demonstrates the below listed deficits and will benefit from continued OT to maximize safety and independence with BADLs.  Pt presents to OT with generalized weakness, impaired balance, pain, decreased activity tolerance.  She lives with spouse and was independent PTA.  Anticipate good progress.  Will follow acutely.        Follow Up Recommendations  No OT follow up;Supervision/Assistance - 24 hour    Equipment Recommendations  3 in 1 bedside commode    Recommendations for Other Services       Precautions / Restrictions Precautions Precautions: Fall;Sternal      Mobility Bed Mobility Overal bed mobility: Needs Assistance Bed Mobility: Sit to Sidelying         Sit to sidelying: Mod assist General bed mobility comments: assist with LEs and assist to straighten trunk in the bed   Transfers Overall transfer level: Needs assistance Equipment used: Rolling walker (2 wheeled) Transfers: Sit to/from [facility] Sit to Stand: Mod assist Stand pivot transfers: Min assist       General transfer comment: Requires assist to power up into standing, and assist to steady     Balance Overall balance assessment: Needs assistance;History of Falls Sitting-balance support: No upper extremity supported;Feet supported Sitting balance-Leahy Scale: Good     Standing balance support: Bilateral upper extremity supported;During functional activity Standing  balance-Leahy Scale: Poor Standing balance comment: reliant on UE support                            ADL either performed or assessed with clinical judgement   ADL Overall ADL's : Needs assistance/impaired Eating/Feeding: Independent   Grooming: Wash/dry hands;Wash/dry face;Oral care;Brushing hair;Set up;Sitting   Upper Body Bathing: Minimal assistance;Sitting   Lower Body Bathing: Maximal assistance;Sit to/from stand   Upper Body Dressing : Minimal assistance;Sitting   Lower Body Dressing: Total assistance;Sit to/from stand   Toilet Transfer: Moderate assistance;Ambulation;Comfort height toilet;BSC;RW   Toileting- Clothing Manipulation and Hygiene: Maximal assistance;Sit to/from stand       Functional mobility during ADLs: Moderate assistance;Minimal assistance;Rolling walker General ADL Comments: Pt limited by fatigue, weakness, and dicomfort      Vision         Perception     Praxis      Pertinent Vitals/Pain Pain Assessment: Faces Faces Pain Scale: Hurts little more Pain Location: back  Pain Descriptors / Indicators: Aching Pain Intervention(s): Monitored during session;Repositioned     Hand Dominance Left   Extremity/Trunk Assessment Upper Extremity Assessment Upper Extremity Assessment: Generalized weakness   Lower Extremity Assessment Lower Extremity Assessment: Defer to PT evaluation   Cervical / Trunk Assessment Cervical / Trunk Assessment: Kyphotic   Communication Communication Communication: HOH (deaf in Lt ear )   Cognition Arousal/Alertness: Awake/alert Behavior During Therapy: Flat affect Overall Cognitive Status: Within Functional Limits for tasks assessed  General Comments  VSS     Exercises     Shoulder Instructions      Home Living Family/patient expects to be discharged to:: Private residence Living Arrangements: Spouse/significant other Available Help at  Discharge: Family;Available 24 hours/day (husband can provide supervision only) Type of Home: House Home Access: Stairs to enter [employer] of Steps: 5 Entrance Stairs-Rails: Right;Left;Can reach both Home Layout: One level     Bathroom Shower/Tub: Teacher, early years/pre: Standard     Home Equipment: Cane - single point;Walker - 2 wheels;Walker - standard;Shower seat;Grab bars - toilet;Grab bars - tub/shower;Hand held shower head          Prior Functioning/Environment Level of Independence: Independent                 OT Problem List: Decreased strength;Decreased activity tolerance;Impaired balance (sitting and/or standing);Decreased knowledge of use of DME or AE;Decreased safety awareness;Decreased knowledge of precautions;Cardiopulmonary status limiting activity;Pain      OT Treatment/Interventions: Self-care/ADL training;DME and/or AE instruction;Therapeutic activities;Patient/family education;Balance training    OT Goals(Current goals can be found in the care plan section) Acute Rehab OT Goals Patient Stated Goal: To be able to cook again  OT Goal Formulation: With patient Time For Goal Achievement: 05/13/17 Potential to Achieve Goals: Good ADL Goals Pt Will Perform Grooming: with min guard assist;standing Pt Will Perform Upper Body Bathing: with set-up;with supervision;sitting Pt Will Perform Lower Body Bathing: with min guard assist;sit to/from stand Pt Will Perform Upper Body Dressing: with supervision;with set-up;sitting Pt Will Perform Lower Body Dressing: with min guard assist;sit to/from stand;with adaptive equipment Pt Will Transfer to Toilet: with min guard assist;ambulating;regular height toilet;bedside commode;grab bars Pt Will Perform Toileting - Clothing Manipulation and hygiene: with min guard assist;sit to/from stand Pt Will Perform Tub/Shower Transfer: Shower transfer;Tub transfer;with min assist;ambulating;shower seat;rolling  walker  OT Frequency: Min 2X/week   Barriers to D/C:            Co-evaluation              AM-PAC PT "6 Clicks" Daily Activity     Outcome Measure Help from another person eating meals?: None Help from another person taking care of personal grooming?: A Little Help from another person toileting, which includes using toliet, bedpan, or urinal?: A Lot Help from another person bathing (including washing, rinsing, drying)?: A Lot Help from another person to put on and taking off regular upper body clothing?: A Lot Help from another person to put on and taking off regular lower body clothing?: Total 6 Click Score: 14   End of Session Equipment Utilized During Treatment: Rolling walker Nurse Communication: Mobility status  Activity Tolerance: Patient tolerated treatment well Patient left: in bed;with call bell/phone within reach;with family/visitor present  OT Visit Diagnosis: Unsteadiness on feet (R26.81)                Time: 1638-4536 OT Time Calculation (min): 27 min Charges:  OT General Charges $OT Visit: 1 Procedure OT Evaluation $OT Eval Moderate Complexity: 1 Procedure OT Treatments $Therapeutic Activity: 8-22 mins G-Codes:     [facility], OTR/L [phone]   Misbah Hornaday M 04/29/2017, 4:06 PM

## 2017-04-29 NOTE — Progress Notes (Signed)
Patient ambulated in hallway with front wheel walker 160 ft on room air, patient tolerated walk well, patient back to bed, call bell in reach, husband at bedside, will continue to monitor.  Rowe Pavy, RN

## 2017-04-29 NOTE — Consult Note (Signed)
Advanced Heart Failure Team Consult Note   Primary Physician: Dr. Elease Hashimoto Primary Cardiologist:  Dr. Harrington Challenger Primary HF: New  Reason for Consultation: Acute systolic Heart Failure  HPI:    [name] is seen today for evaluation of heart failure at the request of Dr. Roxy Manns.   [name] is a 83 y.o. female with h/o MVP, HTN, DM2, scoliosis, and CAD. She had developed DOE over the past 2-3 months along with occasional peripheral edema. TEE 03/04/17 showed severe MR and severe LAE. L/RHC 03/28/17 with severe multivessel disease. Pt was referred for surgical consultation by Dr. Roxy Manns.   Pt underwent CABG x 1 and MVR replacement 04/23/17. Milrinone added 04/27/17 with Mixed venous saturation of 33%. HF team consulted for assistance.   Coox 64.0% this am on milrinone 0.375 mcg/kg/min. Creatinine 0.58 and K 3.5. Mg 1.2. LFTs markedly elevated.  Weight remains 10 lbs up from pre-op.   Feeling slightly better than she did yesterday. Walked halls twice this am without SOB or lightheadedness/dizziness. Just fatigued. Denies CP. Does not feel palpitations with Afib.   Echo 04/26/17 LVEF 25-30%, Stable MVR, Moderate TR, Peak PA pressure 38 mm, RV moderately reduced function.  Echo TEE 03/04/17 LVEF normal, Mild MVP with regurgitant jets, predominant jet posteriorly, Severe LAE.  R/LHC 03/28/17  1. Severe single-vessel coronary artery disease with 60% proximal and 99% distal RCA stenoses as well as chronic total occlusion of the ostial rPDA. The rPDA andr PL branches are supplied by left-to-right collaterals. 2. 70% proximal D2 stenosis is evident. Otherwise, there is mild, nonobstructive disease involving the LAD, LCx, and their branches. 3. Normal left and right heart filling pressures. Normal pulmonary artery pressure. Normal Fick cardiac output/index. RHC Procedural Findings: Hemodynamics (mmHg) RA mean 4 RV 30/2 PA 28/8 PCWP 7 AO 107/45 Cardiac Output (Fick) 4.67 Cardiac Index (Fick)  2.78  Review of Systems: [y] = yes, '[ ]'  = no   General: Weight gain [y]; Weight loss '[ ]' ; Anorexia '[ ]' ; Fatigue [y]; Fever '[ ]' ; Chills '[ ]' ; Weakness [y]  Cardiac: Chest pain/pressure '[ ]' ; Resting SOB '[ ]' ; Exertional SOB '[ ]' ; Orthopnea '[ ]' ; Pedal Edema [y]; Palpitations '[ ]' ; Syncope '[ ]' ; Presyncope '[ ]' ; Paroxysmal nocturnal dyspnea'[ ]'   Pulmonary: Cough '[ ]' ; Wheezing'[ ]' ; Hemoptysis'[ ]' ; Sputum '[ ]' ; Snoring '[ ]'   GI: Vomiting'[ ]' ; Dysphagia'[ ]' ; Melena'[ ]' ; Hematochezia '[ ]' ; Heartburn'[ ]' ; Abdominal pain '[ ]' ; Constipation '[ ]' ; Diarrhea '[ ]' ; BRBPR '[ ]'   GU: Hematuria'[ ]' ; Dysuria '[ ]' ; Nocturia'[ ]'   Vascular: Pain in legs with walking '[ ]' ; Pain in feet with lying flat '[ ]' ; Non-healing sores '[ ]' ; Stroke '[ ]' ; TIA '[ ]' ; Slurred speech '[ ]' ;  Neuro: Headaches'[ ]' ; Vertigo'[ ]' ; Seizures'[ ]' ; Paresthesias'[ ]' ;Blurred vision '[ ]' ; Diplopia '[ ]' ; Vision changes '[ ]'   Ortho/Skin: Arthritis [y]; Joint pain [y]; Muscle pain '[ ]' ; Joint swelling '[ ]' ; Back Pain '[ ]' ; Rash '[ ]'   Psych: Depression'[ ]' ; Anxiety'[ ]'   Heme: Bleeding problems '[ ]' ; Clotting disorders '[ ]' ; Anemia '[ ]'   Endocrine: Diabetes '[ ]' ; Thyroid dysfunction'[ ]'   Home Medications Prior to Admission medications   Medication Sig Start Date End Date Taking? Authorizing Provider  bisacodyl (DULCOLAX) 5 MG EC tablet Take 10 mg by mouth daily as needed (for constipation.).   Yes [provider]  ciprofloxacin (CIPRO) 500 MG tablet Take 1 tablet (500 mg total) by mouth 2 (two) times daily. 04/22/17  Yes [name], MD  citalopram (CELEXA) 20 MG tablet Take 1 tablet (20 mg total) by mouth daily. 02/06/17  Yes Burchette, Alinda Sierras, MD  clonazePAM (KLONOPIN) 1 MG tablet Take one po qhs prn insomnia and severe anxiety. May refill on or after Jan 09 2016. Patient taking differently: Take 1 mg by mouth at bedtime. May refill on or after Jan 09 2016. 02/06/17  Yes Burchette, Alinda Sierras, MD  conjugated estrogens (PREMARIN) vaginal cream Place 1 Applicatorful vaginally  daily. Patient taking differently: Place 1 Applicatorful vaginally every 14 (fourteen) days.  02/06/17  Yes Burchette, Alinda Sierras, MD  diltiazem (DILACOR XR) 240 MG 24 hr capsule Take 1 capsule (240 mg total) by mouth daily. 02/15/17  Yes Fay Records, MD  esomeprazole (NEXIUM) 20 MG capsule Take 20 mg by mouth daily at 12 noon.   Yes [provider]  famotidine (PEPCID) 20 MG tablet Take 20 mg by mouth daily as needed for heartburn or indigestion.   Yes [provider]  fexofenadine (ALLEGRA) 180 MG tablet Take 90 mg by mouth daily as needed for allergies or rhinitis (allergy headache).   Yes [provider]  fluticasone (FLONASE) 50 MCG/ACT nasal spray SHAKE LIQUID AND USE 2 SPRAYS IN EACH NOSTRIL DAILY AS NEEDED FOR ALLERGIES 09/14/16  Yes Burchette, Alinda Sierras, MD  ibuprofen (ADVIL,MOTRIN) 200 MG tablet Take 400 mg by mouth daily as needed for headache or mild pain.    Yes [provider]  lisinopril-hydrochlorothiazide (PRINZIDE,ZESTORETIC) 10-12.5 MG tablet Take 1 tablet by mouth daily. 02/15/17  Yes Fay Records, MD  metFORMIN (GLUCOPHAGE) 500 MG tablet Take 2 tablets (1,000 mg total) by mouth 2 (two) times daily with a meal. 03/31/17  Yes End, Harrell Gave, MD  Multiple Vitamins-Minerals (ICAPS AREDS 2) CAPS Take 1 capsule by mouth 2 (two) times daily.   Yes [provider]  Probiotic Product (PROBIOTIC DAILY PO) Take 2 capsules by mouth daily.    Yes [provider]  trimethoprim (TRIMPEX) 100 MG tablet TAKE 1 TABLET(100 MG) BY MOUTH DAILY Patient taking differently: TAKE 1 TABLET(100 MG) BY MOUTH AT BEDTIME 10/29/16  Yes Burchette, Alinda Sierras, MD  ONETOUCH DELICA LANCETS 69V MISC USE TO TEST TWICE DAILY AS DIRECTED 11/13/16   Burchette, Alinda Sierras, MD  ONETOUCH VERIO test strip USE TO CHECK BLOOD GLUCOSE TWICE DAILY AS DIRECTED 05/30/16   Burchette, Alinda Sierras, MD  rivaroxaban (XARELTO) 20 MG TABS tablet Take 1 tablet (20 mg total) by mouth daily with supper.  Restart day after catheterization if no bleeding or swelling at catheterization site. Patient not taking: Reported on 04/22/2017 03/29/17   Nelva Bush, MD    Past Medical History: Past Medical History:  Diagnosis Date  . Allergy   . Anxiety   . Arthritis   . Atrial enlargement, left   . CHF (congestive heart failure) ([place])   . Coronary artery disease   . Depression   . Diabetes mellitus without complication (HCC)    fasting cbg 110s  . Dyspnea   . GERD (gastroesophageal reflux disease)   . H/O hiatal hernia   . Headache(784.0)   . Heart murmur    06/10/2014 seeing new cardiologist  . Heart murmur   . HOH (hard of hearing)    HOH in left ear; needs to speak to patient in right ear  . Hyperlipidemia   . Hypertension   . Mitral regurgitation   . Mitral valve prolapse   . Palpitations  afib on 03/04/17 EKG  . PONV (postoperative nausea and vomiting)    Patient stated "I like the patch behind my ear"  . 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 S/P mitral valve replacement with bioprosthetic valve 04/23/2017   [facility] Mitral bovine pericardial tissue valve: Model 7300TFX, Serial [id number], size 31  . UTI (lower urinary tract infection)     Past Surgical History: Past Surgical History:  Procedure Laterality Date  . ABDOMINAL HYSTERECTOMY    . Anterior posterior and enterocele repairs  09/21/2004   With uterosacral cardinal colposuspension, partial colpocleisis; Selinda Orion, MD  . BACK SURGERY    . BREAST ENHANCEMENT SURGERY  02/25/2002   Bilateral reduction and excision of accessory breast tissue underneath the left breast; Aretha Parrot., MD  . BREAST SURGERY  11   reduction  . CARDIAC CATHETERIZATION     no PCI  . CATARACT EXTRACTION Bilateral   . CLIPPING OF ATRIAL APPENDAGE  04/23/2017   Procedure: CLIPPING OF LEFT ATRIAL APPENDAGE;  Surgeon: [name], MD;  Location: [place];  Service: Open Heart Surgery;;  .  COLONOSCOPY    . CORONARY ARTERY BYPASS GRAFT N/A 04/23/2017   Procedure: CORONARY ARTERY BYPASS GRAFTING (CABG), ON PUMP, TIMES ONE, USING ENDOSCOPICALLY HARVESTED RIGHT GREATER SAPHENOUS VEIN;  Surgeon: [name], MD;  Location: [place];  Service: Open Heart Surgery;  Laterality: N/A;  . EYE SURGERY Bilateral    cataract removal  . LUMBAR LAMINECTOMY/ DECOMPRESSION WITH MET-RX N/A 03/16/2014   Procedure: LUMBAR FIVE-SACRAL ONE EXTRAFORAMINAL DISKECTOMY WITH METREX;  Surgeon: Kristeen Miss, MD;  Location: [place] NEURO ORS;  Service: Neurosurgery;  Laterality: N/A;  . LUMBAR LAMINECTOMY/DECOMPRESSION MICRODISCECTOMY Left 06/08/2014   Procedure: Left Lumbar Five-Sacral One Microdiskectomy;  Surgeon: Kristeen Miss, MD;  Location: [place] NEURO ORS;  Service: Neurosurgery;  Laterality: Left;  Left L5-S1 Microdiskectomy  . MITRAL VALVE REPLACEMENT N/A 04/23/2017   Procedure: MITRAL VALVE (MV) REPLACEMENT;  Surgeon: [name], MD;  Location: [place];  Service: Open Heart Surgery;  Laterality: N/A;  . PELVIC FLOOR REPAIR    . POSTERIOR LUMBAR FUSION 4 LEVEL N/A 08/08/2015   Procedure: T8-L1 posterior lateral fusion with decompression T12-L1;  Surgeon: Kristeen Miss, MD;  Location: [place] [facility]UR[facility];  Service: Neurosurgery;  Laterality: N/A;  T8-L1 posterior lateral fusion with decompression T12-L1  . RIGHT/LEFT HEART CATH AND CORONARY ANGIOGRAPHY N/A 03/28/2017   Procedure: Right/Left Heart Cath and Coronary Angiography;  Surgeon: Nelva Bush, MD;  Location: [place] CV LAB;  Service: Cardiovascular;  Laterality: N/A;  . ROTATOR CUFF REPAIR Bilateral    11.12  . TEE WITHOUT CARDIOVERSION N/A 03/04/2017   Procedure: TRANSESOPHAGEAL ECHOCARDIOGRAM (TEE);  Surgeon: Fay Records, MD;  Location: [place];  Service: Cardiovascular;  Laterality: N/A;  . TEE WITHOUT CARDIOVERSION N/A 04/23/2017   Procedure: TRANSESOPHAGEAL ECHOCARDIOGRAM (TEE);  Surgeon: [name], MD;  Location: [place];  Service: Open  Heart Surgery;  Laterality: N/A;  . TONSILLECTOMY AND ADENOIDECTOMY      Family History: Family History  Problem Relation Age of Onset  . Heart failure Father   . Arthritis Father   . Hyperlipidemia Father   . Heart disease Father   . Arthritis Mother   . Hyperlipidemia Mother   . Hypertension Mother     Social History: Social History   Social History  . Marital status: Married    Spouse name:  N/A  . Number of children: N/A  . Years of education: N/A   Occupational History  . Retired Retired   Social History Main Topics  . Smoking status: Never Smoker  . Smokeless tobacco: Never Used  . Alcohol use No  . Drug use: No  . Sexual activity: Not Asked   Other Topics Concern  . None   Social History Narrative   Married   Regular exercise    Allergies:  Allergies  Allergen Reactions  . Cortisone Other (See Comments)    Insomnia, heart palpitations (po only)  . Statins Other (See Comments)    Myalgias   . Codeine Swelling    FACIAL SWELLING SEVERITY UNKNOWN  . Sulfonamide Derivatives     UNSPECIFIED REACTION   . Lactose Intolerance (Gi) Nausea And Vomiting    Objective:    Vital Signs:   Temp:  [98.2 F (36.8 C)-99.5 F (37.5 C)] 98.6 F (37 C) (08/20 0807) Pulse Rate:  [25-115] 97 (08/20 0800) Resp:  [18-34] 20 (08/20 0800) BP: (83-143)/(51-84) 117/71 (08/20 0800) SpO2:  [95 %-100 %] 100 % (08/20 0800) Weight:  [140 lb 3.4 oz (63.6 kg)] 140 lb 3.4 oz (63.6 kg) (08/20 0353) Last BM Date: 04/28/17  Weight change: Filed Weights   04/27/17 0300 04/28/17 0500 04/29/17 0353  Weight: 146 lb 9.7 oz (66.5 kg) 141 lb 8.6 oz (64.2 kg) 140 lb 3.4 oz (63.6 kg)    Intake/Output:   Intake/Output Summary (Last 24 hours) at 04/29/17 0370 Last data filed at 04/29/17 0800  Gross per 24 hour  Intake           698.06 ml  Output              450 ml  Net           248.06 ml      Physical Exam    General:  Elderly and fatigued appearing.  HEENT:  normal Neck: supple. JVP ~10 cm. Carotids 2+ bilat; no bruits. No lymphadenopathy or thyromegaly appreciated. Cor: PMI nondisplaced. Irregularly irregular.  Lungs: Mildly diminished basilar sounds.  Abdomen: Soft, NT, mild distention, No hepatosplenomegaly. No bruits or masses. Good bowel sounds. Extremities: no cyanosis, clubbing, or rash. Trace ankle edema Neuro: alert & orientedx3, cranial nerves grossly intact. moves all 4 extremities w/o difficulty. Affect pleasant  Telemetry   Personally reviewed, Afib 110-120s  EKG    04/28/17 Personally reviewed, Afib 115 bpm  Labs   Basic Metabolic Panel:  Recent Labs Lab 04/23/17 1900  04/24/17 0448 04/24/17 1729  04/25/17 0322 04/26/17 0531 04/27/17 0400 04/28/17 0430 04/28/17 0935 04/29/17 0410  NA  --   < > 131*  --   < > 130* 124* 127* 132*  --  133*  K  --   < > 3.8  --   < > 3.5 5.6* 4.5 3.2*  --  3.5  CL  --   < > 101  --   < > 97* 93* 95* 97*  --  100*  CO2  --   < > 21*  --   --  25 19* 21* 23  --  28  GLUCOSE  --   < > 106*  --   < > 61* 262* 110* 131*  --  117*  BUN  --   < > 10  --   < > 12 33* 34* 20  --  12  CREATININE 0.56  < > 0.52 0.61  < >  0.51 1.11* 1.29* 0.76  --  0.58  CALCIUM  --   < > 7.6*  --   --  8.1* 7.9* 8.0* 7.9*  --  7.9*  MG 2.9*  --  1.7 1.4*  --   --   --   --   --  1.4* 1.2*  < > = values in this interval not displayed.  Liver Function Tests:  Recent Labs Lab 04/27/17 0400 04/28/17 0430 04/29/17 0410  AST 5,718* 1,652* 767*  ALT 4,715* 2,939* 2,151*  ALKPHOS 76 85 85  BILITOT 1.2 1.7* 1.4*  PROT 5.7* 5.5* 5.2*  ALBUMIN 3.1* 2.8* 2.8*   No results for input(s): LIPASE, AMYLASE in the last 168 hours. No results for input(s): AMMONIA in the last 168 hours.  CBC:  Recent Labs Lab 04/25/17 0322 04/26/17 0531 04/27/17 0400 04/28/17 0430 04/29/17 0410  WBC 10.6* 8.7 6.5 6.5 6.0  HGB 10.8* 10.5* 10.8* 10.8* 9.6*  HCT 31.8* 31.4* 31.7* 32.0* 28.3*  MCV 89.1 90.5 90.3 88.2 89.0   PLT 164 136* 43* 59* 56*    Cardiac Enzymes: No results for input(s): CKTOTAL, CKMB, CKMBINDEX, TROPONINI in the last 168 hours.  BNP: BNP (last 3 results) No results for input(s): BNP in the last 8760 hours.  ProBNP (last 3 results) No results for input(s): PROBNP in the last 8760 hours.   CBG:  Recent Labs Lab 04/28/17 0816 04/28/17 1147 04/28/17 1633 04/28/17 2116 04/29/17 0804  GLUCAP 133* 192* 154* 198* 138*    Coagulation Studies:  Recent Labs  04/27/17 0400 04/28/17 0430 04/29/17 0410  LABPROT 33.2* 24.1* 18.8*  INR 3.17 2.12 1.55     Imaging   Dg Chest Port 1 View  Result Date: 04/29/2017 CLINICAL DATA:  Patient with chest pain. EXAM: PORTABLE CHEST 1 VIEW COMPARISON:  Chest radiograph 04/28/2017 FINDINGS: Right upper extremity PICC line is present with tip projecting over the superior vena cava. Monitoring leads overlie the patient. Interval removal right IJ sheath. Stable enlarged cardiac and mediastinal contours status post median sternotomy. Persistent elevation of the right hemidiaphragm. Probable small left pleural effusion. Left lower hemithorax excluded from view. No pneumothorax. Minimal heterogeneous opacities lung bases bilaterally. IMPRESSION: Right upper extremity PICC line tip projects over the superior vena cava. Left lower hemithorax excluded from view. Probable small left pleural effusion and bibasilar atelectasis. Electronically Signed   By: Lovey Newcomer M.D.   On: 04/29/2017 09:15   Dg Chest Port 1 View  Result Date: 04/28/2017 CLINICAL DATA:  Central catheter placement EXAM: PORTABLE CHEST 1 VIEW COMPARISON:  Study obtained earlier in the day FINDINGS: Right subclavian catheter tip is in the superior vena cava near the cavoatrial junction. Cordis tip is in the superior vena cava more proximally. There is no appreciable pneumothorax. There is bibasilar atelectatic change with small pleural effusion on the left. Heart is upper normal in size  with pulmonary vascularity within normal limits. Patient is status post coronary artery bypass grafting. There is a mitral valve replacement as well as left atrial appendage clamp present. Heart size and pulmonary vascularity are normal. No evident adenopathy. There is aortic atherosclerosis. There is multilevel screw and plate fixation in the lower thoracic and lumbar regions. IMPRESSION: Catheter positions as described without pneumothorax. Small left pleural effusion with bibasilar atelectasis. Stable cardiac silhouette. There is aortic atherosclerosis. Aortic Atherosclerosis (ICD10-I70.0). Electronically Signed   By: Lowella Grip III M.D.   On: 04/28/2017 15:04      Medications:  Current Medications: . aspirin EC  81 mg Oral Daily  . bisacodyl  10 mg Oral Daily   Or  . bisacodyl  10 mg Rectal Daily  . Chlorhexidine Gluconate Cloth  6 each Topical Q0600  . docusate sodium  200 mg Oral Daily  . furosemide  40 mg Intravenous BID  . insulin aspart  0-24 Units Subcutaneous TID AC & HS  . magnesium chloride  1 tablet Oral BID  . mouth rinse  15 mL Mouth Rinse BID  . pantoprazole  40 mg Oral Daily  . sodium chloride flush  10-40 mL Intracatheter Q12H  . sodium chloride flush  10-40 mL Intracatheter Q12H  . sodium chloride flush  3 mL Intravenous Q12H  . sodium chloride flush  3 mL Intravenous Q12H  . warfarin  1 mg Oral q1800  . Warfarin - Physician Dosing Inpatient   Does not apply q1800     Infusions: . sodium chloride 250 mL (04/24/17 0700)  . sodium chloride    . DOPamine 3 mcg/kg/min (04/29/17 0816)  . lactated ringers Stopped (04/24/17 0800)  . lactated ringers 20 mL/hr at 04/29/17 0800  . milrinone 0.375 mcg/kg/min (04/29/17 0900)  . potassium chloride         Patient Profile   [name] is a 83 y.o. female with h/o MVP, HTN, DM2, scoliosis, and CAD. She had developed DOE over the past 2-3 months along with occasional peripheral edema  S/p MVR and CABG x  1 04/23/17. HF consulted post op with low mixed venous saturation and volume overload.   Assessment/Plan   1. Acute combined biventricular CHF - New drop in right and left EF post op. EF25-30% with RV dyfunction - Coox 64% on milrinone 0.325 mcg/kg/min.  - Wean dopamine as tolerated with Afib. Supp electrolytes - Continue IV lasix 40 BID for now.  - Have asked for CVP to be hooked up.  2. CAD s/p CABG - Stable post op.  3. Severe MR s/p MVR 04/23/17 - With bovine bioprosthetic valve and LA clipping.  - Stable on post op echo 04/26/17 4. Shock liver vs Passive hepatic congestion. - Trending down with milrinone and diuresis.  - Restarting amio. Follow closely.  5. Afib RVR - New, post op.  - Would wean dopamine as tolerated.  - Amiodarone stopped with transaminitis. Will cautiously restart and follow LFTs.  - This patients CHA2DS2-VASc is at least 7. Continue coumadin.  6. Hypokalemia/Hypomagnesemia - Will supp aggressively today.  7. Post op anemia - Hgb 9.6 this am. Continue to follow.   Restart Amiodarone. Continue IV lasix and check CVP. If does not convert on IV amiodarone, will need DCCV +/- TEE later this week.   Length of Stay: [address]  [name]  04/29/2017, 9:37 AM  Advanced Heart Failure Team Pager [phone] (M-F; 7a - 4p)  Please contact [facility] for night-coverage after hours (4p -7a ) and weekends on [email or web address]  Patient seen and examined with the above-signed Advanced Practice Provider and/or Housestaff. I personally reviewed laboratory data, imaging studies and relevant notes. I independently examined the patient and formulated the important aspects of the plan. I have edited the note to reflect any of my changes or salient points. I have personally discussed the plan with the patient and/or family.  83 y/o as above with h/o MV prolapse with severe MR. Now s/p MVR with CABG. Post-op has developed LV dysfunction with EF 25-30% with RV failure (echo  viewed  personally) and AF. She has had low output with co-ox 33%. Now improving on dopamine and milrinone.   Agree with weaning dopamine. Continue milrinone for now (will need slow wean). Restart amio for AF. Will follow CVP and co-ox closely.   AHF team will follow.   Glori Bickers, MD  3:38 PM

## 2017-04-29 NOTE — Progress Notes (Addendum)
      [address]       [place] [address]             [phone]        CARDIOTHORACIC SURGERY PROGRESS NOTE   R6 Days Post-Op Procedure(s) (LRB): MITRAL VALVE (MV) REPLACEMENT (N/A) CORONARY ARTERY BYPASS GRAFTING (CABG), ON PUMP, TIMES ONE, USING ENDOSCOPICALLY HARVESTED RIGHT GREATER SAPHENOUS VEIN (N/A) TRANSESOPHAGEAL ECHOCARDIOGRAM (TEE) (N/A) CLIPPING OF LEFT ATRIAL APPENDAGE  Subjective: Looks good and reports feeling better.  No SOB.  Hungry for breakfast  Objective: Vital signs: BP Readings from Last 1 Encounters:  04/29/17 134/63   Pulse Readings from Last 1 Encounters:  04/29/17 70   Resp Readings from Last 1 Encounters:  04/29/17 (!) 22   Temp Readings from Last 1 Encounters:  04/29/17 98.4 F (36.9 C) (Oral)    Hemodynamics:   Mixed venous co-ox 64%  Physical Exam:  Rhythm:   Afib  Breath sounds: clear  Heart sounds:  irregular  Incisions:  Clean and dry  Abdomen:  Soft, non-distended, non-tender  Extremities:  Warm, well-perfused    Intake/Output from previous day: 08/19 0701 - 08/20 0700 In: 940.5 [P.O.:240; I.V.:650.5; IV Piggyback:50] Out: 450 [Urine:450] Intake/Output this shift: No intake/output data recorded.  Lab Results:  CBC: Recent Labs  04/28/17 0430 04/29/17 0410  WBC 6.5 6.0  HGB 10.8* 9.6*  HCT 32.0* 28.3*  PLT 59* 56*    BMET:  Recent Labs  04/28/17 0430 04/29/17 0410  NA 132* 133*  K 3.2* 3.5  CL 97* 100*  CO2 23 28  GLUCOSE 131* 117*  BUN 20 12  CREATININE 0.76 0.58  CALCIUM 7.9* 7.9*     PT/INR:   Recent Labs  04/29/17 0410  LABPROT 18.8*  INR 1.55    CBG (last 3)   Recent Labs  04/28/17 1147 04/28/17 1633 04/28/17 2116  GLUCAP 192* 154* 198*    ABG    Component Value Date/Time   PHART 7.335 (L) 04/24/2017 0444   PCO2ART 38.2 04/24/2017 0444   PO2ART 68.0 (L) 04/24/2017 0444   HCO3 20.4 04/24/2017 0444   TCO2 23 04/24/2017 1736   ACIDBASEDEF 5.0 (H)  04/24/2017 0444   O2SAT 64.0 04/29/2017 0420    CXR: Looks good, remarkably clear  Assessment/Plan: S/P Procedure(s) (LRB): MITRAL VALVE (MV) REPLACEMENT (N/A) CORONARY ARTERY BYPASS GRAFTING (CABG), ON PUMP, TIMES ONE, USING ENDOSCOPICALLY HARVESTED RIGHT GREATER SAPHENOUS VEIN (N/A) TRANSESOPHAGEAL ECHOCARDIOGRAM (TEE) (N/A) CLIPPING OF LEFT ATRIAL APPENDAGE  Overall stable, slowly improving Remains in Afib w/ HR 100-120, stable BP, co-ox 64% this morning on dopamine 4 milrinone 0.375 Breathing comfortably w/ O2 sats 98-100% on 2 L/min Acute on chronic combined systolic and diastolic CHF with significant right heart failure - improved on milrinone, I/O's slightly positive yesterday, weight still 10 lbs > preop Expected post op acute blood loss anemia, Hgb 9.6 this morning Post op thrombocytopenia, platelet count 56k this morning, stable Expected post op atelectasis, mild Hypokalemia, induced by loop diuretics Type II diabetes mellitus, adequate glycemic control Elevated liver enzymes, likely due to right heart failure w/ hepatic congestion, trending down, amiodarone stopped   Mobilize  Diuresis  Wean dopamine slowly  Continue milrinone 0.375 for now  Restart warfarin at reduced dose  Supplement potassium  Consult w/ advanced heart failure team to assist w/ management   [name], MD 04/29/2017 7:51 AM

## 2017-04-30 LAB — COOXEMETRY PANEL
CARBOXYHEMOGLOBIN: 1.8 % — AB (ref 0.5–1.5)
Methemoglobin: 1 % (ref 0.0–1.5)
O2 Saturation: 61.7 %
Total hemoglobin: 9.4 g/dL — ABNORMAL LOW (ref 12.0–16.0)

## 2017-04-30 LAB — COMPREHENSIVE METABOLIC PANEL
ALK PHOS: 89 U/L (ref 38–126)
ALT: 1809 U/L — AB (ref 14–54)
ANION GAP: 9 (ref 5–15)
AST: 481 U/L — ABNORMAL HIGH (ref 15–41)
Albumin: 2.7 g/dL — ABNORMAL LOW (ref 3.5–5.0)
BILIRUBIN TOTAL: 1.5 mg/dL — AB (ref 0.3–1.2)
BUN: 12 mg/dL (ref 6–20)
CALCIUM: 7.9 mg/dL — AB (ref 8.9–10.3)
CO2: 27 mmol/L (ref 22–32)
CREATININE: 0.59 mg/dL (ref 0.44–1.00)
Chloride: 94 mmol/L — ABNORMAL LOW (ref 101–111)
GFR calc non Af Amer: >60 mL/min (ref >60)
Glucose, Bld: 118 mg/dL — ABNORMAL HIGH (ref 65–99)
Potassium: 3.3 mmol/L — ABNORMAL LOW (ref 3.5–5.1)
Sodium: 130 mmol/L — ABNORMAL LOW (ref 135–145)
TOTAL PROTEIN: 5.4 g/dL — AB (ref 6.5–8.1)

## 2017-04-30 LAB — GLUCOSE, CAPILLARY
GLUCOSE-CAPILLARY: 127 mg/dL — AB (ref 65–99)
GLUCOSE-CAPILLARY: 184 mg/dL — AB (ref 65–99)
Glucose-Capillary: 234 mg/dL — ABNORMAL HIGH (ref 65–99)
Glucose-Capillary: 103 mg/dL — ABNORMAL HIGH (ref 65–99)

## 2017-04-30 LAB — PROTIME-INR
INR: 1.24
Prothrombin Time: 15.7 seconds — ABNORMAL HIGH (ref 11.4–15.2)

## 2017-04-30 LAB — CBC
HEMATOCRIT: 27.3 % — AB (ref 36.0–46.0)
HEMOGLOBIN: 9.2 g/dL — AB (ref 12.0–15.0)
MCH: 30.3 pg (ref 26.0–34.0)
MCHC: 33.7 g/dL (ref 30.0–36.0)
MCV: 89.8 fL (ref 78.0–100.0)
Platelets: 70 10*3/uL — ABNORMAL LOW (ref 150–400)
RBC: 3.04 MIL/uL — AB (ref 3.87–5.11)
RDW: 15.3 % (ref 11.5–15.5)
WBC: 7 10*3/uL (ref 4.0–10.5)

## 2017-04-30 LAB — POCT I-STAT 4, (NA,K, GLUC, HGB,HCT)
GLUCOSE: 96 mg/dL (ref 65–99)
HEMATOCRIT: 25 % — AB (ref 36.0–46.0)
HEMOGLOBIN: 8.5 g/dL — AB (ref 12.0–15.0)
POTASSIUM: 3.2 mmol/L — AB (ref 3.5–5.1)
Sodium: 135 mmol/L (ref 135–145)

## 2017-04-30 LAB — MAGNESIUM: MAGNESIUM: 1.7 mg/dL (ref 1.7–2.4)

## 2017-04-30 MED ORDER — POTASSIUM CHLORIDE 20 MEQ PO PACK
40.0000 meq | PACK | Freq: Once | ORAL | Status: AC
  Administered 2017-04-30: 40 meq via ORAL
  Filled 2017-04-30: qty 2

## 2017-04-30 MED ORDER — POTASSIUM CHLORIDE CRYS ER 20 MEQ PO TBCR
40.0000 meq | EXTENDED_RELEASE_TABLET | Freq: Once | ORAL | Status: DC
  Filled 2017-04-30: qty 2

## 2017-04-30 MED ORDER — POTASSIUM CHLORIDE 10 MEQ/50ML IV SOLN: 10.0000 meq | INTRAVENOUS | Status: DC | PRN

## 2017-04-30 MED ORDER — POTASSIUM CHLORIDE 10 MEQ/50ML IV SOLN
10.0000 meq | INTRAVENOUS | Status: DC | PRN
Start: 2017-04-30 — End: 2017-05-05
  Administered 2017-04-30 (×2): 10 meq via INTRAVENOUS
  Filled 2017-04-30 (×3): qty 50

## 2017-04-30 MED ORDER — POTASSIUM CHLORIDE 10 MEQ/50ML IV SOLN
INTRAVENOUS | Status: AC
  Filled 2017-04-30: qty 50

## 2017-04-30 MED ORDER — POTASSIUM CHLORIDE 10 MEQ/50ML IV SOLN
10.0000 meq | INTRAVENOUS | Status: AC
  Administered 2017-04-30 (×3): 10 meq via INTRAVENOUS
  Filled 2017-04-30 (×2): qty 50

## 2017-04-30 MED ORDER — SPIRONOLACTONE 25 MG PO TABS
12.5000 mg | ORAL_TABLET | Freq: Every day | ORAL | Status: DC
  Administered 2017-04-30: 12.5 mg via ORAL
  Filled 2017-04-30: qty 1

## 2017-04-30 MED ORDER — MAGNESIUM SULFATE 4 GM/100ML IV SOLN
4.0000 g | Freq: Once | INTRAVENOUS | Status: AC
  Administered 2017-04-30: 4 g via INTRAVENOUS
  Filled 2017-04-30: qty 100

## 2017-04-30 NOTE — Care Management Note (Addendum)
Case Management Note  Patient Details  Name: [name] MRN: [id number] Date of Birth: 1934/05/01  Subjective/Objective:    From home with spouse, POD 1 CABG, MVR, wean drips, dc tubes today or tomorrow depending on output.  8/21 [place], BSN -POD 7 CABG, MVR, afib , hyponatremia, conts on amio, milrinone, lasix iv, cvp low, co - ox 62%.  Per pt eval rec HHPT , 3 n 1.   NCM left [facility] agency list with spouse and patient, they will look over and NCM will check back with them tomorrow, they state they have a 3 n 1 already.   8/22 [place], BSN- NCM spoke with patient in room they have a check beside [facility], referral made to [facility], awaiting call back to see if they can take her for HHPT. [name] state they can take her at [place] [facility] for [place].  Will need HHPT order with face to face.             Action/Plan: NCM will cont to follow for dc needs.   Expected Discharge Date:                  Expected Discharge Plan:     In-House Referral:     Discharge planning Services  CM Consult  Post Acute Care Choice:    Choice offered to:     DME Arranged:    DME Agency:     HH Arranged:    HH Agency:     Status of Service:  In process, will continue to follow  If discussed at Long Length of Stay Meetings, dates discussed:    Additional Comments:  [name], RN 04/30/2017, 2:59 PM

## 2017-04-30 NOTE — Progress Notes (Addendum)
TCTS DAILY ICU PROGRESS NOTE                   [address]            [place] [address]          [phone]   7 Days Post-Op Procedure(s) (LRB): MITRAL VALVE (MV) REPLACEMENT (N/A) CORONARY ARTERY BYPASS GRAFTING (CABG), ON PUMP, TIMES ONE, USING ENDOSCOPICALLY HARVESTED RIGHT GREATER SAPHENOUS VEIN (N/A) TRANSESOPHAGEAL ECHOCARDIOGRAM (TEE) (N/A) CLIPPING OF LEFT ATRIAL APPENDAGE  Total Length of Stay:  LOS: 7 days   Subjective: Patient states already walked this am. She is feeling a little better this am. She is eagerly awaiting breakfast.  Objective: Vital signs in last 24 hours: Temp:  [97.8 F (36.6 C)-99.3 F (37.4 C)] 98.4 F (36.9 C) (08/21 0753) Pulse Rate:  [26-129] 89 (08/21 0753) Cardiac Rhythm: Atrial fibrillation (08/21 0400) Resp:  [18-27] 23 (08/21 0753) BP: (87-145)/(49-90) 142/58 (08/21 0600) SpO2:  [85 %-100 %] 98 % (08/21 0753) Weight:  [63.3 kg (139 lb 8.8 oz)] 63.3 kg (139 lb 8.8 oz) (08/21 0600)  Filed Weights   04/28/17 0500 04/29/17 0353 04/30/17 0600  Weight: 64.2 kg (141 lb 8.6 oz) 63.6 kg (140 lb 3.4 oz) 63.3 kg (139 lb 8.8 oz)    Weight change: -0.3 kg (-10.6 oz)   Hemodynamic parameters for last 24 hours: CVP:  [0 mmHg-7 mmHg] 7 mmHg  Intake/Output from previous day: 08/20 0701 - 08/21 0700 In: 1297.8 [I.V.:1097.8; IV Piggyback:200] Out: 600 [Urine:600]  Intake/Output this shift: No intake/output data recorded.  Current Meds: Scheduled Meds: . aspirin EC  81 mg Oral Daily  . bisacodyl  10 mg Oral Daily   Or  . bisacodyl  10 mg Rectal Daily  . Chlorhexidine Gluconate Cloth  6 each Topical Q0600  . docusate sodium  200 mg Oral Daily  . furosemide  40 mg Intravenous BID  . insulin aspart  0-24 Units Subcutaneous TID AC & HS  . mouth rinse  15 mL Mouth Rinse BID  . pantoprazole  40 mg Oral Daily  . potassium chloride  40 mEq Oral Once  . sodium chloride flush  10-40 mL Intracatheter Q12H  . sodium chloride  flush  10-40 mL Intracatheter Q12H  . sodium chloride flush  3 mL Intravenous Q12H  . sodium chloride flush  3 mL Intravenous Q12H  . spironolactone  12.5 mg Oral Daily  . warfarin  1 mg Oral q1800  . Warfarin - Physician Dosing Inpatient   Does not apply q1800   Continuous Infusions: . sodium chloride 250 mL (04/24/17 0700)  . sodium chloride    . amiodarone 30 mg/hr (04/30/17 0801)  . lactated ringers Stopped (04/24/17 0800)  . magnesium sulfate 1 - 4 g bolus IVPB 4 g (04/30/17 0752)  . milrinone 0.375 mcg/kg/min (04/30/17 0802)   PRN Meds:.sodium chloride, alum & mag hydroxide-simeth, clonazePAM, metoprolol tartrate, ondansetron (ZOFRAN) IV, sodium chloride flush, sodium chloride flush, sodium chloride flush, traMADol  General appearance: alert, cooperative and no distress Neurologic: intact Heart: IRRR IRRR Lungs: Slightly diminished at bases Abdomen: Soft, non tender, bowel sounds present Extremities: Mild LE edema Wound: Clean and dry  Lab Results: CBC: Recent Labs  04/29/17 0410 04/30/17 0500  WBC 6.0 7.0  HGB 9.6* 9.2*  HCT 28.3* 27.3*  PLT 56* 70*   BMET:  Recent Labs  04/29/17 0410 04/30/17 0500  NA 133* 130*  K 3.5 3.3*  CL 100*  94*  CO2 28 27  GLUCOSE 117* 118*  BUN 12 12  CREATININE 0.58 0.59  CALCIUM 7.9* 7.9*    CMET: Lab Results  Component Value Date   WBC 7.0 04/30/2017   HGB 9.2 (L) 04/30/2017   HCT 27.3 (L) 04/30/2017   PLT 70 (L) 04/30/2017   GLUCOSE 118 (H) 04/30/2017   CHOL 163 08/22/2016   TRIG 101 08/22/2016   HDL 65 08/22/2016   LDLDIRECT 143.6 06/04/2012   LDLCALC 78 08/22/2016   ALT 1,809 (H) 04/30/2017   AST 481 (H) 04/30/2017   NA 130 (L) 04/30/2017   K 3.3 (L) 04/30/2017   CL 94 (L) 04/30/2017   CREATININE 0.59 04/30/2017   BUN 12 04/30/2017   CO2 27 04/30/2017   TSH 0.75 09/20/2014   INR 1.24 04/30/2017   HGBA1C 6.2 (H) 04/19/2017   MICROALBUR 0.2 05/21/2014      PT/INR:  Recent Labs  04/30/17 0500    LABPROT 15.7*  INR 1.24   Radiology: No results found.   Assessment/Plan: S/P Procedure(s) (LRB): MITRAL VALVE (MV) REPLACEMENT (N/A) CORONARY ARTERY BYPASS GRAFTING (CABG), ON PUMP, TIMES ONE, USING ENDOSCOPICALLY HARVESTED RIGHT GREATER SAPHENOUS VEIN (N/A) TRANSESOPHAGEAL ECHOCARDIOGRAM (TEE) (N/A) CLIPPING OF LEFT ATRIAL APPENDAGE  1. CV-A fib with CVR. On Amiodarone and Milrinone drips. Co ox slightly decreased to 61.7 this am. Also, on Spironolactone 12.5 mg daily and Coumadin. INR decreased from 1.55 to 1.24. No BB for now. Per HF, consider Losartan in am. 2. Pulmonary-On 2 liters of oxygen via [place].CXR ordered for am. Encourage incentive spirometer 3. Acute combined biventricular heart failure-on Lasix 40 mg IV bid 4. ABL anemia-H and H slightly decreased to 9.2 and 27.3 5. Thrombocytopenia-platelets up to 70,000 6. Hypokalemia-supplement potassium, being mindful as on Spirinolactone 7. CBGs 206/137/127. Pre op HGA1C 6.2. Appears diet controlled. Will need further surveillance of HGA1C after discharge. 8. Hyponatremia-sodium 130 and is related to diuresis. Monitor. 9. Elevated transaminases but decreasing-continue to monitor   ZIMMERMAN,DONIELLE M PA-C 04/30/2017 8:18 AM   I have seen and examined the patient and agree with the assessment and plan as outlined.  Patient slowly improving.  Remains in rate-controlled Afib on milrinone @ 0.375 off dopamine.  CVP low and lungs clear, co-ox 62%.   I/O's difficult to quantify due to urinary incontinence, weight slowly trending down but still 9 lbs > preop.  Mobilize.  PT.  Supplement potassium.  Watch platelet count.  Coumadin 1 mg.  [name], MD 04/30/2017 8:24 AM

## 2017-04-30 NOTE — Progress Notes (Signed)
Advanced Heart Failure Rounding Note  PCP: Dr Elease Hashimoto Primary Cardiologist: Dr Harrington Challenger   Subjective:    Pt underwent CABG x 1 and MVR replacement 04/23/17. Milrinone added 04/27/17 with Mixed venous saturation of 33%.   All drips weaned off except milrinone 0.3 mcg. Yesterday amio drip started for A fib 110-120s. Remains on amio 30 mg per hour.  Todays CO-OX is 62%. Diuresed with IV lasix.  Weight down another pound.   Denies SOB. Walked around the unit this morning. Denies nausea.    Objective:   Weight Range: 139 lb 8.8 oz (63.3 kg) Body mass index is 22.19 kg/m.   Vital Signs:   Temp:  [97.8 F (36.6 C)-99.3 F (37.4 C)] 97.8 F (36.6 C) (08/21 0300) Pulse Rate:  [26-129] 87 (08/21 0600) Resp:  [18-27] 19 (08/21 0600) BP: (87-145)/(49-90) 142/58 (08/21 0600) SpO2:  [85 %-100 %] 100 % (08/21 0600) Weight:  [139 lb 8.8 oz (63.3 kg)] 139 lb 8.8 oz (63.3 kg) (08/21 0600) Last BM Date: 04/28/17  Weight change: Filed Weights   04/28/17 0500 04/29/17 0353 04/30/17 0600  Weight: 141 lb 8.6 oz (64.2 kg) 140 lb 3.4 oz (63.6 kg) 139 lb 8.8 oz (63.3 kg)    Intake/Output:   Intake/Output Summary (Last 24 hours) at 04/30/17 0716 Last data filed at 04/30/17 0618  Gross per 24 hour  Intake          1297.83 ml  Output              600 ml  Net           697.83 ml      Physical Exam   CVP 8-9  General:  Elderly. Well appearing. No resp difficulty. Sitting in the chair.  HEENT: Normal Neck: Supple. JVP 8-9  Carotids 2+ bilat; no bruits. No lymphadenopathy or thyromegaly appreciated. Cor: PMI nondisplaced. Irregular  rate & rhythm. No rubs, gallops or murmurs. Sternal incision approximated.  Lungs: Clear but decreased in the bases.  Abdomen: Soft, nontender, nondistended. No hepatosplenomegaly. No bruits or masses. Good bowel sounds. Extremities: No cyanosis, clubbing, rash, R and LLE trace edema. edema. RUE PICC Neuro: Alert & orientedx3, cranial nerves grossly intact.  moves all 4 extremities w/o difficulty. Affect pleasant   Telemetry  A fib 80s personally reviewed.   EKG    04/28/2017 A fib 115 bpm.   Labs    CBC  Recent Labs  04/29/17 0410 04/30/17 0500  WBC 6.0 7.0  HGB 9.6* 9.2*  HCT 28.3* 27.3*  MCV 89.0 89.8  PLT 56* 70*   Basic Metabolic Panel  Recent Labs  04/29/17 0410 04/30/17 0500  NA 133* 130*  K 3.5 3.3*  CL 100* 94*  CO2 28 27  GLUCOSE 117* 118*  BUN 12 12  CREATININE 0.58 0.59  CALCIUM 7.9* 7.9*  MG 1.2* 1.7   Liver Function Tests  Recent Labs  04/29/17 0410 04/30/17 0500  AST 767* 481*  ALT 2,151* 1,809*  ALKPHOS 85 89  BILITOT 1.4* 1.5*  PROT 5.2* 5.4*  ALBUMIN 2.8* 2.7*   No results for input(s): LIPASE, AMYLASE in the last 72 hours. Cardiac Enzymes No results for input(s): CKTOTAL, CKMB, CKMBINDEX, TROPONINI in the last 72 hours.  BNP: BNP (last 3 results) No results for input(s): BNP in the last 8760 hours.  ProBNP (last 3 results) No results for input(s): PROBNP in the last 8760 hours.   D-Dimer No results for input(s): DDIMER in the  last 72 hours. Hemoglobin A1C No results for input(s): HGBA1C in the last 72 hours. Fasting Lipid Panel No results for input(s): CHOL, HDL, LDLCALC, TRIG, CHOLHDL, LDLDIRECT in the last 72 hours. Thyroid Function Tests No results for input(s): TSH, T4TOTAL, T3FREE, THYROIDAB in the last 72 hours.  Invalid input(s): FREET3  Other results:   Imaging     No results found.   Medications:     Scheduled Medications: . aspirin EC  81 mg Oral Daily  . bisacodyl  10 mg Oral Daily   Or  . bisacodyl  10 mg Rectal Daily  . Chlorhexidine Gluconate Cloth  6 each Topical Q0600  . docusate sodium  200 mg Oral Daily  . furosemide  40 mg Intravenous BID  . insulin aspart  0-24 Units Subcutaneous TID AC & HS  . mouth rinse  15 mL Mouth Rinse BID  . pantoprazole  40 mg Oral Daily  . sodium chloride flush  10-40 mL Intracatheter Q12H  . sodium  chloride flush  10-40 mL Intracatheter Q12H  . sodium chloride flush  3 mL Intravenous Q12H  . sodium chloride flush  3 mL Intravenous Q12H  . warfarin  1 mg Oral q1800  . Warfarin - Physician Dosing Inpatient   Does not apply q1800     Infusions: . sodium chloride 250 mL (04/24/17 0700)  . sodium chloride    . amiodarone 30 mg/hr (04/30/17 0600)  . DOPamine Stopped (04/29/17 1706)  . lactated ringers Stopped (04/24/17 0800)  . lactated ringers 20 mL/hr at 04/30/17 0600  . milrinone 0.375 mcg/kg/min (04/30/17 0600)  . potassium chloride Stopped (04/30/17 0759)     PRN Medications:  sodium chloride, alum & mag hydroxide-simeth, clonazePAM, metoprolol tartrate, ondansetron (ZOFRAN) IV, oxyCODONE, sodium chloride flush, sodium chloride flush, sodium chloride flush, traMADol    Patient Profile   [name] is a 83 y.o. female with h/o MVP, HTN, DM2, scoliosis, and CAD. She had developed DOE over the past 2-3 months along with occasional peripheral edema  S/p MVR and CABG x 1 04/23/17. HF consulted post op with low mixed venous saturation and volume overload.   Assessment/Plan   1. Acute Combined Biventricular HF- EF dropped post op-->EF 25-30% with RV dysfunction.  Todays CO-OX is 62% on milrinone 0.3 mcg. Start wean tomorrow if CO-OX remains stable. Stable off dopamine. CVP 8-9 . Give another dose of IV lasix today. Likely stop tomorrow.  Add 12.5 mg spiro. Consider losartan tomorrow. Hold off on carvedilol.  Add ted hose.  2. S/P CABG x1 MVR - bioprosthetic valve and LA clipping.  3. Shock Liver versus Passive Congestion LFTs continue to improve. Continue watch closely.   4 A fib RVR- remains in A fib with controlled rated. Continue amio drip 30 mg per hour. Coumadin per Dr Roxy Manns . INR 1.24.  5. Hypokalemia- Supplement potassium and adding low dose spiro 6. Hypomagnesium - Mag up to 1.7. Give another 4 grams.  7. Post op anemia- hgb 9.2    Length of Stay: [place], NP  04/30/2017, 7:16 AM  Advanced Heart Failure Team Pager [phone] (M-F; [place])  Please contact [facility] for night-coverage after hours (4p -7a ) and weekends on [email or web address]  Patient seen and examined with Darrick Grinder, NP. We discussed all aspects of the encounter. I agree with the assessment and plan as stated above.   She remains tenuous but improving on milrinone. Dopamine weaned off yesterday. Volume  status looks ok. Co-ox 62%.   Will continue slow wean of milrinone. May need short course of home inotropes.   Remains in AF. Rate controlled on IV amio. Loading coumadin. If doesn't convert prior to d/c may consider TEE/DC-CV.   Supp Mag.  Consider CIR (PT recommending HHPT)  Glori Bickers, MD  2:04 PM

## 2017-04-30 NOTE — Progress Notes (Signed)
EPW removed per MD order. Wires intact upon removal. Patient tolerated procedure well. VSS. Pt placed on bedrest for 1hour. Will continue to monitor.

## 2017-04-30 NOTE — Progress Notes (Signed)
CT Surg PM Rounds  Blood pressure 111/79, pulse 88 temperature 98.4 F (36.9 C), temperature source Oral, resp. rate (!) 22, height 5' 6.5" (1.689 m), weight 139 lb 8.8 oz (63.3 kg), SpO2 93 %.  not motivated to getOOB PM labs ok- potassium supp in progress Fib on amio

## 2017-05-01 ENCOUNTER — Inpatient Hospital Stay (HOSPITAL_COMMUNITY): Payer: Medicare Other

## 2017-05-01 LAB — CBC
HEMATOCRIT: 28.7 % — AB (ref 36.0–46.0)
Hemoglobin: 9.6 g/dL — ABNORMAL LOW (ref 12.0–15.0)
MCH: 30.2 pg (ref 26.0–34.0)
MCHC: 33.4 g/dL (ref 30.0–36.0)
MCV: 90.3 fL (ref 78.0–100.0)
PLATELETS: 90 10*3/uL — AB (ref 150–400)
RBC: 3.18 MIL/uL — ABNORMAL LOW (ref 3.87–5.11)
RDW: 15.3 % (ref 11.5–15.5)
WBC: 7.7 10*3/uL (ref 4.0–10.5)

## 2017-05-01 LAB — GLUCOSE, CAPILLARY
Glucose-Capillary: 155 mg/dL — ABNORMAL HIGH (ref 65–99)
Glucose-Capillary: 229 mg/dL — ABNORMAL HIGH (ref 65–99)
Glucose-Capillary: 101 mg/dL — ABNORMAL HIGH (ref 65–99)

## 2017-05-01 LAB — COOXEMETRY PANEL
CARBOXYHEMOGLOBIN: 0.8 % (ref 0.5–1.5)
CARBOXYHEMOGLOBIN: 1.5 % (ref 0.5–1.5)
METHEMOGLOBIN: 1.2 % (ref 0.0–1.5)
Methemoglobin: 0.8 % (ref 0.0–1.5)
O2 Saturation: 97.7 %
O2 Saturation: 60.8 %
TOTAL HEMOGLOBIN: 9.6 g/dL — AB (ref 12.0–16.0)
Total hemoglobin: 10.2 g/dL — ABNORMAL LOW (ref 12.0–16.0)

## 2017-05-01 LAB — BASIC METABOLIC PANEL
Anion gap: 6 (ref 5–15)
BUN: 9 mg/dL (ref 6–20)
CALCIUM: 8.1 mg/dL — AB (ref 8.9–10.3)
CO2: 29 mmol/L (ref 22–32)
Chloride: 97 mmol/L — ABNORMAL LOW (ref 101–111)
Creatinine, Ser: 0.62 mg/dL (ref 0.44–1.00)
GFR calc Af Amer: >60 mL/min (ref >60)
Glucose, Bld: 154 mg/dL — ABNORMAL HIGH (ref 65–99)
POTASSIUM: 4 mmol/L (ref 3.5–5.1)
SODIUM: 132 mmol/L — AB (ref 135–145)

## 2017-05-01 LAB — PROTIME-INR
INR: 1.36
Prothrombin Time: 16.8 seconds — ABNORMAL HIGH (ref 11.4–15.2)

## 2017-05-01 LAB — MAGNESIUM: MAGNESIUM: 1.5 mg/dL — AB (ref 1.7–2.4)

## 2017-05-01 MED ORDER — TRAMADOL HCL 50 MG PO TABS
50.0000 mg | ORAL_TABLET | Freq: Four times a day (QID) | ORAL | Status: DC | PRN
  Administered 2017-05-01 – 2017-05-02 (×4): 50 mg via ORAL
  Filled 2017-05-01 (×4): qty 1

## 2017-05-01 MED ORDER — SPIRONOLACTONE 25 MG PO TABS
12.5000 mg | ORAL_TABLET | Freq: Every day | ORAL | Status: DC
  Administered 2017-05-01: 12.5 mg via ORAL
  Filled 2017-05-01: qty 1

## 2017-05-01 MED ORDER — SPIRONOLACTONE 25 MG PO TABS: 25.0000 mg | ORAL_TABLET | Freq: Every day | ORAL | Status: DC

## 2017-05-01 MED ORDER — SACUBITRIL-VALSARTAN 24-26 MG PO TABS
1.0000 | ORAL_TABLET | Freq: Two times a day (BID) | ORAL | Status: DC
  Administered 2017-05-01 – 2017-05-08 (×15): 1 via ORAL
  Filled 2017-05-01 (×16): qty 1

## 2017-05-01 MED ORDER — CITALOPRAM HYDROBROMIDE 20 MG PO TABS
20.0000 mg | ORAL_TABLET | Freq: Every day | ORAL | Status: DC
  Administered 2017-05-01 – 2017-05-08 (×8): 20 mg via ORAL
  Filled 2017-05-01 (×8): qty 1

## 2017-05-01 MED ORDER — METFORMIN HCL 500 MG PO TABS
1000.0000 mg | ORAL_TABLET | Freq: Two times a day (BID) | ORAL | Status: DC
Start: 2017-05-01 — End: 2017-05-01

## 2017-05-01 MED ORDER — SODIUM CHLORIDE 0.9% FLUSH: 3.0000 mL | INTRAVENOUS | Status: DC | PRN

## 2017-05-01 MED ORDER — IBUPROFEN 400 MG PO TABS
400.0000 mg | ORAL_TABLET | Freq: Every day | ORAL | Status: DC | PRN
  Administered 2017-05-06 – 2017-05-08 (×3): 400 mg via ORAL
  Filled 2017-05-01 (×3): qty 1

## 2017-05-01 MED ORDER — SODIUM CHLORIDE 0.9% FLUSH
3.0000 mL | Freq: Two times a day (BID) | INTRAVENOUS | Status: DC
  Administered 2017-05-02 – 2017-05-08 (×6): 3 mL via INTRAVENOUS

## 2017-05-01 MED ORDER — FUROSEMIDE 40 MG PO TABS
40.0000 mg | ORAL_TABLET | Freq: Two times a day (BID) | ORAL | Status: DC
  Administered 2017-05-01 – 2017-05-02 (×3): 40 mg via ORAL
  Filled 2017-05-01 (×3): qty 1

## 2017-05-01 MED ORDER — LOSARTAN POTASSIUM 25 MG PO TABS: 12.5000 mg | ORAL_TABLET | Freq: Every day | ORAL | Status: DC

## 2017-05-01 MED ORDER — MOVING RIGHT ALONG BOOK
Freq: Once | Status: AC
  Administered 2017-05-01: 18:00:00
  Filled 2017-05-01: qty 1

## 2017-05-01 MED ORDER — MAGNESIUM SULFATE 4 GM/100ML IV SOLN
4.0000 g | Freq: Once | INTRAVENOUS | Status: AC
  Administered 2017-05-01: 4 g via INTRAVENOUS
  Filled 2017-05-01: qty 100

## 2017-05-01 MED ORDER — SODIUM CHLORIDE 0.9 % IV SOLN: 250.0000 mL | INTRAVENOUS | Status: DC | PRN

## 2017-05-01 NOTE — Progress Notes (Addendum)
TCTS DAILY ICU PROGRESS NOTE                   [address]            [place] [address]          [phone]   8 Days Post-Op Procedure(s) (LRB): MITRAL VALVE (MV) REPLACEMENT (N/A) CORONARY ARTERY BYPASS GRAFTING (CABG), ON PUMP, TIMES ONE, USING ENDOSCOPICALLY HARVESTED RIGHT GREATER SAPHENOUS VEIN (N/A) TRANSESOPHAGEAL ECHOCARDIOGRAM (TEE) (N/A) CLIPPING OF LEFT ATRIAL APPENDAGE  Total Length of Stay:  LOS: 8 days   Subjective: Patient states she had a bad night-felt palpitations and a fib. She did have a bowel movement yesterday.  Objective: Vital signs in last 24 hours: Temp:  [97.5 F (36.4 C)-97.8 F (36.6 C)] 97.5 F (36.4 C) (08/22 0300) Pulse Rate:  [47-146] 81 (08/22 0600) Cardiac Rhythm: Atrial fibrillation (08/22 0000) Resp:  [15-31] 17 (08/22 0600) BP: (102-158)/(53-136) 143/74 (08/22 0600) SpO2:  [92 %-100 %] 98 % (08/22 0600)  Filed Weights   04/28/17 0500 04/29/17 0353 04/30/17 0600  Weight: 64.2 kg (141 lb 8.6 oz) 63.6 kg (140 lb 3.4 oz) 63.3 kg (139 lb 8.8 oz)      Intake/Output from previous day: 08/21 0701 - 08/22 0700 In: 907.3 [P.O.:120; I.V.:487.3; IV Piggyback:300] Out: 1900 [Urine:1900]  Intake/Output this shift: No intake/output data recorded.  Current Meds: Scheduled Meds: . aspirin EC  81 mg Oral Daily  . bisacodyl  10 mg Oral Daily   Or  . bisacodyl  10 mg Rectal Daily  . Chlorhexidine Gluconate Cloth  6 each Topical Q0600  . docusate sodium  200 mg Oral Daily  . furosemide  40 mg Oral BID  . insulin aspart  0-24 Units Subcutaneous TID AC & HS  . mouth rinse  15 mL Mouth Rinse BID  . pantoprazole  40 mg Oral Daily  . sacubitril-valsartan  1 tablet Oral BID  . sodium chloride flush  10-40 mL Intracatheter Q12H  . sodium chloride flush  10-40 mL Intracatheter Q12H  . sodium chloride flush  3 mL Intravenous Q12H  . sodium chloride flush  3 mL Intravenous Q12H  . spironolactone  12.5 mg Oral Daily  . warfarin   1 mg Oral q1800  . Warfarin - Physician Dosing Inpatient   Does not apply q1800   Continuous Infusions: . sodium chloride 250 mL (04/24/17 0700)  . sodium chloride    . amiodarone 30 mg/hr (04/30/17 2028)  . lactated ringers Stopped (04/24/17 0800)  . milrinone 0.3 mcg/kg/min (05/01/17 0749)  . potassium chloride Stopped (04/30/17 2100)   PRN Meds:.sodium chloride, alum & mag hydroxide-simeth, clonazePAM, metoprolol tartrate, ondansetron (ZOFRAN) IV, potassium chloride, sodium chloride flush, sodium chloride flush, sodium chloride flush, traMADol  General appearance: alert, cooperative and no distress Neurologic: intact Heart: IRRR IRRR Lungs: Slightly diminished at bases Abdomen: Soft, non tender, bowel sounds present Extremities: SCDs in place Wound: Clean and dry  Lab Results: CBC:  Recent Labs  04/29/17 0410 04/30/17 0500 04/30/17 1719  WBC 6.0 7.0  --   HGB 9.6* 9.2* 8.5*  HCT 28.3* 27.3* 25.0*  PLT 56* 70*  --    BMET:   Recent Labs  04/30/17 0500 04/30/17 1719 05/01/17 0547  NA 130* 135 132*  K 3.3* 3.2* 4.0  CL 94*  --  97*  CO2 27  --  29  GLUCOSE 118* 96 154*  BUN 12  --  9  CREATININE 0.59  --  0.62  CALCIUM 7.9*  --  8.1*    CMET: Lab Results  Component Value Date   WBC 7.0 04/30/2017   HGB 8.5 (L) 04/30/2017   HCT 25.0 (L) 04/30/2017   PLT 70 (L) 04/30/2017   GLUCOSE 154 (H) 05/01/2017   CHOL 163 08/22/2016   TRIG 101 08/22/2016   HDL 65 08/22/2016   LDLDIRECT 143.6 06/04/2012   LDLCALC 78 08/22/2016   ALT 1,809 (H) 04/30/2017   AST 481 (H) 04/30/2017   NA 132 (L) 05/01/2017   K 4.0 05/01/2017   CL 97 (L) 05/01/2017   CREATININE 0.62 05/01/2017   BUN 9 05/01/2017   CO2 29 05/01/2017   TSH 0.75 09/20/2014   INR 1.36 05/01/2017   HGBA1C 6.2 (H) 04/19/2017   MICROALBUR 0.2 05/21/2014      PT/INR:   Recent Labs  05/01/17 0547  LABPROT 16.8*  INR 1.36   Radiology: Dg Chest Port 1 View  Result Date: 05/01/2017 CLINICAL  DATA:  Sore chest. EXAM: PORTABLE CHEST 1 VIEW COMPARISON:  04/29/2017. FINDINGS: Right PICC line in stable position. Prior cardiac valve replacement. Prior CABG. Left atrial appendage clip noted stable position. Stable cardiomegaly. Persistent bibasilar atelectasis. Small left pleural effusion. No pneumothorax. IMPRESSION: 1.  Right PICC line in stable position. 2.  Prior CABG and cardiac valve replacement.  Stable cardiomegaly. 3. Persistent bibasilar atelectasis, unchanged. Small left pleural effusion. Electronically Signed   By: Marcello Moores  Register   On: 05/01/2017 06:47     Assessment/Plan: S/P Procedure(s) (LRB): MITRAL VALVE (MV) REPLACEMENT (N/A) CORONARY ARTERY BYPASS GRAFTING (CABG), ON PUMP, TIMES ONE, USING ENDOSCOPICALLY HARVESTED RIGHT GREATER SAPHENOUS VEIN (N/A) TRANSESOPHAGEAL ECHOCARDIOGRAM (TEE) (N/A) CLIPPING OF LEFT ATRIAL APPENDAGE  1. CV-A fib with CVR. On Amiodarone and Milrinone drips. Co ox 97.7 this am-being redrawn. Redrawn and is 60.8 on repeat. Also, on Entresto 24/26 mg bid,Spironolactone 12.5 mg daily and Coumadin. INR 1.36. Will continue with 1 mg of Coumadin. No BB for now.  2. Pulmonary-On 2 liters of oxygen via .Will wean as abole over next several days. CXR appears stable (no pneumothorax, small pleural effusions, cardiomegaly, and bibasilar atelectasis) . Encourage incentive spirometer 3. Acute combined biventricular heart failure-on Lasix 40 mg PO bid 4. ABL anemia-Await H and H this am 5. Thrombocytopenia-await this am's platelet count. 6. CBGs 234/103/184. Pre op HGA1C 6.2. Appears diet controlled. Will need further surveillance of HGA1C after discharge. Will stop accu checks and SS in am. 7. Hyponatremia-sodium up to 132 and is related to diuresis. Monitor. 8. Elevated transaminases but decreasing-continue to monitor 9. Will transfer to 4E  [name] 05/01/2017 7:58 AM   I have seen and examined the patient and agree with the assessment  and plan as outlined.  Remains in Afib w/ adequate rate control.  Agree w/ plans to wean milrinone slowly and transfer step down.  Continue lasix, spironolactone.  Continue Coumadin 1 mg/day   [name], MD 05/01/2017 10:04 AM

## 2017-05-01 NOTE — Progress Notes (Signed)
PT Cancellation Note  Patient Details Name: [place]ED[place] MRN: [id number] DOB: 1933-12-07   Cancelled Treatment:    Reason Eval/Treat Not Completed: Other (comment) (Just walked with nursing. Will check back as able.  )   Denice Paradise 05/01/2017, 12:02 PM  Maryori Weide,PT Acute Rehabilitation [phone] (pager)

## 2017-05-01 NOTE — Progress Notes (Signed)
Physical Therapy Treatment Patient Details Name: [name] MRN: [id number] DOB: Jan 05, 1934 Today's Date: 05/01/2017    History of Present Illness [name] is a 83 y.o. female with h/o MVP, HTN, DM2, scoliosis, and CAD. She had developed DOE over the past 2-3 months along with occasional peripheral edema. TEE 03/04/17 showed severe MR and severe LAE. L/RHC 03/28/17 with severe multivessel disease. Pt underwent CABG x 1 and MVR replacement 04/23/17.     PT Comments    Pt performe gait training activity and reviewed use of incentive spirometer during session.  HR WNL during session, O2 1Lpm, required 2L during gait to maintain between 88%-95%.  Cues for pursed lip breathing throughout.  Pt with bowel incontinence during session this pm.  Nursing aware.  Plan next session to continue education of sternal precautions and encouraging OOB mobility.    Follow Up Recommendations  Home health PT;Supervision/Assistance - 24 hour     Equipment Recommendations  3in1 (PT)    Recommendations for Other Services       Precautions / Restrictions Precautions Precautions: Fall;Sternal Precaution Comments: No pushing, pulling or lifting with UEs.  PTA reviewed and educated patient on precautions during session.   Restrictions Weight Bearing Restrictions: Yes Other Position/Activity Restrictions: sternal precautions    Mobility  Bed Mobility Overal bed mobility: Needs Assistance Bed Mobility: Sidelying to Sit;Sit to Sidelying;Rolling Rolling: Min assist Sidelying to sit: Mod assist Supine to sit: Min assist     General bed mobility comments: Cues for holding heart pillow to discourage UE use.  Pt required assist to elevate trunk but able to advance limbs to edge of bed.  Pt then required assistance for back to bed to lift B LEs against gravity.    Transfers Overall transfer level: Needs assistance Equipment used: Rolling walker (2 wheeled) Transfers: Sit to/from [employer] Sit to Stand: Mod assist         General transfer comment: Pt required assist to boost into standing.  Pt holding heart pillow during transfer and switched to RW in standing.  Pt required cues for pushing with B LEs.    Ambulation/Gait Ambulation/Gait assistance: Min assist Ambulation Distance (Feet): 170 Feet Assistive device: Rolling walker (2 wheeled) Gait Pattern/deviations: Step-through pattern;Decreased stride length;Shuffle;Trunk flexed   Gait velocity interpretation: Below normal speed for age/gender General Gait Details: Cues for upper trunk control and using RW for standing balance vs support to maintain sternal precautions.  Pt required cues for obstacle negotiation and RW safety.     Stairs            Wheelchair Mobility    Modified Rankin (Stroke Patients Only)       Balance     Sitting balance-Leahy Scale: Fair       Standing balance-Leahy Scale: Poor                              Cognition Arousal/Alertness: Awake/alert Behavior During Therapy: Flat affect Overall Cognitive Status: Within Functional Limits for tasks assessed                                 General Comments: Pt incontinent during session.        Exercises Other Exercises Other Exercises: Incentive Spirometer: 1x10 reps: 553ml quality.  Cues for correct technique and frequency.      General Comments  Pertinent Vitals/Pain Pain Assessment: 0-10 Pain Score: 3  Pain Location: back, "I got rods in my back" Pain Descriptors / Indicators: Aching Pain Intervention(s): Monitored during session;Repositioned    Home Living                      Prior Function            PT Goals (current goals can now be found in the care plan section) Acute Rehab PT Goals Patient Stated Goal: To be able to cook again  Potential to Achieve Goals: Good Progress towards PT goals: Progressing toward goals    Frequency    Min  3X/week      PT Plan Current plan remains appropriate    Co-evaluation              AM-PAC PT "6 Clicks" Daily Activity  Outcome Measure  Difficulty turning over in bed (including adjusting bedclothes, sheets and blankets)?: Unable Difficulty moving from lying on back to sitting on the side of the bed? : Unable Difficulty sitting down on and standing up from a chair with arms (e.g., wheelchair, bedside commode, etc,.)?: Unable Help needed moving to and from a bed to chair (including a wheelchair)?: A Lot Help needed walking in hospital room?: A Little Help needed climbing 3-5 steps with a railing? : Total 6 Click Score: 9    End of Session Equipment Utilized During Treatment: Gait belt Activity Tolerance: Patient limited by fatigue Patient left: in chair;with call bell/phone within reach;with nursing/sitter in room   PT Visit Diagnosis: Unsteadiness on feet (R26.81);Muscle weakness (generalized) (M62.81)     Time: 9937-1696 PT Time Calculation (min) (ACUTE ONLY): 29 min  Charges:  $Gait Training: 8-22 mins $Therapeutic Activity: 8-22 mins                    G Codes:       [name], PTA pager [phone]    [name] 05/01/2017, 3:18 PM

## 2017-05-01 NOTE — Progress Notes (Signed)
Advanced Heart Failure Rounding Note  PCP: Dr Elease Hashimoto Primary Cardiologist: Dr Harrington Challenger   Subjective:    Pt underwent CABG x 1 and MVR replacement 04/23/17. Milrinone added 04/27/17 with Mixed venous saturation of 33%.   Remains on milrinone 0.3 mcg. I/O not accurate due to incontinent. Remains on amio 30 mg per hour. Todays CO-OX 97%-->repeating now.   Feeling better. Denies SOB. Ambulated 2x around the unit.   Objective:   Weight Range: 139 lb 8.8 oz (63.3 kg) Body mass index is 22.19 kg/m.   Vital Signs:   Temp:  [97.5 F (36.4 C)-98.4 F (36.9 C)] 97.5 F (36.4 C) (08/22 0300) Pulse Rate:  [77-146] 136 (08/22 0000) Resp:  [15-26] 16 (08/22 0000) BP: (102-158)/(53-136) 139/68 (08/22 0000) SpO2:  [92 %-100 %] 96 % (08/22 0000) Last BM Date: 04/30/17  Weight change: Filed Weights   04/28/17 0500 04/29/17 0353 04/30/17 0600  Weight: 141 lb 8.6 oz (64.2 kg) 140 lb 3.4 oz (63.6 kg) 139 lb 8.8 oz (63.3 kg)    Intake/Output:   Intake/Output Summary (Last 24 hours) at 05/01/17 0706 Last data filed at 05/01/17 0000  Gross per 24 hour  Intake            808.5 ml  Output              800 ml  Net              8.5 ml      Physical Exam  CVP 11-12 personally checked General:  Elderly. NAD. In bed.  HEENT: normal Neck: supple. JVP ~10 . Carotids 2+ bilat; no bruits. No lymphadenopathy or thryomegaly appreciated. Cor: PMI nondisplaced. Irregular rate & rhythm. No rubs, gallops or murmurs. Sternal incision approximated.  Lungs: clear on room air Abdomen: soft, nontender, nondistended. No hepatosplenomegaly. No bruits or masses. Good bowel sounds. Extremities: no cyanosis, clubbing, rash, edema. R and LLE SCDs. RUE PICC Neuro: alert & orientedx3, cranial nerves grossly intact. moves all 4 extremities w/o difficulty. Affect pleasant   Telemetry  A fib 90s personally reviewed.    EKG    04/28/2017 A fib 115 bpm.   Labs    CBC  Recent Labs  04/29/17 0410  04/30/17 0500 04/30/17 1719  WBC 6.0 7.0  --   HGB 9.6* 9.2* 8.5*  HCT 28.3* 27.3* 25.0*  MCV 89.0 89.8  --   PLT 56* 70*  --    Basic Metabolic Panel  Recent Labs  04/29/17 0410 04/30/17 0500 04/30/17 1719 05/01/17 0547  NA 133* 130* 135 132*  K 3.5 3.3* 3.2* 4.0  CL 100* 94*  --  97*  CO2 28 27  --  29  GLUCOSE 117* 118* 96 154*  BUN 12 12  --  9  CREATININE 0.58 0.59  --  0.62  CALCIUM 7.9* 7.9*  --  8.1*  MG 1.2* 1.7  --   --    Liver Function Tests  Recent Labs  04/29/17 0410 04/30/17 0500  AST 767* 481*  ALT 2,151* 1,809*  ALKPHOS 85 89  BILITOT 1.4* 1.5*  PROT 5.2* 5.4*  ALBUMIN 2.8* 2.7*   No results for input(s): LIPASE, AMYLASE in the last 72 hours. Cardiac Enzymes No results for input(s): CKTOTAL, CKMB, CKMBINDEX, TROPONINI in the last 72 hours.  BNP: BNP (last 3 results) No results for input(s): BNP in the last 8760 hours.  ProBNP (last 3 results) No results for input(s): PROBNP in the last 8760  hours.   D-Dimer No results for input(s): DDIMER in the last 72 hours. Hemoglobin A1C No results for input(s): HGBA1C in the last 72 hours. Fasting Lipid Panel No results for input(s): CHOL, HDL, LDLCALC, TRIG, CHOLHDL, LDLDIRECT in the last 72 hours. Thyroid Function Tests No results for input(s): TSH, T4TOTAL, T3FREE, THYROIDAB in the last 72 hours.  Invalid input(s): FREET3  Other results:   Imaging    No results found.   Medications:     Scheduled Medications: . aspirin EC  81 mg Oral Daily  . bisacodyl  10 mg Oral Daily   Or  . bisacodyl  10 mg Rectal Daily  . Chlorhexidine Gluconate Cloth  6 each Topical Q0600  . docusate sodium  200 mg Oral Daily  . furosemide  40 mg Intravenous BID  . insulin aspart  0-24 Units Subcutaneous TID AC & HS  . mouth rinse  15 mL Mouth Rinse BID  . pantoprazole  40 mg Oral Daily  . sodium chloride flush  10-40 mL Intracatheter Q12H  . sodium chloride flush  10-40 mL Intracatheter Q12H  .  sodium chloride flush  3 mL Intravenous Q12H  . sodium chloride flush  3 mL Intravenous Q12H  . spironolactone  12.5 mg Oral Daily  . warfarin  1 mg Oral q1800  . Warfarin - Physician Dosing Inpatient   Does not apply q1800    Infusions: . sodium chloride 250 mL (04/24/17 0700)  . sodium chloride    . amiodarone 30 mg/hr (04/30/17 2028)  . lactated ringers Stopped (04/24/17 0800)  . milrinone 0.375 mcg/kg/min (04/30/17 2028)  . potassium chloride Stopped (04/30/17 2100)    PRN Medications: sodium chloride, alum & mag hydroxide-simeth, clonazePAM, metoprolol tartrate, ondansetron (ZOFRAN) IV, potassium chloride, sodium chloride flush, sodium chloride flush, sodium chloride flush, traMADol    Patient Profile   [name] is a 83 y.o. female with h/o MVP, HTN, DM2, scoliosis, and CAD. She had developed DOE over the past 2-3 months along with occasional peripheral edem   S/p MVR and CABG x 1 04/23/17. HF consulted post op with low mixed venous saturation and volume overload.   Assessment/Plan   1. Acute Combined Biventricular HF- EF dropped post op-->EF 25-30% with RV dysfunction.  CO-OX elevated 97%. Repeat CO-POX now. Cut back milrinone 0.3 mcg.  CVP 11-12. Continue 40 mg IV lasix twice a daily.  Continue spiro 12.5 mg twice a day. Add entresto 24-26 mg twice daily.  Renal function stable.  Hold off on carvedilol.  Add ted hose.    2. S/P CABG x1 MVR - bioprosthetic valve and LA clipping.  3. Shock Liver versus Passive Congestion LFTs in am. Continue watch closely.   4 A fib RVR- remains in A fib with controlled rated. Continue amio drip 30 mg per hour. Coumadin per Dr Roxy Manns . INR 1.36   5. Hypokalemia- K 4.0 today 6. Hypomagnesium - Mag pending 7. Post op anemia-  CBC pending.    Length of Stay: Cable, NP  05/01/2017, 7:06 AM  Advanced Heart Failure Team Pager [phone] (M-F; 7a - 4p)  Please contact [facility] for night-coverage after hours (4p -7a )  and weekends on [email or web address]  Patient seen and examined with Darrick Grinder, NP. We discussed all aspects of the encounter. I agree with the assessment and plan as stated above.   She is tenuous but continues to improve. On milrinone 0.375. Co-ox inaccurate. Will decrease to 0.3.  Volume status looks good. Weight down a pound. BP stable. Agree with Entresto. Continue amio and warfarin for AF. Will consider DCCV prior to discharge.   PT recommending HHPT. Will re-evaluate for CIR.  Transfer SDU.   Glori Bickers, MD  7:36 AM

## 2017-05-01 NOTE — Progress Notes (Signed)
Report called and given to 4e RN. Pt called husband and notifiy of move to 4e room 4. Belongings sent with pt.

## 2017-05-02 ENCOUNTER — Inpatient Hospital Stay (HOSPITAL_COMMUNITY): Payer: Medicare Other

## 2017-05-02 ENCOUNTER — Encounter (HOSPITAL_COMMUNITY): Payer: Self-pay | Admitting: *Deleted

## 2017-05-02 DIAGNOSIS — I059 Rheumatic mitral valve disease, unspecified: Secondary | ICD-10-CM

## 2017-05-02 HISTORY — PX: CARDIAC CATHETERIZATION: SHX172

## 2017-05-02 LAB — GLUCOSE, CAPILLARY
GLUCOSE-CAPILLARY: 145 mg/dL — AB (ref 65–99)
Glucose-Capillary: 134 mg/dL — ABNORMAL HIGH (ref 65–99)
Glucose-Capillary: 169 mg/dL — ABNORMAL HIGH (ref 65–99)
Glucose-Capillary: 115 mg/dL — ABNORMAL HIGH (ref 65–99)
Glucose-Capillary: 190 mg/dL — ABNORMAL HIGH (ref 65–99)

## 2017-05-02 LAB — COMPREHENSIVE METABOLIC PANEL
ALBUMIN: 2.6 g/dL — AB (ref 3.5–5.0)
ALT: 765 U/L — AB (ref 14–54)
AST: 69 U/L — AB (ref 15–41)
Alkaline Phosphatase: 79 U/L (ref 38–126)
Anion gap: 8 (ref 5–15)
BILIRUBIN TOTAL: 1.1 mg/dL (ref 0.3–1.2)
BUN: 9 mg/dL (ref 6–20)
CHLORIDE: 93 mmol/L — AB (ref 101–111)
CO2: 29 mmol/L (ref 22–32)
CREATININE: 0.68 mg/dL (ref 0.44–1.00)
Calcium: 7.8 mg/dL — ABNORMAL LOW (ref 8.9–10.3)
GFR calc Af Amer: >60 mL/min (ref >60)
Glucose, Bld: 119 mg/dL — ABNORMAL HIGH (ref 65–99)
POTASSIUM: 2.9 mmol/L — AB (ref 3.5–5.1)
Sodium: 130 mmol/L — ABNORMAL LOW (ref 135–145)
TOTAL PROTEIN: 5.3 g/dL — AB (ref 6.5–8.1)

## 2017-05-02 LAB — COOXEMETRY PANEL
CARBOXYHEMOGLOBIN: 1.2 % (ref 0.5–1.5)
CARBOXYHEMOGLOBIN: 1.5 % (ref 0.5–1.5)
METHEMOGLOBIN: 0.9 % (ref 0.0–1.5)
Methemoglobin: 1.3 % (ref 0.0–1.5)
O2 SAT: 53.6 %
O2 Saturation: 56.4 %
TOTAL HEMOGLOBIN: 10 g/dL — AB (ref 12.0–16.0)
Total hemoglobin: 10 g/dL — ABNORMAL LOW (ref 12.0–16.0)

## 2017-05-02 LAB — MAGNESIUM: Magnesium: 1.5 mg/dL — ABNORMAL LOW (ref 1.7–2.4)

## 2017-05-02 LAB — CBC
HEMATOCRIT: 29.6 % — AB (ref 36.0–46.0)
Hemoglobin: 9.8 g/dL — ABNORMAL LOW (ref 12.0–15.0)
MCH: 29.8 pg (ref 26.0–34.0)
MCHC: 33.1 g/dL (ref 30.0–36.0)
MCV: 90 fL (ref 78.0–100.0)
PLATELETS: 132 10*3/uL — AB (ref 150–400)
RBC: 3.29 MIL/uL — ABNORMAL LOW (ref 3.87–5.11)
RDW: 15.9 % — AB (ref 11.5–15.5)
WBC: 7.7 10*3/uL (ref 4.0–10.5)

## 2017-05-02 LAB — PROTIME-INR
INR: 1.32
PROTHROMBIN TIME: 16.5 s — AB (ref 11.4–15.2)

## 2017-05-02 MED ORDER — AMIODARONE HCL 200 MG PO TABS
400.0000 mg | ORAL_TABLET | Freq: Two times a day (BID) | ORAL | Status: DC
  Administered 2017-05-02 – 2017-05-06 (×10): 400 mg via ORAL
  Filled 2017-05-02 (×10): qty 2

## 2017-05-02 MED ORDER — WARFARIN VIDEO
Freq: Once | Status: AC
  Administered 2017-05-03: 10:00:00

## 2017-05-02 MED ORDER — POTASSIUM CHLORIDE CRYS ER 20 MEQ PO TBCR
80.0000 meq | EXTENDED_RELEASE_TABLET | Freq: Once | ORAL | Status: AC
  Administered 2017-05-02: 80 meq via ORAL
  Filled 2017-05-02: qty 4

## 2017-05-02 MED ORDER — PATIENT'S GUIDE TO USING COUMADIN BOOK
Freq: Once | Status: AC
  Administered 2017-05-02: 14:00:00
  Filled 2017-05-02: qty 1

## 2017-05-02 MED ORDER — MAGNESIUM SULFATE 4 GM/100ML IV SOLN
4.0000 g | Freq: Once | INTRAVENOUS | Status: AC
  Administered 2017-05-02: 4 g via INTRAVENOUS
  Filled 2017-05-02: qty 100

## 2017-05-02 MED ORDER — WARFARIN SODIUM 2 MG PO TABS
2.0000 mg | ORAL_TABLET | Freq: Once | ORAL | Status: AC
  Administered 2017-05-02: 2 mg via ORAL
  Filled 2017-05-02: qty 1

## 2017-05-02 MED ORDER — SPIRONOLACTONE 25 MG PO TABS
25.0000 mg | ORAL_TABLET | Freq: Every day | ORAL | Status: DC
  Administered 2017-05-02 – 2017-05-07 (×6): 25 mg via ORAL
  Filled 2017-05-02 (×6): qty 1

## 2017-05-02 MED ORDER — POTASSIUM CHLORIDE CRYS ER 20 MEQ PO TBCR
40.0000 meq | EXTENDED_RELEASE_TABLET | Freq: Once | ORAL | Status: AC
  Administered 2017-05-02: 40 meq via ORAL
  Filled 2017-05-02: qty 2

## 2017-05-02 NOTE — Progress Notes (Addendum)
[address]       ,[place] [address]             [phone]      9 Days Post-Op Procedure(s) (LRB): MITRAL VALVE (MV) REPLACEMENT (N/A) CORONARY ARTERY BYPASS GRAFTING (CABG), ON PUMP, TIMES ONE, USING ENDOSCOPICALLY HARVESTED RIGHT GREATER SAPHENOUS VEIN (N/A) TRANSESOPHAGEAL ECHOCARDIOGRAM (TEE) (N/A) CLIPPING OF LEFT ATRIAL APPENDAGE   Subjective:  Ms. Dwyer states she doesn't feel all that well.  She continues to experience palpitations.  She also complaints that her neck is very sore.  She has not yet ambulated since moving to 4E.  + BM  Objective: Vital signs in last 24 hours: Temp:  [97.4 F (36.3 C)-98.6 F (37 C)] 98.1 F (36.7 C) (08/23 0654) Pulse Rate:  [26-118] 118 (08/23 0654) Cardiac Rhythm: Atrial fibrillation (08/22 2059) Resp:  [14-27] 19 (08/23 0654) BP: (80-140)/(45-77) 113/58 (08/23 0654) SpO2:  [93 %-100 %] 96 % (08/23 0654) Weight:  [135 lb (61.2 kg)-139 lb (63 kg)] 139 lb (63 kg) (08/23 0654)  Hemodynamic parameters for last 24 hours: CVP:  [5 mmHg-11 mmHg] 11 mmHg  Intake/Output from previous day: 08/22 0701 - 08/23 0700 In: 530 [P.O.:240; I.V.:190; IV Piggyback:100] Out: 900 [Urine:900]  General appearance: alert, cooperative and no distress Heart: irregularly irregular rhythm Lungs: clear to auscultation bilaterally Abdomen: soft, non-tender; bowel sounds normal; no masses,  no organomegaly Extremities: edema trace Wound: clean and dry  Lab Results:  Recent Labs  05/01/17 0753 05/02/17 0359  WBC 7.7 7.7  HGB 9.6* 9.8*  HCT 28.7* 29.6*  PLT 90* 132*   BMET:  Recent Labs  05/01/17 0547 05/02/17 0359  NA 132* 130*  K 4.0 2.9*  CL 97* 93*  CO2 29 29  GLUCOSE 154* 119*  BUN 9 9  CREATININE 0.62 0.68  CALCIUM 8.1* 7.8*    PT/INR:  Recent Labs  05/02/17 0359  LABPROT 16.5*  INR 1.32   ABG    Component Value Date/Time   PHART 7.335 (L) 04/24/2017 0444   HCO3 20.4 04/24/2017 0444   TCO2 23  04/24/2017 1736   ACIDBASEDEF 5.0 (H) 04/24/2017 0444   O2SAT 56.4 05/02/2017 0420   CBG (last 3)   Recent Labs  05/01/17 1133 05/01/17 1611 05/01/17 2103  GLUCAP 229* 101* 145*    Assessment/Plan: S/P Procedure(s) (LRB): MITRAL VALVE (MV) REPLACEMENT (N/A) CORONARY ARTERY BYPASS GRAFTING (CABG), ON PUMP, TIMES ONE, USING ENDOSCOPICALLY HARVESTED RIGHT GREATER SAPHENOUS VEIN (N/A) TRANSESOPHAGEAL ECHOCARDIOGRAM (TEE) (N/A) CLIPPING OF LEFT ATRIAL APPENDAGE  1. CV- rate controlled A. Fib will d/c IV amiodarone start oral tablets at 400 mg BID, Co-ox this morning is 56.4 on .3 of milrinone wean per AHF, remains on entresto with labile BP... No BB at this time 2. INR 1.32, not responding to 1 mg of coumadin, likely due to amiodarone... Aware of big jump previously, but will give 2 mg tonight and watch INR closely 3. Pulm- no acute issues, continue IS 4. Renal- creaitnine remains stable, weight is elevated by weight this morning, however patient states she wasn't weighed.. Will have nursing staff weight patient for accuracy, continue lasix, Spironolactone 5. Hypokalemia- will give potassium supplement today 6. Dispo- patient stable, remains in rate controlled A. Fib will transition to oral amiodarone, milrinone drip per AHF, supplement K, continue diuresis   LOS: 9 days    BARRETT, ERIN 05/02/2017   I have seen and examined the patient and agree with the assessment  and plan as outlined.  [name], MD 05/02/2017 10:48 AM

## 2017-05-02 NOTE — Progress Notes (Signed)
Occupational Therapy Treatment Patient Details Name: [name] MRN: [id number] DOB: [birth year] Today's Date: 05/02/2017    History of present illness [name] is a 83 y.o. female with h/o MVP, HTN, DM2, scoliosis, and CAD. She had developed DOE over the past 2-3 months along with occasional peripheral edema. TEE 03/04/17 showed severe MR and severe LAE. L/RHC 03/28/17 with severe multivessel disease. Pt underwent CABG x 1 and MVR replacement 04/23/17.    OT comments  This 82 yo female admitted and underwent above presents to acute OT making progress with basic ADLs and transfers. She will continue to benefit from acute OT without need for follow up.  Follow Up Recommendations  No OT follow up;Supervision/Assistance - 24 hour    Equipment Recommendations  3 in 1 bedside commode       Precautions / Restrictions Precautions Precautions: Fall;Sternal Precaution Comments: No pushing, pulling or lifting with UEs.  pt able to state no pushing/pulling at beginning and end of session but needed reminder for no lifting   Restrictions Weight Bearing Restrictions: Yes Other Position/Activity Restrictions: sternal precautions       Mobility Bed Mobility Overal bed mobility: Needs Assistance Bed Mobility: Rolling;Sidelying to Sit Rolling: Supervision Sidelying to sit: Min assist       General bed mobility comments: VCs for sequencing and then minimal A for up with trunk  Transfers Overall transfer level: Needs assistance Equipment used: Rolling walker (2 wheeled) Transfers: Sit to/from Stand           General transfer comment: min guard A from bed reminding of sternal precautions; min A from toilet    Balance Overall balance assessment: Needs assistance;History of Falls Sitting-balance support: No upper extremity supported;Feet supported Sitting balance-Leahy Scale: Good     Standing balance support: No upper extremity supported;During functional activity Standing  balance-Leahy Scale: Fair Standing balance comment: Can stand at sink for grooming tasks without UE support, but needs Bil UE support for ambulation                           ADL either performed or assessed with clinical judgement   ADL Overall ADL's : Needs assistance/impaired     Grooming: Wash/dry face;Oral care;Brushing hair;Min guard;Standing                   Toilet Transfer: Minimal assistance;Ambulation;RW;Comfort height toilet Toilet Transfer Details (indicate cue type and reason): needed A due to lower toilet and sternal precautions Toileting- Clothing Manipulation and Hygiene: Min guard Toileting - Clothing Manipulation Details (indicate cue type and reason): min A sit<>stand             Vision Patient Visual Report: No change from baseline            Cognition Arousal/Alertness: Awake/alert Behavior During Therapy: WFL for tasks assessed/performed Overall Cognitive Status: Within Functional Limits for tasks assessed                                                     Pertinent Vitals/ Pain       Pain Assessment: No/denies pain         Frequency  Min 2X/week        Progress Toward Goals  OT Goals(current goals can now be found in the care  plan section)  Progress towards OT goals: Progressing toward goals     Plan Discharge plan remains appropriate       AM-PAC PT "6 Clicks" Daily Activity     Outcome Measure   Help from another person eating meals?: None Help from another person taking care of personal grooming?: A Little Help from another person toileting, which includes using toliet, bedpan, or urinal?: A Little Help from another person bathing (including washing, rinsing, drying)?: A Little Help from another person to put on and taking off regular upper body clothing?: A Little Help from another person to put on and taking off regular lower body clothing?: A Lot 6 Click Score: 18    End of  Session Equipment Utilized During Treatment: Rolling walker  OT Visit Diagnosis: Unsteadiness on feet (R26.81)   Activity Tolerance Patient tolerated treatment well   Patient Left  (with Pt ambulating in hallway)   Nurse Communication          Time: 0762-2633 OT Time Calculation (min): 24 min  Charges: OT General Charges $OT Visit: 1 Procedure OT Treatments $Self Care/Home Management : 23-37 mins  [address], OTR/L [phone] 05/02/2017

## 2017-05-02 NOTE — Progress Notes (Signed)
  Afternoon Coox 53%.  Would not wean milrinone further at this time (today).     Legrand Como [phone]" [place], PA-C 05/02/2017 3:10 PM

## 2017-05-02 NOTE — Progress Notes (Signed)
CARDIAC REHAB PHASE I   PRE:  Rate/Rhythm: 91 a fib  BP:  Sitting: 113/63        SaO2: 95 RA  MODE:  Ambulation: 200 ft   POST:  Rate/Rhythm: 109 a fib  BP:  Sitting: 120/59         SaO2: 100 RA  Pt incontinent of urine while in recliner, assisted in personal hygiene. Pt ambulated 200 ft on RA, IV, rolling walker, assist x1, slow, mildly unsteady gait at times, tolerated fairly well. Pt c/o DOE, fatigue with distance, standing rest x1. Pt to bed per pt request after walk, call bell within reach. Encouraged additional ambulation x2 today, will follow.   [phone] Lenna Sciara, RN, BSN 05/02/2017 11:01 AM

## 2017-05-02 NOTE — Progress Notes (Signed)
Physical Therapy Treatment Patient Details Name: [name] MRN: [id number] DOB: 09-21-1933 Today's Date: 05/02/2017    History of Present Illness [name] is a 83 y.o. female with h/o MVP, HTN, DM2, scoliosis, and CAD. She had developed DOE over the past 2-3 months along with occasional peripheral edema. TEE 03/04/17 showed severe MR and severe LAE. L/RHC 03/28/17 with severe multivessel disease. Pt underwent CABG x 1 and MVR replacement 04/23/17.     PT Comments    Pt is making good progress towards her goals. Pt currently, min A for bed mobility, transfers and ambulation of 150 feet with RW. Pt requires skilled PT to progress gait training and to improve LE strength and endurance to safely navigate her home environment at discharge.   Follow Up Recommendations  Home health PT;Supervision/Assistance - 24 hour     Equipment Recommendations  3in1 (PT)    Recommendations for Other Services       Precautions / Restrictions Precautions Precautions: Fall;Sternal Precaution Comments: No pushing, pulling or lifting with UEs.  Pt able to recall 3/3 stemal precautions at end of session. Restrictions Weight Bearing Restrictions: Yes Other Position/Activity Restrictions: sternal precautions    Mobility  Bed Mobility Overal bed mobility: Needs Assistance Bed Mobility: Sit to Sidelying;Rolling Rolling: Supervision Sidelying to sit: Min assist     Sit to sidelying: Min assist General bed mobility comments: minA for LE management into bed   Transfers Overall transfer level: Needs assistance Equipment used: Rolling walker (2 wheeled) Transfers: Sit to/from [facility] Sit to Stand: Min assist         General transfer comment: vc for maintaining sternal precautions with stand to sit on commode minA for power up from toilet with use of sternal pillow  Ambulation/Gait Ambulation/Gait assistance: Min assist Ambulation Distance (Feet): 150 Feet Assistive  device: Rolling walker (2 wheeled) Gait Pattern/deviations: Step-through pattern;Decreased stride length;Shuffle;Trunk flexed Gait velocity: slowed Gait velocity interpretation: Below normal speed for age/gender General Gait Details: cues for upright posture and staying within RW      Balance Overall balance assessment: Needs assistance;History of Falls Sitting-balance support: No upper extremity supported;Feet supported Sitting balance-Leahy Scale: Good     Standing balance support: No upper extremity supported;During functional activity Standing balance-Leahy Scale: Fair Standing balance comment: able to perform pericare                            Cognition Arousal/Alertness: Awake/alert Behavior During Therapy: WFL for tasks assessed/performed Overall Cognitive Status: Within Functional Limits for tasks assessed                                           General Comments General comments (skin integrity, edema, etc.): VSS       Pertinent Vitals/Pain Pain Assessment: Faces Faces Pain Scale: Hurts a little bit Pain Location: back, "I got rods in my back" Pain Descriptors / Indicators: Aching Pain Intervention(s): Monitored during session;Limited activity within patient's tolerance           PT Goals (current goals can now be found in the care plan section) Acute Rehab PT Goals PT Goal Formulation: With patient Time For Goal Achievement: 05/13/17 Potential to Achieve Goals: Good Progress towards PT goals: Progressing toward goals    Frequency    Min 3X/week      PT  Plan Current plan remains appropriate       AM-PAC PT "6 Clicks" Daily Activity  Outcome Measure  Difficulty turning over in bed (including adjusting bedclothes, sheets and blankets)?: A Lot Difficulty moving from lying on back to sitting on the side of the bed? : Unable Difficulty sitting down on and standing up from a chair with arms (e.g., wheelchair, bedside  commode, etc,.)?: A Little Help needed moving to and from a bed to chair (including a wheelchair)?: A Little Help needed walking in hospital room?: A Little Help needed climbing 3-5 steps with a railing? : A Lot 6 Click Score: 14    End of Session   Activity Tolerance: Patient limited by fatigue Patient left: in bed;with call bell/phone within reach Nurse Communication: Mobility status PT Visit Diagnosis: Unsteadiness on feet (R26.81);Muscle weakness (generalized) (M62.81)     Time: 3383-2919 PT Time Calculation (min) (ACUTE ONLY): 11 min  Charges:  $Gait Training: 8-22 mins                    G Codes:       [name] B. Migdalia Dk PT, DPT Acute Rehabilitation  [phone] Pager [phone]     [place] 05/02/2017, 3:44 PM

## 2017-05-02 NOTE — Progress Notes (Signed)
pts cbg 169

## 2017-05-02 NOTE — Care Management Important Message (Signed)
Important Message  Patient Details  Name: [name] MRN: [id number] Date of Birth: 05-Dec-1933   Medicare Important Message Given:  Yes    Saroya Riccobono 05/02/2017, 11:13 AM

## 2017-05-02 NOTE — Progress Notes (Signed)
Advanced Heart Failure Rounding Note  PCP: Dr Elease Hashimoto Primary Cardiologist: Dr Harrington Challenger   Subjective:    Pt underwent CABG x 1 and MVR replacement 04/23/17. Milrinone added 04/27/17 with Mixed venous saturation of 33%.   Slightly lightheaded with standing. Mild SOB walking halls. Denies CP. Anxious to go home, asks if she will be going today.   Coox 56.4% this am on 0.3 mcg/kg/min milrinone.   Negative 370 cc and down 3 more lbs.    CVP not currently connected and connection fractured. Have asked nurse to replace and page when able to be checked.   Objective:   Weight Range: 132 lb 15 oz (60.3 kg) Body mass index is 21.14 kg/m.   Vital Signs:   Temp:  [97.4 F (36.3 C)-98.6 F (37 C)] 98.1 F (36.7 C) (08/23 0822) Pulse Rate:  [26-118] 118 (08/23 0654) Resp:  [14-27] 19 (08/23 0654) BP: (80-140)/(45-77) 113/58 (08/23 0654) SpO2:  [93 %-100 %] 96 % (08/23 0654) Weight:  [132 lb 15 oz (60.3 kg)-139 lb (63 kg)] 132 lb 15 oz (60.3 kg) (08/23 0822) Last BM Date: 04/30/17  Weight change: Filed Weights   05/01/17 1100 05/02/17 0654 05/02/17 0822  Weight: 135 lb (61.2 kg) 139 lb (63 kg) 132 lb 15 oz (60.3 kg)    Intake/Output:   Intake/Output Summary (Last 24 hours) at 05/02/17 0840 Last data filed at 05/02/17 0656  Gross per 24 hour  Intake            524.2 ml  Output              900 ml  Net           -375.8 ml      Physical Exam  CVP not currently connected and connection fractured. Nurse to replace and page.   General: Elderly. NAD. In bed.  HEENT: Normal Neck: Supple. JVP 8-9 cm at least. Carotids 2+ bilat; no bruits. No thyromegaly or nodule noted. Cor: PMI nondisplaced. Irregularly irregular. No M/G/R noted Lungs: CTAB on room air.  Abdomen: Soft, non-tender, non-distended, no HSM. No bruits or masses. +BS  Extremities: No cyanosis, clubbing, or rash. BLE SCDs. No peripheral edema. RUE PICC site stable.   Neuro: Alert & orientedx3, cranial nerves  grossly intact. moves all 4 extremities w/o difficulty. Affect pleasant   Telemetry    Personally reviewed, Afib 90s   EKG    04/28/2017 A fib 115 bpm.   Labs    CBC  Recent Labs  05/01/17 0753 05/02/17 0359  WBC 7.7 7.7  HGB 9.6* 9.8*  HCT 28.7* 29.6*  MCV 90.3 90.0  PLT 90* 623*   Basic Metabolic Panel  Recent Labs  05/01/17 0547 05/01/17 0753 05/02/17 0359  NA 132*  --  130*  K 4.0  --  2.9*  CL 97*  --  93*  CO2 29  --  29  GLUCOSE 154*  --  119*  BUN 9  --  9  CREATININE 0.62  --  0.68  CALCIUM 8.1*  --  7.8*  MG  --  1.5* 1.5*   Liver Function Tests  Recent Labs  04/30/17 0500 05/02/17 0359  AST 481* 69*  ALT 1,809* 765*  ALKPHOS 89 79  BILITOT 1.5* 1.1  PROT 5.4* 5.3*  ALBUMIN 2.7* 2.6*   No results for input(s): LIPASE, AMYLASE in the last 72 hours. Cardiac Enzymes No results for input(s): CKTOTAL, CKMB, CKMBINDEX, TROPONINI in the last 72 hours.  BNP: BNP (last 3 results) No results for input(s): BNP in the last 8760 hours.  ProBNP (last 3 results) No results for input(s): PROBNP in the last 8760 hours.   D-Dimer No results for input(s): DDIMER in the last 72 hours. Hemoglobin A1C No results for input(s): HGBA1C in the last 72 hours. Fasting Lipid Panel No results for input(s): CHOL, HDL, LDLCALC, TRIG, CHOLHDL, LDLDIRECT in the last 72 hours. Thyroid Function Tests No results for input(s): TSH, T4TOTAL, T3FREE, THYROIDAB in the last 72 hours.  Invalid input(s): FREET3  Other results:   Imaging    Dg Chest 2 View  Result Date: 05/02/2017 CLINICAL DATA:  CHF EXAM: CHEST  2 VIEW COMPARISON:  05/01/2017 FINDINGS: Right PICC line remains in place, unchanged. Changes of prior valve replacement. Left lower lobe atelectasis or infiltrate and areas of right perihilar atelectasis again noted, stable. Mild elevation of the right hemidiaphragm. No visible effusions or pneumothorax. IMPRESSION: Left lower lobe atelectasis or infiltrate  and right perihilar atelectasis. No significant change. Electronically Signed   By: Rolm Baptise M.D.   On: 05/02/2017 07:50     Medications:     Scheduled Medications: . amiodarone  400 mg Oral BID  . aspirin EC  81 mg Oral Daily  . bisacodyl  10 mg Oral Daily   Or  . bisacodyl  10 mg Rectal Daily  . Chlorhexidine Gluconate Cloth  6 each Topical Q0600  . citalopram  20 mg Oral Daily  . docusate sodium  200 mg Oral Daily  . furosemide  40 mg Oral BID  . insulin aspart  0-24 Units Subcutaneous TID AC & HS  . mouth rinse  15 mL Mouth Rinse BID  . pantoprazole  40 mg Oral Daily  . potassium chloride  80 mEq Oral Once  . sacubitril-valsartan  1 tablet Oral BID  . sodium chloride flush  10-40 mL Intracatheter Q12H  . sodium chloride flush  10-40 mL Intracatheter Q12H  . sodium chloride flush  3 mL Intravenous Q12H  . sodium chloride flush  3 mL Intravenous Q12H  . sodium chloride flush  3 mL Intravenous Q12H  . spironolactone  12.5 mg Oral Daily  . warfarin  1 mg Oral q1800  . warfarin  2 mg Oral ONCE-1800  . Warfarin - Physician Dosing Inpatient   Does not apply q1800    Infusions: . sodium chloride 250 mL (04/24/17 0700)  . sodium chloride    . sodium chloride    . lactated ringers Stopped (04/24/17 0800)  . milrinone 0.3 mcg/kg/min (05/01/17 1554)  . potassium chloride Stopped (04/30/17 2100)    PRN Medications: sodium chloride, sodium chloride, alum & mag hydroxide-simeth, clonazePAM, ibuprofen, metoprolol tartrate, ondansetron (ZOFRAN) IV, potassium chloride, sodium chloride flush, sodium chloride flush, sodium chloride flush, sodium chloride flush, traMADol    Patient Profile   [name] is a 83 y.o. female with h/o MVP, HTN, DM2, scoliosis, and CAD. She had developed DOE over the past 2-3 months along with occasional peripheral edem   S/p MVR and CABG x 1 04/23/17. HF consulted post op with low mixed venous saturation and volume overload.   Assessment/Plan     1. Acute Combined Biventricular HF- EF dropped post op-->EF 25-30% with RV dysfunction.  - CO-OX 56.4%. Hold off on coreg.  - Continue milrinone 0.3 mcg/kg/min for now.  - CVP pending line replacement.  - Continue lasix 40 mg po. Volume status looks OK.   - Increase spiro  to 25 mg qhs.  - Continue entresto 24-26 mg BID. Will not up-titrate with lightheadedness and relatively soft pressures.  - Creatinine stable. Hold off on carvedilol.  - Continue ted hose.     2. S/P CABG x1 MVR - bioprosthetic valve and LA clipping.  - Stable.  3. Shock Liver versus Passive Congestion - LFTs remains elevated but much improved.    4 A fib RVR- Remains in A fib with controlled rated. Continue amio drip 30 mg per hour. Coumadin per Dr Roxy Manns . INR 1.32. 5. Hypokalemia- K 2.9 this am. Supp ordered. Recheck this pm.  6. Hypomagnesium - Mg 1.5 this am. Given 4 g yesterday. Will re-supp today and follow.  7. Post op anemia-  Hgb 9.8 this am.  8. Deconditioning - Consider CIR.   Recheck Coox this afternoon with CVP to consider wean.  Would ideally have off milrinone vs on low dose before DCCV attempted.   Addendum: CVP down to ~ 10. Continue po lasix. Transitioning to po amio per TCTS.   Length of Stay: [place], [place]  05/02/2017, 8:40 AM  Advanced Heart Failure Team Pager [phone] (M-F; 7a - 4p)  Please contact [facility] for night-coverage after hours (4p -7a ) and weekends on [email or web address]  Patient seen and examined with the above-signed Advanced Practice Provider and/or Housestaff. I personally reviewed laboratory data, imaging studies and relevant notes. I independently examined the patient and formulated the important aspects of the plan. I have edited the note to reflect any of my changes or salient points. I have personally discussed the plan with the patient and/or family.  Off dopa. Remains on milrinone 0.3 but co-ox marginal today. Volume status ok with CVP 10 (would not go  much below this). Remains in AF - rate controlled. Given presence of new MV, I would be very reluctant to send her home with a PICC line and IV milrinone due to risk of infection. Thus, need to work hard to get milrinone off but avoid recurrent cardiogenic shock. Can consider sildenafil but hold off for now. Would likely benefit from NSR but need to wait until INR therapeutic for TEE/DC-CV. Supp K and Mg. Continue rehab. Her daughter-in-law who is an Therapist, sports is coming tomorrow to help.   Glori Bickers, MD  9:18 PM

## 2017-05-02 NOTE — Consult Note (Signed)
[facility] CM Primary Care Navigator  05/02/2017  [name] 08/27/1934 [id number]    Met with patient and husband [name]) at the bedside to identify possible discharge needs. Patient reports having symptoms of exertional shortness of breath and chest pressure/ pain   that had led to this admission. Patient endorses Dr. Carolann Littler with [facility] as the primary care provider.   Patient shared using [facility] service and [facility] [place] to obtain medications without difficulty so far ("not sure what medications will be given at discharge"). Patient and husband were made aware to get referral to [facility] CM from primary care provider if she starts having issues with medications.  Patient reports managing herown medications at home straight out of the containers.  Patient mentioned that husband provides transportationto herdoctors'appointments.   Husband is the primary caregiver at home as stated.  According to patient, anticipated discharge plan is home with home health services per therapy recommendation.   Patient and husband expressedunderstanding to call primary care provider's officewhen she returns home,for a post discharge follow-up appointment within a week or sooner if needed.Patient letter (with PCP's contact number) was provided as a reminder.  Explained to patient and husband about [facility] CM services available for healthmanagement but she reports managing her chronic illnesses at home with husband's help. Patient opted and verbally agreed with [facility] to follow-up with recovery when she returns home.  Patient and husband voiced understanding to seek referral to [facility] care managementservicesfrom primary care provider if deemed necessaryin the future.   [facility] care management information provided for future needs that may arise.   For questions, please contact:  Dannielle Huh, BSN, RN-  [facility] Primary Care Navigator  Telephone: [phone] [place]

## 2017-05-03 LAB — COOXEMETRY PANEL
CARBOXYHEMOGLOBIN: 1.2 % (ref 0.5–1.5)
Methemoglobin: 1.2 % (ref 0.0–1.5)
O2 SAT: 62.6 %
Total hemoglobin: 10.2 g/dL — ABNORMAL LOW (ref 12.0–16.0)

## 2017-05-03 LAB — PROTIME-INR
INR: 1.51
Prothrombin Time: 18.4 seconds — ABNORMAL HIGH (ref 11.4–15.2)

## 2017-05-03 LAB — BASIC METABOLIC PANEL
Anion gap: 13 (ref 5–15)
BUN: 10 mg/dL (ref 6–20)
CALCIUM: 8.1 mg/dL — AB (ref 8.9–10.3)
CHLORIDE: 93 mmol/L — AB (ref 101–111)
CO2: 25 mmol/L (ref 22–32)
CREATININE: 0.73 mg/dL (ref 0.44–1.00)
GFR calc Af Amer: >60 mL/min (ref >60)
GFR calc non Af Amer: >60 mL/min (ref >60)
Glucose, Bld: 118 mg/dL — ABNORMAL HIGH (ref 65–99)
Potassium: 3.7 mmol/L (ref 3.5–5.1)
SODIUM: 131 mmol/L — AB (ref 135–145)

## 2017-05-03 LAB — GLUCOSE, CAPILLARY
GLUCOSE-CAPILLARY: 137 mg/dL — AB (ref 65–99)
GLUCOSE-CAPILLARY: 161 mg/dL — AB (ref 65–99)
GLUCOSE-CAPILLARY: 99 mg/dL (ref 65–99)
Glucose-Capillary: 142 mg/dL — ABNORMAL HIGH (ref 65–99)

## 2017-05-03 LAB — MAGNESIUM: MAGNESIUM: 1.9 mg/dL (ref 1.7–2.4)

## 2017-05-03 MED ORDER — INSULIN ASPART 100 UNIT/ML ~~LOC~~ SOLN
0.0000 [IU] | Freq: Three times a day (TID) | SUBCUTANEOUS | Status: DC
Start: 2017-05-03 — End: 2017-05-04
  Administered 2017-05-03: 2 [IU] via SUBCUTANEOUS
  Administered 2017-05-04: 1 [IU] via SUBCUTANEOUS

## 2017-05-03 MED ORDER — POTASSIUM CHLORIDE CRYS ER 20 MEQ PO TBCR
30.0000 meq | EXTENDED_RELEASE_TABLET | Freq: Once | ORAL | Status: AC
  Administered 2017-05-03: 30 meq via ORAL
  Filled 2017-05-03: qty 1

## 2017-05-03 MED ORDER — FUROSEMIDE 40 MG PO TABS
40.0000 mg | ORAL_TABLET | Freq: Every day | ORAL | Status: DC
  Administered 2017-05-03 – 2017-05-05 (×3): 40 mg via ORAL
  Filled 2017-05-03 (×3): qty 1

## 2017-05-03 MED ORDER — HYDROCODONE-ACETAMINOPHEN 5-325 MG PO TABS
0.5000 | ORAL_TABLET | Freq: Four times a day (QID) | ORAL | Status: DC | PRN
  Administered 2017-05-03 – 2017-05-04 (×2): 0.5 via ORAL
  Filled 2017-05-03 (×2): qty 1

## 2017-05-03 MED ORDER — MAGNESIUM SULFATE 2 GM/50ML IV SOLN
2.0000 g | Freq: Once | INTRAVENOUS | Status: AC
  Administered 2017-05-03: 2 g via INTRAVENOUS
  Filled 2017-05-03: qty 50

## 2017-05-03 MED ORDER — WARFARIN SODIUM 2 MG PO TABS
2.0000 mg | ORAL_TABLET | Freq: Once | ORAL | Status: AC
  Administered 2017-05-03: 2 mg via ORAL
  Filled 2017-05-03: qty 1

## 2017-05-03 NOTE — Progress Notes (Addendum)
[address]            [place] [address]          [phone]   10 Days Post-Op Procedure(s) (LRB): MITRAL VALVE (MV) REPLACEMENT (N/A) CORONARY ARTERY BYPASS GRAFTING (CABG), ON PUMP, TIMES ONE, USING ENDOSCOPICALLY HARVESTED RIGHT GREATER SAPHENOUS VEIN (N/A) TRANSESOPHAGEAL ECHOCARDIOGRAM (TEE) (N/A) CLIPPING OF LEFT ATRIAL APPENDAGE  Total Length of Stay:  LOS: 10 days   Subjective: Patient has no specific complaints this am but wants to know "the plan".  Objective: Vital signs in last 24 hours: Temp:  [97.4 F (36.3 C)-98.8 F (37.1 C)] 98.8 F (37.1 C) (08/24 0401) Pulse Rate:  [66-93] 66 (08/24 0401) Cardiac Rhythm: Atrial fibrillation (08/23 2000) Resp:  [13-18] 18 (08/24 0401) BP: (92-120)/(57-62) 120/62 (08/24 0401) SpO2:  [90 %-99 %] 98 % (08/24 0401) Weight:  [132 lb 4.4 oz (60 kg)-132 lb 15 oz (60.3 kg)] 132 lb 4.4 oz (60 kg) (08/24 0401)  Filed Weights   05/02/17 0654 05/02/17 0822 05/03/17 0401  Weight: 139 lb (63 kg) 132 lb 15 oz (60.3 kg) 132 lb 4.4 oz (60 kg)    Intake/Output from previous day: 08/23 0701 - 08/24 0700 In: 1340.1 [P.O.:720; I.V.:500.1; IV Piggyback:120] Out: 450 [Urine:450]  Heart: IRRR IRRR Lungs: Mostly clear Abdomen: Soft, non tender, bowel sounds present Extremities: Trace bilateral LE edema Wound: Clean and dry  Lab Results: CBC:  Recent Labs  05/01/17 0753 05/02/17 0359  WBC 7.7 7.7  HGB 9.6* 9.8*  HCT 28.7* 29.6*  PLT 90* 132*   BMET:   Recent Labs  05/02/17 0359 05/03/17 0400  NA 130* 131*  K 2.9* 3.7  CL 93* 93*  CO2 29 25  GLUCOSE 119* 118*  BUN 9 10  CREATININE 0.68 0.73  CALCIUM 7.8* 8.1*    CMET: Lab Results  Component Value Date   WBC 7.7 05/02/2017   HGB 9.8 (L) 05/02/2017   HCT 29.6 (L) 05/02/2017   PLT 132 (L) 05/02/2017   GLUCOSE 118 (H) 05/03/2017   CHOL 163 08/22/2016   TRIG 101 08/22/2016   HDL 65 08/22/2016   LDLDIRECT 143.6 06/04/2012   LDLCALC 78 08/22/2016   ALT 765 (H) 05/02/2017   AST 69 (H) 05/02/2017   NA 131 (L) 05/03/2017   K 3.7 05/03/2017   CL 93 (L) 05/03/2017   CREATININE 0.73 05/03/2017   BUN 10 05/03/2017   CO2 25 05/03/2017   TSH 0.75 09/20/2014   INR 1.51 05/03/2017   HGBA1C 6.2 (H) 04/19/2017   MICROALBUR 0.2 05/21/2014      PT/INR:   Recent Labs  05/03/17 0400  LABPROT 18.4*  INR 1.51   Radiology: Dg Chest 2 View  Result Date: 05/02/2017 CLINICAL DATA:  CHF EXAM: CHEST  2 VIEW COMPARISON:  05/01/2017 FINDINGS: Right PICC line remains in place, unchanged. Changes of prior valve replacement. Left lower lobe atelectasis or infiltrate and areas of right perihilar atelectasis again noted, stable. Mild elevation of the right hemidiaphragm. No visible effusions or pneumothorax. IMPRESSION: Left lower lobe atelectasis or infiltrate and right perihilar atelectasis. No significant change. Electronically Signed   By: Rolm Baptise M.D.   On: 05/02/2017 07:50     Assessment/Plan: S/P Procedure(s) (LRB): MITRAL VALVE (MV) REPLACEMENT (N/A) CORONARY ARTERY BYPASS GRAFTING (CABG), ON PUMP, TIMES ONE, USING ENDOSCOPICALLY HARVESTED RIGHT GREATER SAPHENOUS VEIN (N/A) TRANSESOPHAGEAL  ECHOCARDIOGRAM (TEE) (N/A) CLIPPING OF LEFT ATRIAL APPENDAGE  1. CV-A fib with CVR. On Milrinone drip. Co ox 62.6 this am. Management of drip per heart failure. On Amiodarone 400 mg bid, Entresto 24/26 mg bid,Spironolactone 25 mg daily and Coumadin. INR increased from 1.3 to 1.51. Will give 2 mg again tonight. 2. Pulmonary-On 2 liters of oxygen via [place].Will wean as able over next several days.  Encourage incentive spirometer 3. Acute combined biventricular heart failure-on Lasix 40 mg PO bid 4. ABL anemia- H and H yesterday 9.8 and 29.6 5. Thrombocytopenia- platelets up to 132,000. 6. CBGs 115/190/137. Pre op HGA1C 6.2. On Metformin pre op and will restart at discharge as appetite "so so".  7. Hyponatremia-sodium up to 131 and  is related to diuresis. Monitor. 8. Elevated transaminases but decreasing-continue to monitor 9. Supplement potassium  ZIMMERMAN,DONIELLE M PA-C 05/03/2017 7:16 AM   I have seen and examined the patient and agree with the assessment and plan as outlined.  I favor trying to wean milrinone off over next 24-48 hours but will defer to AHF team.  Could consider DCCV if she doesn't tolerate weaning milrinone further at this time.  Hopefully ready for d/c home by early next week  [name], MD 05/03/2017 8:44 AM

## 2017-05-03 NOTE — Discharge Instructions (Addendum)
Coronary Artery Bypass Grafting, Care After °These instructions give you information on caring for yourself after your procedure. Your doctor may also give you more specific instructions. Call your doctor if you have any problems or questions after your procedure. °Follow these instructions at home: °· Only take medicine as told by your doctor. Take medicines exactly as told. Do not stop taking medicines or start any new medicines without talking to your doctor first. °· Take your pulse as told by your doctor. °· Do deep breathing as told by your doctor. Use your breathing device (incentive spirometer), if given, to practice deep breathing several times a day. Support your chest with a pillow or your arms when you take deep breaths or cough. °· Keep the area clean, dry, and protected where the surgery cuts (incisions) were made. Remove bandages (dressings) only as told by your doctor. If strips were applied to surgical area, do not take them off. They fall off on their own. °· Check the surgery area daily for puffiness (swelling), redness, or leaking fluid. °· If surgery cuts were made in your legs: °? Avoid crossing your legs. °? Avoid sitting for long periods of time. Change positions every 30 minutes. °? Raise your legs when you are sitting. Place them on pillows. °· Wear stockings that help keep blood clots from forming in your legs (compression stockings). °· Only take sponge baths until your doctor says it is okay to take showers. Pat the surgery area dry. Do not rub the surgery area with a washcloth or towel. Do not bathe, swim, or use a hot tub until your doctor says it is okay. °· Eat foods that are high in fiber. These include raw fruits and vegetables, whole grains, beans, and nuts. Choose lean meats. Avoid canned, processed, and fried foods. °· Drink enough fluids to keep your pee (urine) clear or pale yellow. °· Weigh yourself every day. °· Rest and limit activity as told by your doctor. You may be told  to: °? Stop any activity if you have chest pain, shortness of breath, changes in heartbeat, or dizziness. Get help right away if this happens. °? Move around often for short amounts of time or take short walks as told by your doctor. Gradually become more active. You may need help to strengthen your muscles and build endurance. °? Avoid lifting, pushing, or pulling anything heavier than 10 pounds (4.5 kg) for at least 6 weeks after surgery. °· Do not drive until your doctor says it is okay. °· Ask your doctor when you can go back to work. °· Ask your doctor when you can begin sexual activity again. °· Follow up with your doctor as told. °Contact a doctor if: °· You have puffiness, redness, more pain, or fluid draining from the incision site. °· You have a fever. °· You have puffiness in your ankles or legs. °· You have pain in your legs. °· You gain 2 or more pounds (0.9 kg) a day. °· You feel sick to your stomach (nauseous) or throw up (vomit). °· You have watery poop (diarrhea). °Get help right away if: °· You have chest pain that goes to your jaw or arms. °· You have shortness of breath. °· You have a fast or irregular heartbeat. °· You notice a "clicking" in your breastbone when you move. °· You have numbness or weakness in your arms or legs. °· You feel dizzy or light-headed. °This information is not intended to replace advice given to you by   your health care provider. Make sure you discuss any questions you have with your health care provider. Document Released: 09/01/2013 Document Revised: 02/02/2016 Document Reviewed: 02/03/2013 Elsevier Interactive Patient Education  2017 [employer]  Mitral Valve Replacement, Care After This sheet gives you information about how to care for yourself after your procedure. Your health care provider may also give you more specific instructions. If you have problems or questions, contact your health care provider. What can I expect after the procedure? After the  procedure, it is common to have:  Pain at the incision area that may last for several weeks.  Follow these instructions at home: Incision care  Follow instructions from your health care provider about how to take care of your incision. Make sure you: ? Wash your hands with soap and water before you change your bandage (dressing). If soap and water are not available, use hand sanitizer. ? Change your dressing as told by your health care provider. ? Leave stitches (sutures), skin glue, or adhesive strips in place. These skin closures may need to stay in place for 2 weeks or longer. If adhesive strip edges start to loosen and curl up, you may trim the loose edges. Do not remove adhesive strips completely unless your health care provider tells you to do that.  Check your incision area every day for signs of infection. Check for: ? More redness, swelling, or pain. ? More fluid or blood. ? Warmth. ? Pus or a bad smell.  Do not apply powder or lotion to the area. Driving  Do not drive until your health care provider approves.  Do not drive or use heavy machinery while taking prescription pain medicines. Bathing  Do not take baths, swim, or use a hot tub for 2-4 weeks after surgery, or until your health care provider approves. Ask your health care provider if you may take showers.  To wash the incision site, gently wash with soap and water and pat the area dry with a clean towel. Do not rub the incision area. That may cause bleeding. Activity  Rest as told by your health care provider. Ask your health care provider when you can resume normal activities, including sexual activity.  Avoid the following activities for 6-8 weeks, or as long as directed: ? Lifting anything that is heavier than 10 lb (4.5 kg), or the limit that your health care provider tells you. ? Pushing or pulling things with your arms.  Avoid climbing stairs and using the handrail to pull yourself up for the first 2-3  weeks after surgery.  Avoid airplane travel for 4-6 weeks, or as long as directed.  Avoid sitting for long periods of time and crossing your legs. Get up and move around at least once every 1-2 hours.  If you are taking blood thinners (anticoagulants), avoid activities that have a high risk of injury. Ask your health care provider what activities are safe for you. Lifestyle  Limit alcohol intake to no more than 1 drink a day for nonpregnant women and 2 drinks a day for men. One drink equals 12 oz of beer, 5 oz of wine, or 1 oz of hard liquor.  Do not use any products that contain nicotine or tobacco, such as cigarettes and e-cigarettes. If you need help quitting, ask your health care provider. General instructions  Take your temperature every day and weigh yourself every morning for the first 7 days after surgery. Write your temperatures and weight down and take this record with  you to any follow-up visits.  Take over-the-counter and prescription medicines only as told by your health care provider.  To prevent or treat constipation while you are taking prescription pain medicine, your health care provider may recommend that you: ? Drink enough fluid to keep your urine clear or pale yellow. ? Take over-the-counter or prescription medicines. ? Eat foods that are high in fiber, such as fresh fruits and vegetables, whole grains, and beans. ? Limit foods that are high in fat and processed sugars, such as fried and sweet foods.  Follow instructions from your health care provider about eating or drinking restrictions.  Wear compression stockings for at least 2 weeks, or as long as told by your health care provider. These stockings help to prevent blood clots and reduce swelling in your legs. If your ankles are swollen after 2 weeks, continue to wear the stockings.  Keep all follow-up visits as told by your health care provider. This is important. Contact a health care provider if:  You  develop a skin rash.  Your weight is increasing each day over 2-3 days.  You gain 2 lb (1 kg) or more in a single day.  You have a fever. Get help right away if:  You develop chest pain that feels different from the pain caused by your incision.  You develop shortness of breath or difficulty breathing.  You have more redness, swelling, or pain around your incision.  You have more fluid or blood coming from your incision.  Your incision feels warm to the touch.  You have pus or a bad smell coming from your incision.  You feel light-headed. This information is not intended to replace advice given to you by your health care provider. Make sure you discuss any questions you have with your health care provider. Document Released: 03/16/2005 Document Revised: 06/08/2016 Document Reviewed: 06/08/2016 Elsevier Interactive Patient Education  2018 [place] on my medicine - Coumadin   (Warfarin)  This medication education was reviewed with me or my healthcare representative as part of my discharge preparation.   Why was Coumadin prescribed for you? Coumadin was prescribed for you because you have a blood clot or a medical condition that can cause an increased risk of forming blood clots. Blood clots can cause serious health problems by blocking the flow of blood to the heart, lung, or brain. Coumadin can prevent harmful blood clots from forming. As a reminder your indication for Coumadin is:   Stroke Prevention Because Of Atrial Fibrillation  What test will check on my response to Coumadin? While on Coumadin (warfarin) you will need to have an INR test regularly to ensure that your dose is keeping you in the desired range. The INR (international normalized ratio) number is calculated from the result of the laboratory test called prothrombin time (PT).  If an INR APPOINTMENT HAS NOT ALREADY BEEN MADE FOR YOU please schedule an appointment to have this lab work done by  your health care provider within 7 days. Your INR goal is a number between:  2 to 3.  What  do you need to  know  About  COUMADIN? Take Coumadin (warfarin) exactly as prescribed by your healthcare provider about the same time each day.  DO NOT stop taking without talking to the doctor who prescribed the medication.  Stopping without other blood clot prevention medication to take the place of Coumadin may increase your risk of developing a new clot or stroke.  Get refills before  you run out.  What do you do if you miss a dose? If you miss a dose, take it as soon as you remember on the same day then continue your regularly scheduled regimen the next day.  Do not take two doses of Coumadin at the same time.  Important Safety Information A possible side effect of Coumadin (Warfarin) is an increased risk of bleeding. You should call your healthcare provider right away if you experience any of the following: ? Bleeding from an injury or your nose that does not stop. ? Unusual colored urine (red or dark brown) or unusual colored stools (red or black). ? Unusual bruising for unknown reasons. ? A serious fall or if you hit your head (even if there is no bleeding).  Some foods or medicines interact with Coumadin (warfarin) and might alter your response to warfarin. To help avoid this: ? Eat a balanced diet, maintaining a consistent amount of Vitamin K. ? Notify your provider about major diet changes you plan to make. ? Avoid alcohol or limit your intake to 1 drink for women and 2 drinks for men per day. (1 drink is 5 oz. wine, 12 oz. beer, or 1.5 oz. liquor.)  Make sure that ANY health care provider who prescribes medication for you knows that you are taking Coumadin (warfarin).  Also make sure the healthcare provider who is monitoring your Coumadin knows when you have started a new medication including herbals and non-prescription products.  Coumadin (Warfarin)  Major Drug Interactions  Increased  Warfarin Effect Decreased Warfarin Effect  Alcohol (large quantities) Antibiotics (esp. Septra/Bactrim, Flagyl, Cipro) Amiodarone (Cordarone) Aspirin (ASA) Cimetidine (Tagamet) Megestrol (Megace) NSAIDs (ibuprofen, naproxen, etc.) Piroxicam (Feldene) Propafenone (Rythmol SR) Propranolol (Inderal) Isoniazid (INH) Posaconazole (Noxafil) Barbiturates (Phenobarbital) Carbamazepine (Tegretol) Chlordiazepoxide (Librium) Cholestyramine (Questran) Griseofulvin Oral Contraceptives Rifampin Sucralfate (Carafate) Vitamin K   Coumadin (Warfarin) Major Herbal Interactions  Increased Warfarin Effect Decreased Warfarin Effect  Garlic Ginseng Ginkgo biloba Coenzyme Q10 Green tea [place]. Johns wort    Coumadin (Warfarin) FOOD Interactions  Eat a consistent number of servings per week of foods HIGH in Vitamin K (1 serving =  cup)  Collards (cooked, or boiled & drained) Kale (cooked, or boiled & drained) Mustard greens (cooked, or boiled & drained) Parsley *serving size only =  cup Spinach (cooked, or boiled & drained) Swiss chard (cooked, or boiled & drained) Turnip greens (cooked, or boiled & drained)  Eat a consistent number of servings per week of foods MEDIUM-HIGH in Vitamin K (1 serving = 1 cup)  Asparagus (cooked, or boiled & drained) Broccoli (cooked, boiled & drained, or raw & chopped) Brussel sprouts (cooked, or boiled & drained) *serving size only =  cup Lettuce, raw (green leaf, endive, romaine) Spinach, raw Turnip greens, raw & chopped   These websites have more information on Coumadin (warfarin):  FailFactory.se; VeganReport.com.au;

## 2017-05-03 NOTE — Progress Notes (Addendum)
Advanced Heart Failure Rounding Note  PCP: Dr Elease Hashimoto Primary Cardiologist: Dr Harrington Challenger   Subjective:    Pt underwent CABG x 1 and MVR replacement 04/23/17. Milrinone added 04/27/17 with Mixed venous saturation of 33%.   Feeling somewhat better. Denies lightheadedness or SOB walking in room. Primary complaint is fatigue.   [place]ox 62.6% this am on 0.3 mcg/kg/min milrinone.   Creatinine stable. K 3.7. Mg 1.9.  CVP 4-5. Weight stable.   Objective:   Weight Range: 132 lb 4.4 oz (60 kg) Body mass index is 21.03 kg/m.   Vital Signs:   Temp:  [97.4 F (36.3 C)-98.8 F (37.1 C)] 98.8 F (37.1 C) (08/24 0401) Pulse Rate:  [66-93] 66 (08/24 0401) Resp:  [13-18] 18 (08/24 0401) BP: (92-120)/(57-62) 120/62 (08/24 0401) SpO2:  [90 %-99 %] 98 % (08/24 0401) Weight:  [132 lb 4.4 oz (60 kg)-132 lb 15 oz (60.3 kg)] 132 lb 4.4 oz (60 kg) (08/24 0401) Last BM Date: 05/01/17  Weight change: Filed Weights   05/02/17 0654 05/02/17 0822 05/03/17 0401  Weight: 139 lb (63 kg) 132 lb 15 oz (60.3 kg) 132 lb 4.4 oz (60 kg)    Intake/Output:   Intake/Output Summary (Last 24 hours) at 05/03/17 0748 Last data filed at 05/03/17 0600  Gross per 24 hour  Intake           1340.1 ml  Output              450 ml  Net            890.1 ml      Physical Exam  CVP 4-5 on my personal check.   General:Elderly and fatigued appearing. No resp difficulty. HEENT: Normal Neck: Supple. JVP not elevated. Carotids 2+ bilat; no bruits. No thyromegaly or nodule noted. Cor: PMI nondisplaced. Irregularly irregular. No M/G/R noted Lungs: CTAB, normal effort. Abdomen: Soft, non-tender, non-distended, no HSM. No bruits or masses. +BS  Extremities: No cyanosis, clubbing, rash, R and LLE no edema. BLE SCDs. RUE PICC site stable.  Neuro: Alert & orientedx3, cranial nerves grossly intact. moves all 4 extremities w/o difficulty. Affect pleasant   Telemetry    Personally reviewed, Afib 80-90s  EKG    04/28/2017  A fib 115 bpm.   Labs    CBC  Recent Labs  05/01/17 0753 05/02/17 0359  WBC 7.7 7.7  HGB 9.6* 9.8*  HCT 28.7* 29.6*  MCV 90.3 90.0  PLT 90* 030*   Basic Metabolic Panel  Recent Labs  05/02/17 0359 05/03/17 0400  NA 130* 131*  K 2.9* 3.7  CL 93* 93*  CO2 29 25  GLUCOSE 119* 118*  BUN 9 10  CREATININE 0.68 0.73  CALCIUM 7.8* 8.1*  MG 1.5* 1.9   Liver Function Tests  Recent Labs  05/02/17 0359  AST 69*  ALT 765*  ALKPHOS 79  BILITOT 1.1  PROT 5.3*  ALBUMIN 2.6*   No results for input(s): LIPASE, AMYLASE in the last 72 hours. Cardiac Enzymes No results for input(s): CKTOTAL, CKMB, CKMBINDEX, TROPONINI in the last 72 hours.  BNP: BNP (last 3 results) No results for input(s): BNP in the last 8760 hours.  ProBNP (last 3 results) No results for input(s): PROBNP in the last 8760 hours.   D-Dimer No results for input(s): DDIMER in the last 72 hours. Hemoglobin A1C No results for input(s): HGBA1C in the last 72 hours. Fasting Lipid Panel No results for input(s): CHOL, HDL, LDLCALC, TRIG, CHOLHDL, LDLDIRECT  in the last 72 hours. Thyroid Function Tests No results for input(s): TSH, T4TOTAL, T3FREE, THYROIDAB in the last 72 hours.  Invalid input(s): FREET3  Other results:   Imaging    No results found.   Medications:     Scheduled Medications: . amiodarone  400 mg Oral BID  . aspirin EC  81 mg Oral Daily  . bisacodyl  10 mg Oral Daily   Or  . bisacodyl  10 mg Rectal Daily  . Chlorhexidine Gluconate Cloth  6 each Topical Q0600  . citalopram  20 mg Oral Daily  . docusate sodium  200 mg Oral Daily  . furosemide  40 mg Oral BID  . insulin aspart  0-9 Units Subcutaneous TID WC  . mouth rinse  15 mL Mouth Rinse BID  . pantoprazole  40 mg Oral Daily  . potassium chloride  30 mEq Oral Once  . sacubitril-valsartan  1 tablet Oral BID  . sodium chloride flush  3 mL Intravenous Q12H  . spironolactone  25 mg Oral QHS  . warfarin  2 mg Oral  ONCE-1800  . warfarin   Does not apply Once  . Warfarin - Physician Dosing Inpatient   Does not apply q1800    Infusions: . sodium chloride    . lactated ringers Stopped (04/24/17 0800)  . milrinone 0.3 mcg/kg/min (05/03/17 0600)  . potassium chloride Stopped (04/30/17 2100)    PRN Medications: sodium chloride, alum & mag hydroxide-simeth, clonazePAM, ibuprofen, metoprolol tartrate, ondansetron (ZOFRAN) IV, potassium chloride, sodium chloride flush, traMADol    Patient Profile   [name] is a 83 y.o. female with h/o MVP, HTN, DM2, scoliosis, and CAD. She had developed DOE over the past 2-3 months along with occasional peripheral edem   S/p MVR and CABG x 1 04/23/17. HF consulted post op with low mixed venous saturation and volume overload.   Assessment/Plan   1. Acute Combined Biventricular HF- EF dropped post op-->EF 25-30% with RV dysfunction.  - CO-OX 62.6% this am on milrinone 0.3 mcg/kg/min. Will decrease milrinone to 0.2 mcg/kg/min.  - CVP 4-5. Per Dr. Haroldine Laws would like closer to 8-10 range. Decrease lasix to 40 mg daily.   - Continue entresto 24-26 mg BID. Will not up-titrate with lightheadedness and relatively soft pressures.  - Creatinine stable. Hold off on carvedilol.  - Continue ted hose.     2. S/P CABG x1 MVR - bioprosthetic valve and LA clipping.  - Stable. 3. Shock Liver versus Passive Congestion - LFTs improved.  4 A fib RVR- Remains in A fib with controlled rated. Continue amio drip 30 mg per hour. Coumadin per Dr Roxy Manns . INR 1.51. 5. Hypokalemia- K 3.7 this am. Continue to follow.  Supp ordered.  6. Hypomagnesium - Mg 1.9 this am. Given 4g again yesterday. Gently supp and follow.  7. Post op anemia-   - Hgb 9.8 05/02/17. No overt bleeding. Continue to follow.  8. Deconditioning - Consider CIR. No change.   Slowly improving. Follow for lightheadedness. May need gentle IVF today. Pressures stable.   Length of Stay: [address]  [name]  05/03/2017, 7:48 AM  Advanced Heart Failure Team Pager [phone] (M-F; 7a - 4p)  Please contact [facility] for night-coverage after hours (4p -7a ) and weekends on [email or web address]

## 2017-05-03 NOTE — Discharge Summary (Signed)
Physician Discharge Summary       [address]       [place] [address]             [phone]    Patient ID: [name] MRN: [id number] DOB/AGE: [birth year] 83 y.o.  Admit date: 04/23/2017 Discharge date: 05/08/2017  Admission Diagnoses: 1. Severe single vessel CAD 2. Severe mitral regurgitation  Active Diagnoses:  1. Hypertension, benign 2. Type 2 diabetes mellitus ([place]) 3. CHF (congestive heart failure) ([place]) 4. Depression 5. GERD (gastroesophageal reflux disease) 6. H/O hiatal hernia 7. Hyperlipidemia 8. PONV (postoperative nausea and vomiting) 9. Arthritis 10. UTI (lower urinary tract infection) 11. Positive TB test 12. Thrombocytopenia 13. ABL anemia 14. A fib 15. Elevated transaminases 16. Hypomagnesemia  Consults: Heart failure  Procedure (s):   Mitral Valve Replacement              Edwards Magna Mitral Bovine Bioprosthetic Tissue Valve (size 35mm, model #7300TFX, serial #[id number])             Clipping of left atrial appendage (Atriclip size 77mm)   Coronary Artery Bypass Grafting x 1              Reversed Greater Saphenous Vein Graft to Distal Right Coronary Artery             Endoscopic Vein Harvest from Right Thigh by Dr. Roxy Manns on 04/23/2017.  History of Presenting Illness: Patient is an 83 year old female with history of mitral valve prolapse, hypertension, type 2 diabetes mellitus without complication, scoliosis of the back status post lumbar fusion, and recently discovered coronary artery disease who has been referred for surgical consultation to discuss management of severe symptomatic primary mitral regurgitation and coronary artery disease. The patient states that she has been told that she had a heart murmur for most of her life and she was diagnosed with mitral valve prolapse many years ago at the [facility]. She was told that eventually she might need to have surgery. She has been physically active and functionally  independent and overall done quite well from a cardiac standpoint until recently. She has been followed for several years by Dr. Harrington Challenger and previous echocardiograms have documented the presence of moderate mitral regurgitation with normal left ventricular systolic function. Over the past 2 or 3 months the patient has developed symptoms of exertional shortness of breath and substernal chest pressure. She has noticed significant dyspnea with activity and occasional swelling of both ankles. She has not had resting shortness of breath, PND, orthopnea, or lower extremity edema. She has had substernal chest pressure that typically associated with exertion but also can occur after meals. Symptoms usually relieved by rest. She has had increasing palpitations without dizzy spells or syncope. Follow-up transthoracic echocardiogram performed 02/12/2017 revealed normal left ventricular systolic function with mitral valve prolapse and at least moderate or severe mitral regurgitation. The patient subsequently underwent transesophageal echocardiogram on 03/04/2017. This confirmed the presence of normal left ventricular systolic function with bileaflet prolapse of the mitral valve. There were multiple regurgitant jets with one particularly severe jet of regurgitation coursing posteriorly around the left atrium. There was severe left atrial enlargement. Left and right heart catheterization was performed 03/28/2017. This revealed multivessel coronary artery disease with 100% chronic occlusion of the mid right coronary artery and left to right collateral filling of the terminal branches of the right coronary artery. There was diffuse nonobstructive disease in the left coronary circulation. Pulmonary artery pressures were normal. The patient  was referred for surgical consultation.  The patient is married and lives in [place] with her husband. She has been retired for approximately 15 years having previously worked at [employer] for [employer] at [employer]. She has a total of 3 children one of whom is living, 5 grandchildren and 1 great grandchild. She has remained reasonably active physically all of her adult life. She was limited to some degree by severe scoliosis but she underwent a combination of 2 lumbar fusion procedures by Dr. Ellene Route and has been doing well since then. She complains of a 2-3 month history of exertional shortness of breath and chest pressure as noted previously. She has not had any prolonged episodes of substernal chest discomfort or shortness of breath. Appetite is good. Weight is stable. She was recently started on Xarelto by Dr. Harrington Challenger. There is no documented history of atrial fibrillation or atrial flutter.  Patient plans to proceed with elective mitral valve repair and coronary artery bypass grafting in the operating room tomorrow. She was originally seen in consultation on 04/05/2017. Since then she reports no new problems or complaints. She stops taking Xarelto last week in anticipation of surgery and began taking amiodarone which was ordered prophylactically. She did not tolerate amiodarone due to nausea.   Brief Hospital Course:  The patient was extubated the evening of surgery without difficulty. She remained afebrile and hemodynamically stable. She was weaned off Dopamine drip but was continue on Milrinone post op. Co ox were monitored daily. Gordy Councilman, a line, chest tubes, and foley were removed early in the post operative course. She was not put on a BB. She was started on Couamdin and her PT and INR were monitored daily. Initially, she had quick increase in her INR secondary to elevated transaminases. Over time, her transaminases decreased. She has been given several doses of 2 mg of Coumadin. She has been increased to 2.5 mg because her INR was trending down. Her latest INR is  1.38 but will not see result of 2.5 mg of Coumadin until 05/09/2017. She had acute combined biventricular  heart failure. She was diuresed and this was managed by heart failure. She had ABL anemia. She did not require a post op transfusion. Last H and H was 10.4 and 32.2.  She went into a fib and was put on an Amiodarone drip. She remained in a fib with a controlled ventricular rate.  She was put on oral Amiodarone. She was weaned off the insulin drip.  She was on Metformin pre op and this was resumed at discharge.  The patient's glucose remained well controlled.The patient's HGA1C pre op was 6.3. The patient was felt surgically stable for transfer from the ICU to [facility] for further convalescence on 05/01/2017. She continues to progress with cardiac rehab. She was requiring 2 liters of oxygen via [place]. We will attempt to wean her to room air. Heart failure continued t monitor her Milrinone drip and diuretics. Milrinone drip was stopped on 05/05/2017. Co ox 08/27 was stable at 65.4. Her co ox did decrease to 44 but at last check was up to 53.5. Her CVP was around 7. She converted to sinus rhythm. She has been tolerating a diet and has had a bowel movement. Epicardial pacing wires and chest tube sutures have previously been removed in the ICU.  Her magnesium was found to be low (1.2) on 08/29. She was supplement with IV magnesium and then put on oral Magnesium at discharge . As discussed with  heart failure, the patient is felt surgically stable for discharge today.   Latest Vital Signs: Blood pressure 125/77, pulse 83, temperature 98.5 F (36.9 C), temperature source Oral, resp. rate 17, height 5' 6.5" (1.689 m), weight 123 lb 4.8 oz (55.9 kg), SpO2 100 %.  Physical Exam: Heart: IRRR IRRR Lungs: Mostly clear Abdomen: Soft, non tender, bowel sounds present Extremities: Trace bilateral LE edema Wound: Clean and dry  Discharge Condition:Stable and discharged to home.  Recent laboratory studies:  Lab Results  Component Value Date   WBC 8.7 05/06/2017   HGB 10.4 (L) 05/06/2017   HCT 32.2 (L) 05/06/2017   MCV  92.5 05/06/2017   PLT 328 05/06/2017   Lab Results  Component Value Date   NA 130 (L) 05/08/2017   K 4.1 05/08/2017   CL 92 (L) 05/08/2017   CO2 27 05/08/2017   CREATININE 0.76 05/08/2017   GLUCOSE 127 (H) 05/08/2017   Diagnostic Studies: Dg Chest 2 View  Result Date: 05/02/2017 CLINICAL DATA:  CHF EXAM: CHEST  2 VIEW COMPARISON:  05/01/2017 FINDINGS: Right PICC line remains in place, unchanged. Changes of prior valve replacement. Left lower lobe atelectasis or infiltrate and areas of right perihilar atelectasis again noted, stable. Mild elevation of the right hemidiaphragm. No visible effusions or pneumothorax. IMPRESSION: Left lower lobe atelectasis or infiltrate and right perihilar atelectasis. No significant change. Electronically Signed   By: Rolm Baptise M.D.   On: 05/02/2017 07:50   Ct Angio Chest Aorta W &/or Wo Contrast  Result Date: 04/15/2017 CLINICAL DATA:  83 year old female undergoing preoperative evaluation prior to mitral valve replacement and CABG on April 23, 2017 EXAM: CT ANGIOGRAPHY CHEST, ABDOMEN AND PELVIS TECHNIQUE: Multidetector CT imaging through the chest, abdomen and pelvis was performed using the standard protocol during bolus administration of intravenous contrast. Multiplanar reconstructed images and MIPs were obtained and reviewed to evaluate the vascular anatomy. CONTRAST:  75 mL Isovue 370 COMPARISON:  Prior CT scan of the lumbar spine 05/25/2014 FINDINGS: CTA CHEST FINDINGS Cardiovascular: Conventional 3 vessel aortic arch. Mild ectasia of the ascending thoracic aorta to 3.5 cm. The aortic root is normal in caliber. The arch and descending thoracic aorta are also normal in caliber. There is mild atherosclerotic vascular calcification. Trace calcifications are also present at the left main, left anterior descending, circumflex and right coronary arteries. Marked left atrial dilatation. Thickened mitral valve. The remainder the cardiac structures are normal in size.  No pericardial effusion. Mediastinum/Nodes: Unremarkable CT appearance of the thyroid gland. No suspicious mediastinal or hilar adenopathy. No soft tissue mediastinal mass. Moderately large sliding hiatal hernia. Lungs/Pleura: Lungs are clear. No pleural effusion or pneumothorax. Musculoskeletal: No acute osseous abnormality. Long segment posterior thoracic fusion. Review of the MIP images confirms the above findings. CTA ABDOMEN AND PELVIS FINDINGS VASCULAR Aorta: Atherosclerotic vascular calcifications without evidence of aneurysm or dissection. Celiac: Widely patent.  No aneurysm or dissection. SMA: Widely patent.  No aneurysm or dissection. Renals: Single right renal artery. Mild atherosclerotic plaque at the origin with perhaps mild stenosis. Dominant left renal artery is also widely patent. There is a small accessory artery to the right lower pole. No changes of fibromuscular dysplasia. IMA: Patent with normal antegrade flow. Inflow: Minimal heterogeneous atherosclerotic plaque in the proximal common iliac arteries. No significant stenosis. The internal iliac arteries are patent. The external iliac arteries are spared from any disease. Veins: No focal venous abnormality. Review of the MIP images confirms the above findings. NON-VASCULAR Hepatobiliary: Low-attenuation lesion with  peripheral incomplete nodular enhancement in the hepatic dome measures 4.3 x 3.3 cm and is consistent with a benign hemangioma. There is a second similar-appearing lesion in the posterior aspect of hepatic segment 7 which measures up to 5 cm in diameter. A third smaller lesion is also present in hepatic segment 7 and measures only 1.4 cm. Another similar lesion in the left hepatic lobe measures up to 1.4 cm. Finally, there is a circumscribed nonenhancing cystic lesion in hepatic segment 3 which measures 2.4 cm in diameter. No evidence of biliary ductal dilatation. Cholelithiasis. Pancreas: Unremarkable. No pancreatic ductal dilatation  or surrounding inflammatory changes. Spleen: Normal in size without focal abnormality. Adrenals/Urinary Tract: Normal adrenal glands. No evidence of hydronephrosis or enhancing renal mass. Nonobstructing 3- 4 mm stone in the lower pole of the left kidney. Adjacent punctate 1 mm stone also in the lower pole of the left kidney. The ureters are unremarkable. Stomach/Bowel: No evidence of bowel obstruction or focal bowel wall thickening. Colonic diverticular disease without CT evidence of active inflammation. Lymphatic: No suspicious lymphadenopathy. Reproductive: Status post hysterectomy. No adnexal masses. Other: Small fat containing umbilical hernia. No abdominopelvic ascites. Musculoskeletal: No acute osseous abnormality. Extensive multi level thoraco lumbar fusion extending from T7 through S1. Review of the MIP images confirms the above findings. IMPRESSION: CTA CHEST 1. No evidence of acute aortic abnormality. 2. Mild ectasia of the ascending thoracic aorta with a maximal diameter of 3.5 cm. 3. Coronary and aortic atherosclerotic calcifications. Aortic Atherosclerosis (ICD10-170.0) 4. Thickened mitral valve with prominent dilatation of the left atrium consistent with the clinical history of mitral valve disease. 5. Moderately large sliding hiatal hernia. CTA ABD/PELVIS 1. No evidence of abdominal aortic aneurysm or acute aortic abnormality. 2. Scattered atherosclerotic vascular calcifications. 3. Multiple hepatic lesions the appearance of which are most consistent with a combination of a combination of benign hemangiomas and a simple cyst. 4. Cholelithiasis. 5. Colonic diverticular disease without CT evidence of active inflammation. 6. Nonobstructing left lower pole nephrolithiasis. 7. Small fat containing umbilical hernia. 8. Extensive multilevel thoracolumbar posterior fusion extending from T7 through S1. Signed, Criselda Peaches, MD [facility]  Electronically Signed   By: Jacqulynn Cadet M.D.   On: 04/15/2017 11:27   Ct Angio Abd/pel W/ And/or W/o  Result Date: 04/15/2017 CLINICAL DATA:  83 year old female undergoing preoperative evaluation prior to mitral valve replacement and CABG on April 23, 2017 EXAM: CT ANGIOGRAPHY CHEST, ABDOMEN AND PELVIS TECHNIQUE: Multidetector CT imaging through the chest, abdomen and pelvis was performed using the standard protocol during bolus administration of intravenous contrast. Multiplanar reconstructed images and MIPs were obtained and reviewed to evaluate the vascular anatomy. CONTRAST:  75 mL Isovue 370 COMPARISON:  Prior CT scan of the lumbar spine 05/25/2014 FINDINGS: CTA CHEST FINDINGS Cardiovascular: Conventional 3 vessel aortic arch. Mild ectasia of the ascending thoracic aorta to 3.5 cm. The aortic root is normal in caliber. The arch and descending thoracic aorta are also normal in caliber. There is mild atherosclerotic vascular calcification. Trace calcifications are also present at the left main, left anterior descending, circumflex and right coronary arteries. Marked left atrial dilatation. Thickened mitral valve. The remainder the cardiac structures are normal in size. No pericardial effusion. Mediastinum/Nodes: Unremarkable CT appearance of the thyroid gland. No suspicious mediastinal or hilar adenopathy. No soft tissue mediastinal mass. Moderately large sliding hiatal hernia. Lungs/Pleura: Lungs are clear. No pleural effusion or pneumothorax. Musculoskeletal: No acute osseous abnormality. Long segment posterior thoracic fusion.  Review of the MIP images confirms the above findings. CTA ABDOMEN AND PELVIS FINDINGS VASCULAR Aorta: Atherosclerotic vascular calcifications without evidence of aneurysm or dissection. Celiac: Widely patent.  No aneurysm or dissection. SMA: Widely patent.  No aneurysm or dissection. Renals: Single right renal artery. Mild atherosclerotic plaque at the origin with perhaps mild  stenosis. Dominant left renal artery is also widely patent. There is a small accessory artery to the right lower pole. No changes of fibromuscular dysplasia. IMA: Patent with normal antegrade flow. Inflow: Minimal heterogeneous atherosclerotic plaque in the proximal common iliac arteries. No significant stenosis. The internal iliac arteries are patent. The external iliac arteries are spared from any disease. Veins: No focal venous abnormality. Review of the MIP images confirms the above findings. NON-VASCULAR Hepatobiliary: Low-attenuation lesion with peripheral incomplete nodular enhancement in the hepatic dome measures 4.3 x 3.3 cm and is consistent with a benign hemangioma. There is a second similar-appearing lesion in the posterior aspect of hepatic segment 7 which measures up to 5 cm in diameter. A third smaller lesion is also present in hepatic segment 7 and measures only 1.4 cm. Another similar lesion in the left hepatic lobe measures up to 1.4 cm. Finally, there is a circumscribed nonenhancing cystic lesion in hepatic segment 3 which measures 2.4 cm in diameter. No evidence of biliary ductal dilatation. Cholelithiasis. Pancreas: Unremarkable. No pancreatic ductal dilatation or surrounding inflammatory changes. Spleen: Normal in size without focal abnormality. Adrenals/Urinary Tract: Normal adrenal glands. No evidence of hydronephrosis or enhancing renal mass. Nonobstructing 3- 4 mm stone in the lower pole of the left kidney. Adjacent punctate 1 mm stone also in the lower pole of the left kidney. The ureters are unremarkable. Stomach/Bowel: No evidence of bowel obstruction or focal bowel wall thickening. Colonic diverticular disease without CT evidence of active inflammation. Lymphatic: No suspicious lymphadenopathy. Reproductive: Status post hysterectomy. No adnexal masses. Other: Small fat containing umbilical hernia. No abdominopelvic ascites. Musculoskeletal: No acute osseous abnormality. Extensive multi  level thoraco lumbar fusion extending from T7 through S1. Review of the MIP images confirms the above findings. IMPRESSION: CTA CHEST 1. No evidence of acute aortic abnormality. 2. Mild ectasia of the ascending thoracic aorta with a maximal diameter of 3.5 cm. 3. Coronary and aortic atherosclerotic calcifications. Aortic Atherosclerosis (ICD10-170.0) 4. Thickened mitral valve with prominent dilatation of the left atrium consistent with the clinical history of mitral valve disease. 5. Moderately large sliding hiatal hernia. CTA ABD/PELVIS 1. No evidence of abdominal aortic aneurysm or acute aortic abnormality. 2. Scattered atherosclerotic vascular calcifications. 3. Multiple hepatic lesions the appearance of which are most consistent with a combination of a combination of benign hemangiomas and a simple cyst. 4. Cholelithiasis. 5. Colonic diverticular disease without CT evidence of active inflammation. 6. Nonobstructing left lower pole nephrolithiasis. 7. Small fat containing umbilical hernia. 8. Extensive multilevel thoracolumbar posterior fusion extending from T7 through S1. Signed, Criselda Peaches, MD [facility] Radiology Electronically Signed   By: Jacqulynn Cadet M.D.   On: 04/15/2017 11:27       Discharge Instructions    Amb Referral to Cardiac Rehabilitation    Complete by:  As directed    Diagnosis:   Valve Replacement CABG     Valve:  Mitral   CABG X ___:  1      Discharge Medications: Allergies as of 05/08/2017      Reactions   Cortisone Other (See Comments)   Insomnia, heart palpitations (po only)  Statins Other (See Comments)   Myalgias   Codeine Swelling   FACIAL SWELLING SEVERITY UNKNOWN   Sulfonamide Derivatives    UNSPECIFIED REACTION    Lactose Intolerance (gi) Nausea And Vomiting      Medication List    STOP taking these medications   ciprofloxacin 500 MG tablet Commonly known as:  CIPRO   diltiazem 240 MG  24 hr capsule Commonly known as:  DILACOR XR   lisinopril-hydrochlorothiazide 10-12.5 MG tablet Commonly known as:  PRINZIDE,ZESTORETIC   rivaroxaban 20 MG Tabs tablet Commonly known as:  XARELTO     TAKE these medications   amiodarone 200 MG tablet Commonly known as:  PACERONE Take 1 tablet (200 mg total) by mouth 2 (two) times daily. For one week then take 200 mg by mouth daily thereafter.   aspirin 81 MG EC tablet Take 1 tablet (81 mg total) by mouth daily.   bisacodyl 5 MG EC tablet Commonly known as:  DULCOLAX Take 10 mg by mouth daily as needed (for constipation.).   citalopram 20 MG tablet Commonly known as:  CELEXA Take 1 tablet (20 mg total) by mouth daily.   clonazePAM 0.5 MG tablet Commonly known as:  KLONOPIN Take one tablet by mouth PRN insomnia or anxiety. What changed:  medication strength  additional instructions   conjugated estrogens vaginal cream Commonly known as:  PREMARIN Place 1 Applicatorful vaginally daily. What changed:  when to take this   esomeprazole 20 MG capsule Commonly known as:  NEXIUM Take 20 mg by mouth daily at 12 noon.   famotidine 20 MG tablet Commonly known as:  PEPCID Take 20 mg by mouth daily as needed for heartburn or indigestion.   fexofenadine 180 MG tablet Commonly known as:  ALLEGRA Take 90 mg by mouth daily as needed for allergies or rhinitis (allergy headache).   fluticasone 50 MCG/ACT nasal spray Commonly known as:  FLONASE SHAKE LIQUID AND USE 2 SPRAYS IN EACH NOSTRIL DAILY AS NEEDED FOR ALLERGIES   furosemide 20 MG tablet Commonly known as:  LASIX Take 1 tablet (20 mg total) by mouth daily.   HYDROcodone-acetaminophen 5-325 MG tablet Commonly known as:  NORCO/VICODIN Take 0.5 tablets by mouth every 6 (six) hours as needed for severe pain.   ibuprofen 200 MG tablet Commonly known as:  ADVIL,MOTRIN Take 400 mg by mouth daily as needed for headache or mild pain.   ICAPS AREDS 2 Caps Take 1 capsule by  mouth 2 (two) times daily.   magnesium oxide 400 (241.3 Mg) MG tablet Commonly known as:  MAG-OX Take 1 tablet (400 mg total) by mouth 2 (two) times daily.   metFORMIN 500 MG tablet Commonly known as:  GLUCOPHAGE Take 2 tablets (1,000 mg total) by mouth 2 (two) times daily with a meal.   ONETOUCH DELICA LANCETS 32K Misc USE TO TEST TWICE DAILY AS DIRECTED   ONETOUCH VERIO test strip Generic drug:  glucose blood USE TO CHECK BLOOD GLUCOSE TWICE DAILY AS DIRECTED   PROBIOTIC DAILY PO Take 2 capsules by mouth daily.   sacubitril-valsartan 24-26 MG Commonly known as:  ENTRESTO Take 1 tablet by mouth 2 (two) times daily.   spironolactone 25 MG tablet Commonly known as:  ALDACTONE Take 1 tablet (25 mg total) by mouth at bedtime.   trimethoprim 100 MG tablet Commonly known as:  TRIMPEX TAKE 1 TABLET(100 MG) BY MOUTH DAILY What changed:  See the new instructions.   warfarin 2.5 MG tablet Commonly known as:  COUMADIN  Take 1 tablet (2.5 mg total) by mouth daily. Or as directed.            Durable Medical Equipment        Start     Ordered   05/08/17 1245  For home use only DME 3 n 1  Once     05/08/17 1245       Discharge Care Instructions        Start     Ordered   05/08/17 0000  magnesium oxide (MAG-OX) 400 (241.3 Mg) MG tablet  2 times daily    Question:  Supervising Provider  Answer:  [name]   05/08/17 1244   05/07/17 0000  amiodarone (PACERONE) 200 MG tablet  2 times daily    Question:  Supervising Provider  Answer:  [name]   05/07/17 0818   05/07/17 0000  aspirin EC 81 MG EC tablet  Daily     05/07/17 0818   05/07/17 0000  sacubitril-valsartan (ENTRESTO) 24-26 MG  2 times daily    Question:  Supervising Provider  Answer:  [name]   05/07/17 0818   05/07/17 0000  spironolactone (ALDACTONE) 25 MG tablet  Daily at bedtime    Question:  Supervising Provider  Answer:  [name]   05/07/17 0818   05/07/17 0000   HYDROcodone-acetaminophen (NORCO/VICODIN) 5-325 MG tablet  Every 6 hours PRN    Question:  Supervising Provider  Answer:  [name]   05/07/17 0818   05/07/17 0000  clonazePAM (KLONOPIN) 0.5 MG tablet    Question:  Supervising Provider  Answer:  [name]   05/07/17 0818   05/07/17 0000  warfarin (COUMADIN) 2.5 MG tablet  Daily    Question:  Supervising Provider  Answer:  [name]   05/07/17 0818   05/07/17 0000  furosemide (LASIX) 20 MG tablet  Daily    Question:  Supervising Provider  Answer:  [name]   05/07/17 0915   05/07/17 0000  Amb Referral to Cardiac Rehabilitation    Question Answer Comment  Diagnosis: Valve Replacement   Diagnosis: CABG   Valve: Mitral   CABG X ___ 1      05/07/17 1050     The patient has been discharged on:   1.Beta Blocker:  Yes [   ]                              No   [   ]                              If No, reason:  2.Ace Inhibitor/ARB: Yes [  x ]                                     No  [    ]                                     If No, reason:  3.Statin:   Yes [   ]                  No  [ x  ]  If No, reason:Elevated transaminases-will try to start after discharge  4.Shela Commons:  Yes  [  x ]                  No   [   ]                  If No, reason:  Follow Up Appointments: Follow-up Information    [name], MD Follow up on 06/10/2017.   Specialty:  Cardiothoracic Surgery Why:  PA/LAT CXR to be taken (at [facility] which is in the same building as Dr. Guy Sandifer office, on the ground floor) on 06/10/2017 at 11:30 am;Appointment time is at 12:00 pm  Contact information: [place] [address] [phone]        Richardson Dopp T, PA-C Follow up on 05/28/2017.   Specialties:  Cardiology, Physician Assistant Why:  Appointment time is at 10:15 am Contact information: [address] [place] [address] [phone]         Eulas Post, MD. Call.   Specialty:  Family Medicine Why:  Call for a follow up appointment regarding further diabetes management Contact information: [place] [address] [phone]        Bensimhon, Shaune Pascal, MD Follow up on 05/15/2017.   Specialty:  Cardiology Why:  at 2:30 [place]. Entrance C Contact information: [address] [place] [address] [phone]        [facility] Follow up on 05/10/2017.   Specialty:  Cardiology Why:  Appointment time is at 10:30 am and is to have PT and INR drawn (on Coumadin for mitral valve replacement, a fib). INR goal 2-2.5 Contact information: [address], [place] [phone]          Signed: Lars Pinks MPA-C 05/08/2017, 12:54 PM

## 2017-05-03 NOTE — Progress Notes (Signed)
CARDIAC REHAB PHASE I   PRE:  Rate/Rhythm: 94 afib    BP: sitting 105/54    SaO2: 93 RA  MODE:  Ambulation: 130 ft   POST:  Rate/Rhythm: 109 afib with PVCs    BP: sitting 104/67     SaO2: 94-98 RA  Pt in bed, c/o significant neck pain. Pt bathroom with incontinence. Used RW, slow pace. Slightly unsteady due to weakness. Declined walking any farther. Return to bed. VSS with walking, just fatigues. She has not been practicing IS. Had her practice x5, with 500 mL. [phone]  [place], ACSM 05/03/2017 2:24 PM

## 2017-05-04 LAB — BASIC METABOLIC PANEL
Anion gap: 7 (ref 5–15)
BUN: 10 mg/dL (ref 6–20)
CALCIUM: 7.8 mg/dL — AB (ref 8.9–10.3)
CO2: 28 mmol/L (ref 22–32)
CREATININE: 0.67 mg/dL (ref 0.44–1.00)
Chloride: 94 mmol/L — ABNORMAL LOW (ref 101–111)
GFR calc Af Amer: >60 mL/min (ref >60)
GLUCOSE: 139 mg/dL — AB (ref 65–99)
Potassium: 3.8 mmol/L (ref 3.5–5.1)
Sodium: 129 mmol/L — ABNORMAL LOW (ref 135–145)

## 2017-05-04 LAB — CBC
HCT: 30.2 % — ABNORMAL LOW (ref 36.0–46.0)
Hemoglobin: 9.7 g/dL — ABNORMAL LOW (ref 12.0–15.0)
MCH: 29.3 pg (ref 26.0–34.0)
MCHC: 32.1 g/dL (ref 30.0–36.0)
MCV: 91.2 fL (ref 78.0–100.0)
PLATELETS: 219 10*3/uL (ref 150–400)
RBC: 3.31 MIL/uL — ABNORMAL LOW (ref 3.87–5.11)
RDW: 15.7 % — AB (ref 11.5–15.5)
WBC: 8.9 10*3/uL (ref 4.0–10.5)

## 2017-05-04 LAB — GLUCOSE, CAPILLARY
Glucose-Capillary: 147 mg/dL — ABNORMAL HIGH (ref 65–99)
Glucose-Capillary: 143 mg/dL — ABNORMAL HIGH (ref 65–99)

## 2017-05-04 LAB — COOXEMETRY PANEL
Carboxyhemoglobin: 1.3 % (ref 0.5–1.5)
Methemoglobin: 1.2 % (ref 0.0–1.5)
O2 SAT: 60.5 %
TOTAL HEMOGLOBIN: 10.1 g/dL — AB (ref 12.0–16.0)

## 2017-05-04 LAB — PROTIME-INR
INR: 1.77
PROTHROMBIN TIME: 20.8 s — AB (ref 11.4–15.2)

## 2017-05-04 MED ORDER — MILRINONE LACTATE IN DEXTROSE 20-5 MG/100ML-% IV SOLN
0.1250 ug/kg/min | INTRAVENOUS | Status: DC
  Filled 2017-05-04: qty 100

## 2017-05-04 MED ORDER — METFORMIN HCL 500 MG PO TABS
500.0000 mg | ORAL_TABLET | Freq: Two times a day (BID) | ORAL | Status: DC
  Administered 2017-05-04 – 2017-05-08 (×9): 500 mg via ORAL
  Filled 2017-05-04 (×9): qty 1

## 2017-05-04 MED ORDER — WARFARIN SODIUM 2 MG PO TABS
2.0000 mg | ORAL_TABLET | Freq: Once | ORAL | Status: AC
  Administered 2017-05-04: 2 mg via ORAL
  Filled 2017-05-04: qty 1

## 2017-05-04 NOTE — Progress Notes (Addendum)
[address]       [place] [address]             [phone]      11 Days Post-Op Procedure(s) (LRB): MITRAL VALVE (MV) REPLACEMENT (N/A) CORONARY ARTERY BYPASS GRAFTING (CABG), ON PUMP, TIMES ONE, USING ENDOSCOPICALLY HARVESTED RIGHT GREATER SAPHENOUS VEIN (N/A) TRANSESOPHAGEAL ECHOCARDIOGRAM (TEE) (N/A) CLIPPING OF LEFT ATRIAL APPENDAGE Subjective: conts to feel better, no specific c/o  Objective: Vital signs in last 24 hours: Temp:  [98.2 F (36.8 C)-98.7 F (37.1 C)] 98.5 F (36.9 C) (08/25 0437) Pulse Rate:  [83-87] 87 (08/25 0028) Cardiac Rhythm: Atrial fibrillation (08/24 2000) Resp:  [12-30] 22 (08/25 0437) BP: (84-124)/(53-77) 124/77 (08/25 0437) SpO2:  [92 %-96 %] 92 % (08/25 0437) Weight:  [127 lb 13.9 oz (58 kg)] 127 lb 13.9 oz (58 kg) (08/25 0437)  Hemodynamic parameters for last 24 hours: CVP:  [8 mmHg-9 mmHg] 9 mmHg  Intake/Output from previous day: 08/24 0701 - 08/25 0700 In: 139 [I.V.:139] Out: 1300 [Urine:1300] Intake/Output this shift: No intake/output data recorded.  General appearance: alert, cooperative and no distress Heart: irregularly irregular rhythm Lungs: mildly dim in left base Abdomen: benign Extremities: no edema Wound: incis healing well  Lab Results:  Recent Labs  05/02/17 0359 05/04/17 0351  WBC 7.7 8.9  HGB 9.8* 9.7*  HCT 29.6* 30.2*  PLT 132* 219   BMET:  Recent Labs  05/03/17 0400 05/04/17 0351  NA 131* 129*  K 3.7 3.8  CL 93* 94*  CO2 25 28  GLUCOSE 118* 139*  BUN 10 10  CREATININE 0.73 0.67  CALCIUM 8.1* 7.8*    PT/INR:  Recent Labs  05/04/17 0351  LABPROT 20.8*  INR 1.77   ABG    Component Value Date/Time   PHART 7.335 (L) 04/24/2017 0444   HCO3 20.4 04/24/2017 0444   TCO2 23 04/24/2017 1736   ACIDBASEDEF 5.0 (H) 04/24/2017 0444   O2SAT 60.5 05/04/2017 0405   CBG (last 3)   Recent Labs  05/03/17 1636 05/03/17 2105 05/04/17 0610  GLUCAP 99 142* 147*     Meds Scheduled Meds: . amiodarone  400 mg Oral BID  . aspirin EC  81 mg Oral Daily  . bisacodyl  10 mg Oral Daily   Or  . bisacodyl  10 mg Rectal Daily  . Chlorhexidine Gluconate Cloth  6 each Topical Q0600  . citalopram  20 mg Oral Daily  . docusate sodium  200 mg Oral Daily  . furosemide  40 mg Oral Daily  . insulin aspart  0-9 Units Subcutaneous TID WC  . mouth rinse  15 mL Mouth Rinse BID  . pantoprazole  40 mg Oral Daily  . sacubitril-valsartan  1 tablet Oral BID  . sodium chloride flush  3 mL Intravenous Q12H  . spironolactone  25 mg Oral QHS  . Warfarin - Physician Dosing Inpatient   Does not apply q1800   Continuous Infusions: . sodium chloride    . lactated ringers Stopped (04/24/17 0800)  . milrinone 0.2 mcg/kg/min (05/04/17 0400)  . potassium chloride Stopped (04/30/17 2100)   PRN Meds:.sodium chloride, alum & mag hydroxide-simeth, clonazePAM, HYDROcodone-acetaminophen, ibuprofen, metoprolol tartrate, ondansetron (ZOFRAN) IV, potassium chloride, sodium chloride flush, traMADol  Xrays No results found.  Assessment/Plan: S/P Procedure(s) (LRB): MITRAL VALVE (MV) REPLACEMENT (N/A) CORONARY ARTERY BYPASS GRAFTING (CABG), ON PUMP, TIMES ONE, USING ENDOSCOPICALLY HARVESTED RIGHT GREATER SAPHENOUS VEIN (N/A) TRANSESOPHAGEAL ECHOCARDIOGRAM (TEE) (N/A) CLIPPING OF LEFT ATRIAL  APPENDAGE   1 conts to make good overall progress 2 Afib with CVR conts current management. INR conts to rise on current dose- cont 2 mg of coumadin. Considering DCCV if unable to wean milrinone off  3 AHF team managing milrinone- cont to wean as per them- CO-OX is 60 today. Lasix reduced yesterday , does not appear to have volume overload - defer decreasing diuretics further to AHF . Monitor sodium as is decreasing  4 H/H is stable-improved, platelets now in normal range 5 restart metformin and d/c insulin- sugars fairly well controlled 6 off O2, cont pulm toilet/routine rehab modalities  LOS:  11 days    GOLD,WAYNE E 05/04/2017   I have seen and examined the patient and agree with the assessment and plan as outlined.  Making slow but steady progress.  Agree w/ plans per AHF team.  Slow wean milrinone, possible DCCV prior to hospital D/C.  Probably can decrease or stop diuretics - weight is now below preop baseline and patient does not appear volume overloaded.  [name], MD 05/04/2017 11:02 AM

## 2017-05-04 NOTE — Plan of Care (Signed)
Problem: Activity: Goal: Risk for activity intolerance will decrease Outcome: Progressing BRP slowly improving

## 2017-05-04 NOTE — Progress Notes (Addendum)
Advanced Heart Failure Rounding Note  PCP: Dr Elease Hashimoto Primary Cardiologist: Dr Harrington Challenger   Subjective:    Pt underwent CABG x 1 and MVR replacement 04/23/17. Milrinone added 04/27/17 with Mixed venous saturation of 33%.   Continues to improve. On milrinone 0.2. Co-ox 60.5%. CVP 4-5. Weight down another 5 pounds. Creatinine stable.   Walked 2x today. No dizziness.   Objective:   Weight Range: 58 kg (127 lb 13.9 oz) Body mass index is 20.33 kg/m.   Vital Signs:   Temp:  [98 F (36.7 C)-98.6 F (37 C)] 98 F (36.7 C) (08/25 0815) Pulse Rate:  [87-93] 93 (08/25 0815) Resp:  [20-30] 20 (08/25 0815) BP: (84-124)/(53-77) 91/65 (08/25 0815) SpO2:  [92 %-97 %] 97 % (08/25 0815) Weight:  [58 kg (127 lb 13.9 oz)] 58 kg (127 lb 13.9 oz) (08/25 0437) Last BM Date: 05/01/17  Weight change: Filed Weights   05/02/17 0822 05/03/17 0401 05/04/17 0437  Weight: 60.3 kg (132 lb 15 oz) 60 kg (132 lb 4.4 oz) 58 kg (127 lb 13.9 oz)    Intake/Output:   Intake/Output Summary (Last 24 hours) at 05/04/17 1238 Last data filed at 05/04/17 0400  Gross per 24 hour  Intake              139 ml  Output              900 ml  Net             -761 ml      Physical Exam  CVP 4-5  General:  Lying in bed . No resp difficulty HEENT: normal Neck: supple. no JVD. Carotids 2+ bilat; no bruits. No lymphadenopathy or thryomegaly appreciated. Cor: PMI nondisplaced. Irregular rate & rhythm. No rubs, gallops or murmurs. Lungs: clear Abdomen: soft, nontender, nondistended. No hepatosplenomegaly. No bruits or masses. Good bowel sounds. Extremities: no cyanosis, clubbing, rash, trace edema Neuro: alert & orientedx3, cranial nerves grossly intact. moves all 4 extremities w/o difficulty. Affect pleasant  Telemetry    Personally reviewed, AF 80-90s   EKG    N/A  Labs    CBC  Recent Labs  05/02/17 0359 05/04/17 0351  WBC 7.7 8.9  HGB 9.8* 9.7*  HCT 29.6* 30.2*  MCV 90.0 91.2  PLT 132* 128    Basic Metabolic Panel  Recent Labs  05/02/17 0359 05/03/17 0400 05/04/17 0351  NA 130* 131* 129*  K 2.9* 3.7 3.8  CL 93* 93* 94*  CO2 29 25 28   GLUCOSE 119* 118* 139*  BUN 9 10 10   CREATININE 0.68 0.73 0.67  CALCIUM 7.8* 8.1* 7.8*  MG 1.5* 1.9  --    Liver Function Tests  Recent Labs  05/02/17 0359  AST 69*  ALT 765*  ALKPHOS 79  BILITOT 1.1  PROT 5.3*  ALBUMIN 2.6*   No results for input(s): LIPASE, AMYLASE in the last 72 hours. Cardiac Enzymes No results for input(s): CKTOTAL, CKMB, CKMBINDEX, TROPONINI in the last 72 hours.  BNP: BNP (last 3 results) No results for input(s): BNP in the last 8760 hours.  ProBNP (last 3 results) No results for input(s): PROBNP in the last 8760 hours.   D-Dimer No results for input(s): DDIMER in the last 72 hours. Hemoglobin A1C No results for input(s): HGBA1C in the last 72 hours. Fasting Lipid Panel No results for input(s): CHOL, HDL, LDLCALC, TRIG, CHOLHDL, LDLDIRECT in the last 72 hours. Thyroid Function Tests No results for input(s): TSH, T4TOTAL, T3FREE,  THYROIDAB in the last 72 hours.  Invalid input(s): FREET3  Other results:   Imaging    No results found.   Medications:     Scheduled Medications: . amiodarone  400 mg Oral BID  . aspirin EC  81 mg Oral Daily  . bisacodyl  10 mg Oral Daily   Or  . bisacodyl  10 mg Rectal Daily  . Chlorhexidine Gluconate Cloth  6 each Topical Q0600  . citalopram  20 mg Oral Daily  . docusate sodium  200 mg Oral Daily  . furosemide  40 mg Oral Daily  . mouth rinse  15 mL Mouth Rinse BID  . metFORMIN  500 mg Oral BID WC  . pantoprazole  40 mg Oral Daily  . sacubitril-valsartan  1 tablet Oral BID  . sodium chloride flush  3 mL Intravenous Q12H  . spironolactone  25 mg Oral QHS  . warfarin  2 mg Oral ONCE-1800  . Warfarin - Physician Dosing Inpatient   Does not apply q1800    Infusions: . sodium chloride    . lactated ringers Stopped (04/24/17 0800)  .  milrinone 0.2 mcg/kg/min (05/04/17 0400)  . potassium chloride Stopped (04/30/17 2100)    PRN Medications: sodium chloride, alum & mag hydroxide-simeth, clonazePAM, HYDROcodone-acetaminophen, ibuprofen, metoprolol tartrate, ondansetron (ZOFRAN) IV, potassium chloride, sodium chloride flush, traMADol    Patient Profile   [name] is a 83 y.o. female with h/o MVP, HTN, DM2, scoliosis, and CAD. She had developed DOE over the past 2-3 months along with occasional peripheral edem   S/p MVR and CABG x 1 04/23/17. HF consulted post op with low mixed venous saturation and volume overload.   Assessment/Plan   1. Acute Combined Biventricular HF- EF dropped post op-->EF 25-30% with RV dysfunction.  - CO-OX 60.5% this am on milrinone 0.2 mcg/kg/min. Will decrease milrinone to 0.125 mcg/kg/min.  - CVP 4-5 today. Lasix decreased to 40 daily yesterday. Will decrease to 20 daily today   - Continue entresto 24-26 mg BID. Will not up-titrate with lightheadedness and relatively soft pressures.  - Creatinine stable. Hold off on carvedilol.  - Continue ted hose.     2. S/P CABG x1 MVR - bioprosthetic valve and LA clipping.  - Stable. 3. Shock Liver versus Passive Congestion - LFTs improved.  4  A fib RVR- Remains in A fib with controlled rated. Continue amio drip 30 mg per hour. Coumadin per Dr Roxy Manns . INR 1.77 -will do TEE/DCCV prior to d/c once INR >= 2.0. 5. Hypokalemia- K 3.8 this am. Continue to follow.  Supp ordered.  6. Hypomagnesium - Mg 1.9 yesterday. Given 4g .  7. Post op anemia-   - Hgb 9.7 stable No overt bleeding. Continue to follow.  8. Deconditioning - Working with PT and CR. Recommending HHPT.    Length of Stay: 11  Glori Bickers, MD  05/04/2017, 12:38 PM  Advanced Heart Failure Team Pager [phone] (M-F; 7a - 4p)  Please contact [facility] for night-coverage after hours (4p -7a ) and weekends on [email or web address]  Patient seen and examined with the above-signed Advanced  Practice Provider and/or Housestaff. I personally reviewed laboratory data, imaging studies and relevant notes. I independently examined the patient and formulated the important aspects of the plan. I have edited the note to reflect any of my changes or salient points. I have personally discussed the plan with the patient and/or family.  Remains on milrinone but co-ox improved today. Will wean to 0.2  mcg/kg/min. Volume status ok. I d/w Dr. Roxy Manns. Given presence of MVR would absolutely like to wean her off of milrinone completely in hospital and get PICC out prior to d/c but she deteriorated quickly last time it was stopped sop have to wean slowly. Will follow CVP and co-ox closely.   Agree that restoration of NSR might help but have to wait until INR therapeutic before we can arrange TEE and DC-CV. Continue amiodarone. Supp K. Continue PT.   Discussed at length with her and her daughter-in law at the bedside.   Glori Bickers, MD  12:38 PM

## 2017-05-04 NOTE — Progress Notes (Addendum)
CARDIAC REHAB PHASE I   PRE:  Rate/Rhythm:105 Afib with occ PVC's   BP:   Sitting: 115/70     SaO2: 97% RA  MODE:  Ambulation: 150 ft   POST:  Rate/Rhythm: 115 A fib with occasional PVC's  BP:   Sitting: 98/83     SaO2: 94% RA [phone] Assisted pt to restroom with incontinence. Changed gown, undergarments and padding. Pt ambulated 110ft with one person A,rolling walker, IV pole, and gait belt. Pt returned to bedside with VSS. Pt BP did drop encouraged hydration, pt asymptomatic. Practiced IS, able to reach 1000-1250ML, encouraged 10x in 1 hour. Gave pt ice pack for neck pain. Pt in bed with LE elevated, call bell in reach and family members nearby.  Lori Rossy D Sayyid Harewood,MS,ACSM-RCEP 05/04/2017 10:24 AM

## 2017-05-05 LAB — BASIC METABOLIC PANEL
Anion gap: 9 (ref 5–15)
BUN: 10 mg/dL (ref 6–20)
CALCIUM: 8.1 mg/dL — AB (ref 8.9–10.3)
CO2: 29 mmol/L (ref 22–32)
CREATININE: 0.75 mg/dL (ref 0.44–1.00)
Chloride: 94 mmol/L — ABNORMAL LOW (ref 101–111)
GLUCOSE: 124 mg/dL — AB (ref 65–99)
Potassium: 3.8 mmol/L (ref 3.5–5.1)
Sodium: 132 mmol/L — ABNORMAL LOW (ref 135–145)

## 2017-05-05 LAB — GLUCOSE, CAPILLARY
GLUCOSE-CAPILLARY: 133 mg/dL — AB (ref 65–99)
GLUCOSE-CAPILLARY: 94 mg/dL (ref 65–99)
GLUCOSE-CAPILLARY: 160 mg/dL — AB (ref 65–99)
Glucose-Capillary: 114 mg/dL — ABNORMAL HIGH (ref 65–99)

## 2017-05-05 LAB — PROTIME-INR
INR: 2.13
Prothrombin Time: 24.2 seconds — ABNORMAL HIGH (ref 11.4–15.2)

## 2017-05-05 LAB — COOXEMETRY PANEL
CARBOXYHEMOGLOBIN: 1.6 % — AB (ref 0.5–1.5)
METHEMOGLOBIN: 0.9 % (ref 0.0–1.5)
O2 SAT: 62.3 %
Total hemoglobin: 10 g/dL — ABNORMAL LOW (ref 12.0–16.0)

## 2017-05-05 MED ORDER — FUROSEMIDE 20 MG PO TABS
20.0000 mg | ORAL_TABLET | Freq: Every day | ORAL | Status: DC
  Administered 2017-05-06 – 2017-05-07 (×2): 20 mg via ORAL
  Filled 2017-05-05 (×2): qty 1

## 2017-05-05 MED ORDER — WARFARIN SODIUM 2 MG PO TABS
2.0000 mg | ORAL_TABLET | Freq: Once | ORAL | Status: AC
  Administered 2017-05-05: 2 mg via ORAL
  Filled 2017-05-05: qty 1

## 2017-05-05 MED ORDER — POTASSIUM CHLORIDE CRYS ER 20 MEQ PO TBCR
40.0000 meq | EXTENDED_RELEASE_TABLET | Freq: Once | ORAL | Status: AC
  Administered 2017-05-05: 40 meq via ORAL

## 2017-05-05 NOTE — Progress Notes (Addendum)
[address]       , [address]             [phone]      12 Days Post-Op Procedure(s) (LRB): MITRAL VALVE (MV) REPLACEMENT (N/A) CORONARY ARTERY BYPASS GRAFTING (CABG), ON PUMP, TIMES ONE, USING ENDOSCOPICALLY HARVESTED RIGHT GREATER SAPHENOUS VEIN (N/A) TRANSESOPHAGEAL ECHOCARDIOGRAM (TEE) (N/A) CLIPPING OF LEFT ATRIAL APPENDAGE Subjective: conts to feel stronger  Objective: Vital signs in last 24 hours: Temp:  [97.9 F (36.6 C)-98.8 F (37.1 C)] 98 F (36.7 C) (08/26 0445) Pulse Rate:  [81-89] 81 (08/26 0445) Cardiac Rhythm: Normal sinus rhythm (08/26 0007) Resp:  [16-22] 22 (08/26 0445) BP: (94-141)/(54-66) 141/61 (08/26 0445) SpO2:  [94 %-97 %] 94 % (08/26 0445) Weight:  [128 lb 8.5 oz (58.3 kg)] 128 lb 8.5 oz (58.3 kg) (08/26 0445)  Hemodynamic parameters for last 24 hours: CVP:  [7 mmHg-8 mmHg] 7 mmHg  Intake/Output from previous day: 08/25 0701 - 08/26 0700 In: 860.3 [P.O.:790; I.V.:70.3] Out: 1450 [Urine:1450] Intake/Output this shift: No intake/output data recorded.  General appearance: alert, cooperative and no distress Heart: regular rate and rhythm Lungs: di right base, some crackles in left base Abdomen: benign  Extremities: no edema Wound: incis healing well  Lab Results:  Recent Labs  05/04/17 0351  WBC 8.9  HGB 9.7*  HCT 30.2*  PLT 219   BMET:  Recent Labs  05/04/17 0351 05/05/17 0415  NA 129* 132*  K 3.8 3.8  CL 94* 94*  CO2 28 29  GLUCOSE 139* 124*  BUN 10 10  CREATININE 0.67 0.75  CALCIUM 7.8* 8.1*    PT/INR:  Recent Labs  05/05/17 0415  LABPROT 24.2*  INR 2.13   ABG    Component Value Date/Time   PHART 7.335 (L) 04/24/2017 0444   HCO3 20.4 04/24/2017 0444   TCO2 23 04/24/2017 1736   ACIDBASEDEF 5.0 (H) 04/24/2017 0444   O2SAT 62.3 05/05/2017 0410   CBG (last 3)   Recent Labs  05/04/17 0610 05/04/17 2127 05/05/17 0606  GLUCAP 147* 143* 114*    Meds Scheduled Meds: .  amiodarone  400 mg Oral BID  . aspirin EC  81 mg Oral Daily  . bisacodyl  10 mg Oral Daily   Or  . bisacodyl  10 mg Rectal Daily  . Chlorhexidine Gluconate Cloth  6 each Topical Q0600  . citalopram  20 mg Oral Daily  . docusate sodium  200 mg Oral Daily  . furosemide  40 mg Oral Daily  . mouth rinse  15 mL Mouth Rinse BID  . metFORMIN  500 mg Oral BID WC  . pantoprazole  40 mg Oral Daily  . sacubitril-valsartan  1 tablet Oral BID  . sodium chloride flush  3 mL Intravenous Q12H  . spironolactone  25 mg Oral QHS  . Warfarin - Physician Dosing Inpatient   Does not apply q1800   Continuous Infusions: . sodium chloride    . lactated ringers Stopped (04/24/17 0800)  . milrinone 0.125 mcg/kg/min (05/05/17 0500)  . potassium chloride Stopped (04/30/17 2100)   PRN Meds:.sodium chloride, alum & mag hydroxide-simeth, clonazePAM, HYDROcodone-acetaminophen, ibuprofen, metoprolol tartrate, ondansetron (ZOFRAN) IV, potassium chloride, sodium chloride flush, traMADol  Xrays No results found.  Assessment/Plan: S/P Procedure(s) (LRB): MITRAL VALVE (MV) REPLACEMENT (N/A) CORONARY ARTERY BYPASS GRAFTING (CABG), ON PUMP, TIMES ONE, USING ENDOSCOPICALLY HARVESTED RIGHT GREATER SAPHENOUS VEIN (N/A) TRANSESOPHAGEAL ECHOCARDIOGRAM (TEE) (N/A) CLIPPING OF LEFT ATRIAL APPENDAGE  1  conts to progress 2 Co-Ox 62 on .125 milrinone- AHF team is managing cardiac med adjustments 3 currently in sinus rhythm, INR 2.13, cont currrent coumadin dose 4 creat is stable 5 sugars fairly well controlled . A1c of 6.3 correlates with average BS of 143. Hesitant to increase metformin with HF issues. At 83, control is probably adequate 6 cont to push rehab/pulm toilet as able  LOS: 12 days    Mccarthy,Lori E 05/05/2017  I have seen and examined the patient and agree with the assessment and plan as outlined.  Back in sinus rhythm.  Looks and feels quite well.  Probably can try stopping milrinone.  Hopefully home w/in a  few days.  Plan per AHF team.    Lori Alberts, MD 05/05/2017 10:03 AM

## 2017-05-05 NOTE — Progress Notes (Signed)
Advanced Heart Failure Rounding Note  PCP: Dr Elease Hashimoto Primary Cardiologist: Dr Harrington Challenger   Subjective:    Pt underwent CABG x 1 and MVR replacement 04/23/17. Milrinone added 04/27/17 with Mixed venous saturation of 33%.   Breathing better. Co-ox 62% on milrinone 0.125. Volume status looks good.    Objective:   Weight Range: 58.3 kg (128 lb 8.5 oz) Body mass index is 20.43 kg/m.   Vital Signs:   Temp:  [97.9 F (36.6 C)-98.8 F (37.1 C)] 98.7 F (37.1 C) (08/26 1420) Pulse Rate:  [79-92] 91 (08/26 1420) Resp:  [16-22] 18 (08/26 1420) BP: (94-141)/(54-70) 110/70 (08/26 1420) SpO2:  [94 %-97 %] 96 % (08/26 1420) Weight:  [58.3 kg (128 lb 8.5 oz)] 58.3 kg (128 lb 8.5 oz) (08/26 0445) Last BM Date: 05/01/17  Weight change: Filed Weights   05/03/17 0401 05/04/17 0437 05/05/17 0445  Weight: 60 kg (132 lb 4.4 oz) 58 kg (127 lb 13.9 oz) 58.3 kg (128 lb 8.5 oz)    Intake/Output:   Intake/Output Summary (Last 24 hours) at 05/05/17 1507 Last data filed at 05/05/17 1421  Gross per 24 hour  Intake          1340.28 ml  Output             1650 ml  Net          -309.72 ml      Physical Exam   General:  Well appearing. No resp difficulty HEENT: normal Neck: supple. JVP 5-6. Carotids 2+ bilat; no bruits. No lymphadenopathy or thryomegaly appreciated. Cor: PMI nondisplaced. Regular rate & rhythm. Mechanical s1 Lungs: clear Abdomen: soft, nontender, nondistended. No hepatosplenomegaly. No bruits or masses. Good bowel sounds. Extremities: no cyanosis, clubbing, rash, trace edema Neuro: alert & orientedx3, cranial nerves grossly intact. moves all 4 extremities w/o difficulty. Affect pleasant   Telemetry    Personally reviewed, NSR 70-80s   EKG    N/A  Labs    CBC  Recent Labs  05/04/17 0351  WBC 8.9  HGB 9.7*  HCT 30.2*  MCV 91.2  PLT 678   Basic Metabolic Panel  Recent Labs  05/03/17 0400 05/04/17 0351 05/05/17 0415  NA 131* 129* 132*  K 3.7 3.8 3.8    CL 93* 94* 94*  CO2 25 28 29   GLUCOSE 118* 139* 124*  BUN 10 10 10   CREATININE 0.73 0.67 0.75  CALCIUM 8.1* 7.8* 8.1*  MG 1.9  --   --    Liver Function Tests No results for input(s): AST, ALT, ALKPHOS, BILITOT, PROT, ALBUMIN in the last 72 hours. No results for input(s): LIPASE, AMYLASE in the last 72 hours. Cardiac Enzymes No results for input(s): CKTOTAL, CKMB, CKMBINDEX, TROPONINI in the last 72 hours.  BNP: BNP (last 3 results) No results for input(s): BNP in the last 8760 hours.  ProBNP (last 3 results) No results for input(s): PROBNP in the last 8760 hours.   D-Dimer No results for input(s): DDIMER in the last 72 hours. Hemoglobin A1C No results for input(s): HGBA1C in the last 72 hours. Fasting Lipid Panel No results for input(s): CHOL, HDL, LDLCALC, TRIG, CHOLHDL, LDLDIRECT in the last 72 hours. Thyroid Function Tests No results for input(s): TSH, T4TOTAL, T3FREE, THYROIDAB in the last 72 hours.  Invalid input(s): FREET3  Other results:   Imaging    No results found.   Medications:     Scheduled Medications: . amiodarone  400 mg Oral BID  . aspirin  EC  81 mg Oral Daily  . bisacodyl  10 mg Oral Daily   Or  . bisacodyl  10 mg Rectal Daily  . Chlorhexidine Gluconate Cloth  6 each Topical Q0600  . citalopram  20 mg Oral Daily  . docusate sodium  200 mg Oral Daily  . furosemide  40 mg Oral Daily  . mouth rinse  15 mL Mouth Rinse BID  . metFORMIN  500 mg Oral BID WC  . pantoprazole  40 mg Oral Daily  . potassium chloride  40 mEq Oral Once  . sacubitril-valsartan  1 tablet Oral BID  . sodium chloride flush  3 mL Intravenous Q12H  . spironolactone  25 mg Oral QHS  . warfarin  2 mg Oral ONCE-1800  . Warfarin - Physician Dosing Inpatient   Does not apply q1800    Infusions: . sodium chloride    . lactated ringers Stopped (04/24/17 0800)  . milrinone 0.125 mcg/kg/min (05/05/17 0500)    PRN Medications: sodium chloride, alum & mag  hydroxide-simeth, clonazePAM, HYDROcodone-acetaminophen, ibuprofen, metoprolol tartrate, ondansetron (ZOFRAN) IV, sodium chloride flush, traMADol    Patient Profile   [name] is a 83 y.o. female with h/o MVP, HTN, DM2, scoliosis, and CAD. She had developed DOE over the past 2-3 months along with occasional peripheral edem   S/p MVR and CABG x 1 04/23/17. HF consulted post op with low mixed venous saturation and volume overload.   Assessment/Plan   1. Acute Combined Biventricular HF- EF dropped post op-->EF 25-30% with RV dysfunction.  - CO-OX 62% this am on milrinone 0.125 mcg/kg/min. Will stop milrinone. Continue to follow co-ox  - Volume status stable on lasix 20 daily. Can titrate as needed.  - Continue entresto 24-26 mg BID.  - Creatinine stable. Hold off on carvedilol.  - Continue ted hose.     2. S/P CABG x1 MVR - bioprosthetic valve and LA clipping.  - Stable. 3. Shock Liver versus Passive Congestion - LFTs improved.  4  A fib RVR- Remains in A fib with controlled rated. Continue amio drip 30 mg per hour. Coumadin per Dr Roxy Manns . INR 2.1 -back in NSR today. Continue amio.  5. Hypokalemia- K 3.8 this am. Continue to follow.  Supp ordered.  6. Hypomagnesium - Mg 1.9 yesterday. Given 4g .  7. Post op anemia-   - Hgb 10.0 stable No overt bleeding. Continue to follow.  8. Deconditioning - Working with PT and CR. Recommending HHPT.   Back in NSR. Can stop milrinone. Follow co-ox. Possibly home Tuesday,   Length of Stay: 12  Glori Bickers, MD  05/05/2017, 3:07 PM  Advanced Heart Failure Team Pager [phone] (M-F; 7a - 4p)  Please contact [facility] for night-coverage after hours (4p -7a ) and weekends on [email or web address]

## 2017-05-06 ENCOUNTER — Inpatient Hospital Stay (HOSPITAL_COMMUNITY): Payer: Medicare Other

## 2017-05-06 LAB — PROTIME-INR
INR: 1.96
Prothrombin Time: 22.6 seconds — ABNORMAL HIGH (ref 11.4–15.2)

## 2017-05-06 LAB — BASIC METABOLIC PANEL
ANION GAP: 10 (ref 5–15)
BUN: 9 mg/dL (ref 6–20)
CALCIUM: 8.3 mg/dL — AB (ref 8.9–10.3)
CO2: 28 mmol/L (ref 22–32)
CREATININE: 0.73 mg/dL (ref 0.44–1.00)
Chloride: 95 mmol/L — ABNORMAL LOW (ref 101–111)
GFR calc non Af Amer: >60 mL/min (ref >60)
Glucose, Bld: 125 mg/dL — ABNORMAL HIGH (ref 65–99)
Potassium: 4 mmol/L (ref 3.5–5.1)
SODIUM: 133 mmol/L — AB (ref 135–145)

## 2017-05-06 LAB — GLUCOSE, CAPILLARY
GLUCOSE-CAPILLARY: 125 mg/dL — AB (ref 65–99)
GLUCOSE-CAPILLARY: 86 mg/dL (ref 65–99)
GLUCOSE-CAPILLARY: 136 mg/dL — AB (ref 65–99)
Glucose-Capillary: 141 mg/dL — ABNORMAL HIGH (ref 65–99)

## 2017-05-06 LAB — COOXEMETRY PANEL
CARBOXYHEMOGLOBIN: 1.7 % — AB (ref 0.5–1.5)
Methemoglobin: 0.8 % (ref 0.0–1.5)
O2 SAT: 65.4 %
Total hemoglobin: 10.8 g/dL — ABNORMAL LOW (ref 12.0–16.0)

## 2017-05-06 LAB — CBC
HCT: 32.2 % — ABNORMAL LOW (ref 36.0–46.0)
HEMOGLOBIN: 10.4 g/dL — AB (ref 12.0–15.0)
MCH: 29.9 pg (ref 26.0–34.0)
MCHC: 32.3 g/dL (ref 30.0–36.0)
MCV: 92.5 fL (ref 78.0–100.0)
PLATELETS: 328 10*3/uL (ref 150–400)
RBC: 3.48 MIL/uL — AB (ref 3.87–5.11)
RDW: 15.4 % (ref 11.5–15.5)
WBC: 8.7 10*3/uL (ref 4.0–10.5)

## 2017-05-06 MED ORDER — WARFARIN SODIUM 2 MG PO TABS
2.0000 mg | ORAL_TABLET | Freq: Once | ORAL | Status: AC
  Administered 2017-05-06: 2 mg via ORAL
  Filled 2017-05-06: qty 1

## 2017-05-06 NOTE — Progress Notes (Signed)
Physical Therapy Treatment Patient Details Name: [name] MRN: [id number] DOB: 1934/08/06 Today's Date: 05/06/2017    History of Present Illness [name] is a 83 y.o. female with h/o MVP, HTN, DM2, scoliosis, and CAD. She had developed DOE over the past 2-3 months along with occasional peripheral edema. TEE 03/04/17 showed severe MR and severe LAE. L/RHC 03/28/17 with severe multivessel disease. Pt underwent CABG x 1 and MVR replacement 04/23/17.     PT Comments    Patient is progressing toward mobility goals. Pt able to recall sternal precautions however does need cues to adhere to them with transfers/transitional movements. Vitals WNL. Patient needs to practice stairs next session.      Follow Up Recommendations  Home health PT;Supervision/Assistance - 24 hour     Equipment Recommendations  3in1 (PT)    Recommendations for Other Services       Precautions / Restrictions Precautions Precautions: Fall;Sternal Precaution Comments: able to recall sternal precautions but continues to need cues to adhere to them    Mobility  Bed Mobility Overal bed mobility: Needs Assistance Bed Mobility: Rolling;Sidelying to Sit;Sit to Sidelying Rolling: Supervision Sidelying to sit: Min guard     Sit to sidelying: Supervision General bed mobility comments: cues for technique and maintaining precautions  Transfers Overall transfer level: Needs assistance Equipment used: Rolling walker (2 wheeled) Transfers: Sit to/from Stand Sit to Stand: Supervision         General transfer comment: cues for hand placement to maintain precautions  Ambulation/Gait Ambulation/Gait assistance: Supervision;Min guard Ambulation Distance (Feet): 200 Feet Assistive device: Rolling walker (2 wheeled) Gait Pattern/deviations: Step-through pattern;Decreased stride length;Trunk flexed Gait velocity: decreased   General Gait Details: min guard for safety with turning; cues for posture and decreased UE  weightbearing on RW; pt reported feeling winded and fatigued and declined ambulating farther; vitals WNL   Stairs Stairs:  (pt declined due to c/o fatigue)          Wheelchair Mobility    Modified Rankin (Stroke Patients Only)       Balance     Sitting balance-Leahy Scale: Good       Standing balance-Leahy Scale: Fair Standing balance comment: able to stand at sink without support                            Cognition Arousal/Alertness: Awake/alert Behavior During Therapy: WFL for tasks assessed/performed Overall Cognitive Status: Within Functional Limits for tasks assessed                                        Exercises      General Comments        Pertinent Vitals/Pain Pain Assessment: No/denies pain Pain Score: 0-No pain    Home Living                      Prior Function            PT Goals (current goals can now be found in the care plan section) Acute Rehab PT Goals Patient Stated Goal: To be able to cook again  Progress towards PT goals: Progressing toward goals    Frequency    Min 3X/week      PT Plan Current plan remains appropriate    Co-evaluation  AM-PAC PT "6 Clicks" Daily Activity  Outcome Measure  Difficulty turning over in bed (including adjusting bedclothes, sheets and blankets)?: A Lot Difficulty moving from lying on back to sitting on the side of the bed? : A Lot Difficulty sitting down on and standing up from a chair with arms (e.g., wheelchair, bedside commode, etc,.)?: A Lot Help needed moving to and from a bed to chair (including a wheelchair)?: A Little Help needed walking in hospital room?: A Little Help needed climbing 3-5 steps with a railing? : A Lot 6 Click Score: 14    End of Session Equipment Utilized During Treatment: Gait belt Activity Tolerance: Patient limited by fatigue Patient left: in bed;with call bell/phone within reach Nurse Communication:  Mobility status PT Visit Diagnosis: Unsteadiness on feet (R26.81);Muscle weakness (generalized) (M62.81)     Time: 3300-7622 PT Time Calculation (min) (ACUTE ONLY): 15 min  Charges:  $Gait Training: 8-22 mins                    G Codes:       Earney Navy, PTA Pager: [phone]     Darliss Cheney 05/06/2017, 1:53 PM

## 2017-05-06 NOTE — Progress Notes (Signed)
Pt refused to get oob explained importance still refused

## 2017-05-06 NOTE — Progress Notes (Signed)
Advanced Heart Failure Rounding Note  PCP: Dr Elease Hashimoto Primary Cardiologist: Dr Harrington Challenger   Subjective:    Pt underwent CABG x 1 and MVR replacement 04/23/17. Milrinone added 04/27/17 with Mixed venous saturation of 33%.   Feels ok. No SOB. Off milrinone co-ox 65%. Remains in NSR. Weight stable on low-dose lasix.    Objective:   Weight Range: 57.2 kg (126 lb) Body mass index is 20.03 kg/m.   Vital Signs:   Temp:  [98.1 F (36.7 C)-98.8 F (37.1 C)] 98.4 F (36.9 C) (08/27 0300) Pulse Rate:  [63-92] 64 (08/27 0300) Resp:  [17-21] 18 (08/27 0300) BP: (99-122)/(55-70) 115/65 (08/27 0300) SpO2:  [95 %-98 %] 96 % (08/27 0300) Weight:  [57.2 kg (126 lb)] 57.2 kg (126 lb) (08/27 0300) Last BM Date: 05/05/17  Weight change: Filed Weights   05/04/17 0437 05/05/17 0445 05/06/17 0300  Weight: 58 kg (127 lb 13.9 oz) 58.3 kg (128 lb 8.5 oz) 57.2 kg (126 lb)    Intake/Output:   Intake/Output Summary (Last 24 hours) at 05/06/17 0820 Last data filed at 05/06/17 0700  Gross per 24 hour  Intake             1200 ml  Output             2200 ml  Net            -1000 ml      Physical Exam   General:  Lying in bed . No resp difficulty HEENT: normal Neck: supple. JVP 5-6. Carotids 2+ bilat; no bruits. No lymphadenopathy or thryomegaly appreciated. Cor: PMI nondisplaced. Regular rate & rhythm. No rubs, gallops or murmurs. Lungs: clear Abdomen: soft, nontender, nondistended. No hepatosplenomegaly. No bruits or masses. Good bowel sounds. Extremities: no cyanosis, clubbing, rash, edema Neuro: alert & orientedx3, cranial nerves grossly intact. moves all 4 extremities w/o difficulty. Affect pleasant   Telemetry    Personally reviewed, NSR 60-70s   EKG    N/A  Labs    CBC  Recent Labs  05/04/17 0351 05/06/17 0517  WBC 8.9 8.7  HGB 9.7* 10.4*  HCT 30.2* 32.2*  MCV 91.2 92.5  PLT 219 258   Basic Metabolic Panel  Recent Labs  05/05/17 0415 05/06/17 0517  NA 132*  133*  K 3.8 4.0  CL 94* 95*  CO2 29 28  GLUCOSE 124* 125*  BUN 10 9  CREATININE 0.75 0.73  CALCIUM 8.1* 8.3*   Liver Function Tests No results for input(s): AST, ALT, ALKPHOS, BILITOT, PROT, ALBUMIN in the last 72 hours. No results for input(s): LIPASE, AMYLASE in the last 72 hours. Cardiac Enzymes No results for input(s): CKTOTAL, CKMB, CKMBINDEX, TROPONINI in the last 72 hours.  BNP: BNP (last 3 results) No results for input(s): BNP in the last 8760 hours.  ProBNP (last 3 results) No results for input(s): PROBNP in the last 8760 hours.   D-Dimer No results for input(s): DDIMER in the last 72 hours. Hemoglobin A1C No results for input(s): HGBA1C in the last 72 hours. Fasting Lipid Panel No results for input(s): CHOL, HDL, LDLCALC, TRIG, CHOLHDL, LDLDIRECT in the last 72 hours. Thyroid Function Tests No results for input(s): TSH, T4TOTAL, T3FREE, THYROIDAB in the last 72 hours.  Invalid input(s): FREET3  Other results:   Imaging    No results found.   Medications:     Scheduled Medications: . amiodarone  400 mg Oral BID  . aspirin EC  81 mg Oral Daily  .  bisacodyl  10 mg Oral Daily   Or  . bisacodyl  10 mg Rectal Daily  . Chlorhexidine Gluconate Cloth  6 each Topical Q0600  . citalopram  20 mg Oral Daily  . docusate sodium  200 mg Oral Daily  . furosemide  20 mg Oral Daily  . mouth rinse  15 mL Mouth Rinse BID  . metFORMIN  500 mg Oral BID WC  . pantoprazole  40 mg Oral Daily  . sacubitril-valsartan  1 tablet Oral BID  . sodium chloride flush  3 mL Intravenous Q12H  . spironolactone  25 mg Oral QHS  . warfarin  2 mg Oral ONCE-1800  . Warfarin - Physician Dosing Inpatient   Does not apply q1800    Infusions: . sodium chloride    . lactated ringers Stopped (04/24/17 0800)    PRN Medications: sodium chloride, alum & mag hydroxide-simeth, clonazePAM, HYDROcodone-acetaminophen, ibuprofen, metoprolol tartrate, ondansetron (ZOFRAN) IV, sodium chloride  flush, traMADol    Patient Profile   [name] is a 83 y.o. female with h/o MVP, HTN, DM2, scoliosis, and CAD. She had developed DOE over the past 2-3 months along with occasional peripheral edem   S/p MVR and CABG x 1 04/23/17. HF consulted post op with low mixed venous saturation and volume overload.   Assessment/Plan   1. Acute Combined Biventricular HF- EF dropped post op-->EF 25-30% with RV dysfunction.  - CO-OX 65% off milrinone - Volume status stable on lasix 20 .  - Continue entresto 24-26 mg BID.  - Creatinine stable. Hold off on carvedilol.  - Continue ted hose.     2. S/P CABG x1 MVR - bioprosthetic valve and LA clipping.  - Stable. 3. Shock Liver versus Passive Congestion - LFTs improved.  4  A fib RVR -back in NSR today on po amio. INR 1.96 5. Hypokalemia- K 3.8 this am. Continue to follow.  Supp ordered.  6. Hypomagnesium - supplemented 7. Post op anemia-   - Hgb 10.4 stable No overt bleeding. Continue to follow.  8. Deconditioning - Working with PT and CR. Recommending HHPT.   Doing very well. Will watch one more day off inotropes. If stable can go home in am.   Length of Stay: 13  Glori Bickers, MD  05/06/2017, 8:20 AM  Advanced Heart Failure Team Pager [phone] (M-F; 7a - 4p)  Please contact [facility] for night-coverage after hours (4p -7a ) and weekends on [email or web address]

## 2017-05-06 NOTE — Progress Notes (Addendum)
[address]            [place] [address]          [phone]   13 Days Post-Op Procedure(s) (LRB): MITRAL VALVE (MV) REPLACEMENT (N/A) CORONARY ARTERY BYPASS GRAFTING (CABG), ON PUMP, TIMES ONE, USING ENDOSCOPICALLY HARVESTED RIGHT GREATER SAPHENOUS VEIN (N/A) TRANSESOPHAGEAL ECHOCARDIOGRAM (TEE) (N/A) CLIPPING OF LEFT ATRIAL APPENDAGE  Total Length of Stay:  LOS: 13 days   Subjective: Patient has no specific complaints this am but wants to know "the plan".  Objective: Vital signs in last 24 hours: Temp:  [98.1 F (36.7 C)-98.8 F (37.1 C)] 98.4 F (36.9 C) (08/27 0300) Pulse Rate:  [63-92] 64 (08/27 0300) Cardiac Rhythm: Normal sinus rhythm (08/27 0425) Resp:  [17-21] 18 (08/27 0300) BP: (99-122)/(55-70) 115/65 (08/27 0300) SpO2:  [95 %-98 %] 96 % (08/27 0300) Weight:  [126 lb (57.2 kg)] 126 lb (57.2 kg) (08/27 0300)  Filed Weights   05/04/17 0437 05/05/17 0445 05/06/17 0300  Weight: 127 lb 13.9 oz (58 kg) 128 lb 8.5 oz (58.3 kg) 126 lb (57.2 kg)    Intake/Output from previous day: 08/26 0701 - 08/27 0700 In: 960 [P.O.:960] Out: 1900 [Urine:1900]  Heart: IRRR IRRR Lungs: Mostly clear Abdomen: Soft, non tender, bowel sounds present Extremities: Trace bilateral LE edema Wound: Clean and dry  Lab Results: CBC:  Recent Labs  05/04/17 0351 05/06/17 0517  WBC 8.9 8.7  HGB 9.7* 10.4*  HCT 30.2* 32.2*  PLT 219 328   BMET:   Recent Labs  05/05/17 0415 05/06/17 0517  NA 132* 133*  K 3.8 4.0  CL 94* 95*  CO2 29 28  GLUCOSE 124* 125*  BUN 10 9  CREATININE 0.75 0.73  CALCIUM 8.1* 8.3*    CMET: Lab Results  Component Value Date   WBC 8.7 05/06/2017   HGB 10.4 (L) 05/06/2017   HCT 32.2 (L) 05/06/2017   PLT 328 05/06/2017   GLUCOSE 125 (H) 05/06/2017   CHOL 163 08/22/2016   TRIG 101 08/22/2016   HDL 65 08/22/2016   LDLDIRECT 143.6 06/04/2012   LDLCALC 78 08/22/2016   ALT 765 (H) 05/02/2017   AST 69 (H)  05/02/2017   NA 133 (L) 05/06/2017   K 4.0 05/06/2017   CL 95 (L) 05/06/2017   CREATININE 0.73 05/06/2017   BUN 9 05/06/2017   CO2 28 05/06/2017   TSH 0.75 09/20/2014   INR 1.96 05/06/2017   HGBA1C 6.2 (H) 04/19/2017   MICROALBUR 0.2 05/21/2014      PT/INR:   Recent Labs  05/06/17 0517  LABPROT 22.6*  INR 1.96   Radiology: No results found.   Assessment/Plan: S/P Procedure(s) (LRB): MITRAL VALVE (MV) REPLACEMENT (N/A) CORONARY ARTERY BYPASS GRAFTING (CABG), ON PUMP, TIMES ONE, USING ENDOSCOPICALLY HARVESTED RIGHT GREATER SAPHENOUS VEIN (N/A) TRANSESOPHAGEAL ECHOCARDIOGRAM (TEE) (N/A) CLIPPING OF LEFT ATRIAL APPENDAGE  1. CV-Previous A fib with CVR. SR this am with HR 60-70's. Co ox 65.4 this am. Off Milrinone drip.  On Amiodarone 400 mg bid, Entresto 24/26 mg bid,Spironolactone 25 mg daily and Coumadin. INR decreased from 2.13 to 1.96. Will give 2 mg again tonight. 2. Pulmonary-On 2 liters of oxygen via [place].Will wean as able over next several days.  Encourage incentive spirometer 3. Acute combined biventricular heart failure-on Lasix 20 mg PO daily. HF following. 4. ABL anemia- H and H this am up  to 10.4 and 32.2 5. CBGs 94/160/125. On Metformin 500 mg bid.  Pre op HGA1C 6.2. 6. Hyponatremia-sodium up to 133 and is related to diuresis. Monitor. 7. Elevated transaminases but decreasing-continue to monitor   ZIMMERMAN,DONIELLE M PA-C 05/06/2017 7:12 AM    I have seen and examined the patient and agree with the assessment and plan as outlined.  Looks great and feels much better since she converted to NSR.  Tentatively plan d/c home tomorrow.  [name], MD 05/06/2017 8:44 AM

## 2017-05-06 NOTE — Progress Notes (Signed)
Occupational Therapy Treatment Patient Details Name: [name] MRN: [id number] DOB: Oct 15, 1933 Today's Date: 05/06/2017    History of present illness [name] is a 83 y.o. female with h/o MVP, HTN, DM2, scoliosis, and CAD. She had developed DOE over the past 2-3 months along with occasional peripheral edema. TEE 03/04/17 showed severe MR and severe LAE. L/RHC 03/28/17 with severe multivessel disease. Pt underwent CABG x 1 and MVR replacement 04/23/17.    OT comments  Pt completed toileting and standing grooming with supervision. Sat as OT washed her hair at the sink. Pt needing verbal cues to avoid pushing up on hands with sit<>stand. Improving activity tolerance.   Follow Up Recommendations  No OT follow up;Supervision/Assistance - 24 hour    Equipment Recommendations  3 in 1 bedside commode    Recommendations for Other Services      Precautions / Restrictions Precautions Precautions: Fall;Sternal Precaution Comments: recalls sternal precautions, but does not consistently generalize with sit to stand Restrictions Weight Bearing Restrictions: Yes       Mobility Bed Mobility               General bed mobility comments: pt in chair  Transfers Overall transfer level: Needs assistance Equipment used: Rolling walker (2 wheeled) Transfers: Sit to/from Stand Sit to Stand: Supervision         General transfer comment: cues to maintain sternal precautions, encouraged hands on knees    Balance     Sitting balance-Leahy Scale: Good       Standing balance-Leahy Scale: Fair Standing balance comment: able to stand at sink without support                           ADL either performed or assessed with clinical judgement   ADL Overall ADL's : Needs assistance/impaired     Grooming: Oral care;Standing;Supervision/safety;Applying deodorant Grooming Details (indicate cue type and reason): washed pt's hair in sitting over sink                  Toilet Transfer: Supervision/safety;Ambulation;RW   Toileting- Clothing Manipulation and Hygiene: Supervision/safety;Sit to/from stand Toileting - Clothing Manipulation Details (indicate cue type and reason): managed mesh panties without assistance     Functional mobility during ADLs: Supervision/safety;Rolling walker General ADL Comments: Pt feeling well, good tolerance of activity.     Vision       Perception     Praxis      Cognition Arousal/Alertness: Awake/alert Behavior During Therapy: WFL for tasks assessed/performed Overall Cognitive Status: Within Functional Limits for tasks assessed                                          Exercises     Shoulder Instructions       General Comments      Pertinent Vitals/ Pain       Pain Score: 0-No pain  Home Living                                          Prior Functioning/Environment              Frequency  Min 2X/week        Progress Toward Goals  OT Goals(current goals can  now be found in the care plan section)  Progress towards OT goals: Progressing toward goals  Acute Rehab OT Goals Patient Stated Goal: To be able to cook again  OT Goal Formulation: With patient Time For Goal Achievement: 05/13/17 Potential to Achieve Goals: Good  Plan Discharge plan remains appropriate    Co-evaluation                 AM-PAC PT "6 Clicks" Daily Activity     Outcome Measure   Help from another person eating meals?: None Help from another person taking care of personal grooming?: A Little Help from another person toileting, which includes using toliet, bedpan, or urinal?: A Little Help from another person bathing (including washing, rinsing, drying)?: A Little Help from another person to put on and taking off regular upper body clothing?: None Help from another person to put on and taking off regular lower body clothing?: A Little 6 Click Score: 20    End of  Session Equipment Utilized During Treatment: Rolling walker  OT Visit Diagnosis: Unsteadiness on feet (R26.81)   Activity Tolerance Patient tolerated treatment well   Patient Left in chair;with call bell/phone within reach   Nurse Communication          Time: 1030-1314 OT Time Calculation (min): 25 min  Charges: OT General Charges $OT Visit: 1 Procedure OT Treatments $Self Care/Home Management : 23-37 mins  05/06/2017 Nestor Lewandowsky, OTR/L Pager: [phone]  Malka So 05/06/2017, 11:08 AM

## 2017-05-06 NOTE — Progress Notes (Signed)
CARDIAC REHAB PHASE I   PRE:  Rate/Rhythm: 41 SR  BP:  Sitting: 141/60        SaO2: 98 RA  MODE:  Ambulation: 220 ft   POST:  Rate/Rhythm: 76 SR  BP:  Sitting: 133/82         SaO2: 98 RA  Pt ambulated 220 ft on RA, rolling walker, IV, assist x1, steady gait, good pace, tolerated well with no complaints. VSS. Encouraged additional ambulation x2 today. Pt to recliner after walk, feet elevated, call bell within reach. Will follow.  [id number] Lenna Sciara, RN, BSN 05/06/2017 9:58 AM

## 2017-05-06 NOTE — Progress Notes (Signed)
Chest tube sutures removed. Pt. Tolerated well. Half inch steri strips and benzoin applied

## 2017-05-06 NOTE — Progress Notes (Addendum)
Pt. Requests pure wick for night time

## 2017-05-06 NOTE — Care Management Important Message (Signed)
Important Message  Patient Details  Name: [name] MRN: [id number] Date of Birth: 08-Jan-1934   Medicare Important Message Given:  Yes    Apryl Brymer 05/06/2017, 1:57 PM

## 2017-05-07 LAB — COOXEMETRY PANEL
CARBOXYHEMOGLOBIN: 1.4 % (ref 0.5–1.5)
Carboxyhemoglobin: 1 % (ref 0.5–1.5)
Methemoglobin: 1.2 % (ref 0.0–1.5)
Methemoglobin: 0.9 % (ref 0.0–1.5)
O2 Saturation: 51 %
O2 Saturation: 44 %
TOTAL HEMOGLOBIN: 10.4 g/dL — AB (ref 12.0–16.0)
Total hemoglobin: 10.2 g/dL — ABNORMAL LOW (ref 12.0–16.0)

## 2017-05-07 LAB — PROTIME-INR
INR: 1.64
Prothrombin Time: 19.6 seconds — ABNORMAL HIGH (ref 11.4–15.2)

## 2017-05-07 LAB — COMPREHENSIVE METABOLIC PANEL
ALK PHOS: 96 U/L (ref 38–126)
ALT: 185 U/L — AB (ref 14–54)
AST: 22 U/L (ref 15–41)
Albumin: 2.8 g/dL — ABNORMAL LOW (ref 3.5–5.0)
Anion gap: 8 (ref 5–15)
BUN: 11 mg/dL (ref 6–20)
CALCIUM: 8.5 mg/dL — AB (ref 8.9–10.3)
CO2: 28 mmol/L (ref 22–32)
CREATININE: 0.79 mg/dL (ref 0.44–1.00)
Chloride: 95 mmol/L — ABNORMAL LOW (ref 101–111)
Glucose, Bld: 139 mg/dL — ABNORMAL HIGH (ref 65–99)
Potassium: 4 mmol/L (ref 3.5–5.1)
Sodium: 131 mmol/L — ABNORMAL LOW (ref 135–145)
Total Bilirubin: 0.8 mg/dL (ref 0.3–1.2)
Total Protein: 6.1 g/dL — ABNORMAL LOW (ref 6.5–8.1)

## 2017-05-07 LAB — GLUCOSE, CAPILLARY
GLUCOSE-CAPILLARY: 135 mg/dL — AB (ref 65–99)
GLUCOSE-CAPILLARY: 143 mg/dL — AB (ref 65–99)
GLUCOSE-CAPILLARY: 141 mg/dL — AB (ref 65–99)
GLUCOSE-CAPILLARY: 126 mg/dL — AB (ref 65–99)

## 2017-05-07 MED ORDER — AMIODARONE HCL 200 MG PO TABS: 200.0000 mg | ORAL_TABLET | Freq: Two times a day (BID) | ORAL | 1 refills | Status: DC

## 2017-05-07 MED ORDER — AMIODARONE HCL 200 MG PO TABS
200.0000 mg | ORAL_TABLET | Freq: Two times a day (BID) | ORAL | Status: DC
  Administered 2017-05-07 – 2017-05-08 (×3): 200 mg via ORAL
  Filled 2017-05-07 (×3): qty 1

## 2017-05-07 MED ORDER — ASPIRIN 81 MG PO TBEC: 81.0000 mg | DELAYED_RELEASE_TABLET | Freq: Every day | ORAL | Status: DC

## 2017-05-07 MED ORDER — SACUBITRIL-VALSARTAN 24-26 MG PO TABS: 1.0000 | ORAL_TABLET | Freq: Two times a day (BID) | ORAL | 1 refills | Status: DC

## 2017-05-07 MED ORDER — SPIRONOLACTONE 25 MG PO TABS: 25.0000 mg | ORAL_TABLET | Freq: Every day | ORAL | 1 refills | Status: DC

## 2017-05-07 MED ORDER — CLONAZEPAM 0.5 MG PO TABS: ORAL_TABLET | ORAL | 0 refills | Status: DC

## 2017-05-07 MED ORDER — FUROSEMIDE 20 MG PO TABS: 20.0000 mg | ORAL_TABLET | Freq: Every day | ORAL | 1 refills | Status: DC

## 2017-05-07 MED ORDER — WARFARIN SODIUM 2.5 MG PO TABS: 2.5000 mg | ORAL_TABLET | Freq: Every day | ORAL | 1 refills | Status: DC

## 2017-05-07 MED ORDER — WARFARIN SODIUM 2.5 MG PO TABS
2.5000 mg | ORAL_TABLET | Freq: Every day | ORAL | Status: DC
  Administered 2017-05-07: 2.5 mg via ORAL
  Filled 2017-05-07: qty 1

## 2017-05-07 MED ORDER — HYDROCODONE-ACETAMINOPHEN 5-325 MG PO TABS: 0.5000 | ORAL_TABLET | Freq: Four times a day (QID) | ORAL | 0 refills | Status: DC | PRN

## 2017-05-07 NOTE — Progress Notes (Addendum)
[address]            [place] [address]          [phone]   14 Days Post-Op Procedure(s) (LRB): MITRAL VALVE (MV) REPLACEMENT (N/A) CORONARY ARTERY BYPASS GRAFTING (CABG), ON PUMP, TIMES ONE, USING ENDOSCOPICALLY HARVESTED RIGHT GREATER SAPHENOUS VEIN (N/A) TRANSESOPHAGEAL ECHOCARDIOGRAM (TEE) (N/A) CLIPPING OF LEFT ATRIAL APPENDAGE  Total Length of Stay:  LOS: 14 days   Subjective: Patient without complaints this am. She is looking forward to going home.  Objective: Vital signs in last 24 hours: Temp:  [98.1 F (36.7 C)-98.6 F (37 C)] 98.5 F (36.9 C) (08/28 0541) Pulse Rate:  [60-68] 68 (08/27 2339) Cardiac Rhythm: Other (Comment);Heart block (08/27 1900) Resp:  [15-20] 20 (08/27 2339) BP: (116-148)/(42-68) 127/68 (08/28 0541) SpO2:  [98 %-100 %] 100 % (08/27 2339) Weight:  [124 lb 11.2 oz (56.6 kg)] 124 lb 11.2 oz (56.6 kg) (08/28 0541)  Filed Weights   05/05/17 0445 05/06/17 0300 05/07/17 0541  Weight: 128 lb 8.5 oz (58.3 kg) 126 lb (57.2 kg) 124 lb 11.2 oz (56.6 kg)    Intake/Output from previous day: 08/27 0701 - 08/28 0700 In: -  Out: 400 [Urine:400]  Heart:RRR Lungs: Mostly clear Abdomen: Soft, non tender, bowel sounds present Extremities: Trace bilateral LE edema Wound: Clean and dry  Lab Results: CBC:  Recent Labs  05/06/17 0517  WBC 8.7  HGB 10.4*  HCT 32.2*  PLT 328   BMET:   Recent Labs  05/06/17 0517 05/07/17 0430  NA 133* 131*  K 4.0 4.0  CL 95* 95*  CO2 28 28  GLUCOSE 125* 139*  BUN 9 11  CREATININE 0.73 0.79  CALCIUM 8.3* 8.5*    CMET: Lab Results  Component Value Date   WBC 8.7 05/06/2017   HGB 10.4 (L) 05/06/2017   HCT 32.2 (L) 05/06/2017   PLT 328 05/06/2017   GLUCOSE 139 (H) 05/07/2017   CHOL 163 08/22/2016   TRIG 101 08/22/2016   HDL 65 08/22/2016   LDLDIRECT 143.6 06/04/2012   LDLCALC 78 08/22/2016   ALT 185 (H) 05/07/2017   AST 22 05/07/2017   NA 131 (L)  05/07/2017   K 4.0 05/07/2017   CL 95 (L) 05/07/2017   CREATININE 0.79 05/07/2017   BUN 11 05/07/2017   CO2 28 05/07/2017   TSH 0.75 09/20/2014   INR 1.64 05/07/2017   HGBA1C 6.2 (H) 04/19/2017   MICROALBUR 0.2 05/21/2014      PT/INR:   Recent Labs  05/07/17 0430  LABPROT 19.6*  INR 1.64   Radiology: Dg Chest 2 View  Result Date: 05/06/2017 CLINICAL DATA:  Weakness. History of congestive heart failure, valvular heart disease, previous CABG and mitral valve replacement, diabetes. EXAM: CHEST  2 VIEW COMPARISON:  Chest x-ray of May 02, 2017 FINDINGS: The lungs are well-expanded and clear. There is no infiltrate or pleural effusion. There is infrahilar density on the right and in the left retrocardiac region which likely reflects subsegmental atelectasis. The heart and pulmonary vascularity are normal. There is a left atrial appendage clip present. A prosthetic mitral valve ring is observed. There are post CABG changes. There is calcification in the wall of the aortic arch. A PICC line terminates in the midportion of the SVC. The patient has undergone undergone posterior thoracolumbar fusion. IMPRESSION: Bibasilar subsegmental atelectasis fairly stable on the  right but improved on the left. No pulmonary edema. Postsurgical changes as described. Thoracic aortic atherosclerosis. Electronically Signed   By: David  Martinique M.D.   On: 05/06/2017 08:28     Assessment/Plan: S/P Procedure(s) (LRB): MITRAL VALVE (MV) REPLACEMENT (N/A) CORONARY ARTERY BYPASS GRAFTING (CABG), ON PUMP, TIMES ONE, USING ENDOSCOPICALLY HARVESTED RIGHT GREATER SAPHENOUS VEIN (N/A) TRANSESOPHAGEAL ECHOCARDIOGRAM (TEE) (N/A) CLIPPING OF LEFT ATRIAL APPENDAGE  1. CV-Previous A fib with CVR. SR,first degree heart block this am.Co ox decreased to 51 this am. Has been offf Milrinone drip.  On Amiodarone 400 mg bid, Entresto 24/26 mg bid,Spironolactone 25 mg daily and Coumadin. INR decreased from 1.96 to 1.64. Will  likely give 2.5 mg at discharge. Will decrease Amiodarone. 2. Pulmonary-On room air. Encourage incentive spirometer 3. Acute combined biventricular heart failure-on Lasix 20 mg PO daily. HF following. 4. ABL anemia- H and H this am up to 10.4 and 32.2 5. CBGs 141/136/135. On Metformin 500 mg bid.  Pre op HGA1C 6.2. 7. Transaminases greatly decreased-AST 22, ALT 185, Alk Phos 96 8. Per heart failure, because co ox decreased discharge now on hold.   Nani Skillern PA-C 05/07/2017 7:17 AM   I have seen and examined the patient and agree with the assessment and plan as outlined.  Looks good but mixed venous co-ox down.  Agree w/ holding d/c for now.  HR 55-65 in sinus.  Will decrease amiodarone dose to see if increased HR might help.  Otherwise plan per advanced heart failure team.  [name], MD 05/07/2017 1:25 PM

## 2017-05-07 NOTE — Progress Notes (Signed)
Advanced Heart Failure Rounding Note  PCP: Dr Elease Hashimoto Primary Cardiologist: Dr Harrington Challenger   Subjective:    Pt underwent CABG x 1 and MVR replacement 04/23/17. Milrinone added 04/27/17 with Mixed venous saturation of 33%.   Overall feels good. Wants to go home. Denies SOB.    Objective:   Weight Range: 124 lb 11.2 oz (56.6 kg) Body mass index is 19.83 kg/m.   Vital Signs:   Temp:  [98.1 F (36.7 C)-98.6 F (37 C)] 98.3 F (36.8 C) (08/28 0824) Pulse Rate:  [60-68] 62 (08/28 0824) Resp:  [15-23] 23 (08/28 0824) BP: (109-148)/(42-68) 109/55 (08/28 0824) SpO2:  [100 %] 100 % (08/28 0824) Weight:  [124 lb 11.2 oz (56.6 kg)] 124 lb 11.2 oz (56.6 kg) (08/28 0541) Last BM Date: 05/06/17  Weight change: Filed Weights   05/05/17 0445 05/06/17 0300 05/07/17 0541  Weight: 128 lb 8.5 oz (58.3 kg) 126 lb (57.2 kg) 124 lb 11.2 oz (56.6 kg)    Intake/Output:   Intake/Output Summary (Last 24 hours) at 05/07/17 0848 Last data filed at 05/07/17 0745  Gross per 24 hour  Intake                0 ml  Output              825 ml  Net             -825 ml      Physical Exam   General:  Well appearing. No resp difficulty. Sitting in the chair.  HEENT: normal Neck: supple. no JVD. Carotids 2+ bilat; no bruits. No lymphadenopathy or thryomegaly appreciated. Cor: PMI nondisplaced. Regular rate & rhythm. No rubs, gallops or murmurs. Sternal incision approximated.  Lungs: clear Abdomen: soft, nontender, nondistended. No hepatosplenomegaly. No bruits or masses. Good bowel sounds. Extremities: no cyanosis, clubbing, rash, edema Neuro: alert & orientedx3, cranial nerves grossly intact. moves all 4 extremities w/o difficulty. Affect pleasant    Telemetry    Personally reviewed. NSR 60-70s.   EKG    N/A  Labs    CBC  Recent Labs  05/06/17 0517  WBC 8.7  HGB 10.4*  HCT 32.2*  MCV 92.5  PLT 762   Basic Metabolic Panel  Recent Labs  05/06/17 0517 05/07/17 0430  NA 133*  131*  K 4.0 4.0  CL 95* 95*  CO2 28 28  GLUCOSE 125* 139*  BUN 9 11  CREATININE 0.73 0.79  CALCIUM 8.3* 8.5*   Liver Function Tests  Recent Labs  05/07/17 0430  AST 22  ALT 185*  ALKPHOS 96  BILITOT 0.8  PROT 6.1*  ALBUMIN 2.8*   No results for input(s): LIPASE, AMYLASE in the last 72 hours. Cardiac Enzymes No results for input(s): CKTOTAL, CKMB, CKMBINDEX, TROPONINI in the last 72 hours.  BNP: BNP (last 3 results) No results for input(s): BNP in the last 8760 hours.  ProBNP (last 3 results) No results for input(s): PROBNP in the last 8760 hours.   D-Dimer No results for input(s): DDIMER in the last 72 hours. Hemoglobin A1C No results for input(s): HGBA1C in the last 72 hours. Fasting Lipid Panel No results for input(s): CHOL, HDL, LDLCALC, TRIG, CHOLHDL, LDLDIRECT in the last 72 hours. Thyroid Function Tests No results for input(s): TSH, T4TOTAL, T3FREE, THYROIDAB in the last 72 hours.  Invalid input(s): FREET3  Other results:   Imaging    No results found.   Medications:     Scheduled Medications: . amiodarone  200 mg Oral BID  . aspirin EC  81 mg Oral Daily  . bisacodyl  10 mg Oral Daily   Or  . bisacodyl  10 mg Rectal Daily  . Chlorhexidine Gluconate Cloth  6 each Topical Q0600  . citalopram  20 mg Oral Daily  . docusate sodium  200 mg Oral Daily  . furosemide  20 mg Oral Daily  . mouth rinse  15 mL Mouth Rinse BID  . metFORMIN  500 mg Oral BID WC  . pantoprazole  40 mg Oral Daily  . sacubitril-valsartan  1 tablet Oral BID  . sodium chloride flush  3 mL Intravenous Q12H  . spironolactone  25 mg Oral QHS  . Warfarin - Physician Dosing Inpatient   Does not apply q1800    Infusions: . sodium chloride    . lactated ringers Stopped (04/24/17 0800)    PRN Medications: sodium chloride, alum & mag hydroxide-simeth, clonazePAM, HYDROcodone-acetaminophen, ibuprofen, metoprolol tartrate, ondansetron (ZOFRAN) IV, sodium chloride flush,  traMADol    Patient Profile   [name] is a 83 y.o. female with h/o MVP, HTN, DM2, scoliosis, and CAD. She had developed DOE over the past 2-3 months along with occasional peripheral edem   S/p MVR and CABG x 1 04/23/17. HF consulted post op with low mixed venous saturation and volume overload.   Assessment/Plan   1. Acute Combined Biventricular HF- EF dropped post op-->EF 25-30% with RV dysfunction.  Today CO-OX is 51%. Repeat Milrinone now. If CO-OX > 58% can go home.  -Volume status stable. Continue lasix 20 mg daily.   - Continue entresto 24-26 mg BID.   - No bb for now.   2. S/P CABG x1 MVR - bioprosthetic valve and LA clipping.  - Stable. 3. Shock Liver versus Passive Congestion - LFTs improved.  4  A fib RVR -Maintaining NSR.  INR 1.64  5. Hypokalemia- K 3.8 this am. Continue to follow.  Supp ordered.  6. Hypomagnesium - supplemented 7. Post op anemia-   - No overt bleeding. Continue to follow.  8. Deconditioning -. Recommending HHPT.    CO-OX 51% with repeat down to 44%. Discussed with Dr Haroldine Laws and he would like to hold d/c.   Length of Stay: [place], NP  05/07/2017, 8:48 AM  Advanced Heart Failure Team Pager [phone] (M-F; 7a - 4p)  Please contact [facility] for night-coverage after hours (4p -7a ) and weekends on [email or web address]   Patient seen and examined with Darrick Grinder, NP. We discussed all aspects of the encounter. I agree with the assessment and plan as stated above.   Feels good. Volume status ok. Maintaining NSR but co-ox now falling 48 hours after stopping milrinone. Would continue to watch at least one more day. If co-ox remains low, may need to restart milrinone.   Glori Bickers, MD  1:59 PM

## 2017-05-07 NOTE — Progress Notes (Signed)
Ed completed with pt and husband, including managing HF and HF book. Voiced understanding. Will refer to [place].  [phone] Yves Dill CES, ACSM 10:48 AM 05/07/2017

## 2017-05-08 DIAGNOSIS — I080 Rheumatic disorders of both mitral and aortic valves: Secondary | ICD-10-CM

## 2017-05-08 LAB — PROTIME-INR
INR: 1.38
PROTHROMBIN TIME: 16.9 s — AB (ref 11.4–15.2)

## 2017-05-08 LAB — GLUCOSE, CAPILLARY
GLUCOSE-CAPILLARY: 109 mg/dL — AB (ref 65–99)
Glucose-Capillary: 128 mg/dL — ABNORMAL HIGH (ref 65–99)

## 2017-05-08 LAB — BASIC METABOLIC PANEL
Anion gap: 11 (ref 5–15)
BUN: 10 mg/dL (ref 6–20)
CHLORIDE: 92 mmol/L — AB (ref 101–111)
CO2: 27 mmol/L (ref 22–32)
CREATININE: 0.76 mg/dL (ref 0.44–1.00)
Calcium: 8.8 mg/dL — ABNORMAL LOW (ref 8.9–10.3)
GFR calc Af Amer: >60 mL/min (ref >60)
GLUCOSE: 127 mg/dL — AB (ref 65–99)
Potassium: 4.1 mmol/L (ref 3.5–5.1)
SODIUM: 130 mmol/L — AB (ref 135–145)

## 2017-05-08 LAB — COOXEMETRY PANEL
CARBOXYHEMOGLOBIN: 1 % (ref 0.5–1.5)
Carboxyhemoglobin: 1.4 % (ref 0.5–1.5)
METHEMOGLOBIN: 0.8 % (ref 0.0–1.5)
Methemoglobin: 1.3 % (ref 0.0–1.5)
O2 SAT: 53.5 %
O2 Saturation: 48.5 %
Total hemoglobin: 11.1 g/dL — ABNORMAL LOW (ref 12.0–16.0)
Total hemoglobin: 10.4 g/dL — ABNORMAL LOW (ref 12.0–16.0)

## 2017-05-08 LAB — MAGNESIUM: MAGNESIUM: 1.2 mg/dL — AB (ref 1.7–2.4)

## 2017-05-08 MED ORDER — MAGNESIUM SULFATE 4 GM/100ML IV SOLN
4.0000 g | Freq: Once | INTRAVENOUS | Status: AC
  Administered 2017-05-08: 4 g via INTRAVENOUS
  Filled 2017-05-08: qty 100

## 2017-05-08 MED ORDER — MAGNESIUM OXIDE 400 (241.3 MG) MG PO TABS: 400.0000 mg | ORAL_TABLET | Freq: Two times a day (BID) | ORAL | 0 refills | Status: DC

## 2017-05-08 MED ORDER — MAGNESIUM OXIDE 400 (241.3 MG) MG PO TABS: 400.0000 mg | ORAL_TABLET | Freq: Two times a day (BID) | ORAL | Status: DC

## 2017-05-08 MED ORDER — MAGNESIUM OXIDE 400 (241.3 MG) MG PO TABS
400.0000 mg | ORAL_TABLET | Freq: Every day | ORAL | Status: DC
  Administered 2017-05-08: 400 mg via ORAL
  Filled 2017-05-08: qty 1

## 2017-05-08 NOTE — Care Management Note (Signed)
Case Management Note Previous CM note initiated by [name], RN--04/30/2017, 2:59 PM   Patient Details  Name: [name] MRN: [id number] Date of Birth: December 09, 1933  Subjective/Objective:    From home with spouse, POD 1 CABG, MVR, wean drips, dc tubes today or tomorrow depending on output.  8/21 [name], BSN -POD 7 CABG, MVR, afib , hyponatremia, conts on amio, milrinone, lasix iv, cvp low, co - ox 62%.  Per pt eval rec HHPT , 3 n 1.   NCM left [facility] agency list with spouse and patient, they will look over and NCM will check back with them tomorrow, they state they have a 3 n 1 already.   8/22 [name], BSN- NCM spoke with patient in room they have a check beside [facility], referral made to [facility], awaiting call back to see if they can take her for HHPT. [name] state they can take her at [place] [facility] for [place].  Will need HHPT order with face to face.             Action/Plan: NCM will cont to follow for dc needs.   Additional CM follow up notes:  05/08/17- [place] RN, CM [phone]- pt for d/c home today- per previous CM notes- referral for [facility] services has been placed to [facility]- order has been made for HHPT- spoke with pt at bedside- confirmed choice for [facility] to provide [place]- notified mary with [facility] of pt's discharge for today- also reviewed with pt DME needs- pt at this time has declined 3n1 need- states she has a shower bench and an elevated toilet seat. Pt to d/c home with spouse and daughter to assist.   Expected Discharge Date:    05/08/17             Expected Discharge Plan:  [name]  In-House Referral:     Discharge planning Services  CM Consult  Post Acute Care Choice:  Durable Medical Equipment, Home Health Choice offered to:  Patient  DME Arranged:  3-N-1 DME Agency:  [place]:  PT [place]:  [facility] ([facility])  Status of Service:  Completed, signed off  If discussed at [employer] of Stay Meetings, dates discussed:  8/27  Additional Comments:  Dawayne Patricia, RN 05/08/2017, 12:55 PM [phone]

## 2017-05-08 NOTE — Progress Notes (Signed)
Occupational Therapy Treatment Patient Details Name: [name] MRN: [id number] DOB: 1934-03-02 Today's Date: 05/08/2017    History of present illness [name] is a 83 y.o. female with h/o MVP, HTN, DM2, scoliosis, and CAD. She had developed DOE over the past 2-3 months along with occasional peripheral edema. TEE 03/04/17 showed severe MR and severe LAE. L/RHC 03/28/17 with severe multivessel disease. Pt underwent CABG x 1 and MVR replacement 04/23/17.    OT comments  Pt is able to perform ADLs with supervision.  She requires min cues for sternal precautions.  All instruction completed.    Follow Up Recommendations  No OT follow up;Supervision/Assistance - 24 hour    Equipment Recommendations  3 in 1 bedside commode    Recommendations for Other Services      Precautions / Restrictions Precautions Precautions: Fall;Sternal Precaution Comments: able to verbalize sternal precautions, but requires min cues to adhere to them  Restrictions Weight Bearing Restrictions: Yes       Mobility Bed Mobility Overal bed mobility: Needs Assistance Bed Mobility: Rolling;Sidelying to Sit;Sit to Sidelying Rolling: Supervision Sidelying to sit: Supervision     Sit to sidelying: Supervision General bed mobility comments: Pt requires verbal cues to not push with UEs   Transfers Overall transfer level: Needs assistance Equipment used: Rolling walker (2 wheeled) Transfers: Sit to/from [facility] Sit to Stand: Supervision Stand pivot transfers: Supervision            Balance Overall balance assessment: Needs assistance;History of Falls Sitting-balance support: No upper extremity supported;Feet supported Sitting balance-Leahy Scale: Good     Standing balance support: No upper extremity supported;During functional activity Standing balance-Leahy Scale: Fair                             ADL either performed or assessed with clinical judgement   ADL  Overall ADL's : Needs assistance/impaired     Grooming: Wash/dry hands;Wash/dry face;Oral care;Brushing hair;Supervision/safety;Standing                   Toilet Transfer: Supervision/safety;Ambulation;RW   Toileting- Clothing Manipulation and Hygiene: Set up;Sit to/from stand   Tub/ Shower Transfer: Min guard;Ambulation;Rolling walker Tub/Shower Transfer Details (indicate cue type and reason): reviewed safe technique.  Pt reports spouse will provide supervision/assist, and that she has shower seat  Functional mobility during ADLs: Supervision/safety;Rolling walker General ADL Comments: Pt does not feel she needs to practice LB ADLs      Vision       Perception     Praxis      Cognition Arousal/Alertness: Awake/alert Behavior During Therapy: WFL for tasks assessed/performed Overall Cognitive Status: Within Functional Limits for tasks assessed                                          Exercises     Shoulder Instructions       General Comments reviewed precautions.  Instructed her how to add bag to walker and cautioned her from trying to amulate with RW while attempting to carry items.  Discussed correct method for donning bra     Pertinent Vitals/ Pain       Pain Assessment: No/denies pain  Home Living  Prior Functioning/Environment              Frequency  Min 2X/week        Progress Toward Goals  OT Goals(current goals can now be found in the care plan section)  Progress towards OT goals: Progressing toward goals     Plan Discharge plan remains appropriate    Co-evaluation                 AM-PAC PT "6 Clicks" Daily Activity     Outcome Measure   Help from another person eating meals?: None Help from another person taking care of personal grooming?: A Little Help from another person toileting, which includes using toliet, bedpan, or urinal?: A Little Help  from another person bathing (including washing, rinsing, drying)?: A Little Help from another person to put on and taking off regular upper body clothing?: A Little Help from another person to put on and taking off regular lower body clothing?: A Little 6 Click Score: 19    End of Session Equipment Utilized During Treatment: Rolling walker  OT Visit Diagnosis: Unsteadiness on feet (R26.81)   Activity Tolerance Patient tolerated treatment well   Patient Left in bed;with call bell/phone within reach   Nurse Communication Mobility status        Time: 8206-0156 OT Time Calculation (min): 17 min  Charges: OT General Charges $OT Visit: 1 Visit OT Treatments $Self Care/Home Management : 8-22 mins  [facility], OTR/L [phone]    Lucille Passy M 05/08/2017, 2:12 PM

## 2017-05-08 NOTE — Progress Notes (Signed)
Pt declined walking, wants to wash up and will probably walk with husband. Will f/u if she does not walk.  Yves Dill CES, ACSM 10:37 AM 05/08/2017

## 2017-05-08 NOTE — Progress Notes (Addendum)
[address]            [place] [address]          [phone]   15 Days Post-Op Procedure(s) (LRB): MITRAL VALVE (MV) REPLACEMENT (N/A) CORONARY ARTERY BYPASS GRAFTING (CABG), ON PUMP, TIMES ONE, USING ENDOSCOPICALLY HARVESTED RIGHT GREATER SAPHENOUS VEIN (N/A) TRANSESOPHAGEAL ECHOCARDIOGRAM (TEE) (N/A) CLIPPING OF LEFT ATRIAL APPENDAGE  Total Length of Stay:  LOS: 15 days   Subjective: Patient without complaints this am. She hopes she can go home.  Objective: Vital signs in last 24 hours: Temp:  [97.4 F (36.3 C)-98.6 F (37 C)] 98 F (36.7 C) (08/29 0413) Pulse Rate:  [50-83] 83 (08/29 0413) Cardiac Rhythm: Sinus bradycardia;Bundle branch block;Heart block (08/28 1900) Resp:  [15-29] 17 (08/29 0413) BP: (99-138)/(53-77) 125/77 (08/29 0413) SpO2:  [97 %-100 %] 100 % (08/29 0413) Weight:  [123 lb 4.8 oz (55.9 kg)] 123 lb 4.8 oz (55.9 kg) (08/29 0413)  Filed Weights   05/06/17 0300 05/07/17 0541 05/08/17 0413  Weight: 126 lb (57.2 kg) 124 lb 11.2 oz (56.6 kg) 123 lb 4.8 oz (55.9 kg)    Intake/Output from previous day: 08/28 0701 - 08/29 0700 In: 600 [P.O.:600] Out: 1075 [Urine:1075]  Heart:RRR Lungs: Mostly clear Abdomen: Soft, non tender, bowel sounds present Extremities: Trace bilateral LE edema Wound: Clean and dry  Lab Results: CBC:  Recent Labs  05/06/17 0517  WBC 8.7  HGB 10.4*  HCT 32.2*  PLT 328   BMET:   Recent Labs  05/07/17 0430 05/08/17 0414  NA 131* 130*  K 4.0 4.1  CL 95* 92*  CO2 28 27  GLUCOSE 139* 127*  BUN 11 10  CREATININE 0.79 0.76  CALCIUM 8.5* 8.8*    CMET: Lab Results  Component Value Date   WBC 8.7 05/06/2017   HGB 10.4 (L) 05/06/2017   HCT 32.2 (L) 05/06/2017   PLT 328 05/06/2017   GLUCOSE 127 (H) 05/08/2017   CHOL 163 08/22/2016   TRIG 101 08/22/2016   HDL 65 08/22/2016   LDLDIRECT 143.6 06/04/2012   LDLCALC 78 08/22/2016   ALT 185 (H) 05/07/2017   AST 22 05/07/2017     NA 130 (L) 05/08/2017   K 4.1 05/08/2017   CL 92 (L) 05/08/2017   CREATININE 0.76 05/08/2017   BUN 10 05/08/2017   CO2 27 05/08/2017   TSH 0.75 09/20/2014   INR 1.38 05/08/2017   HGBA1C 6.2 (H) 04/19/2017   MICROALBUR 0.2 05/21/2014      PT/INR:   Recent Labs  05/08/17 0414  LABPROT 16.9*  INR 1.38   Radiology: No results found.   Assessment/Plan: S/P Procedure(s) (LRB): MITRAL VALVE (MV) REPLACEMENT (N/A) CORONARY ARTERY BYPASS GRAFTING (CABG), ON PUMP, TIMES ONE, USING ENDOSCOPICALLY HARVESTED RIGHT GREATER SAPHENOUS VEIN (N/A) TRANSESOPHAGEAL ECHOCARDIOGRAM (TEE) (N/A) CLIPPING OF LEFT ATRIAL APPENDAGE  1. CV-Previous A fib with CVR. HR in the 80's, PVCs this am.Co ox decreased again to 48 this am. Has been offf Milrinone drip. Will defer management heart failure. On Amiodarone 200 mg bid, Entresto 24/26 mg bid,Spironolactone 25 mg daily and Coumadin. INR decreased from 1.64 to 1.38. Will likely give 2.5 mg again tonight as will not see result from 2.5 until am. 2. Pulmonary-On room air. Encourage incentive spirometer 3. Acute combined biventricular heart failure-on Lasix 20 mg PO daily. HF following. 4. ABL anemia- H and H  this am up to 10.4 and 32.2 5. CBGs 141/126/128. On Metformin 500 mg bid.  Pre op HGA1C 6.2. 6. Hypomagnesemia-supplement   ZIMMERMAN,DONIELLE M PA-C 05/08/2017 7:14 AM   I have seen and examined the patient and agree with the assessment and plan as outlined.  Co-ox essentially stable.  Clinically patient does not look like she is going into CHF and CVP remains low.  I think she is probably okay to go home. I would not favor resuming milrinone w/out f/u RHC to more accurately assess but will defer to AHF team.   [name], MD 05/08/2017 8:27 AM

## 2017-05-08 NOTE — Progress Notes (Signed)
Advanced Heart Failure Rounding Note  PCP: Dr Elease Hashimoto Primary Cardiologist: Dr Harrington Challenger   Subjective:    Pt underwent CABG x 1 and MVR replacement 04/23/17. Milrinone added 04/27/17 with Mixed venous saturation of 33%.   Feeling good. Anxious to go home. Denies SOB, lightheadedness, or dizziness.   Coox 48%. Not on inotrope support.   Objective:   Weight Range: 123 lb 4.8 oz (55.9 kg) Body mass index is 19.6 kg/m.   Vital Signs:   Temp:  [97.4 F (36.3 C)-98.6 F (37 C)] 98.5 F (36.9 C) (08/29 0846) Pulse Rate:  [50-83] 83 (08/29 0413) Resp:  [15-24] 17 (08/29 0413) BP: (99-138)/(53-77) 125/77 (08/29 0413) SpO2:  [97 %-100 %] 100 % (08/29 0413) Weight:  [123 lb 4.8 oz (55.9 kg)] 123 lb 4.8 oz (55.9 kg) (08/29 0413) Last BM Date: 05/06/17  Weight change: Filed Weights   05/06/17 0300 05/07/17 0541 05/08/17 0413  Weight: 126 lb (57.2 kg) 124 lb 11.2 oz (56.6 kg) 123 lb 4.8 oz (55.9 kg)    Intake/Output:   Intake/Output Summary (Last 24 hours) at 05/08/17 1015 Last data filed at 05/07/17 1837  Gross per 24 hour  Intake              360 ml  Output              650 ml  Net             -290 ml      Physical Exam   General: Elderly appearing. No resp difficulty. HEENT: Normal Neck: Supple. JVP 5-6. Carotids 2+ bilat; no bruits. No thyromegaly or nodule noted. Cor: PMI nondisplaced. RRR, No M/G/R noted Lungs: CTAB, normal effort. Abdomen: Soft, non-tender, non-distended, no HSM. No bruits or masses. +BS  Extremities: No cyanosis, clubbing, rash, R and LLE no edema.  Neuro: Alert & orientedx3, cranial nerves grossly intact. moves all 4 extremities w/o difficulty. Affect pleasant   Telemetry    Personally reviewed, NSR 60-70s    EKG    N/A  Labs    CBC  Recent Labs  05/06/17 0517  WBC 8.7  HGB 10.4*  HCT 32.2*  MCV 92.5  PLT 062   Basic Metabolic Panel  Recent Labs  05/07/17 0430 05/08/17 0414  NA 131* 130*  K 4.0 4.1  CL 95* 92*    CO2 28 27  GLUCOSE 139* 127*  BUN 11 10  CREATININE 0.79 0.76  CALCIUM 8.5* 8.8*  MG  --  1.2*   Liver Function Tests  Recent Labs  05/07/17 0430  AST 22  ALT 185*  ALKPHOS 96  BILITOT 0.8  PROT 6.1*  ALBUMIN 2.8*   No results for input(s): LIPASE, AMYLASE in the last 72 hours. Cardiac Enzymes No results for input(s): CKTOTAL, CKMB, CKMBINDEX, TROPONINI in the last 72 hours.  BNP: BNP (last 3 results) No results for input(s): BNP in the last 8760 hours.  ProBNP (last 3 results) No results for input(s): PROBNP in the last 8760 hours.   D-Dimer No results for input(s): DDIMER in the last 72 hours. Hemoglobin A1C No results for input(s): HGBA1C in the last 72 hours. Fasting Lipid Panel No results for input(s): CHOL, HDL, LDLCALC, TRIG, CHOLHDL, LDLDIRECT in the last 72 hours. Thyroid Function Tests No results for input(s): TSH, T4TOTAL, T3FREE, THYROIDAB in the last 72 hours.  Invalid input(s): FREET3  Other results:   Imaging   No results found.  Medications:  Scheduled Medications: . amiodarone  200 mg Oral BID  . aspirin EC  81 mg Oral Daily  . bisacodyl  10 mg Oral Daily   Or  . bisacodyl  10 mg Rectal Daily  . Chlorhexidine Gluconate Cloth  6 each Topical Q0600  . citalopram  20 mg Oral Daily  . docusate sodium  200 mg Oral Daily  . magnesium oxide  400 mg Oral Daily  . mouth rinse  15 mL Mouth Rinse BID  . metFORMIN  500 mg Oral BID WC  . pantoprazole  40 mg Oral Daily  . sacubitril-valsartan  1 tablet Oral BID  . sodium chloride flush  3 mL Intravenous Q12H  . spironolactone  25 mg Oral QHS  . warfarin  2.5 mg Oral q1800  . Warfarin - Physician Dosing Inpatient   Does not apply q1800    Infusions: . sodium chloride    . lactated ringers Stopped (04/24/17 0800)    PRN Medications: sodium chloride, alum & mag hydroxide-simeth, clonazePAM, HYDROcodone-acetaminophen, ibuprofen, metoprolol tartrate, ondansetron (ZOFRAN) IV, sodium  chloride flush, traMADol    Patient Profile   [name] is a 83 y.o. female with h/o MVP, HTN, DM2, scoliosis, and CAD. She had developed DOE over the past 2-3 months along with occasional peripheral edem   S/p MVR and CABG x 1 04/23/17. HF consulted post op with low mixed venous saturation and volume overload.   Assessment/Plan   1. Acute Combined Biventricular HF- EF dropped post op-->EF 25-30% with RV dysfunction.  Today CO-OX is 51%. Repeat Milrinone now. If CO-OX > 58% can go home.  - Volume status stable on lasix 20 mg daily.  - Continue entresto 24-26 mg BID.  - Continue spironolactone 25 mg qhs.   - No bb for now.   2. S/P CABG x1 MVR - bioprosthetic valve and LA clipping.  - Stable.  3. Shock Liver versus Passive Congestion - LFTs improved. No change.  4  A fib RVR -Maintaining NSR.  INR 1.38. Management per TCTS. - No bridging with bioprosth valve and no h/o CVA/TIA 5. Hypokalemia - K 4.1. Continue to follow.  6. Hypomagnesium  - Magnesium 1.2 today. Will give 4 g IV.  - Increase po dose to 400 mg BID.  7. Post op anemia-   - No overt bleeding. Continue to follow.  8. Deconditioning - HHPT recommended.    Co-ox 48%. Repeat pending. Will await final decision based on repeat.     Length of Stay: [place], [place]  05/08/2017, 10:15 AM  Advanced Heart Failure Team Pager [phone] (M-F; 7a - 4p)  Please contact [facility] for night-coverage after hours (4p -7a ) and weekends on [email or web address]  Patient seen and examined with the above-signed Advanced Practice Provider and/or Housestaff. I personally reviewed laboratory data, imaging studies and relevant notes. I independently examined the patient and formulated the important aspects of the plan. I have edited the note to reflect any of my changes or salient points. I have personally discussed the plan with the patient and/or family.  She feels good. Maintaining NSR. Volume status ok.  Co-ox low  this am but now coming back up. Will d/c home today and follow closely. Ok to remove PICC.   Glori Bickers, MD  3:01 PM

## 2017-05-08 NOTE — Progress Notes (Signed)
Order received to discharge patient.  PICC line removed by IV team.  Telemetry monitor removed and CCMD notified.  Discharge instructions, follow up, medications and instructions for their use discussed with patient.

## 2017-05-09 ENCOUNTER — Telehealth (HOSPITAL_COMMUNITY): Payer: Self-pay

## 2017-05-09 NOTE — Telephone Encounter (Signed)
Patient insurance is active and benefits verified. Patient insurance is [facility] Medicare - $20.00 co-payment, no deductible, out of pocket $4500/$1313.57 has been met, no co-insurance and no pre-authorization. Passport/reference [phone].  Patient will be contacted and scheduled after their follow up appointment with the cardiologist on 05/15/17 and 05/28/17, upon review by [facility] RN navigator.

## 2017-05-10 ENCOUNTER — Ambulatory Visit (INDEPENDENT_AMBULATORY_CARE_PROVIDER_SITE_OTHER): Payer: Medicare Other | Admitting: *Deleted

## 2017-05-10 ENCOUNTER — Telehealth: Payer: Self-pay

## 2017-05-10 DIAGNOSIS — Z7901 Long term (current) use of anticoagulants: Secondary | ICD-10-CM

## 2017-05-10 DIAGNOSIS — I4891 Unspecified atrial fibrillation: Secondary | ICD-10-CM | POA: Diagnosis not present

## 2017-05-10 DIAGNOSIS — I059 Rheumatic mitral valve disease, unspecified: Secondary | ICD-10-CM | POA: Diagnosis not present

## 2017-05-10 DIAGNOSIS — I4819 Other persistent atrial fibrillation: Secondary | ICD-10-CM | POA: Insufficient documentation

## 2017-05-10 DIAGNOSIS — Z953 Presence of xenogenic heart valve: Secondary | ICD-10-CM | POA: Diagnosis not present

## 2017-05-10 LAB — POCT INR: INR: 1.7

## 2017-05-10 NOTE — Telephone Encounter (Signed)
[place] @ [facility] states they received a referral for home health. They will be out to see pt on 05/14/17. Just FYI.  Dr. Elease Hashimoto - FYI. Thanks!

## 2017-05-10 NOTE — Patient Instructions (Signed)

## 2017-05-14 ENCOUNTER — Ambulatory Visit (INDEPENDENT_AMBULATORY_CARE_PROVIDER_SITE_OTHER): Payer: Medicare Other | Admitting: Family Medicine

## 2017-05-14 ENCOUNTER — Encounter: Payer: Self-pay | Admitting: Family Medicine

## 2017-05-14 ENCOUNTER — Other Ambulatory Visit: Payer: Self-pay | Admitting: *Deleted

## 2017-05-14 VITALS — BP 90/60 | HR 60 | Temp 97.7°F | Wt 120.0 lb

## 2017-05-14 DIAGNOSIS — I4891 Unspecified atrial fibrillation: Secondary | ICD-10-CM | POA: Diagnosis not present

## 2017-05-14 DIAGNOSIS — I10 Essential (primary) hypertension: Secondary | ICD-10-CM

## 2017-05-14 DIAGNOSIS — E119 Type 2 diabetes mellitus without complications: Secondary | ICD-10-CM

## 2017-05-14 DIAGNOSIS — D649 Anemia, unspecified: Secondary | ICD-10-CM

## 2017-05-14 DIAGNOSIS — R790 Abnormal level of blood mineral: Secondary | ICD-10-CM | POA: Diagnosis not present

## 2017-05-14 NOTE — Progress Notes (Signed)
Subjective:     Patient ID: [name], female   DOB: 12-Jan-1934, 83 y.o.   MRN: [id number]  HPI Patient seen for hospital follow-up following recent heart surgery. She has history of severe mitral regurgitation and single-vessel CAD. She was admitted on 04/23/17 for mitral valve replacement and coronary artery bypass grafting 1. She was discharged home 05/08/17. Her postoperative course was complicated by acute combined biventricular heart failure. She had post op anemia and d/c hgb 10.4. She had atrial fibrillation and was placed on amiodarone drip and then transitioned to oral amiodarone.  Blood sugars remained stable during her hospitalization she was discharged on metformin. She apparently converted to sinus rhythm prior to discharge. She had low magnesium of 1.2 on discharge and was given oral magnesium which she continues to take. She has pending follow-up with heart failure clinic. Her weight has actually been declining and is down about 3 pounds since discharge. No leg edema. She has poor appetite. Sugars been very stable. She had low sodium 130 at discharge.  In reviewing her medications, she initially stated that she is still taking lisinopril HCTZ but she cannot be certain and there was some confusion whether she is still taking that. She's also now on several heart failure medications including Entresto, Aldactone, and furosemide 20 mg daily. She is on Coumadin.  Feels poorly overall. She has some fatigue. Poor appetite as above. No chest pain. No dysuria. No fevers or chills.  Past Medical History:  Diagnosis Date  . Allergy   . Anxiety   . Arthritis   . Atrial enlargement, left   . CHF (congestive heart failure) ([place])   . Coronary artery disease   . Depression   . Diabetes mellitus without complication (HCC)    fasting cbg 110s  . Dyspnea   . GERD (gastroesophageal reflux disease)   . H/O hiatal hernia   . Headache(784.0)   . Heart murmur    06/10/2014 seeing new  cardiologist  . Heart murmur   . HOH (hard of hearing)    HOH in left ear; needs to speak to patient in right ear  . Hyperlipidemia   . Hypertension   . Mitral regurgitation   . Mitral valve prolapse   . Palpitations    afib on 03/04/17 EKG  . PONV (postoperative nausea and vomiting)    Patient stated "I like the patch behind my ear"  . 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 S/P mitral valve replacement with bioprosthetic valve 04/23/2017   [facility] Mitral bovine pericardial tissue valve: Model 7300TFX, Serial [id number], size 31  . UTI (lower urinary tract infection)    Past Surgical History:  Procedure Laterality Date  . ABDOMINAL HYSTERECTOMY    . Anterior posterior and enterocele repairs  09/21/2004   With uterosacral cardinal colposuspension, partial colpocleisis; Selinda Orion, MD  . BACK SURGERY    . BREAST ENHANCEMENT SURGERY  02/25/2002   Bilateral reduction and excision of accessory breast tissue underneath the left breast; Aretha Parrot., MD  . BREAST SURGERY  11   reduction  . CARDIAC CATHETERIZATION     no PCI  . CARDIAC CATHETERIZATION  05/02/2017  . CATARACT EXTRACTION Bilateral   . CLIPPING OF ATRIAL APPENDAGE  04/23/2017   Procedure: CLIPPING OF LEFT ATRIAL APPENDAGE;  Surgeon: [name], MD;  Location: [place];  Service: Open Heart Surgery;;  . COLONOSCOPY    .  CORONARY ARTERY BYPASS GRAFT N/A 04/23/2017   Procedure: CORONARY ARTERY BYPASS GRAFTING (CABG), ON PUMP, TIMES ONE, USING ENDOSCOPICALLY HARVESTED RIGHT GREATER SAPHENOUS VEIN;  Surgeon: [name], MD;  Location: [name];  Service: Open Heart Surgery;  Laterality: N/A;  . EYE SURGERY Bilateral    cataract removal  . LUMBAR LAMINECTOMY/ DECOMPRESSION WITH MET-RX N/A 03/16/2014   Procedure: LUMBAR FIVE-SACRAL ONE EXTRAFORAMINAL DISKECTOMY WITH METREX;  Surgeon: Kristeen Miss, MD;  Location: [place] NEURO ORS;  Service: Neurosurgery;  Laterality: N/A;  . LUMBAR  LAMINECTOMY/DECOMPRESSION MICRODISCECTOMY Left 06/08/2014   Procedure: Left Lumbar Five-Sacral One Microdiskectomy;  Surgeon: Kristeen Miss, MD;  Location: [place] NEURO ORS;  Service: Neurosurgery;  Laterality: Left;  Left L5-S1 Microdiskectomy  . MITRAL VALVE REPLACEMENT N/A 04/23/2017   Procedure: MITRAL VALVE (MV) REPLACEMENT;  Surgeon: [name], MD;  Location: [place];  Service: Open Heart Surgery;  Laterality: N/A;  . PELVIC FLOOR REPAIR    . POSTERIOR LUMBAR FUSION 4 LEVEL N/A 08/08/2015   Procedure: T8-L1 posterior lateral fusion with decompression T12-L1;  Surgeon: Kristeen Miss, MD;  Location: [place] NEURO ORS;  Service: Neurosurgery;  Laterality: N/A;  T8-L1 posterior lateral fusion with decompression T12-L1  . RIGHT/LEFT HEART CATH AND CORONARY ANGIOGRAPHY N/A 03/28/2017   Procedure: Right/Left Heart Cath and Coronary Angiography;  Surgeon: Nelva Bush, MD;  Location: [place] CV LAB;  Service: Cardiovascular;  Laterality: N/A;  . ROTATOR CUFF REPAIR Bilateral    11.12  . TEE WITHOUT CARDIOVERSION N/A 03/04/2017   Procedure: TRANSESOPHAGEAL ECHOCARDIOGRAM (TEE);  Surgeon: Fay Records, MD;  Location: [place];  Service: Cardiovascular;  Laterality: N/A;  . TEE WITHOUT CARDIOVERSION N/A 04/23/2017   Procedure: TRANSESOPHAGEAL ECHOCARDIOGRAM (TEE);  Surgeon: [name], MD;  Location: [place];  Service: Open Heart Surgery;  Laterality: N/A;  . TONSILLECTOMY AND ADENOIDECTOMY      reports that she has never smoked. She has never used smokeless tobacco. She reports that she does not drink alcohol or use drugs. family history includes Arthritis in her father and mother; Heart disease in her father; Heart failure in her father; Hyperlipidemia in her father and mother; Hypertension in her mother. Allergies  Allergen Reactions  . Cortisone Other (See Comments)    Insomnia, heart palpitations (po only)  . Statins Other (See Comments)    Myalgias   . Codeine Swelling    FACIAL  SWELLING SEVERITY UNKNOWN  . Sulfonamide Derivatives     UNSPECIFIED REACTION   . Lactose Intolerance (Gi) Nausea And Vomiting     Review of Systems  Constitutional: Positive for appetite change and fatigue. Negative for chills and fever.  Respiratory: Negative for cough and shortness of breath.   Cardiovascular: Negative for chest pain and leg swelling.  Gastrointestinal: Negative for abdominal pain, diarrhea and vomiting.  Genitourinary: Negative for dysuria.  Neurological: Positive for weakness.  Psychiatric/Behavioral: Negative for confusion.       Objective:   Physical Exam  Constitutional: She is oriented to person, place, and time. She appears well-developed and well-nourished.  Cardiovascular:  Patient has irregularly irregular rhythm with rate between 76 and 80 by my palpation  Pulmonary/Chest: Effort normal and breath sounds normal. No respiratory distress. She has no wheezes.  Musculoskeletal: She exhibits no edema.  Neurological: She is alert and oriented to person, place, and time.       Assessment:     #1 recent heart surgery for mitral replacement and single-vessel CAD  #2 biventricular heart failure, acute  #  3 atrial fibrillation. She apparently converted during her hospitalization back to sinus rhythm but appears to be back in A. fib at this time but rate controlled  #4 hyponatremia  #5 low magnesium  #6 postoperative anemia  #7 type 2 diabetes which has been well controlled    Plan:     -Recheck labs today with CBC, comprehensive metabolic panel, TSH, magnesium level -We have instructed patient and her husband to make sure she is not taking lisinopril HCTZ. There been some initial confusion as above whether she was taking this. -Continue follow-up with heart failure clinic tomorrow as scheduled -We discussed flu vaccination and she wishes to wait until later in the month -repeat A1C at follow up.  Eulas Post MD [facility]  [facility]

## 2017-05-14 NOTE — Patient Instructions (Signed)
Stop the Lisinopril/HCTZ at this time Be sure to keep follow up with heart failure clinic tomorrow.

## 2017-05-14 NOTE — Telephone Encounter (Signed)
OK 

## 2017-05-14 NOTE — Patient Outreach (Signed)
[place] [facility] [facility]) Care Management  05/14/2017  [name] 12/13/1933 [id number]  Referral from [facility]- General Discharge/Red Alert-Day#4, 05/13/2017: Reason: Know who to call about changes in condition-No:  Telephone call to patient who was advised of reason for call. Hippa verification received from patient.  Patient voices that she answered question-yes. States she knows who to call if she has changes in her condition. States she had follow up appointment today with primary care provider. Voices that she also has appointment scheduled with specialist. States she has all of medications & has no problems getting to MD appointments.   Voices that she is currently tired & wishes to end conversation & rest. States no health concerns at this time.   EMMI-call has been addressed.  Plan: Send to care management assistant to close case.   Sherrin Daisy, RN BSN [place] Management Coordinator [facility] Care Management  [phone]

## 2017-05-14 NOTE — Telephone Encounter (Signed)
fyi [name] is calling Pt is requesting starter care on 05-16-17

## 2017-05-15 ENCOUNTER — Encounter (HOSPITAL_COMMUNITY): Payer: Self-pay

## 2017-05-15 ENCOUNTER — Ambulatory Visit (HOSPITAL_BASED_OUTPATIENT_CLINIC_OR_DEPARTMENT_OTHER)
Admit: 2017-05-15 | Discharge: 2017-05-15 | Disposition: A | Discharge status: Home / Self Care | Payer: Medicare Other | Attending: Internal Medicine | Admitting: Internal Medicine

## 2017-05-15 ENCOUNTER — Ambulatory Visit (INDEPENDENT_AMBULATORY_CARE_PROVIDER_SITE_OTHER): Payer: Medicare Other | Admitting: *Deleted

## 2017-05-15 VITALS — BP 116/78 | HR 105 | Wt 121.0 lb

## 2017-05-15 DIAGNOSIS — R079 Chest pain, unspecified: Secondary | ICD-10-CM | POA: Diagnosis not present

## 2017-05-15 DIAGNOSIS — R0789 Other chest pain: Secondary | ICD-10-CM | POA: Diagnosis not present

## 2017-05-15 DIAGNOSIS — E878 Other disorders of electrolyte and fluid balance, not elsewhere classified: Secondary | ICD-10-CM

## 2017-05-15 DIAGNOSIS — Z7982 Long term (current) use of aspirin: Secondary | ICD-10-CM

## 2017-05-15 DIAGNOSIS — E785 Hyperlipidemia, unspecified: Secondary | ICD-10-CM

## 2017-05-15 DIAGNOSIS — E875 Hyperkalemia: Secondary | ICD-10-CM | POA: Diagnosis not present

## 2017-05-15 DIAGNOSIS — I248 Other forms of acute ischemic heart disease: Secondary | ICD-10-CM | POA: Diagnosis not present

## 2017-05-15 DIAGNOSIS — Z8249 Family history of ischemic heart disease and other diseases of the circulatory system: Secondary | ICD-10-CM | POA: Insufficient documentation

## 2017-05-15 DIAGNOSIS — F329 Major depressive disorder, single episode, unspecified: Secondary | ICD-10-CM

## 2017-05-15 DIAGNOSIS — I480 Paroxysmal atrial fibrillation: Secondary | ICD-10-CM

## 2017-05-15 DIAGNOSIS — R7611 Nonspecific reaction to tuberculin skin test without active tuberculosis: Secondary | ICD-10-CM

## 2017-05-15 DIAGNOSIS — I059 Rheumatic mitral valve disease, unspecified: Secondary | ICD-10-CM

## 2017-05-15 DIAGNOSIS — Z888 Allergy status to other drugs, medicaments and biological substances status: Secondary | ICD-10-CM | POA: Insufficient documentation

## 2017-05-15 DIAGNOSIS — E871 Hypo-osmolality and hyponatremia: Secondary | ICD-10-CM | POA: Diagnosis not present

## 2017-05-15 DIAGNOSIS — I4891 Unspecified atrial fibrillation: Secondary | ICD-10-CM

## 2017-05-15 DIAGNOSIS — F419 Anxiety disorder, unspecified: Secondary | ICD-10-CM | POA: Insufficient documentation

## 2017-05-15 DIAGNOSIS — I10 Essential (primary) hypertension: Secondary | ICD-10-CM | POA: Diagnosis not present

## 2017-05-15 DIAGNOSIS — N179 Acute kidney failure, unspecified: Secondary | ICD-10-CM | POA: Diagnosis not present

## 2017-05-15 DIAGNOSIS — I5042 Chronic combined systolic (congestive) and diastolic (congestive) heart failure: Secondary | ICD-10-CM

## 2017-05-15 DIAGNOSIS — I509 Heart failure, unspecified: Secondary | ICD-10-CM

## 2017-05-15 DIAGNOSIS — Z882 Allergy status to sulfonamides status: Secondary | ICD-10-CM | POA: Insufficient documentation

## 2017-05-15 DIAGNOSIS — Z79899 Other long term (current) drug therapy: Secondary | ICD-10-CM | POA: Insufficient documentation

## 2017-05-15 DIAGNOSIS — I5023 Acute on chronic systolic (congestive) heart failure: Secondary | ICD-10-CM | POA: Diagnosis not present

## 2017-05-15 DIAGNOSIS — Z951 Presence of aortocoronary bypass graft: Secondary | ICD-10-CM

## 2017-05-15 DIAGNOSIS — I341 Nonrheumatic mitral (valve) prolapse: Secondary | ICD-10-CM | POA: Insufficient documentation

## 2017-05-15 DIAGNOSIS — Z953 Presence of xenogenic heart valve: Secondary | ICD-10-CM | POA: Diagnosis not present

## 2017-05-15 DIAGNOSIS — I340 Nonrheumatic mitral (valve) insufficiency: Secondary | ICD-10-CM

## 2017-05-15 DIAGNOSIS — I4581 Long QT syndrome: Secondary | ICD-10-CM | POA: Diagnosis not present

## 2017-05-15 DIAGNOSIS — E860 Dehydration: Secondary | ICD-10-CM | POA: Diagnosis not present

## 2017-05-15 DIAGNOSIS — Z7984 Long term (current) use of oral hypoglycemic drugs: Secondary | ICD-10-CM | POA: Insufficient documentation

## 2017-05-15 DIAGNOSIS — Z7901 Long term (current) use of anticoagulants: Secondary | ICD-10-CM

## 2017-05-15 DIAGNOSIS — R7989 Other specified abnormal findings of blood chemistry: Secondary | ICD-10-CM | POA: Diagnosis not present

## 2017-05-15 DIAGNOSIS — Z8261 Family history of arthritis: Secondary | ICD-10-CM | POA: Insufficient documentation

## 2017-05-15 DIAGNOSIS — J189 Pneumonia, unspecified organism: Secondary | ICD-10-CM | POA: Diagnosis not present

## 2017-05-15 DIAGNOSIS — I2510 Atherosclerotic heart disease of native coronary artery without angina pectoris: Secondary | ICD-10-CM

## 2017-05-15 DIAGNOSIS — M419 Scoliosis, unspecified: Secondary | ICD-10-CM | POA: Insufficient documentation

## 2017-05-15 DIAGNOSIS — K7200 Acute and subacute hepatic failure without coma: Secondary | ICD-10-CM

## 2017-05-15 DIAGNOSIS — K219 Gastro-esophageal reflux disease without esophagitis: Secondary | ICD-10-CM

## 2017-05-15 DIAGNOSIS — I110 Hypertensive heart disease with heart failure: Secondary | ICD-10-CM

## 2017-05-15 DIAGNOSIS — Z66 Do not resuscitate: Secondary | ICD-10-CM | POA: Diagnosis not present

## 2017-05-15 DIAGNOSIS — K449 Diaphragmatic hernia without obstruction or gangrene: Secondary | ICD-10-CM | POA: Insufficient documentation

## 2017-05-15 DIAGNOSIS — I481 Persistent atrial fibrillation: Secondary | ICD-10-CM | POA: Diagnosis not present

## 2017-05-15 DIAGNOSIS — E119 Type 2 diabetes mellitus without complications: Secondary | ICD-10-CM | POA: Insufficient documentation

## 2017-05-15 LAB — CBC WITH DIFFERENTIAL/PLATELET
BASOS PCT: 0.6 % (ref 0.0–3.0)
Basophils Absolute: 0 10*3/uL (ref 0.0–0.1)
EOS PCT: 3.1 % (ref 0.0–5.0)
Eosinophils Absolute: 0.2 10*3/uL (ref 0.0–0.7)
HEMATOCRIT: 36.2 % (ref 36.0–46.0)
Hemoglobin: 11.8 g/dL — ABNORMAL LOW (ref 12.0–15.0)
LYMPHS ABS: 1 10*3/uL (ref 0.7–4.0)
Lymphocytes Relative: 14.9 % (ref 12.0–46.0)
MCHC: 32.7 g/dL (ref 30.0–36.0)
MCV: 95.6 fl (ref 78.0–100.0)
MONOS PCT: 7.1 % (ref 3.0–12.0)
Monocytes Absolute: 0.5 10*3/uL (ref 0.1–1.0)
NEUTROS ABS: 4.7 10*3/uL (ref 1.4–7.7)
NEUTROS PCT: 74.3 % (ref 43.0–77.0)
PLATELETS: 514 10*3/uL — AB (ref 150.0–400.0)
RBC: 3.79 Mil/uL — ABNORMAL LOW (ref 3.87–5.11)
RDW: 15.9 % — AB (ref 11.5–15.5)
WBC: 6.4 10*3/uL (ref 4.0–10.5)

## 2017-05-15 LAB — TSH: TSH: 2.45 u[IU]/mL (ref 0.35–4.50)

## 2017-05-15 LAB — COMPREHENSIVE METABOLIC PANEL
ALT: 39 U/L — AB (ref 0–35)
AST: 15 U/L (ref 0–37)
Albumin: 3.7 g/dL (ref 3.5–5.2)
Alkaline Phosphatase: 131 U/L — ABNORMAL HIGH (ref 39–117)
BILIRUBIN TOTAL: 0.3 mg/dL (ref 0.2–1.2)
BUN: 19 mg/dL (ref 6–23)
CALCIUM: 9.3 mg/dL (ref 8.4–10.5)
CO2: 21 meq/L (ref 19–32)
Chloride: 94 mEq/L — ABNORMAL LOW (ref 96–112)
Creatinine, Ser: 1.17 mg/dL (ref 0.40–1.20)
GFR: 47.13 mL/min — AB (ref >60.00)
GLUCOSE: 173 mg/dL — AB (ref 70–99)
POTASSIUM: 5.4 meq/L — AB (ref 3.5–5.1)
Sodium: 129 mEq/L — ABNORMAL LOW (ref 135–145)
Total Protein: 6.9 g/dL (ref 6.0–8.3)

## 2017-05-15 LAB — POCT INR: INR: 2.8

## 2017-05-15 LAB — MAGNESIUM: Magnesium: 1.5 mg/dL (ref 1.5–2.5)

## 2017-05-15 MED ORDER — AMIODARONE HCL 400 MG PO TABS: 400.0000 mg | ORAL_TABLET | Freq: Two times a day (BID) | ORAL | 6 refills | Status: DC

## 2017-05-15 MED ORDER — SPIRONOLACTONE 25 MG PO TABS: 12.5000 mg | ORAL_TABLET | Freq: Every day | ORAL | 3 refills | Status: DC

## 2017-05-15 NOTE — Telephone Encounter (Signed)
Spoke with Darlina Guys at [facility], verbal given for starter care for home health. Nothing further needed.

## 2017-05-15 NOTE — Progress Notes (Signed)
Advanced Heart Failure Clinic Note   Primary Cardiologist: Dr. Harrington Challenger Primary HF: Dr. Haroldine Laws   HPI:  [name] is a 83 y.o. female with h/o MVP, HTN, DM2, scoliosis, and CAD. She had developed DOE over the past 2-3 months along with occasional peripheral edema. TEE 03/04/17 showed severe MR and severe LAE. L/RHC 03/28/17 with severe multivessel disease. Pt was referred for surgical consultation by Dr. Roxy Manns.   Pt underwent CABG x 1 and MVR replacement 04/23/17. Milrinone added 04/27/17 with Mixed venous saturation of 33%. HF team consulted for assistance. Pressures support adjusted and weaned as tolerated. Course complicated by Afib. Started on IV amiodarone and pt chemically converted to NSR overnight into 05/05/17.  Hospital course complicated by depressed coox in lower 50% range off milrinone. Pt continued to improve symptomatically and was thought to be a poor candidate for home milrinone with recent valve surgery and increase risk of infection. Coox 53.5% on day of discharge. Sent home without PICC with close follow up. Discharge weight 120 lbs  She presents today for post hospital follow up. Had been feeling much better. Walking 140 - 200 feet at home with a cane up until yesterday. Since then has felt more fatigued and shaky since then. No SOB getting around the house. Denies lightheadedness or dizziness. No CP. Denies swelling. Weight at home 115 lbs this am. Appetite has been improving since discharge. Taking all medication as directed.    Echo 04/26/17 LVEF 25-30%, Stable MVR, Moderate TR, Peak PA pressure 38 mm, RV moderately reduced function.  Echo TEE 03/04/17 LVEF normal, Mild MVP with regurgitant jets, predominant jet posteriorly, Severe LAE.  R/LHC 03/28/17  1. Severe single-vessel coronary artery disease with 60% proximal and 99% distal RCA stenoses as well as chronic total occlusion of the ostial rPDA. The rPDA andr PL branches are supplied by left-to-right collaterals. 2. 70%  proximal D2 stenosis is evident. Otherwise, there is mild, nonobstructive disease involving the LAD, LCx, and their branches. 3. Normal left and right heart filling pressures. Normal pulmonary artery pressure. Normal Fick cardiac output/index. RHC Procedural Findings: Hemodynamics (mmHg) RA mean 4 RV 30/2 PA 28/8 PCWP 7 AO 107/45 Cardiac Output (Fick) 4.67 Cardiac Index (Fick) 2.78  Review of systems complete and found to be negative unless listed in HPI.    Past Medical History:  Diagnosis Date  . Allergy   . Anxiety   . Arthritis   . Atrial enlargement, left   . CHF (congestive heart failure) ([place])   . Coronary artery disease   . Depression   . Diabetes mellitus without complication (HCC)    fasting cbg 110s  . Dyspnea   . GERD (gastroesophageal reflux disease)   . H/O hiatal hernia   . Headache(784.0)   . Heart murmur    06/10/2014 seeing new cardiologist  . Heart murmur   . HOH (hard of hearing)    HOH in left ear; needs to speak to patient in right ear  . Hyperlipidemia   . Hypertension   . Mitral regurgitation   . Mitral valve prolapse   . Palpitations    afib on 03/04/17 EKG  . PONV (postoperative nausea and vomiting)    Patient stated "I like the patch behind my ear"  . 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 S/P mitral valve replacement with bioprosthetic valve 04/23/2017   [facility] Mitral bovine pericardial tissue valve: Model 7300TFX, Serial  [id number], size 31  . UTI (lower urinary tract infection)     Current Outpatient Prescriptions  Medication Sig Dispense Refill  . amiodarone (PACERONE) 200 MG tablet Take 1 tablet (200 mg total) by mouth 2 (two) times daily. For one week then take 200 mg by mouth daily thereafter. 60 tablet 1  . aspirin EC 81 MG EC tablet Take 1 tablet (81 mg total) by mouth daily.    . bisacodyl (DULCOLAX) 5 MG EC tablet Take 10 mg by mouth daily as needed (for constipation.).    [name] citalopram (CELEXA)  20 MG tablet Take 1 tablet (20 mg total) by mouth daily. 90 tablet 3  . clonazePAM (KLONOPIN) 0.5 MG tablet Take one tablet by mouth PRN insomnia or anxiety. 20 tablet 0  . conjugated estrogens (PREMARIN) vaginal cream Place 1 Applicatorful vaginally daily. (Patient taking differently: Place 1 Applicatorful vaginally every 14 (fourteen) days. ) 42.5 g 12  . esomeprazole (NEXIUM) 20 MG capsule Take 20 mg by mouth daily at 12 noon.    . famotidine (PEPCID) 20 MG tablet Take 20 mg by mouth daily as needed for heartburn or indigestion.    . fexofenadine (ALLEGRA) 180 MG tablet Take 90 mg by mouth daily as needed for allergies or rhinitis (allergy headache).    . fluticasone (FLONASE) 50 MCG/ACT nasal spray SHAKE LIQUID AND USE 2 SPRAYS IN EACH NOSTRIL DAILY AS NEEDED FOR ALLERGIES 16 g 2  . furosemide (LASIX) 20 MG tablet Take 1 tablet (20 mg total) by mouth daily. 30 tablet 1  . HYDROcodone-acetaminophen (NORCO/VICODIN) 5-325 MG tablet Take 0.5 tablets by mouth every 6 (six) hours as needed for severe pain. 20 tablet 0  . ibuprofen (ADVIL,MOTRIN) 200 MG tablet Take 400 mg by mouth daily as needed for headache or mild pain.     . magnesium oxide (MAG-OX) 400 (241.3 Mg) MG tablet Take 1 tablet (400 mg total) by mouth 2 (two) times daily. 60 tablet 0  . metFORMIN (GLUCOPHAGE) 500 MG tablet Take 2 tablets (1,000 mg total) by mouth 2 (two) times daily with a meal. 360 tablet 3  . Multiple Vitamins-Minerals (ICAPS AREDS 2) CAPS Take 1 capsule by mouth 2 (two) times daily.    Glory Rosebush DELICA LANCETS 66Y MISC USE TO TEST TWICE DAILY AS DIRECTED 100 each 0  . ONETOUCH VERIO test strip USE TO CHECK BLOOD GLUCOSE TWICE DAILY AS DIRECTED 100 each 3  . Probiotic Product (PROBIOTIC DAILY PO) Take 2 capsules by mouth daily.     . sacubitril-valsartan (ENTRESTO) 24-26 MG Take 1 tablet by mouth 2 (two) times daily. 60 tablet 1  . spironolactone (ALDACTONE) 25 MG tablet Take 1 tablet (25 mg total) by mouth at bedtime.  30 tablet 1  . trimethoprim (TRIMPEX) 100 MG tablet TAKE 1 TABLET(100 MG) BY MOUTH DAILY (Patient taking differently: TAKE 1 TABLET(100 MG) BY MOUTH AT BEDTIME) 90 tablet 1  . warfarin (COUMADIN) 2.5 MG tablet Take 1 tablet (2.5 mg total) by mouth daily. Or as directed. 30 tablet 1   No current facility-administered medications for this encounter.     Allergies  Allergen Reactions  . Cortisone Other (See Comments)    Insomnia, heart palpitations (po only)  . Statins Other (See Comments)    Myalgias   . Codeine Swelling    FACIAL SWELLING SEVERITY UNKNOWN  . Sulfonamide Derivatives     UNSPECIFIED REACTION   . Lactose Intolerance (Gi) Nausea And Vomiting      Social  History   Social History  . Marital status: Married    Spouse name: N/A  . Number of children: N/A  . Years of education: N/A   Occupational History  . Retired Retired   Social History Main Topics  . Smoking status: Never Smoker  . Smokeless tobacco: Never Used  . Alcohol use No  . Drug use: No  . Sexual activity: Not on file   Other Topics Concern  . Not on file   Social History Narrative   Married   Regular exercise      Family History  Problem Relation Age of Onset  . Heart failure Father   . Arthritis Father   . Hyperlipidemia Father   . Heart disease Father   . Arthritis Mother   . Hyperlipidemia Mother   . Hypertension Mother     Vitals:   05/15/17 1431  BP: 116/78  Pulse: (!) 105  SpO2: 95%  Weight: 121 lb (54.9 kg)   Wt Readings from Last 3 Encounters:  05/15/17 121 lb (54.9 kg)  05/14/17 120 lb (54.4 kg)  05/08/17 123 lb 4.8 oz (55.9 kg)   PHYSICAL EXAM: General:  Elderly and fatigued appearing.  No respiratory difficulty HEENT: normal Neck: supple. JVP 7-8 cm at least. Carotids 2+ bilat; no bruits. No lymphadenopathy or thyromegaly appreciated. Cor: PMI nondisplaced. Irregularly irregular. Tachy. No rubs, gallops or murmurs. Lungs: Clear Abdomen: soft, nontender,  nondistended. No hepatosplenomegaly. No bruits or masses. Good bowel sounds. Extremities: no cyanosis, clubbing, rash, edema Neuro: alert & oriented x 3, cranial nerves grossly intact. moves all 4 extremities w/o difficulty. Affect pleasant.  ECG: Afib RVR 114 bpm, Personally reviewed  ASSESSMENT & PLAN:  1. Chronic Combined Biventricular HF- EF dropped post op-->EF 25-30% with RV dysfunction.  - NYHA II-III symptoms. - Volume status remains mildly elevated on lasix 20 mg daily. Will focus on afib.  - Continue entresto 24-26 mg BID.  - Decrease spironolactone to 12.5 mg qhs.  - Avoiding BB for now.  - Will plan repeat echo in 2-3 months after med adjustment.  2. CAD s/p CABG x1 04/23/17  - Denies CP.  - Continue ASA and statin.  3. MVR - bioprosthetic valve and LA clipping.  - Stable on post op echo.  4. Paroxysmal  A fib  - EKG today shows Afib RVR.  - Increase Amiodarone back to 400 mg BID.  - Coumadin per coumadin clinic. Denies bleeding.  5. Shock Liver - Resolved. LFTs stable 05/14/17 6. Electrolyte imbalance - Goal K > 4.0 and Mg > 2.0  - K 5.4 yesterday at PCP. Mg 1.4.  - Cut back spiro as above.   Tenuous. Now back in Afib.  Will increase amiodarone and see back on Monday. If remains in Afib will plan DCCV on Tuesday. Instructed patient to return to ED with any worsening symptoms.   Shirley Friar, PA-C 05/15/17   Patient seen and examined with the above-signed Advanced Practice Provider and/or Housestaff. I personally reviewed laboratory data, imaging studies and relevant notes. I independently examined the patient and formulated the important aspects of the plan. I have edited the note to reflect any of my changes or salient points. I have personally discussed the plan with the patient and/or family.  She is back in AF and feeling worse. Agree with doubling amiodarone and seeing her back Monday. If not back in NSR will likely need DC-CV. If feeling well worse over  the weekend will need to  go to ED.   Glori Bickers, MD  10:34 PM

## 2017-05-15 NOTE — Patient Instructions (Signed)
DECREASE Spironolactone to 12.5 mg (1/2 tablet) once daily.  INCREASE Amiodarone to 400 mg twice daily. Can double up on your current 200 mg tablets you have at home (Take 2 tabs twice daily). New Rx has been sent for 400 mg tablets (Take 1 tab twice daily).  Follow up Monday September 10th at __________________ [name] Code: 7002  Take all medication as prescribed the day of your appointment. Bring all medications with you to your appointment.  Do the following things EVERYDAY: 1) Weigh yourself in the morning before breakfast. Write it down and keep it in a log. 2) Take your medicines as prescribed 3) Eat low salt foods-Limit salt (sodium) to 2000 mg per day.  4) Stay as active as you can everyday 5) Limit all fluids for the day to less than 2 liters

## 2017-05-16 ENCOUNTER — Telehealth: Payer: Self-pay | Admitting: Family Medicine

## 2017-05-16 NOTE — Telephone Encounter (Signed)
Melissa with [facility] states they have been trying to get pt scheduled for home health PT, but pt has refused.  Pt did finally agree to 9/12.  So they hope she will let them come.

## 2017-05-18 ENCOUNTER — Encounter (HOSPITAL_COMMUNITY): Payer: Self-pay | Admitting: Emergency Medicine

## 2017-05-18 ENCOUNTER — Inpatient Hospital Stay (HOSPITAL_COMMUNITY)
Admission: EM | Admit: 2017-05-18 | Discharge: 2017-05-22 | DRG: 682 | Disposition: A | Discharge status: Home Health | Payer: Medicare Other | Attending: Cardiovascular Disease | Admitting: Cardiovascular Disease

## 2017-05-18 ENCOUNTER — Emergency Department (HOSPITAL_COMMUNITY): Payer: Medicare Other

## 2017-05-18 DIAGNOSIS — I110 Hypertensive heart disease with heart failure: Secondary | ICD-10-CM | POA: Diagnosis present

## 2017-05-18 DIAGNOSIS — R0789 Other chest pain: Secondary | ICD-10-CM

## 2017-05-18 DIAGNOSIS — J189 Pneumonia, unspecified organism: Secondary | ICD-10-CM

## 2017-05-18 DIAGNOSIS — E1169 Type 2 diabetes mellitus with other specified complication: Secondary | ICD-10-CM

## 2017-05-18 DIAGNOSIS — I2510 Atherosclerotic heart disease of native coronary artery without angina pectoris: Secondary | ICD-10-CM | POA: Diagnosis present

## 2017-05-18 DIAGNOSIS — I480 Paroxysmal atrial fibrillation: Secondary | ICD-10-CM | POA: Diagnosis not present

## 2017-05-18 DIAGNOSIS — I25118 Atherosclerotic heart disease of native coronary artery with other forms of angina pectoris: Secondary | ICD-10-CM | POA: Diagnosis not present

## 2017-05-18 DIAGNOSIS — Z953 Presence of xenogenic heart valve: Secondary | ICD-10-CM

## 2017-05-18 DIAGNOSIS — I248 Other forms of acute ischemic heart disease: Secondary | ICD-10-CM | POA: Diagnosis not present

## 2017-05-18 DIAGNOSIS — R778 Other specified abnormalities of plasma proteins: Secondary | ICD-10-CM

## 2017-05-18 DIAGNOSIS — I4581 Long QT syndrome: Secondary | ICD-10-CM | POA: Diagnosis present

## 2017-05-18 DIAGNOSIS — R531 Weakness: Secondary | ICD-10-CM | POA: Diagnosis not present

## 2017-05-18 DIAGNOSIS — Z66 Do not resuscitate: Secondary | ICD-10-CM | POA: Diagnosis present

## 2017-05-18 DIAGNOSIS — I5023 Acute on chronic systolic (congestive) heart failure: Secondary | ICD-10-CM | POA: Diagnosis not present

## 2017-05-18 DIAGNOSIS — E875 Hyperkalemia: Secondary | ICD-10-CM | POA: Diagnosis present

## 2017-05-18 DIAGNOSIS — N179 Acute kidney failure, unspecified: Secondary | ICD-10-CM | POA: Diagnosis not present

## 2017-05-18 DIAGNOSIS — I498 Other specified cardiac arrhythmias: Secondary | ICD-10-CM | POA: Diagnosis present

## 2017-05-18 DIAGNOSIS — R079 Chest pain, unspecified: Secondary | ICD-10-CM | POA: Diagnosis not present

## 2017-05-18 DIAGNOSIS — E860 Dehydration: Secondary | ICD-10-CM | POA: Diagnosis present

## 2017-05-18 DIAGNOSIS — E871 Hypo-osmolality and hyponatremia: Secondary | ICD-10-CM | POA: Diagnosis present

## 2017-05-18 DIAGNOSIS — Z951 Presence of aortocoronary bypass graft: Secondary | ICD-10-CM

## 2017-05-18 DIAGNOSIS — I481 Persistent atrial fibrillation: Secondary | ICD-10-CM | POA: Diagnosis present

## 2017-05-18 DIAGNOSIS — Z90710 Acquired absence of both cervix and uterus: Secondary | ICD-10-CM

## 2017-05-18 DIAGNOSIS — R7989 Other specified abnormal findings of blood chemistry: Secondary | ICD-10-CM

## 2017-05-18 DIAGNOSIS — E119 Type 2 diabetes mellitus without complications: Secondary | ICD-10-CM | POA: Diagnosis present

## 2017-05-18 DIAGNOSIS — R748 Abnormal levels of other serum enzymes: Secondary | ICD-10-CM | POA: Diagnosis not present

## 2017-05-18 DIAGNOSIS — R1012 Left upper quadrant pain: Secondary | ICD-10-CM | POA: Diagnosis present

## 2017-05-18 DIAGNOSIS — I4891 Unspecified atrial fibrillation: Secondary | ICD-10-CM | POA: Diagnosis not present

## 2017-05-18 DIAGNOSIS — I361 Nonrheumatic tricuspid (valve) insufficiency: Secondary | ICD-10-CM | POA: Diagnosis not present

## 2017-05-18 LAB — BASIC METABOLIC PANEL
ANION GAP: 15 (ref 5–15)
BUN: 28 mg/dL — AB (ref 6–20)
CHLORIDE: 96 mmol/L — AB (ref 101–111)
CO2: 17 mmol/L — AB (ref 22–32)
Calcium: 9.3 mg/dL (ref 8.9–10.3)
Creatinine, Ser: 1.52 mg/dL — ABNORMAL HIGH (ref 0.44–1.00)
GFR calc Af Amer: 36 mL/min — ABNORMAL LOW (ref >60)
GFR, EST NON AFRICAN AMERICAN: 31 mL/min — AB (ref >60)
GLUCOSE: 192 mg/dL — AB (ref 65–99)
POTASSIUM: 5 mmol/L (ref 3.5–5.1)
Sodium: 128 mmol/L — ABNORMAL LOW (ref 135–145)

## 2017-05-18 LAB — CBC
HEMATOCRIT: 35.9 % — AB (ref 36.0–46.0)
HEMOGLOBIN: 11.6 g/dL — AB (ref 12.0–15.0)
MCH: 30 pg (ref 26.0–34.0)
MCHC: 32.3 g/dL (ref 30.0–36.0)
MCV: 92.8 fL (ref 78.0–100.0)
Platelets: 408 10*3/uL — ABNORMAL HIGH (ref 150–400)
RBC: 3.87 MIL/uL (ref 3.87–5.11)
RDW: 15.4 % (ref 11.5–15.5)
WBC: 9.3 10*3/uL (ref 4.0–10.5)

## 2017-05-18 LAB — TROPONIN I: TROPONIN I: 0.25 ng/mL — AB (ref <0.03)

## 2017-05-18 LAB — BRAIN NATRIURETIC PEPTIDE: B NATRIURETIC PEPTIDE 5: 1219.6 pg/mL — AB (ref 0.0–100.0)

## 2017-05-18 LAB — I-STAT TROPONIN, ED: Troponin i, poc: 0.17 ng/mL (ref 0.00–0.08)

## 2017-05-18 LAB — PROTIME-INR
INR: 2.69
Prothrombin Time: 28.4 seconds — ABNORMAL HIGH (ref 11.4–15.2)

## 2017-05-18 LAB — CBG MONITORING, ED: Glucose-Capillary: 139 mg/dL — ABNORMAL HIGH (ref 65–99)

## 2017-05-18 MED ORDER — TRIMETHOPRIM 100 MG PO TABS
100.0000 mg | ORAL_TABLET | Freq: Every day | ORAL | Status: DC
  Administered 2017-05-19 – 2017-05-22 (×4): 100 mg via ORAL
  Filled 2017-05-18 (×4): qty 1

## 2017-05-18 MED ORDER — PANTOPRAZOLE SODIUM 40 MG IV SOLR
40.0000 mg | Freq: Two times a day (BID) | INTRAVENOUS | Status: DC
  Administered 2017-05-19: 40 mg via INTRAVENOUS
  Filled 2017-05-18 (×2): qty 40

## 2017-05-18 MED ORDER — HYDROCODONE-ACETAMINOPHEN 5-325 MG PO TABS
0.5000 | ORAL_TABLET | Freq: Four times a day (QID) | ORAL | Status: DC | PRN
  Administered 2017-05-20: 0.5 via ORAL
  Filled 2017-05-18: qty 1

## 2017-05-18 MED ORDER — PROSIGHT PO TABS
1.0000 | ORAL_TABLET | Freq: Two times a day (BID) | ORAL | Status: DC
  Administered 2017-05-19 – 2017-05-22 (×5): 1 via ORAL
  Filled 2017-05-18 (×6): qty 1

## 2017-05-18 MED ORDER — WARFARIN SODIUM 2.5 MG PO TABS: 1.2500 mg | ORAL_TABLET | Freq: Every day | ORAL | Status: DC

## 2017-05-18 MED ORDER — SACUBITRIL-VALSARTAN 24-26 MG PO TABS
1.0000 | ORAL_TABLET | Freq: Two times a day (BID) | ORAL | Status: DC
  Administered 2017-05-19 – 2017-05-21 (×4): 1 via ORAL
  Filled 2017-05-18 (×8): qty 1

## 2017-05-18 MED ORDER — WARFARIN - PHYSICIAN DOSING INPATIENT: Freq: Every day | Status: DC

## 2017-05-18 MED ORDER — INSULIN ASPART 100 UNIT/ML ~~LOC~~ SOLN: 0.0000 [IU] | Freq: Every day | SUBCUTANEOUS | Status: DC

## 2017-05-18 MED ORDER — BISACODYL 5 MG PO TBEC: 10.0000 mg | DELAYED_RELEASE_TABLET | Freq: Every day | ORAL | Status: DC | PRN

## 2017-05-18 MED ORDER — FAMOTIDINE 20 MG PO TABS: 20.0000 mg | ORAL_TABLET | Freq: Every day | ORAL | Status: DC | PRN

## 2017-05-18 MED ORDER — SODIUM CHLORIDE 0.9 % IV SOLN
INTRAVENOUS | Status: AC
  Administered 2017-05-18: 23:00:00 via INTRAVENOUS

## 2017-05-18 MED ORDER — LACTATED RINGERS IV SOLN
INTRAVENOUS | Status: DC
  Administered 2017-05-18: 18:00:00 via INTRAVENOUS

## 2017-05-18 MED ORDER — CITALOPRAM HYDROBROMIDE 20 MG PO TABS
20.0000 mg | ORAL_TABLET | Freq: Every day | ORAL | Status: DC
  Administered 2017-05-19 – 2017-05-22 (×4): 20 mg via ORAL
  Filled 2017-05-18 (×4): qty 1

## 2017-05-18 MED ORDER — ASPIRIN EC 81 MG PO TBEC
81.0000 mg | DELAYED_RELEASE_TABLET | Freq: Every day | ORAL | Status: DC
  Administered 2017-05-19 – 2017-05-22 (×4): 81 mg via ORAL
  Filled 2017-05-18 (×5): qty 1

## 2017-05-18 MED ORDER — IBUPROFEN 400 MG PO TABS: 400.0000 mg | ORAL_TABLET | Freq: Every day | ORAL | Status: DC | PRN

## 2017-05-18 MED ORDER — MAGNESIUM OXIDE 400 (241.3 MG) MG PO TABS
400.0000 mg | ORAL_TABLET | Freq: Two times a day (BID) | ORAL | Status: DC
  Administered 2017-05-19 – 2017-05-22 (×7): 400 mg via ORAL
  Filled 2017-05-18 (×7): qty 1

## 2017-05-18 MED ORDER — LACTATED RINGERS IV BOLUS (SEPSIS): 1000.0000 mL | Freq: Once | INTRAVENOUS | Status: DC

## 2017-05-18 MED ORDER — ONDANSETRON HCL 4 MG/2ML IJ SOLN
4.0000 mg | Freq: Once | INTRAMUSCULAR | Status: AC
  Administered 2017-05-18: 4 mg via INTRAVENOUS
  Filled 2017-05-18: qty 2

## 2017-05-18 MED ORDER — ASPIRIN 81 MG PO TBEC: 81.0000 mg | DELAYED_RELEASE_TABLET | Freq: Every day | ORAL | Status: DC

## 2017-05-18 MED ORDER — INSULIN ASPART 100 UNIT/ML ~~LOC~~ SOLN
0.0000 [IU] | Freq: Three times a day (TID) | SUBCUTANEOUS | Status: DC
  Administered 2017-05-20: 2 [IU] via SUBCUTANEOUS
  Administered 2017-05-20 – 2017-05-22 (×4): 1 [IU] via SUBCUTANEOUS

## 2017-05-18 NOTE — ED Triage Notes (Signed)
Per ems, pt from home, lives with husband, had open heart surgery on Aug 14th, dr. bensimon is heart doctor. Saw him Friday had normal appt, everything was well, stitches healing well, today about 0730, woke up with chest pressure 2/10 radiation down left arm. Pt given 324 asa, spit some of it out due to nausea, given 1 nitro made pain down to 2/10. Given 4 po zofran.

## 2017-05-18 NOTE — ED Provider Notes (Signed)
[facility] DEPT Provider Note   CSN: [id number] Arrival date & time: 05/18/17  1512     History   Chief Complaint Chief Complaint  Patient presents with  . Chest Pain    HPI [name] is a 83 y.o. female.  HPI  He presents after an episode of chest pressure. She notes that she was seen 2 days ago by her cardiologist, at that point was doing generally well aside from persistent A. Fib. During that visit the patient had her amiodarone dosing increased. Yesterday the patient felt slightly unwell, without focal pain, discomfort or dyspnea. Today, without clear precipitant the patient developed chest pressure, lightheadedness, nausea. Currently the pain has resolved entirely. She does feel generally weak.  Patient arrives via EMS. Paramedics provided nitroglycerin in route, and the patient was intolerant of aspirin. Patient notes that one month ago she had surgery with mitral valve replacement and coronary bypass.   Past Medical History:  Diagnosis Date  . Allergy   . Anxiety   . Arthritis   . Atrial enlargement, left   . CHF (congestive heart failure) ([place])   . Coronary artery disease   . Depression   . Diabetes mellitus without complication (HCC)    fasting cbg 110s  . Dyspnea   . GERD (gastroesophageal reflux disease)   . H/O hiatal hernia   . Headache(784.0)   . Heart murmur    06/10/2014 seeing new cardiologist  . Heart murmur   . HOH (hard of hearing)    HOH in left ear; needs to speak to patient in right ear  . Hyperlipidemia   . Hypertension   . Mitral regurgitation   . Mitral valve prolapse   . Palpitations    afib on 03/04/17 EKG  . PONV (postoperative nausea and vomiting)    Patient stated "I like the patch behind my ear"  . 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 S/P mitral valve replacement with bioprosthetic valve 04/23/2017   [facility] Mitral bovine pericardial tissue valve: Model 7300TFX, Serial [id number], size  31  . UTI (lower urinary tract infection)     Patient Active Problem List   Diagnosis Date Noted  . Atrial fibrillation ([place]) [I48.91] 05/10/2017  . Long term (current) use of anticoagulants [Z79.01] 05/10/2017  . S/P CABG x 1 04/23/2017  . S/P mitral valve replacement with bioprosthetic valve + CABG x1 04/23/2017  . CAD (coronary artery disease) 04/03/2017  . Shortness of breath 03/28/2017  . Macular degeneration 02/06/2017  . Scoliosis/kyphoscoliosis 08/08/2015  . Lumbar spondylosis 03/08/2015  . Type 2 diabetes mellitus, controlled ([place]) 12/13/2014  . Herniated nucleus pulposus, L5-S1, left 06/08/2014  . GERD without esophagitis 05/21/2014  . Atrophic vaginitis 05/21/2014  . Herniated nucleus pulposus, L5-S1, right 03/16/2014  . Perimenopausal atrophic vaginitis 12/07/2013  . Familial tremor 04/07/2013  . Type 2 diabetes mellitus ([place]) 07/02/2012  . Adenomatous colon polyp 06/04/2012  . Chronic insomnia 03/31/2011  . Depression 02/19/2011  . Hyperlipidemia 02/19/2011  . Anemia 02/19/2011  . HYPERTENSION, BENIGN 02/14/2010  . MITRAL REGURGITATION 02/26/2009  . Mitral valve disorder 02/26/2009  . ATRIAL ENLARGEMENT, LEFT 02/26/2009  . PALPITATIONS 02/26/2009    Past Surgical History:  Procedure Laterality Date  . ABDOMINAL HYSTERECTOMY    . Anterior posterior and enterocele repairs  09/21/2004   With uterosacral cardinal colposuspension, partial colpocleisis; Selinda Orion, MD  . BACK SURGERY    . BREAST  ENHANCEMENT SURGERY  02/25/2002   Bilateral reduction and excision of accessory breast tissue underneath the left breast; Aretha Parrot., MD  . BREAST SURGERY  11   reduction  . CARDIAC CATHETERIZATION     no PCI  . CARDIAC CATHETERIZATION  05/02/2017  . CATARACT EXTRACTION Bilateral   . CLIPPING OF ATRIAL APPENDAGE  04/23/2017   Procedure: CLIPPING OF LEFT ATRIAL APPENDAGE;  Surgeon: [name], MD;  Location: [facility];  Service: Open Heart Surgery;;    . COLONOSCOPY    . CORONARY ARTERY BYPASS GRAFT N/A 04/23/2017   Procedure: CORONARY ARTERY BYPASS GRAFTING (CABG), ON PUMP, TIMES ONE, USING ENDOSCOPICALLY HARVESTED RIGHT GREATER SAPHENOUS VEIN;  Surgeon: [name], MD;  Location: [facility];  Service: Open Heart Surgery;  Laterality: N/A;  . EYE SURGERY Bilateral    cataract removal  . LUMBAR LAMINECTOMY/ DECOMPRESSION WITH MET-RX N/A 03/16/2014   Procedure: LUMBAR FIVE-SACRAL ONE EXTRAFORAMINAL DISKECTOMY WITH METREX;  Surgeon: Kristeen Miss, MD;  Location: [facility]UR[facility];  Service: Neurosurgery;  Laterality: N/A;  . LUMBAR LAMINECTOMY/DECOMPRESSION MICRODISCECTOMY Left 06/08/2014   Procedure: Left Lumbar Five-Sacral One Microdiskectomy;  Surgeon: Kristeen Miss, MD;  Location: [facility];  Service: Neurosurgery;  Laterality: Left;  Left L5-S1 Microdiskectomy  . MITRAL VALVE REPLACEMENT N/A 04/23/2017   Procedure: MITRAL VALVE (MV) REPLACEMENT;  Surgeon: [name], MD;  Location: [facility];  Service: Open Heart Surgery;  Laterality: N/A;  . PELVIC FLOOR REPAIR    . POSTERIOR LUMBAR FUSION 4 LEVEL N/A 08/08/2015   Procedure: T8-L1 posterior lateral fusion with decompression T12-L1;  Surgeon: Kristeen Miss, MD;  Location: [facility];  Service: Neurosurgery;  Laterality: N/A;  T8-L1 posterior lateral fusion with decompression T12-L1  . RIGHT/LEFT HEART CATH AND CORONARY ANGIOGRAPHY N/A 03/28/2017   Procedure: Right/Left Heart Cath and Coronary Angiography;  Surgeon: Nelva Bush, MD;  Location: [facility];  Service: Cardiovascular;  Laterality: N/A;  . ROTATOR CUFF REPAIR Bilateral    11.12  . TEE WITHOUT CARDIOVERSION N/A 03/04/2017   Procedure: TRANSESOPHAGEAL ECHOCARDIOGRAM (TEE);  Surgeon: Fay Records, MD;  Location: [facility];  Service: Cardiovascular;  Laterality: N/A;  . TEE WITHOUT CARDIOVERSION N/A 04/23/2017   Procedure: TRANSESOPHAGEAL ECHOCARDIOGRAM (TEE);  Surgeon: [name], MD;  Location: [facility];  Service:  Open Heart Surgery;  Laterality: N/A;  . TONSILLECTOMY AND ADENOIDECTOMY      OB History    No data available       Home Medications    Prior to Admission medications   Medication Sig Start Date End Date Taking? Authorizing Provider  amiodarone (PACERONE) 400 MG tablet Take 1 tablet (400 mg total) by mouth 2 (two) times daily. 05/15/17  Yes Shirley Friar, PA-C  aspirin EC 81 MG EC tablet Take 1 tablet (81 mg total) by mouth daily. 05/07/17  Yes Lars Pinks M, PA-C  bisacodyl (DULCOLAX) 5 MG EC tablet Take 10 mg by mouth daily as needed (for constipation.).   Yes [provider]  citalopram (CELEXA) 20 MG tablet Take 1 tablet (20 mg total) by mouth daily. 02/06/17  Yes Burchette, Alinda Sierras, MD  clonazePAM (KLONOPIN) 0.5 MG tablet Take one tablet by mouth PRN insomnia or anxiety. 05/07/17  Yes Lars Pinks M, PA-C  esomeprazole (NEXIUM) 20 MG capsule Take 20 mg by mouth daily at 12 noon.   Yes [provider]  famotidine (PEPCID) 20 MG tablet Take 20 mg by mouth daily as needed for  heartburn or indigestion.   Yes [provider]  fexofenadine (ALLEGRA) 180 MG tablet Take 90 mg by mouth daily as needed for allergies or rhinitis (allergy headache).   Yes [provider]  furosemide (LASIX) 20 MG tablet Take 1 tablet (20 mg total) by mouth daily. 05/07/17  Yes Lars Pinks M, PA-C  HYDROcodone-acetaminophen (NORCO/VICODIN) 5-325 MG tablet Take 0.5 tablets by mouth every 6 (six) hours as needed for severe pain. 05/07/17  Yes Lars Pinks M, PA-C  ibuprofen (ADVIL,MOTRIN) 200 MG tablet Take 400 mg by mouth daily as needed for headache or mild pain.    Yes [provider]  magnesium oxide (MAG-OX) 400 (241.3 Mg) MG tablet Take 1 tablet (400 mg total) by mouth 2 (two) times daily. 05/08/17  Yes Lars Pinks M, PA-C  metFORMIN (GLUCOPHAGE) 500 MG tablet Take 2 tablets (1,000 mg total) by mouth 2 (two) times daily with a  meal. 03/31/17  Yes End, Harrell Gave, MD  Probiotic Product (PROBIOTIC DAILY PO) Take 2 capsules by mouth daily.    Yes [provider]  sacubitril-valsartan (ENTRESTO) 24-26 MG Take 1 tablet by mouth 2 (two) times daily. 05/07/17  Yes Lars Pinks M, PA-C  spironolactone (ALDACTONE) 25 MG tablet Take 0.5 tablets (12.5 mg total) by mouth at bedtime. 05/15/17  Yes Shirley Friar, PA-C  trimethoprim (TRIMPEX) 100 MG tablet TAKE 1 TABLET(100 MG) BY MOUTH DAILY Patient taking differently: TAKE 1 TABLET(100 MG) BY MOUTH in the morning 10/29/16  Yes Burchette, Alinda Sierras, MD  warfarin (COUMADIN) 2.5 MG tablet Take 1 tablet (2.5 mg total) by mouth daily. Or as directed. 05/07/17 05/07/18 Yes Lars Pinks M, PA-C  conjugated estrogens (PREMARIN) vaginal cream Place 1 Applicatorful vaginally daily. Patient taking differently: Place 1 Applicatorful vaginally every 14 (fourteen) days.  02/06/17   Burchette, Alinda Sierras, MD  fluticasone (FLONASE) 50 MCG/ACT nasal spray SHAKE LIQUID AND USE 2 SPRAYS IN EACH NOSTRIL DAILY AS NEEDED FOR ALLERGIES 09/14/16   Burchette, Alinda Sierras, MD  Multiple Vitamins-Minerals (ICAPS AREDS 2) CAPS Take 1 capsule by mouth 2 (two) times daily.    [provider]  Jonetta Speak LANCETS 62B MISC USE TO TEST TWICE DAILY AS DIRECTED 11/13/16   Burchette, Alinda Sierras, MD  ONETOUCH VERIO test strip USE TO CHECK BLOOD GLUCOSE TWICE DAILY AS DIRECTED 05/30/16   Burchette, Alinda Sierras, MD    Family History Family History  Problem Relation Age of Onset  . Heart failure Father   . Arthritis Father   . Hyperlipidemia Father   . Heart disease Father   . Arthritis Mother   . Hyperlipidemia Mother   . Hypertension Mother     Social History Social History  Substance Use Topics  . Smoking status: Never Smoker  . Smokeless tobacco: Never Used  . Alcohol use No     Allergies   Cortisone; Statins; Codeine; Sulfonamide derivatives; and Lactose intolerance (gi)   Review  of Systems Review of Systems  Constitutional:       Per HPI, otherwise negative  HENT:       Per HPI, otherwise negative  Respiratory:       Per HPI, otherwise negative  Cardiovascular:       Per HPI, otherwise negative  Gastrointestinal: Positive for nausea. Negative for vomiting.  Endocrine:       Negative aside from HPI  Genitourinary:       Neg aside from HPI   Musculoskeletal:       Per  HPI, otherwise negative  Skin: Negative.   Neurological: Positive for weakness and light-headedness. Negative for syncope.     Physical Exam Updated Vital Signs BP 110/71   Pulse 64   Temp 97.9 F (36.6 C) (Oral)   Resp 20   Ht 5' 6.5" (1.689 m)   Wt 54.9 kg (121 lb)   SpO2 96%   BMI 19.24 kg/m   Physical Exam  Constitutional: She is oriented to person, place, and time. She has a sickly appearance. No distress.  HENT:  Head: Normocephalic and atraumatic.  Eyes: Conjunctivae and EOM are normal.  Cardiovascular: Normal rate and regular rhythm.   Pulmonary/Chest: Effort normal and breath sounds normal. No stridor. No respiratory distress.    Abdominal: She exhibits no distension.  Musculoskeletal: She exhibits no edema.  Neurological: She is alert and oriented to person, place, and time. No cranial nerve deficit.  Skin: Skin is warm and dry.  Psychiatric: She has a normal mood and affect.  Nursing note and vitals reviewed.    ED Treatments / Results  Labs (all labs ordered are listed, but only abnormal results are displayed) Labs Reviewed  BASIC METABOLIC PANEL - Abnormal; Notable for the following:       Result Value   Sodium 128 (*)    Chloride 96 (*)    CO2 17 (*)    Glucose, Bld 192 (*)    BUN 28 (*)    Creatinine, Ser 1.52 (*)    GFR calc non Af Amer 31 (*)    GFR calc Af Amer 36 (*)    All other components within normal limits  CBC - Abnormal; Notable for the following:    Hemoglobin 11.6 (*)    HCT 35.9 (*)    Platelets 408 (*)    All other components  within normal limits  TROPONIN I - Abnormal; Notable for the following:    Troponin I 0.25 (*)    All other components within normal limits  BRAIN NATRIURETIC PEPTIDE - Abnormal; Notable for the following:    B Natriuretic Peptide 1,219.6 (*)    All other components within normal limits  I-STAT TROPONIN, ED - Abnormal; Notable for the following:    Troponin i, poc 0.17 (*)    All other components within normal limits    EKG  EKG Interpretation  Date/Time:  Saturday May 18 2017 15:19:38 EDT Ventricular Rate:  70 PR Interval:    QRS Duration: 118 QT Interval:  577 QTC Calculation: 623 R Axis:   98 Text Interpretation:  junctional rhtyhm or sinus with first degree block. T wave abnormality Artifact Abnormal ekg Confirmed by Carmin Muskrat [phone]) on 05/18/2017 5:01:51 PM       Radiology Dg Chest 2 View  Result Date: 05/18/2017 CLINICAL DATA:  Post CABG, MVR and clipping of LEFT atrial appendage on 04/23/2017, having chest pain, pain in LEFT arm, nausea, epigastric pressure, onset of symptoms today; history coronary artery disease, diabetes mellitus, CHF, atrial fibrillation EXAM: CHEST  2 VIEW COMPARISON:  05/06/2017 FINDINGS: Upper normal heart size post CABG, MVR and LEFT atrial appendage clipping. Mediastinal contours and pulmonary vascularity normal. Atherosclerotic calcification aorta. Hiatal hernia present. Lungs mildly hyperinflated with central peribronchial thickening. Minimal bibasilar atelectasis. No definite acute infiltrate, pleural effusion or pneumothorax. Bones demineralized with evidence of prior thoracolumbar spinal fixation. IMPRESSION: Post cardiac surgery changes. Bronchitic changes with hyperinflation and mild bibasilar atelectasis. Hiatal hernia. Electronically Signed   By: Lavonia Dana M.D.   On:  05/18/2017 17:10    Procedures Procedures (including critical care time)  Medications Ordered in ED Medications  lactated ringers infusion ( Intravenous New  Bag/Given 05/18/17 1731)     Initial Impression / Assessment and Plan / ED Course  I have reviewed the triage vital signs and the nursing notes.  Pertinent labs & imaging results that were available during my care of the patient were reviewed by me and considered in my medical decision making (see chart for details).  Chart reviewed after the initial evaluation, the documentation from her surgical procedure as well as echocardiogram, and studies, as below  Study Conclusions   - Left ventricle: The cavity size was normal. There was mild   concentric hypertrophy. Systolic function was severely reduced.   The estimated ejection fraction was in the range of 25% to 30%.   Diffuse hypokinesis. The study is not technically sufficient to   allow evaluation of LV diastolic function. - Aortic valve: Transvalvular velocity was within the normal range.   There was no stenosis. There was no regurgitation. - Mitral valve: A bioprosthesis was present and functioning   normally. Transvalvular velocity was within the normal range.   There was no evidence for stenosis. There was no regurgitation.   Valve area by continuity equation (using LVOT flow): 1.04 cm^2. - Right ventricle: The cavity size was normal. Wall thickness was   normal. Systolic function was moderately reduced. - Tricuspid valve: There was moderate regurgitation. - Pulmonary arteries: Systolic pressure was within the normal   range. PA peak pressure: 38 mm Hg (S). - Pericardium, extracardiac: There was a left pleural effusion.   Minimal ascites was noted. Procedure (s):   Mitral Valve Replacement              [employer] Magna Mitral Bovine Bioprosthetic Tissue Valve (size 8m, model #7300TFX, serial ##[id number]             Clipping of left atrial appendage (Atriclip size 465m    Coronary Artery Bypass Grafting x 1              Reversed Greater Saphenous Vein Graft to Distal Right Coronary Artery             Endoscopic Vein Harvest  from Right Thigh by Dr. OwRoxy Mannsn 04/23/2017.   Update: Initial labs notable for elevated troponin. Patient remains chest pain-free. Update:, Formal treatment also positive, 0.25. Labs otherwise notable for elevated creatinine. The patient does have documented CHF, with ejection fraction less than 30%, she does appear dry on physical exam. Patient will receive fluid resuscitation   Update: Patient remains weak appearing on repeat exam after initial labs were notable for elevated troponin. Initial labs also notable for elevated creatinine, double her baseline. Patient confirms that she has been losing weight, and this fits description findings consistent with dehydration. However, the patient's BNP value was elevated, and she has a noted history of diminished ejection fraction. Patient will receive gentle fluid resuscitation.  Second troponin to confirm first abnormal result was also elevated. I discussed patient's case with her cardiology team given her recent coronary bypass, mitral valve replacement, congestive heart failure, and today's abnormal findings, after an episode of chest pain.  Update: Patient remains chest pain-free. She is receiving fluids.  Given the aforementioned critical abnormal findings, elevated troponin, though there is some suspicion for metabolic demand given her presentation consistent with dehydration, she will require admission for further evaluation and management. Final Clinical Impressions(s) / ED Diagnoses  Chest  pain Elevated troponin Elevated creatinine  CRITICAL CARE Performed by: Carmin Muskrat Total critical care time: 35 minutes Critical care time was exclusive of separately billable procedures and treating other patients. Critical care was necessary to treat or prevent imminent or life-threatening deterioration. Critical care was time spent personally by me on the following activities: development of treatment plan with patient and/or surrogate  as well as nursing, discussions with consultants, evaluation of patient's response to treatment, examination of patient, obtaining history from patient or surrogate, ordering and performing treatments and interventions, ordering and review of laboratory studies, ordering and review of radiographic studies, pulse oximetry and re-evaluation of patient's condition.    Carmin Muskrat, MD 05/18/17 2042

## 2017-05-18 NOTE — ED Notes (Addendum)
Troponin 0.25 critical value. Dr. Vanita Panda notified

## 2017-05-18 NOTE — ED Notes (Signed)
EKG given to Dr. Lockwood. 

## 2017-05-18 NOTE — H&P (Signed)
CARDIOLOGY INPATIENT HISTORY AND PHYSICAL EXAMINATION NOTE  Patient ID: [name] MRN: [id number], DOB/AGE: 14-Apr-1934   Admit date: 05/18/2017   Primary Physician: Eulas Post, MD Primary Cardiologist: Dorris Carnes MD Primary advanced heart failure physician: Pierre Bali MD  Reason for admission: nausea, fatigue and left upper quadrant pressure sensation  HPI: This is a 83 y.o. female with h/o MVP s/p bioprosthetic MV replacement, HTN,  NIDDM2, scoliosis, tremor, CAD s/p rSVG to RCA (CABG) on 04/23/2017, persistent Afib on amiodarone and warfarin presented with LUQ pressure, fatigue and nausea.  Patient has not been feeling well since discharged on 8/29 after treatment of MVP with bioprosthetic tissue MVR and CABG performed on 8/14. His post operative course was complicated by cardiogenic shock and patient was managed with IV milrinone. D/c wt was 120 lbs. Today she weighs 121 lbs. She woke up in the morning today and walked to the living room where she felt pressure like sensation in the LUQ along with nausea. She was able to eat a little bit of breakfast but then had vomiting afterwards. She has been fatigued as well.  Three days ago she was found to be in afib with RVR and her amiodarone which was 200 mg was increased to 400 mg BID. She has not been drinking enough fluids and in the last 24 hours has drank 8 oz only. She is able to talk 150 ft with cane. She denied worsneing SOB, chest pain, leg swelling PND, and has been compliant with the medications. On arrival, her creatinine is elevated and she appears dry on examination. She is being admitted for evaluation.    EKG appears to be junctional rhythm with QT  620 ms  Echo 04/26/17 LVEF 25-30%, Stable MVR, Moderate TR, Peak PA pressure 38 mm, RV moderately reduced function.  Echo TEE 03/04/17 LVEF normal, Mild MVP with regurgitant jets, predominant jet posteriorly, Severe LAE.  R/LHC 03/28/17  1. Severe single-vessel coronary  artery disease with 60% proximal and 99% distal RCA stenoses as well as chronic total occlusion of the ostial rPDA. The rPDA andr PL branches are supplied by left-to-right collaterals. 2. 70% proximal D2 stenosis is evident. Otherwise, there is mild, nonobstructive disease involving the LAD, LCx, and their branches. 3. Normal left and right heart filling pressures. Normal pulmonary artery pressure. Normal Fick cardiac output/index. RHC Procedural Findings: Hemodynamics (mmHg) RA mean 4 RV 30/2 PA 28/8 PCWP 7 AO 107/45 Cardiac Output (Fick) 4.67 Cardiac Index (Fick) 2.78  Problem List: Past Medical History:  Diagnosis Date  . Allergy   . Anxiety   . Arthritis   . Atrial enlargement, left   . CHF (congestive heart failure) ([place])   . Coronary artery disease   . Depression   . Diabetes mellitus without complication (HCC)    fasting cbg 110s  . Dyspnea   . GERD (gastroesophageal reflux disease)   . H/O hiatal hernia   . Headache(784.0)   . Heart murmur    06/10/2014 seeing new cardiologist  . Heart murmur   . HOH (hard of hearing)    HOH in left ear; needs to speak to patient in right ear  . Hyperlipidemia   . Hypertension   . Mitral regurgitation   . Mitral valve prolapse   . Palpitations    afib on 03/04/17 EKG  . PONV (postoperative nausea and vomiting)    Patient stated "I like the patch behind my ear"  . 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 S/P CABG  x 1 04/23/2017   SVG to distal RCA  . S/P mitral valve replacement with bioprosthetic valve 04/23/2017   [facility] Mitral bovine pericardial tissue valve: Model 7300TFX, Serial [id number], size 31  . UTI (lower urinary tract infection)     Past Surgical History:  Procedure Laterality Date  . ABDOMINAL HYSTERECTOMY    . Anterior posterior and enterocele repairs  09/21/2004   With uterosacral cardinal colposuspension, partial colpocleisis; Selinda Orion, MD  . BACK SURGERY    . BREAST ENHANCEMENT SURGERY  02/25/2002   Bilateral  reduction and excision of accessory breast tissue underneath the left breast; Aretha Parrot., MD  . BREAST SURGERY  11   reduction  . CARDIAC CATHETERIZATION     no PCI  . CARDIAC CATHETERIZATION  05/02/2017  . CATARACT EXTRACTION Bilateral   . CLIPPING OF ATRIAL APPENDAGE  04/23/2017   Procedure: CLIPPING OF LEFT ATRIAL APPENDAGE;  Surgeon: [name], MD;  Location: [place];  Service: Open Heart Surgery;;  . COLONOSCOPY    . CORONARY ARTERY BYPASS GRAFT N/A 04/23/2017   Procedure: CORONARY ARTERY BYPASS GRAFTING (CABG), ON PUMP, TIMES ONE, USING ENDOSCOPICALLY HARVESTED RIGHT GREATER SAPHENOUS VEIN;  Surgeon: [name], MD;  Location: [place];  Service: Open Heart Surgery;  Laterality: N/A;  . EYE SURGERY Bilateral    cataract removal  . LUMBAR LAMINECTOMY/ DECOMPRESSION WITH MET-RX N/A 03/16/2014   Procedure: LUMBAR FIVE-SACRAL ONE EXTRAFORAMINAL DISKECTOMY WITH METREX;  Surgeon: Kristeen Miss, MD;  Location: [place] NEURO [facility];  Service: Neurosurgery;  Laterality: N/A;  . LUMBAR LAMINECTOMY/DECOMPRESSION MICRODISCECTOMY Left 06/08/2014   Procedure: Left Lumbar Five-Sacral One Microdiskectomy;  Surgeon: Kristeen Miss, MD;  Location: [place] NEURO ORS;  Service: Neurosurgery;  Laterality: Left;  Left L5-S1 Microdiskectomy  . MITRAL VALVE REPLACEMENT N/A 04/23/2017   Procedure: MITRAL VALVE (MV) REPLACEMENT;  Surgeon: [name], MD;  Location: [place];  Service: Open Heart Surgery;  Laterality: N/A;  . PELVIC FLOOR REPAIR    . POSTERIOR LUMBAR FUSION 4 LEVEL N/A 08/08/2015   Procedure: T8-L1 posterior lateral fusion with decompression T12-L1;  Surgeon: Kristeen Miss, MD;  Location: [place] [facility];  Service: Neurosurgery;  Laterality: N/A;  T8-L1 posterior lateral fusion with decompression T12-L1  . RIGHT/LEFT HEART CATH AND CORONARY ANGIOGRAPHY N/A 03/28/2017   Procedure: Right/Left Heart Cath and Coronary Angiography;  Surgeon: Nelva Bush, MD;  Location: [place] CV LAB;  Service:  Cardiovascular;  Laterality: N/A;  . ROTATOR CUFF REPAIR Bilateral    11.12  . TEE WITHOUT CARDIOVERSION N/A 03/04/2017   Procedure: TRANSESOPHAGEAL ECHOCARDIOGRAM (TEE);  Surgeon: Fay Records, MD;  Location: [place];  Service: Cardiovascular;  Laterality: N/A;  . TEE WITHOUT CARDIOVERSION N/A 04/23/2017   Procedure: TRANSESOPHAGEAL ECHOCARDIOGRAM (TEE);  Surgeon: [name], MD;  Location: [place];  Service: Open Heart Surgery;  Laterality: N/A;  . TONSILLECTOMY AND ADENOIDECTOMY       Allergies:  Allergies  Allergen Reactions  . Cortisone Other (See Comments)    Insomnia, heart palpitations (po only)  . Statins Other (See Comments)    Myalgias   . Codeine Swelling    FACIAL SWELLING SEVERITY UNKNOWN  . Sulfonamide Derivatives     UNSPECIFIED REACTION   . Lactose Intolerance (Gi) Nausea And Vomiting     Home Medications Current Facility-Administered Medications  Medication Dose Route Frequency Provider Last Rate Last Dose  . lactated ringers infusion   Intravenous Continuous Carmin Muskrat, MD 125 mL/hr at 05/18/17 2224  Family History  Problem Relation Age of Onset  . Heart failure Father   . Arthritis Father   . Hyperlipidemia Father   . Heart disease Father   . Arthritis Mother   . Hyperlipidemia Mother   . Hypertension Mother      Social History   Social History  . Marital status: Married    Spouse name: N/A  . Number of children: N/A  . Years of education: N/A   Occupational History  . Retired Retired   Social History Main Topics  . Smoking status: Never Smoker  . Smokeless tobacco: Never Used  . Alcohol use No  . Drug use: No  . Sexual activity: Not on file   Other Topics Concern  . Not on file   Social History Narrative   Married   Regular exercise     Review of Systems: General: fatigue, malaise, weight 121 lbs  Cardiovascular: epigastric pressure, DOE Dermatological: negative for rash Respiratory: negative for cough  or wheezing Urologic: negative for hematuria Abdominal: nausea, no diarrhea, anorexia Neurologic: negative for visual changes, syncope, or dizziness Endocrine: diabetes, no hypothyroidism Immunological: no lymph adenopathy Psych: non homicidal/suicidal  Physical Exam: Vitals: BP 119/64 (BP Location: Right Arm)   Pulse 61   Temp 98.1 F (36.7 C) (Oral)   Resp 16   Ht 5' 6.5" (1.689 m)   Wt 54.8 kg (120 lb 13 oz)   SpO2 97%   BMI 19.21 kg/m  General: not in acute distress Neck: JVP mildly elevated at 45 degree 6-8 cm, neck supple Heart: regular rate and rhythm, S1, S2, diastolic murmur Lungs: CTAB  GI: non tender, non distended, bowel sounds present Extremities: no edema Neuro: AAO x 3  Psych: normal affect, no anxiety  Labs:   Results for orders placed or performed during the hospital encounter of 05/18/17 (from the past 24 hour(s))  Basic metabolic panel     Status: Abnormal   Collection Time: 05/18/17  3:27 PM  Result Value Ref Range   Sodium 128 (L) 135 - 145 mmol/L   Potassium 5.0 3.5 - 5.1 mmol/L   Chloride 96 (L) 101 - 111 mmol/L   CO2 17 (L) 22 - 32 mmol/L   Glucose, Bld 192 (H) 65 - 99 mg/dL   BUN 28 (H) 6 - 20 mg/dL   Creatinine, Ser 1.52 (H) 0.44 - 1.00 mg/dL   Calcium 9.3 8.9 - 10.3 mg/dL   GFR calc non Af Amer 31 (L) >60 mL/min   GFR calc Af Amer 36 (L) >60 mL/min   Anion gap 15 5 - 15  CBC     Status: Abnormal   Collection Time: 05/18/17  3:27 PM  Result Value Ref Range   WBC 9.3 4.0 - 10.5 K/uL   RBC 3.87 3.87 - 5.11 MIL/uL   Hemoglobin 11.6 (L) 12.0 - 15.0 g/dL   HCT 35.9 (L) 36.0 - 46.0 %   MCV 92.8 78.0 - 100.0 fL   MCH 30.0 26.0 - 34.0 pg   MCHC 32.3 30.0 - 36.0 g/dL   RDW 15.4 11.5 - 15.5 %   Platelets 408 (H) 150 - 400 K/uL  Troponin I     Status: Abnormal   Collection Time: 05/18/17  3:27 PM  Result Value Ref Range   Troponin I 0.25 (HH) <0.03 ng/mL  Brain natriuretic peptide     Status: Abnormal   Collection Time: 05/18/17  3:27 PM    Result Value Ref Range   B  Natriuretic Peptide 1,219.6 (H) 0.0 - 100.0 pg/mL  I-stat troponin, ED     Status: Abnormal   Collection Time: 05/18/17  3:42 PM  Result Value Ref Range   Troponin i, poc 0.17 (HH) 0.00 - 0.08 ng/mL   Comment NOTIFIED PHYSICIAN    Comment 3          CBG monitoring, ED     Status: Abnormal   Collection Time: 05/18/17  9:35 PM  Result Value Ref Range   Glucose-Capillary 139 (H) 65 - 99 mg/dL     Radiology/Studies: Dg Chest 2 View  Result Date: 05/18/2017 CLINICAL DATA:  Post CABG, MVR and clipping of LEFT atrial appendage on 04/23/2017, having chest pain, pain in LEFT arm, nausea, epigastric pressure, onset of symptoms today; history coronary artery disease, diabetes mellitus, CHF, atrial fibrillation EXAM: CHEST  2 VIEW COMPARISON:  05/06/2017 FINDINGS: Upper normal heart size post CABG, MVR and LEFT atrial appendage clipping. Mediastinal contours and pulmonary vascularity normal. Atherosclerotic calcification aorta. Hiatal hernia present. Lungs mildly hyperinflated with central peribronchial thickening. Minimal bibasilar atelectasis. No definite acute infiltrate, pleural effusion or pneumothorax. Bones demineralized with evidence of prior thoracolumbar spinal fixation. IMPRESSION: Post cardiac surgery changes. Bronchitic changes with hyperinflation and mild bibasilar atelectasis. Hiatal hernia. Electronically Signed   By: Lavonia Dana M.D.   On: 05/18/2017 17:10   Dg Chest 2 View  Result Date: 05/06/2017 CLINICAL DATA:  Weakness. History of congestive heart failure, valvular heart disease, previous CABG and mitral valve replacement, diabetes. EXAM: CHEST  2 VIEW COMPARISON:  Chest x-ray of May 02, 2017 FINDINGS: The lungs are well-expanded and clear. There is no infiltrate or pleural effusion. There is infrahilar density on the right and in the left retrocardiac region which likely reflects subsegmental atelectasis. The heart and pulmonary vascularity are normal.  There is a left atrial appendage clip present. A prosthetic mitral valve ring is observed. There are post CABG changes. There is calcification in the wall of the aortic arch. A PICC line terminates in the midportion of the SVC. The patient has undergone undergone posterior thoracolumbar fusion. IMPRESSION: Bibasilar subsegmental atelectasis fairly stable on the right but improved on the left. No pulmonary edema. Postsurgical changes as described. Thoracic aortic atherosclerosis. Electronically Signed   By: David  Martinique M.D.   On: 05/06/2017 08:28   Dg Chest 2 View  Result Date: 05/02/2017 CLINICAL DATA:  CHF EXAM: CHEST  2 VIEW COMPARISON:  05/01/2017 FINDINGS: Right PICC line remains in place, unchanged. Changes of prior valve replacement. Left lower lobe atelectasis or infiltrate and areas of right perihilar atelectasis again noted, stable. Mild elevation of the right hemidiaphragm. No visible effusions or pneumothorax. IMPRESSION: Left lower lobe atelectasis or infiltrate and right perihilar atelectasis. No significant change. Electronically Signed   By: Rolm Baptise M.D.   On: 05/02/2017 07:50   Dg Chest 2 View  Result Date: 04/26/2017 CLINICAL DATA:  Weakness EXAM: CHEST  2 VIEW COMPARISON:  04/25/2017 FINDINGS: Cardiac shadow is within normal limits. Postsurgical changes are noted. Aortic calcifications are seen. Bibasilar atelectatic changes are again noted and stable. Right jugular sheath is noted in place. IMPRESSION: Bibasilar atelectasis Electronically Signed   By: Inez Catalina M.D.   On: 04/26/2017 07:15   Dg Chest 2 View  Result Date: 04/19/2017 CLINICAL DATA:  Preoperative respiratory exam for mitral prior replacement. EXAM: CHEST  2 VIEW COMPARISON:  CTA of the chest 04/15/2017 and prior chest x-ray on 03/08/2014 FINDINGS: The heart size and  mediastinal contours are within normal limits. There is no evidence of pulmonary edema, consolidation, pneumothorax, nodule or pleural fluid.  Thoracolumbar spinal fusion hardware and rods are visualized and show no gross abnormalities. IMPRESSION: No active cardiopulmonary disease. Electronically Signed   By: Aletta Edouard M.D.   On: 04/19/2017 14:58   Dg Chest Port 1 View  Result Date: 05/01/2017 CLINICAL DATA:  Sore chest. EXAM: PORTABLE CHEST 1 VIEW COMPARISON:  04/29/2017. FINDINGS: Right PICC line in stable position. Prior cardiac valve replacement. Prior CABG. Left atrial appendage clip noted stable position. Stable cardiomegaly. Persistent bibasilar atelectasis. Small left pleural effusion. No pneumothorax. IMPRESSION: 1.  Right PICC line in stable position. 2.  Prior CABG and cardiac valve replacement.  Stable cardiomegaly. 3. Persistent bibasilar atelectasis, unchanged. Small left pleural effusion. Electronically Signed   By: Marcello Moores  Register   On: 05/01/2017 06:47   Dg Chest Port 1 View  Result Date: 04/29/2017 CLINICAL DATA:  Patient with chest pain. EXAM: PORTABLE CHEST 1 VIEW COMPARISON:  Chest radiograph 04/28/2017 FINDINGS: Right upper extremity PICC line is present with tip projecting over the superior vena cava. Monitoring leads overlie the patient. Interval removal right IJ sheath. Stable enlarged cardiac and mediastinal contours status post median sternotomy. Persistent elevation of the right hemidiaphragm. Probable small left pleural effusion. Left lower hemithorax excluded from view. No pneumothorax. Minimal heterogeneous opacities lung bases bilaterally. IMPRESSION: Right upper extremity PICC line tip projects over the superior vena cava. Left lower hemithorax excluded from view. Probable small left pleural effusion and bibasilar atelectasis. Electronically Signed   By: Lovey Newcomer M.D.   On: 04/29/2017 09:15   Dg Chest Port 1 View  Result Date: 04/28/2017 CLINICAL DATA:  Central catheter placement EXAM: PORTABLE CHEST 1 VIEW COMPARISON:  Study obtained earlier in the day FINDINGS: Right subclavian catheter tip is in  the superior vena cava near the cavoatrial junction. Cordis tip is in the superior vena cava more proximally. There is no appreciable pneumothorax. There is bibasilar atelectatic change with small pleural effusion on the left. Heart is upper normal in size with pulmonary vascularity within normal limits. Patient is status post coronary artery bypass grafting. There is a mitral valve replacement as well as left atrial appendage clamp present. Heart size and pulmonary vascularity are normal. No evident adenopathy. There is aortic atherosclerosis. There is multilevel screw and plate fixation in the lower thoracic and lumbar regions. IMPRESSION: Catheter positions as described without pneumothorax. Small left pleural effusion with bibasilar atelectasis. Stable cardiac silhouette. There is aortic atherosclerosis. Aortic Atherosclerosis (ICD10-I70.0). Electronically Signed   By: Lowella Grip III M.D.   On: 04/28/2017 15:04   Dg Chest Port 1 View  Result Date: 04/28/2017 CLINICAL DATA:  Chest tube placement. EXAM: PORTABLE CHEST 1 VIEW COMPARISON:  April 27, 2017 FINDINGS: Stable support apparatus. Mild atelectasis in the right base. Probable tiny effusion and atelectasis in the left base. No other interval changes or acute abnormalities. IMPRESSION: No significant interval change in the small left effusion and bibasilar atelectasis. Electronically Signed   By: Dorise Bullion III M.D   On: 04/28/2017 07:17   Dg Chest Port 1 View  Result Date: 04/27/2017 CLINICAL DATA:  Chest tube EXAM: PORTABLE CHEST 1 VIEW COMPARISON:  Yesterday FINDINGS: Right IJ sheath in stable position. Mildly increased atelectasis with lower lung volumes. Trace pleural effusions. Stable postoperative heart size with changes of mitral valve replacement, CABG, and left atrial clipping. IMPRESSION: Mildly increased atelectasis. Electronically Signed   By: Angelica Chessman  Watts M.D.   On: 04/27/2017 07:52   Dg Chest Port 1 View  Result Date:  04/25/2017 CLINICAL DATA:  Follow-up chest tube EXAM: PORTABLE CHEST 1 VIEW COMPARISON:  04/24/2017 FINDINGS: Postsurgical changes are again identified. A left-sided thoracostomy catheter is seen. No pneumothorax is noted. Pericardial drain is again noted. Some right basilar atelectasis is seen new from the prior exam. Right jugular sheath remains in place. IMPRESSION: New right basilar atelectasis. Electronically Signed   By: Inez Catalina M.D.   On: 04/25/2017 07:16   Dg Chest Port 1 View  Result Date: 04/24/2017 CLINICAL DATA:  Chest tube.  Prior cardiac surgery. EXAM: PORTABLE CHEST 1 VIEW COMPARISON:  04/23/2017. FINDINGS: Interim extubation and removal of NG tube. Swan-Ganz catheter, mediastinal drainage catheters, bilateral chest tubes in stable position. Prior CABG and cardiac valve replacement. Cardiomegaly with mild bilateral interstitial prominence. Small left pleural effusion. Mild component CHF cannot be excluded. No pneumothorax. IMPRESSION: 1. Interim extubation removal of NG tube. Remaining lines and tubes including bilateral chest tubes in stable position. No pneumothorax. 2. Prior CABG and cardiac valve replacement. Cardiomegaly with mild bilateral interstitial prominence and small left pleural effusion. A mild component CHF cannot be excluded on today's exam. Electronically Signed   By: Marcello Moores  Register   On: 04/24/2017 07:37   Dg Chest Port 1 View  Result Date: 04/23/2017 CLINICAL DATA:  Atelectasis. EXAM: PORTABLE CHEST 1 VIEW COMPARISON:  04/19/2017. FINDINGS: Endotracheal tube, NG tube, mediastinal drainage catheter, Swan-Ganz catheter, bilateral chest tubes in stable position. Prior cardiac valve replacement. Left atrial appendage clip. Bibasilar atelectasis. Small left pleural effusion. No pneumothorax. Thoracic spine fusion. IMPRESSION: 1. Lines and tubes including bilateral chest tubes in stable position. No pneumothorax. 2. Bibasilar atelectasis and left-sided pleural effusion.  Electronically Signed   By: Marcello Moores  Register   On: 04/23/2017 14:09   Medical decision making:  Discussed care with the patient Discussed care with the physician on the phone Reviewed labs and imaging personally Reviewed prior records  ASSESSMENT AND PLAN:  This is a 83 y.o. female recent MVR and CABG on 7/29 complicated by cardiogenic   Shock requiring IV milrinone support who was seen on 9/5 in clinic and her amiodarone was increased to 400 mg BID. On presentation today she appears dry even though her BNP is elevated and has nausea.    Active Problems:   Type 2 diabetes mellitus (HCC)   CAD (coronary artery disease)   S/P CABG x 1   S/P mitral valve replacement with bioprosthetic valve + CABG x1   Acute kidney injury (HCC)   Hyperkalemia   Left upper quadrant pain   Generalized weakness   Junctional rhythm  1. Fatigue, nausea/generalized weakness/left upper quadrant pain - all these symptoms appear to be related to dehydration differential includes amiodarone toxicity; other low likelihood differential is viral gastritis vs. pill related gastritis - will hydrate with 1.4 L at 100 ml an hour, monitor Cr, hold amiodarone for now, IV protonix   2. Chronic Combined Biventricular HF- EF dropped post op-->EF 25-30% with RV dysfunction, NYHA II-III symptoms, euvolemic, JVP is misleading - Continue entresto 24-26 mg BID, holding spironolactone, not on BB - may consider another RHC if creatinine does not improve to guide IV inotrope therapy after fluid challenge tonight, ordered urine sodium to understand volume status, holding lasix   3. CAD s/p CABG x1 04/23/17  - Denies CP, however has pressure like sensation and has borderline troponin elevation which is likely demand ischemia  0.25 initial trop, continue ASA and statin.   4. MVR - bioprosthetic valve and LA clipping.  - Stable on post op echo.   5. PersistentA fib appears to convert with increase in amiodarone however QT is prolonged  >600, will hold amiodarone for now, CHADS2VASc = 6 (female, CAD, age x2, HTN, CHF) - monitor QT with daily EKGs, check morning EKG, coumadin per pharmacy recs  6. Acute kidney injury - checking urine lytes, monitor BMP BID, IV fluid challenge, if does not improve, will need CVP monitoring/Coox.                                                         7. Diabetes mellitus with vascular disease SSI, finger sticks No oral hypoglycemics HbA1c  Signed, Flossie Dibble, MD MS 05/18/2017, 10:35 PM

## 2017-05-19 DIAGNOSIS — I5023 Acute on chronic systolic (congestive) heart failure: Secondary | ICD-10-CM

## 2017-05-19 DIAGNOSIS — I4891 Unspecified atrial fibrillation: Secondary | ICD-10-CM

## 2017-05-19 LAB — TSH: TSH: 1.716 u[IU]/mL (ref 0.350–4.500)

## 2017-05-19 LAB — COMPREHENSIVE METABOLIC PANEL
ALT: 20 U/L (ref 14–54)
AST: 19 U/L (ref 15–41)
Albumin: 3.1 g/dL — ABNORMAL LOW (ref 3.5–5.0)
Alkaline Phosphatase: 97 U/L (ref 38–126)
Anion gap: 11 (ref 5–15)
BUN: 24 mg/dL — AB (ref 6–20)
CO2: 21 mmol/L — ABNORMAL LOW (ref 22–32)
CREATININE: 1.05 mg/dL — AB (ref 0.44–1.00)
Calcium: 8.7 mg/dL — ABNORMAL LOW (ref 8.9–10.3)
Chloride: 97 mmol/L — ABNORMAL LOW (ref 101–111)
GFR calc Af Amer: 56 mL/min — ABNORMAL LOW (ref >60)
GFR, EST NON AFRICAN AMERICAN: 48 mL/min — AB (ref >60)
GLUCOSE: 119 mg/dL — AB (ref 65–99)
Potassium: 4.5 mmol/L (ref 3.5–5.1)
SODIUM: 129 mmol/L — AB (ref 135–145)
TOTAL PROTEIN: 6.6 g/dL (ref 6.5–8.1)
Total Bilirubin: 0.7 mg/dL (ref 0.3–1.2)

## 2017-05-19 LAB — BRAIN NATRIURETIC PEPTIDE: B Natriuretic Peptide: 3509.1 pg/mL — ABNORMAL HIGH (ref 0.0–100.0)

## 2017-05-19 LAB — CBC
HCT: 33.2 % — ABNORMAL LOW (ref 36.0–46.0)
Hemoglobin: 10.9 g/dL — ABNORMAL LOW (ref 12.0–15.0)
MCH: 30.2 pg (ref 26.0–34.0)
MCHC: 32.8 g/dL (ref 30.0–36.0)
MCV: 92 fL (ref 78.0–100.0)
PLATELETS: 369 10*3/uL (ref 150–400)
RBC: 3.61 MIL/uL — ABNORMAL LOW (ref 3.87–5.11)
RDW: 15.4 % (ref 11.5–15.5)
WBC: 7.1 10*3/uL (ref 4.0–10.5)

## 2017-05-19 LAB — URIC ACID: Uric Acid, Serum: 5.7 mg/dL (ref 2.3–6.6)

## 2017-05-19 LAB — BASIC METABOLIC PANEL
Anion gap: 11 (ref 5–15)
BUN: 20 mg/dL (ref 6–20)
CO2: 23 mmol/L (ref 22–32)
CREATININE: 1.05 mg/dL — AB (ref 0.44–1.00)
Calcium: 8.8 mg/dL — ABNORMAL LOW (ref 8.9–10.3)
Chloride: 93 mmol/L — ABNORMAL LOW (ref 101–111)
GFR calc Af Amer: 56 mL/min — ABNORMAL LOW (ref >60)
GFR, EST NON AFRICAN AMERICAN: 48 mL/min — AB (ref >60)
Glucose, Bld: 271 mg/dL — ABNORMAL HIGH (ref 65–99)
Potassium: 4 mmol/L (ref 3.5–5.1)
SODIUM: 127 mmol/L — AB (ref 135–145)

## 2017-05-19 LAB — TROPONIN I
TROPONIN I: 0.25 ng/mL — AB (ref <0.03)
Troponin I: 0.31 ng/mL (ref <0.03)
Troponin I: 0.17 ng/mL (ref <0.03)

## 2017-05-19 LAB — GLUCOSE, CAPILLARY
GLUCOSE-CAPILLARY: 119 mg/dL — AB (ref 65–99)
Glucose-Capillary: 147 mg/dL — ABNORMAL HIGH (ref 65–99)
Glucose-Capillary: 146 mg/dL — ABNORMAL HIGH (ref 65–99)
Glucose-Capillary: 143 mg/dL — ABNORMAL HIGH (ref 65–99)

## 2017-05-19 LAB — HEMOGLOBIN A1C
Hgb A1c MFr Bld: 5.9 % — ABNORMAL HIGH (ref 4.8–5.6)
Mean Plasma Glucose: 122.63 mg/dL

## 2017-05-19 MED ORDER — AMIODARONE HCL 200 MG PO TABS
200.0000 mg | ORAL_TABLET | Freq: Two times a day (BID) | ORAL | Status: DC
  Administered 2017-05-19: 200 mg via ORAL

## 2017-05-19 MED ORDER — AMIODARONE IV BOLUS ONLY 150 MG/100ML
150.0000 mg | Freq: Once | INTRAVENOUS | Status: AC
  Administered 2017-05-19: 150 mg via INTRAVENOUS
  Filled 2017-05-19: qty 100

## 2017-05-19 MED ORDER — FUROSEMIDE 10 MG/ML IJ SOLN
40.0000 mg | Freq: Once | INTRAMUSCULAR | Status: AC
  Administered 2017-05-19: 40 mg via INTRAVENOUS

## 2017-05-19 MED ORDER — MILRINONE LACTATE IN DEXTROSE 20-5 MG/100ML-% IV SOLN: 0.1250 ug/kg/min | INTRAVENOUS | Status: DC

## 2017-05-19 MED ORDER — MILRINONE LACTATE IN DEXTROSE 20-5 MG/100ML-% IV SOLN
0.1250 ug/kg/min | INTRAVENOUS | Status: AC
  Administered 2017-05-19 – 2017-05-20 (×2): 0.125 ug/kg/min via INTRAVENOUS
  Filled 2017-05-19 (×2): qty 100

## 2017-05-19 MED ORDER — FUROSEMIDE 10 MG/ML IJ SOLN
INTRAMUSCULAR | Status: AC
  Filled 2017-05-19: qty 4

## 2017-05-19 MED ORDER — WARFARIN SODIUM 2.5 MG PO TABS
2.5000 mg | ORAL_TABLET | ORAL | Status: DC
  Administered 2017-05-19: 2.5 mg via ORAL
  Filled 2017-05-19: qty 1

## 2017-05-19 MED ORDER — WARFARIN SODIUM 2.5 MG PO TABS: 1.2500 mg | ORAL_TABLET | ORAL | Status: DC

## 2017-05-19 NOTE — Progress Notes (Signed)
Advanced Heart Failure Rounding Note   Subjective:    Admitted last night for progressive fatigue, nausea and AKI.   Felt to be back in NSR with increased po amio started in clinic but long QT so amio held (ECG with actual junctional rhythm)  Was hydrated overnight. Feeling better. No further nausea or ab pain.  Creatinine improved this am but had lung crackles so given IV lasix this am. Now in AF with RVR ~110-120  Objective:   Weight Range:  Vital Signs:   Temp:  [97.6 F (36.4 C)-98.1 F (36.7 C)] 97.6 F (36.4 C) (09/09 1208) Pulse Rate:  [60-108] 108 (09/09 1208) Resp:  [16-24] 23 (09/09 1208) BP: (96-119)/(46-74) 96/66 (09/09 1208) SpO2:  [93 %-97 %] 97 % (09/09 1208) Weight:  [120 lb 13 oz (54.8 kg)-123 lb 14.4 oz (56.2 kg)] 123 lb 14.4 oz (56.2 kg) (09/09 0500)    Weight change: Filed Weights   05/18/17 1522 05/18/17 2215 05/19/17 0500  Weight: 121 lb (54.9 kg) 120 lb 13 oz (54.8 kg) 123 lb 14.4 oz (56.2 kg)    Intake/Output:   Intake/Output Summary (Last 24 hours) at 05/19/17 1226 Last data filed at 05/19/17 1039  Gross per 24 hour  Intake              620 ml  Output              500 ml  Net              120 ml     Physical Exam: General:  Lying in bed  No resp difficulty HEENT: normal Neck: supple. JVP 6 . Carotids 2+ bilat; no bruits. No lymphadenopathy or thryomegaly appreciated. Cor: PMI nondisplaced. Irr IRR tachy  Lungs: minimal basilar crackles  Abdomen: soft, nontender, nondistended. No hepatosplenomegaly. No bruits or masses. Good bowel sounds. Extremities: no cyanosis, clubbing, rash, edema Neuro: alert & orientedx3, cranial nerves grossly intact. moves all 4 extremities w/o difficulty. Affect pleasant  Telemetry:  AF with RVR 110-120 Personally reviewed    Labs: Basic Metabolic Panel:  Recent Labs Lab 05/14/17 1517 05/18/17 1527 05/19/17 0441  NA 129* 128* 129*  K 5.4* 5.0 4.5  CL 94* 96* 97*  CO2 21 17* 21*  GLUCOSE 173*  192* 119*  BUN 19 28* 24*  CREATININE 1.17 1.52* 1.05*  CALCIUM 9.3 9.3 8.7*  MG 1.5  --   --     Liver Function Tests:  Recent Labs Lab 05/14/17 1517 05/19/17 0441  AST 15 19  ALT 39* 20  ALKPHOS 131* 97  BILITOT 0.3 0.7  PROT 6.9 6.6  ALBUMIN 3.7 3.1*   No results for input(s): LIPASE, AMYLASE in the last 168 hours. No results for input(s): AMMONIA in the last 168 hours.  CBC:  Recent Labs Lab 05/14/17 1517 05/18/17 1527 05/19/17 0441  WBC 6.4 9.3 7.1  NEUTROABS 4.7  --   --   HGB 11.8* 11.6* 10.9*  HCT 36.2 35.9* 33.2*  MCV 95.6 92.8 92.0  PLT 514.0* 408* 369    Cardiac Enzymes:  Recent Labs Lab 05/18/17 1527 05/18/17 2320 05/19/17 0441 05/19/17 1001  TROPONINI 0.25* 0.31* 0.25* 0.17*    BNP: BNP (last 3 results)  Recent Labs  05/18/17 1527 05/18/17 2320  BNP 1,219.6* 3,509.1*    ProBNP (last 3 results) No results for input(s): PROBNP in the last 8760 hours.    Other results:  Imaging: Dg Chest 2 View  Result Date:  05/18/2017 CLINICAL DATA:  Post CABG, MVR and clipping of LEFT atrial appendage on 04/23/2017, having chest pain, pain in LEFT arm, nausea, epigastric pressure, onset of symptoms today; history coronary artery disease, diabetes mellitus, CHF, atrial fibrillation EXAM: CHEST  2 VIEW COMPARISON:  05/06/2017 FINDINGS: Upper normal heart size post CABG, MVR and LEFT atrial appendage clipping. Mediastinal contours and pulmonary vascularity normal. Atherosclerotic calcification aorta. Hiatal hernia present. Lungs mildly hyperinflated with central peribronchial thickening. Minimal bibasilar atelectasis. No definite acute infiltrate, pleural effusion or pneumothorax. Bones demineralized with evidence of prior thoracolumbar spinal fixation. IMPRESSION: Post cardiac surgery changes. Bronchitic changes with hyperinflation and mild bibasilar atelectasis. Hiatal hernia. Electronically Signed   By: Lavonia Dana M.D.   On: 05/18/2017 17:10       Medications:     Scheduled Medications: . furosemide      . amiodarone  200 mg Oral BID  . aspirin EC  81 mg Oral Daily  . citalopram  20 mg Oral Daily  . insulin aspart  0-5 Units Subcutaneous QHS  . insulin aspart  0-9 Units Subcutaneous TID WC  . magnesium oxide  400 mg Oral BID  . multivitamin  1 tablet Oral BID  . pantoprazole (PROTONIX) IV  40 mg Intravenous Q12H  . sacubitril-valsartan  1 tablet Oral BID  . trimethoprim  100 mg Oral Daily  . [START ON 05/22/2017] warfarin  1.25 mg Oral Q Wed-1800  . warfarin  2.5 mg Oral Once per day on Sun Mon Tue Thu Fri Sat  . Warfarin - Physician Dosing Inpatient   Does not apply q1800     Infusions: . sodium chloride Stopped (05/19/17 0730)     PRN Medications:  bisacodyl, famotidine, HYDROcodone-acetaminophen, ibuprofen   Assessment:   83 y.o. female with DM2,  MVP s/p bioprosthetic MV replacement and CAD s/p SVG to RCA (CABG) on 04/23/2017. Post-op course c/b by new onset severe LV dysfunction (EF 20%), PAF and shock requiring inotrope support. Converted to NSR on amio. Milrinone weaned prior to d/c. Seen in clinic last week with recurrent AF and amio increased. Admitted 9/8 with LUQ pressure, fatigue, nausea and AKI.    Plan/Discussion:     1. Acute on chronic systolic HF  - Echo 7/79/39 EF 25-30%  - Symptoms and renal function improved with IV hydration. That said, BNP very high. Suspect main issue is low output. She received one dose IV lasix this am and I think volume ok now.  - Will restart milrinone. - Need to get her back into NSR. Start IV amio  - Repeat echo  - Diurese as needed - Ok to continue Entresto as BP tolerates  - no b-blocker  2. MVP s/p bioprosthetic MV replacement and CAD s/p SVG to RCA (CABG) on 04/23/2017.  - Troponin mildly elevated but likely due to HF. Doubt ACS  3. PAF - Was in junctional rhythm on admit. Amio held. Now in AF with RVR - Needs NSR to maintain cardiac output. Resume  IV amio.  - Continue warfarin. INR 2.69  4. DM2 - HgBa1c 5.7 - Continue SSI  5. AKI - improved with hydration. Suspect main issue overall is cardiorenal and low output.   Length of Stay: 1   Bensimhon, Daniel MD 05/19/2017, 12:26 PM  Advanced Heart Failure Team Pager [phone] (M-F; 7a - 4p)  Please contact [facility] for night-coverage after hours (4p -7a ) and weekends on [email or web address]

## 2017-05-20 ENCOUNTER — Inpatient Hospital Stay (HOSPITAL_COMMUNITY): Admission: RE | Admit: 2017-05-20 | Payer: Medicare Other | Source: Ambulatory Visit

## 2017-05-20 DIAGNOSIS — I480 Paroxysmal atrial fibrillation: Secondary | ICD-10-CM

## 2017-05-20 DIAGNOSIS — Z953 Presence of xenogenic heart valve: Secondary | ICD-10-CM

## 2017-05-20 LAB — BASIC METABOLIC PANEL
Anion gap: 8 (ref 5–15)
Anion gap: 10 (ref 5–15)
BUN: 21 mg/dL — AB (ref 6–20)
BUN: 19 mg/dL (ref 6–20)
CALCIUM: 8.8 mg/dL — AB (ref 8.9–10.3)
CHLORIDE: 95 mmol/L — AB (ref 101–111)
CHLORIDE: 96 mmol/L — AB (ref 101–111)
CO2: 26 mmol/L (ref 22–32)
CO2: 24 mmol/L (ref 22–32)
Calcium: 8.7 mg/dL — ABNORMAL LOW (ref 8.9–10.3)
Creatinine, Ser: 0.91 mg/dL (ref 0.44–1.00)
Creatinine, Ser: 0.83 mg/dL (ref 0.44–1.00)
GFR calc Af Amer: >60 mL/min (ref >60)
GFR calc non Af Amer: 58 mL/min — ABNORMAL LOW (ref >60)
GFR calc non Af Amer: >60 mL/min (ref >60)
GLUCOSE: 124 mg/dL — AB (ref 65–99)
GLUCOSE: 133 mg/dL — AB (ref 65–99)
POTASSIUM: 3.9 mmol/L (ref 3.5–5.1)
Potassium: 3.8 mmol/L (ref 3.5–5.1)
SODIUM: 130 mmol/L — AB (ref 135–145)
Sodium: 129 mmol/L — ABNORMAL LOW (ref 135–145)

## 2017-05-20 LAB — GLUCOSE, CAPILLARY
GLUCOSE-CAPILLARY: 160 mg/dL — AB (ref 65–99)
GLUCOSE-CAPILLARY: 67 mg/dL (ref 65–99)
GLUCOSE-CAPILLARY: 107 mg/dL — AB (ref 65–99)
Glucose-Capillary: 130 mg/dL — ABNORMAL HIGH (ref 65–99)
Glucose-Capillary: 94 mg/dL (ref 65–99)

## 2017-05-20 LAB — SODIUM, URINE, RANDOM: Sodium, Ur: 64 mmol/L

## 2017-05-20 LAB — CREATININE, URINE, RANDOM: Creatinine, Urine: 110.21 mg/dL

## 2017-05-20 LAB — PROTIME-INR
INR: 2.97
PROTHROMBIN TIME: 30.7 s — AB (ref 11.4–15.2)

## 2017-05-20 MED ORDER — AMIODARONE HCL IN DEXTROSE 360-4.14 MG/200ML-% IV SOLN
30.0000 mg/h | INTRAVENOUS | Status: DC
  Administered 2017-05-20 – 2017-05-21 (×2): 30 mg/h via INTRAVENOUS
  Filled 2017-05-20 (×3): qty 200

## 2017-05-20 MED ORDER — AMIODARONE HCL IN DEXTROSE 360-4.14 MG/200ML-% IV SOLN
60.0000 mg/h | INTRAVENOUS | Status: AC
  Administered 2017-05-20: 60 mg/h via INTRAVENOUS
  Filled 2017-05-20: qty 200

## 2017-05-20 MED ORDER — WARFARIN - PHARMACIST DOSING INPATIENT
Freq: Every day | Status: DC
  Administered 2017-05-21: 17:00:00

## 2017-05-20 MED ORDER — WARFARIN SODIUM 2.5 MG PO TABS
1.2500 mg | ORAL_TABLET | Freq: Once | ORAL | Status: AC
  Administered 2017-05-20: 1.25 mg via ORAL
  Filled 2017-05-20 (×2): qty 0.5

## 2017-05-20 MED ORDER — AMIODARONE HCL 200 MG PO TABS
200.0000 mg | ORAL_TABLET | Freq: Two times a day (BID) | ORAL | Status: DC
  Administered 2017-05-20: 200 mg via ORAL
  Filled 2017-05-20: qty 1

## 2017-05-20 NOTE — Consult Note (Signed)
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    Clinical Assessment and Goals of Care: Offered family meeting as her husband just left for the day, upon which [name] states she is tired and wants to be at home with her husband, and to have her pain controlled. She states her hospitalizations since her 8/14 CABG have taken her away from her family and she does not want further hospitalizations.  She states her husband and son are not ready to accept this choice to stop and let nature take it's course, and that she has spoken with Dr. Haroldine Laws and agreed to 2-3 more months of treatment and further hospitalizations during that time if needed. She states she is not ready to discuss palliative care or hospice. She does state that she has a living will on file and does not want attempts at resuscitation including chest compressions, shocking, or intubation.  She states that she does not want her family to have to make that decision for her as she did for her parent.   Patient is primary Media planner.     SUMMARY OF RECOMMENDATIONS   DNR status   Code Status/Advance Care Planning:  Full treatment to point of DNR   Prognosis:   < 6 months Depending on cardiac function.   Discharge Planning: Home. Patient declines palliative or hospice care.     Primary Diagnoses: Present on Admission: .  Acute kidney injury ([place]) . Hyperkalemia . Left upper quadrant pain . Generalized weakness . CAD (coronary artery disease) . Junctional rhythm   I have reviewed the medical record, interviewed the patient and family, and examined the patient. The following aspects are pertinent.  Past Medical History:  Diagnosis Date  . Allergy   . Anxiety   . Arthritis   . Atrial enlargement, left   . CHF (congestive heart failure) ([place])   . Coronary artery disease   . Depression   . Diabetes mellitus without complication (HCC)    fasting cbg 110s  . Dyspnea   . GERD (gastroesophageal reflux disease)   . H/O hiatal hernia   . Headache(784.0)   . Heart murmur    06/10/2014 seeing new cardiologist  . Heart murmur   . HOH (hard of hearing)    HOH in left ear; needs to speak to patient in right ear  . Hyperlipidemia   . Hypertension   . Mitral regurgitation   . Mitral valve prolapse   . Palpitations    afib on 03/04/17 EKG  .  PONV (postoperative nausea and vomiting)    Patient stated "I like the patch behind my ear"  . 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 S/P mitral valve replacement with bioprosthetic valve 04/23/2017   [facility] Mitral bovine pericardial tissue valve: Model 7300TFX, Serial [id number], size 31  . UTI (lower urinary tract infection)    Social History   Social History  . Marital status: Married    Spouse name: N/A  . Number of children: N/A  . Years of education: N/A   Occupational History  . Retired Retired   Social History Main Topics  . Smoking status: Never Smoker  . Smokeless tobacco: Never Used  . Alcohol use No  . Drug use: No  . Sexual activity: Not Asked   Other Topics Concern  . None   Social History Narrative   Married   Regular exercise   Family History  Problem Relation Age of Onset  . Heart failure Father   . Arthritis Father   . Hyperlipidemia Father   . Heart disease Father   . Arthritis Mother   .  Hyperlipidemia Mother   . Hypertension Mother    Scheduled Meds: . aspirin EC  81 mg Oral Daily  . citalopram  20 mg Oral Daily  . insulin aspart  0-5 Units Subcutaneous QHS  . insulin aspart  0-9 Units Subcutaneous TID WC  . magnesium oxide  400 mg Oral BID  . multivitamin  1 tablet Oral BID  . sacubitril-valsartan  1 tablet Oral BID  . trimethoprim  100 mg Oral Daily  . warfarin  1.25 mg Oral ONCE-1800  . Warfarin - Pharmacist Dosing Inpatient   Does not apply q1800   Continuous Infusions: . amiodarone 60 mg/hr (05/20/17 1413)   Followed by  . amiodarone    . milrinone 0.125 mcg/kg/min (05/20/17 0000)   PRN Meds:.bisacodyl, famotidine, HYDROcodone-acetaminophen Medications Prior to Admission:  Prior to Admission medications   Medication Sig Start Date End Date Taking? Authorizing Provider  amiodarone (PACERONE) 400 MG tablet Take 1 tablet (400 mg total) by mouth 2 (two) times daily. 05/15/17  Yes Shirley Friar, PA-C  aspirin EC 81 MG EC tablet Take 1 tablet (81 mg total) by mouth daily. 05/07/17  Yes Lars Pinks M, PA-C  bisacodyl (DULCOLAX) 5 MG EC tablet Take 10 mg by mouth daily as needed (for constipation.).   Yes [provider]  citalopram (CELEXA) 20 MG tablet Take 1 tablet (20 mg total) by mouth daily. 02/06/17  Yes Burchette, Alinda Sierras, MD  clonazePAM (KLONOPIN) 0.5 MG tablet Take one tablet by mouth PRN insomnia or anxiety. Patient taking differently: Take 0.5 mg by mouth daily as needed for anxiety. . 05/07/17  Yes Lars Pinks M, PA-C  esomeprazole (NEXIUM) 20 MG capsule Take 20 mg by mouth daily at 12 noon.   Yes [provider]  famotidine (PEPCID) 20 MG tablet Take 20 mg by mouth daily as needed for heartburn or indigestion.   Yes [provider]  fexofenadine (ALLEGRA) 180 MG tablet Take 90 mg by mouth daily as needed for allergies or rhinitis (allergy headache).   Yes [provider]  fluticasone (FLONASE) 50  MCG/ACT nasal spray SHAKE LIQUID AND USE 2 SPRAYS IN EACH NOSTRIL DAILY AS NEEDED FOR ALLERGIES 09/14/16  Yes Burchette, Alinda Sierras, MD  furosemide (LASIX) 20 MG tablet Take 1 tablet (20 mg total) by mouth daily. 05/07/17  Yes Lars Pinks M, PA-C  HYDROcodone-acetaminophen (NORCO/VICODIN) 5-325 MG tablet Take 0.5 tablets by mouth every 6 (six) hours as needed for severe pain. 05/07/17  Yes Lars Pinks M, PA-C  ibuprofen (ADVIL,MOTRIN) 200 MG tablet Take 400 mg by mouth daily as needed for headache or mild pain.    Yes [provider]  magnesium oxide (MAG-OX) 400 (241.3 Mg) MG tablet Take 1 tablet (400 mg total) by mouth 2 (two) times daily. 05/08/17  Yes Lars Pinks M, PA-C  metFORMIN (GLUCOPHAGE) 500 MG tablet Take 2 tablets (1,000 mg total) by mouth 2 (two) times daily with a meal. 03/31/17  Yes End, Harrell Gave, MD  Multiple Vitamins-Minerals (ICAPS AREDS 2) CAPS Take 1 capsule by mouth 2 (two) times daily.   Yes [provider]  Probiotic Product (PROBIOTIC DAILY PO) Take 2 capsules by mouth daily.    Yes [provider]  sacubitril-valsartan (ENTRESTO) 24-26 MG Take 1 tablet by mouth 2 (two) times daily. 05/07/17  Yes Lars Pinks M, PA-C  spironolactone (ALDACTONE) 25 MG tablet Take 0.5 tablets (12.5 mg total) by mouth at bedtime. 05/15/17  Yes Shirley Friar, PA-C  trimethoprim (TRIMPEX) 100 MG tablet TAKE 1 TABLET(100 MG) BY MOUTH DAILY Patient taking differently: TAKE 1 TABLET(100 MG) BY MOUTH in the morning 10/29/16  Yes Burchette, Alinda Sierras, MD  warfarin (COUMADIN) 2.5 MG tablet Take 1 tablet (2.5 mg total) by mouth daily. Or as directed. Patient taking differently: Take 1.25-2.5 mg by mouth daily. Take 1 tablet (2.5 mg) by mouth every Mon, Tue, Thur, Fri, Sat, and Sun; Take 0.5 Tablet (1.25 mg) by mouth on Wed. 05/07/17 05/07/18 Yes Lars Pinks M, PA-C  conjugated estrogens (PREMARIN) vaginal cream Place 1 Applicatorful vaginally  daily. Patient not taking: Reported on 05/18/2017 02/06/17   Eulas Post, MD  [facility] DELICA LANCETS 12I MISC USE TO TEST TWICE DAILY AS DIRECTED 11/13/16   Burchette, Alinda Sierras, MD  ONETOUCH VERIO test strip USE TO CHECK BLOOD GLUCOSE TWICE DAILY AS DIRECTED 05/30/16   Burchette, Alinda Sierras, MD   Allergies  Allergen Reactions  . Cortisone Other (See Comments)    Insomnia, heart palpitations (po only)  . Statins Other (See Comments)    Myalgias   . Codeine Swelling    FACIAL SWELLING SEVERITY UNKNOWN  . Sulfonamide Derivatives     UNSPECIFIED REACTION   . Lactose Intolerance (Gi) Nausea And Vomiting   Review of Systems  Constitutional: Positive for activity change and fatigue.    Physical Exam  Constitutional: She is oriented to person, place, and time. She appears well-developed. No distress.  HENT:  Head: Normocephalic and atraumatic.  Eyes: EOM are normal.  Cardiovascular:  Cardiac monitor in place.   Pulmonary/Chest: Effort normal. No respiratory distress.  Abdominal: Soft.  Neurological: She is alert and oriented to person, place, and time.    Vital Signs: BP 92/62 (BP Location: Left Arm)   Pulse 68   Temp 97.7 F (36.5 C) (Axillary)   Resp 19   Ht 5' 6.5" (1.689 m)   Wt 55.7 kg (122 lb 12.8 oz)   SpO2 96%   BMI 19.52 kg/m  Pain Assessment: No/denies pain POSS *See Group Information*: 1-Acceptable,Awake and alert Pain Score: 0-No pain   SpO2: SpO2: 96 % O2 Device:SpO2: 96 % O2 Flow Rate: .   IO: Intake/output summary:  Intake/Output Summary (Last 24 hours) at 05/20/17 1500 Last data filed at 05/20/17 0800  Gross per 24 hour  Intake  385.93 ml  Output              200 ml  Net           185.93 ml    LBM: Last BM Date: 05/18/17 Baseline Weight: Weight: 54.9 kg (121 lb) Most recent weight: Weight: 55.7 kg (122 lb 12.8 oz)     Palliative Assessment/Data: 60%     Time Total: 70 minutes Greater than 50%  of this time was spent counseling and  coordinating care related to the above assessment and plan.  Signed by: Asencion Gowda, NP 05/20/2017 3:24 PM Office: [phone] 7am-7pm  Call primary team after hours   Please contact Palliative Medicine Team phone at [phone] for questions and concerns.  For individual provider: See Shea Evans

## 2017-05-20 NOTE — Progress Notes (Signed)
Advanced Heart Failure Rounding Note   Subjective:    Admitted last night for progressive fatigue, nausea and AKI.   Felt to be back in NSR with increased po amio started in clinic but long QT so amio held (ECG with actual junctional rhythm).  Feeling better this am. Good UO with lasix (has purewick in place). She is very clear that she wishes to avoid any further hospitalizations and spend time at home with her family. Nausea and abdominal pain improgved.   Creatinine stable. Converted to NSR ~ 0245 am.   Objective:   Weight Range:  Vital Signs:   Temp:  [97.6 F (36.4 C)-98.3 F (36.8 C)] 97.7 F (36.5 C) (09/10 0833) Pulse Rate:  [51-116] 78 (09/10 0833) Resp:  [15-27] 19 (09/10 0454) BP: (89-111)/(55-85) 97/55 (09/10 0833) SpO2:  [96 %-98 %] 96 % (09/10 0833) Weight:  [122 lb 12.8 oz (55.7 kg)] 122 lb 12.8 oz (55.7 kg) (09/10 0454)    Weight change: Filed Weights   05/18/17 2215 05/19/17 0500 05/20/17 0454  Weight: 120 lb 13 oz (54.8 kg) 123 lb 14.4 oz (56.2 kg) 122 lb 12.8 oz (55.7 kg)    Intake/Output:   Intake/Output Summary (Last 24 hours) at 05/20/17 0845 Last data filed at 05/20/17 0000  Gross per 24 hour  Intake           422.93 ml  Output              500 ml  Net           -77.07 ml     Physical Exam: General: Lying in bed. Chronically ill and fatigued appearing. No resp difficulty. HEENT: Normal Neck: Supple. JVP 7-8 cm. Carotids 2+ bilat; no bruits. No thyromegaly or nodule noted. Cor: PMI nondisplaced. RRR, No M/G/R noted Lungs: CTAB, normal effort. Abdomen: Soft, non-tender, non-distended, no HSM. No bruits or masses. +BS  Extremities: No cyanosis, clubbing, rash, R and LLE no edema.  Neuro: Alert & orientedx3, cranial nerves grossly intact. moves all 4 extremities w/o difficulty. Affect pleasant   Telemetry:  NSR 70s, Converted from Afib ~ 0245 am. Personally reviewed.   Labs: Basic Metabolic Panel:  Recent Labs Lab 05/14/17 1517  05/18/17 1527 05/19/17 0441 05/19/17 1742 05/20/17 0332  NA 129* 128* 129* 127* 129*  K 5.4* 5.0 4.5 4.0 3.8  CL 94* 96* 97* 93* 95*  CO2 21 17* 21* 23 26  GLUCOSE 173* 192* 119* 271* 124*  BUN 19 28* 24* 20 21*  CREATININE 1.17 1.52* 1.05* 1.05* 0.91  CALCIUM 9.3 9.3 8.7* 8.8* 8.8*  MG 1.5  --   --   --   --     Liver Function Tests:  Recent Labs Lab 05/14/17 1517 05/19/17 0441  AST 15 19  ALT 39* 20  ALKPHOS 131* 97  BILITOT 0.3 0.7  PROT 6.9 6.6  ALBUMIN 3.7 3.1*   No results for input(s): LIPASE, AMYLASE in the last 168 hours. No results for input(s): AMMONIA in the last 168 hours.  CBC:  Recent Labs Lab 05/14/17 1517 05/18/17 1527 05/19/17 0441  WBC 6.4 9.3 7.1  NEUTROABS 4.7  --   --   HGB 11.8* 11.6* 10.9*  HCT 36.2 35.9* 33.2*  MCV 95.6 92.8 92.0  PLT 514.0* 408* 369    Cardiac Enzymes:  Recent Labs Lab 05/18/17 1527 05/18/17 2320 05/19/17 0441 05/19/17 1001  TROPONINI 0.25* 0.31* 0.25* 0.17*    BNP: BNP (last 3 results)  Recent Labs  05/18/17 1527 05/18/17 2320  BNP 1,219.6* 3,509.1*    ProBNP (last 3 results) No results for input(s): PROBNP in the last 8760 hours.    Other results:  Imaging: Dg Chest 2 View  Result Date: 05/18/2017 CLINICAL DATA:  Post CABG, MVR and clipping of LEFT atrial appendage on 04/23/2017, having chest pain, pain in LEFT arm, nausea, epigastric pressure, onset of symptoms today; history coronary artery disease, diabetes mellitus, CHF, atrial fibrillation EXAM: CHEST  2 VIEW COMPARISON:  05/06/2017 FINDINGS: Upper normal heart size post CABG, MVR and LEFT atrial appendage clipping. Mediastinal contours and pulmonary vascularity normal. Atherosclerotic calcification aorta. Hiatal hernia present. Lungs mildly hyperinflated with central peribronchial thickening. Minimal bibasilar atelectasis. No definite acute infiltrate, pleural effusion or pneumothorax. Bones demineralized with evidence of prior  thoracolumbar spinal fixation. IMPRESSION: Post cardiac surgery changes. Bronchitic changes with hyperinflation and mild bibasilar atelectasis. Hiatal hernia. Electronically Signed   By: Lavonia Dana M.D.   On: 05/18/2017 17:10     Medications:     Scheduled Medications: . aspirin EC  81 mg Oral Daily  . citalopram  20 mg Oral Daily  . insulin aspart  0-5 Units Subcutaneous QHS  . insulin aspart  0-9 Units Subcutaneous TID WC  . magnesium oxide  400 mg Oral BID  . multivitamin  1 tablet Oral BID  . pantoprazole (PROTONIX) IV  40 mg Intravenous Q12H  . sacubitril-valsartan  1 tablet Oral BID  . trimethoprim  100 mg Oral Daily  . [START ON 05/22/2017] warfarin  1.25 mg Oral Q Wed-1800  . warfarin  2.5 mg Oral Once per day on Sun Mon Tue Thu Fri Sat  . Warfarin - Physician Dosing Inpatient   Does not apply q1800    Infusions: . milrinone 0.125 mcg/kg/min (05/20/17 0000)    PRN Medications: bisacodyl, famotidine, HYDROcodone-acetaminophen   Assessment:   83 y.o. female with DM2,  MVP s/p bioprosthetic MV replacement and CAD s/p SVG to RCA (CABG) on 04/23/2017. Post-op course c/b by new onset severe LV dysfunction (EF 20%), PAF and shock requiring inotrope support. Converted to NSR on amio. Milrinone weaned prior to d/c. Seen in clinic last week with recurrent AF and amio increased. Admitted 9/8 with LUQ pressure, fatigue, nausea and AKI.    Plan/Discussion:     1. Acute on chronic systolic HF  - Echo 7/67/34 EF 25-30%  - Symptoms and renal function improved with IV hydration. That said, BNP very high on admit. Suspect main issue is low output.  - Continue milrinone 0.125 mcg/kg/min.  - She is back in NSR this am after bolus of IV amio yesterday.  - Repeat echo pending.  - Diurese as needed - Ok to continue Entresto as BP tolerates  - No b-blocker with low output.   2. MVP s/p bioprosthetic MV replacement and CAD s/p SVG to RCA (CABG) on 04/23/2017.  - Troponin mildly elevated  but likely due to HF. No s/s of ACS.   3. PAF - Was in junctional rhythm on admit. Amio held. Then in AF with RVR. She has now converted to NSR after bolus of IV amio.  - Continue warfarin. INR 2.97.  4. DM2 - HgBa1c 5.7 - Continue SSI.   5. AKI - Improved with hydration. Suspect primarily cardiorenal/low output.   6. Disposition - She tells me that she wishes to avoid further hospitalization and wants to let things "run their course" and primarily be treated as an outpatient.  She  has agreed to a palliative care consult.   Continue milrinone. Restart amiodarone. Disposition pending palliative care consult. Pt would be appropriate for hospice, but if needs milrinone for palliation, may need palliative care at home. This was discussed with her today.   Length of Stay: 2  [name]  05/20/2017, 8:45 AM  Advanced Heart Failure Team Pager [phone] (M-F; 7a - 4p)  Please contact [facility] for night-coverage after hours (4p -7a ) and weekends on [email or web address]  Patient seen and examined with the above-signed Advanced Practice Provider and/or Housestaff. I personally reviewed laboratory data, imaging studies and relevant notes. I independently examined the patient and formulated the important aspects of the plan. I have edited the note to reflect any of my changes or salient points. I have personally discussed the plan with the patient and/or family.  Now on milrinone. Feeling better. Has diuresed well. Has also converted back to NSR.   Suspect she had recurrent low output state in setting of PAF. It will be important to maintain NSR. Will continue IV amio for at least another 24 hours.   She is asking about Palliatvie Care but I told her that I suspect her LV function may recover over the next 2-3 months. Will repeat echo today.  Glori Bickers, MD  1:28 PM

## 2017-05-20 NOTE — Progress Notes (Signed)
ANTICOAGULATION CONSULT NOTE - Initial Consult  Pharmacy Consult for warfarin Indication: atrial fibrillation  Allergies  Allergen Reactions  . Cortisone Other (See Comments)    Insomnia, heart palpitations (po only)  . Statins Other (See Comments)    Myalgias   . Codeine Swelling    FACIAL SWELLING SEVERITY UNKNOWN  . Sulfonamide Derivatives     UNSPECIFIED REACTION   . Lactose Intolerance (Gi) Nausea And Vomiting    Patient Measurements: Height: 5' 6.5" (168.9 cm) Weight: 122 lb 12.8 oz (55.7 kg) IBW/kg (Calculated) : 60.45  Vital Signs: Temp: 97.7 F (36.5 C) (09/10 0833) Temp Source: Axillary (09/10 0833) BP: 92/62 (09/10 1049) Pulse Rate: 68 (09/10 1049)  Labs:  Recent Labs  05/18/17 1527 05/18/17 2320 05/19/17 0441 05/19/17 1001 05/19/17 1742 05/20/17 0332 05/20/17 0751  HGB 11.6*  --  10.9*  --   --   --   --   HCT 35.9*  --  33.2*  --   --   --   --   PLT 408*  --  369  --   --   --   --   LABPROT  --  28.4*  --   --   --  30.7*  --   INR  --  2.69  --   --   --  2.97  --   CREATININE 1.52*  --  1.05*  --  1.05* 0.91 0.83  TROPONINI 0.25* 0.31* 0.25* 0.17*  --   --   --     Estimated Creatinine Clearance: 46.7 mL/min (by C-G formula based on SCr of 0.83 mg/dL).   Medical History: Past Medical History:  Diagnosis Date  . Allergy   . Anxiety   . Arthritis   . Atrial enlargement, left   . CHF (congestive heart failure) ([place])   . Coronary artery disease   . Depression   . Diabetes mellitus without complication (HCC)    fasting cbg 110s  . Dyspnea   . GERD (gastroesophageal reflux disease)   . H/O hiatal hernia   . Headache(784.0)   . Heart murmur    06/10/2014 seeing new cardiologist  . Heart murmur   . HOH (hard of hearing)    HOH in left ear; needs to speak to patient in right ear  . Hyperlipidemia   . Hypertension   . Mitral regurgitation   . Mitral valve prolapse   . Palpitations    afib on 03/04/17 EKG  . PONV (postoperative  nausea and vomiting)    Patient stated "I like the patch behind my ear"  . 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 S/P mitral valve replacement with bioprosthetic valve 04/23/2017   [facility] Mitral bovine pericardial tissue valve: Model 7300TFX, Serial [id number], size 31  . UTI (lower urinary tract infection)     Medications:  Scheduled:  . aspirin EC  81 mg Oral Daily  . citalopram  20 mg Oral Daily  . insulin aspart  0-5 Units Subcutaneous QHS  . insulin aspart  0-9 Units Subcutaneous TID WC  . magnesium oxide  400 mg Oral BID  . multivitamin  1 tablet Oral BID  . sacubitril-valsartan  1 tablet Oral BID  . trimethoprim  100 mg Oral Daily  . warfarin  1.25 mg Oral ONCE-1800  . Warfarin - Pharmacist Dosing Inpatient   Does not apply q1800    Assessment: 83 y/o female  on warfarin PTA for persistent Afib, admitted for nausea/fatigue found to be in Afib. INR today is 2.97, which is supratherapeutic given lower INR goal. It appears a dose was missed on 9/8. Of note, PTA amiodarone was recently (9/5) increased to 400 mg BID and she continues on IV amiodarone inpatient. Increased amiodarone may contribute to supratherapeutic INR. CBC stable, no bleeding noted.  Home dose: 2.5 mg daily except 1.25 mg on Wednesdays  Goal of Therapy:  INR 2.0 - 2.5 Monitor platelets by anticoagulation protocol: Yes   Plan:  Warfarin 1.25 mg today x 1 Daily INR Monitor s/sx of bleeding   Charlene Brooke, PharmD PGY1 [facility] Resident Pager: [phone] After 4:00PM please call [place] [phone] 05/20/2017,1:50 PM

## 2017-05-21 ENCOUNTER — Inpatient Hospital Stay (HOSPITAL_COMMUNITY): Payer: Medicare Other

## 2017-05-21 DIAGNOSIS — I361 Nonrheumatic tricuspid (valve) insufficiency: Secondary | ICD-10-CM

## 2017-05-21 LAB — BASIC METABOLIC PANEL
Anion gap: 7 (ref 5–15)
BUN: 14 mg/dL (ref 6–20)
CALCIUM: 8.6 mg/dL — AB (ref 8.9–10.3)
CO2: 26 mmol/L (ref 22–32)
CREATININE: 0.81 mg/dL (ref 0.44–1.00)
Chloride: 96 mmol/L — ABNORMAL LOW (ref 101–111)
Glucose, Bld: 135 mg/dL — ABNORMAL HIGH (ref 65–99)
Potassium: 3.7 mmol/L (ref 3.5–5.1)
SODIUM: 129 mmol/L — AB (ref 135–145)

## 2017-05-21 LAB — ECHOCARDIOGRAM COMPLETE
HEIGHTINCHES: 66.5 in
WEIGHTICAEL: 1960 [oz_av]

## 2017-05-21 LAB — PROTIME-INR
INR: 2.7
Prothrombin Time: 28.5 seconds — ABNORMAL HIGH (ref 11.4–15.2)

## 2017-05-21 LAB — GLUCOSE, CAPILLARY
GLUCOSE-CAPILLARY: 140 mg/dL — AB (ref 65–99)
GLUCOSE-CAPILLARY: 115 mg/dL — AB (ref 65–99)
Glucose-Capillary: 139 mg/dL — ABNORMAL HIGH (ref 65–99)
Glucose-Capillary: 103 mg/dL — ABNORMAL HIGH (ref 65–99)

## 2017-05-21 MED ORDER — AMIODARONE HCL 200 MG PO TABS
200.0000 mg | ORAL_TABLET | Freq: Two times a day (BID) | ORAL | Status: DC
  Administered 2017-05-21 – 2017-05-22 (×2): 200 mg via ORAL
  Filled 2017-05-21 (×2): qty 1

## 2017-05-21 MED ORDER — WARFARIN SODIUM 2.5 MG PO TABS
1.2500 mg | ORAL_TABLET | Freq: Once | ORAL | Status: AC
  Administered 2017-05-21: 1.25 mg via ORAL
  Filled 2017-05-21: qty 0.5

## 2017-05-21 MED ORDER — RANOLAZINE ER 500 MG PO TB12
500.0000 mg | ORAL_TABLET | Freq: Two times a day (BID) | ORAL | Status: DC
  Administered 2017-05-21 (×2): 500 mg via ORAL
  Filled 2017-05-21 (×2): qty 1

## 2017-05-21 NOTE — Progress Notes (Signed)
Advanced Heart Failure Rounding Note   Subjective:    Admitted last night for progressive fatigue, nausea and AKI. Back in Afib on admit after amio held as outpatient with prolonged QT.   Discussed with palliative care and MD yesterday. She wants to continue "aggressive therapy" and medication adjustment for at least 2-3 months while awaiting LV improvement.   She has decided on DNR status.   Feeling OK this am. Slept alright. Frustrated about being in the hospital and wants to go home. Denies SOB or lightheadedness getting up to chair.   Creatinine stable. Remains in NSR on IV amio.   Objective:   Weight Range:  Vital Signs:   Temp:  [98.3 F (36.8 C)-98.5 F (36.9 C)] 98.5 F (36.9 C) (09/11 0425) Pulse Rate:  [66-69] 67 (09/11 0425) Resp:  [12-16] 12 (09/11 0425) BP: (92-106)/(56-65) 106/65 (09/11 0425) SpO2:  [95 %-97 %] 96 % (09/11 0425) Weight:  [122 lb 6.4 oz (55.5 kg)-122 lb 8 oz (55.6 kg)] 122 lb 8 oz (55.6 kg) (09/11 0548) Last BM Date: 05/18/17  Weight change: Filed Weights   05/20/17 0454 05/21/17 0425 05/21/17 0548  Weight: 122 lb 12.8 oz (55.7 kg) 122 lb 6.4 oz (55.5 kg) 122 lb 8 oz (55.6 kg)    Intake/Output:   Intake/Output Summary (Last 24 hours) at 05/21/17 0901 Last data filed at 05/21/17 0650  Gross per 24 hour  Intake           787.65 ml  Output              200 ml  Net           587.65 ml     Physical Exam: General: Elderly and chronically ill appearing. NAD.  HEENT: Normal Neck: Supple. JVP 7-8 cm. Carotids 2+ bilat; no bruits. No thyromegaly or nodule noted. Cor: PMI nondisplaced. RRR, No M/G/R noted Lungs: CTAB, normal effort. Abdomen: Soft, non-tender, non-distended, no HSM. No bruits or masses. +BS  Extremities: No cyanosis, clubbing, rash, R and LLE no edema.  Neuro: Alert & orientedx3, cranial nerves grossly intact. moves all 4 extremities w/o difficulty. Affect pleasant   Telemetry:  NSR 60s, Personally reviewed.    Labs: Basic Metabolic Panel:  Recent Labs Lab 05/14/17 1517  05/19/17 0441 05/19/17 1742 05/20/17 0332 05/20/17 0751 05/21/17 0341  NA 129*  < > 129* 127* 129* 130* 129*  K 5.4*  < > 4.5 4.0 3.8 3.9 3.7  CL 94*  < > 97* 93* 95* 96* 96*  CO2 21  < > 21* 23 26 24 26   GLUCOSE 173*  < > 119* 271* 124* 133* 135*  BUN 19  < > 24* 20 21* 19 14  CREATININE 1.17  < > 1.05* 1.05* 0.91 0.83 0.81  CALCIUM 9.3  < > 8.7* 8.8* 8.8* 8.7* 8.6*  MG 1.5  --   --   --   --   --   --   < > = values in this interval not displayed.  Liver Function Tests:  Recent Labs Lab 05/14/17 1517 05/19/17 0441  AST 15 19  ALT 39* 20  ALKPHOS 131* 97  BILITOT 0.3 0.7  PROT 6.9 6.6  ALBUMIN 3.7 3.1*   No results for input(s): LIPASE, AMYLASE in the last 168 hours. No results for input(s): AMMONIA in the last 168 hours.  CBC:  Recent Labs Lab 05/14/17 1517 05/18/17 1527 05/19/17 0441  WBC 6.4 9.3 7.1  NEUTROABS 4.7  --   --  HGB 11.8* 11.6* 10.9*  HCT 36.2 35.9* 33.2*  MCV 95.6 92.8 92.0  PLT 514.0* 408* 369    Cardiac Enzymes:  Recent Labs Lab 05/18/17 1527 05/18/17 2320 05/19/17 0441 05/19/17 1001  TROPONINI 0.25* 0.31* 0.25* 0.17*    BNP: BNP (last 3 results)  Recent Labs  05/18/17 1527 05/18/17 2320  BNP 1,219.6* 3,509.1*    ProBNP (last 3 results) No results for input(s): PROBNP in the last 8760 hours.    Other results:  Imaging: No results found.   Medications:     Scheduled Medications: . aspirin EC  81 mg Oral Daily  . citalopram  20 mg Oral Daily  . insulin aspart  0-5 Units Subcutaneous QHS  . insulin aspart  0-9 Units Subcutaneous TID WC  . magnesium oxide  400 mg Oral BID  . multivitamin  1 tablet Oral BID  . sacubitril-valsartan  1 tablet Oral BID  . trimethoprim  100 mg Oral Daily  . Warfarin - Pharmacist Dosing Inpatient   Does not apply q1800    Infusions: . amiodarone 30 mg/hr (05/21/17 0643)  . milrinone 0.125 mcg/kg/min (05/20/17  2255)    PRN Medications: bisacodyl, famotidine, HYDROcodone-acetaminophen   Assessment:   83 y.o. female with DM2,  MVP s/p bioprosthetic MV replacement and CAD s/p SVG to RCA (CABG) on 04/23/2017. Post-op course c/b by new onset severe LV dysfunction (EF 20%), PAF and shock requiring inotrope support. Converted to NSR on amio. Milrinone weaned prior to d/c. Seen in clinic last week with recurrent AF and amio increased. Admitted 9/8 with LUQ pressure, fatigue, nausea and AKI.    Plan/Discussion:     1. Acute on chronic systolic HF  - Echo 09/18/30 EF 25-30%. Repeat Echo pending s/p MVR and CABG. - Symptoms and renal function improved with IV hydration. That said, BNP very high on admit. Suspect main issue is low output.  - Continue milrinone 0.125 mcg/kg/min through the day.  - remains in NSR on IV amio.  - Volume status stable off diuretics for now.  - Ok to continue [place] as BP tolerates. Would not increase with pressures in 90-100s. - No b-blocker with low output.   2. MVP s/p bioprosthetic MV replacement and CAD s/p SVG to RCA (CABG) on 04/23/2017.  - Troponin mildly elevated but likely due to HF.  - No s/s of ACS.   3. PAF - Remains in NSR back on amio gtt. Will start back on po once this bag runs out. Will have to follow electrolytes and QT interval closely.  - Will check EKG tomorrow am once switched to po amio.  - Continue warfarin. INR 2.70. Dosing per pharmacy.   4. DM2 - HgBa1c 5.7 - Continue SSI.   5. AKI - Resolved with hydration. Suspect primarily cardiorenal/low output exacerbated by recurrent Afib.   6. Hyponatremia - Na 129 today. Continue to follow. Limit free water.   7. Disposition - She is now DNR. Wants to continue medication titration for 2-3 months then re-check Echo for LV recovery. If no improvement, she would not want to escalate care pass this point.   Length of Stay: 3  [name]  05/21/2017, 9:01 AM  Advanced Heart  Failure Team Pager [phone] (M-F; 7a - 4p)  Please contact [facility] for night-coverage after hours (4p -7a ) and weekends on [email or web address]  Patient seen and examined with the above-signed Advanced Practice Provider and/or Housestaff. I personally reviewed laboratory data, imaging studies and relevant  notes. I independently examined the patient and formulated the important aspects of the plan. I have edited the note to reflect any of my changes or salient points. I have personally discussed the plan with the patient and/or family.  She remains on milrinone. Feels weak. She is back in NSR today on amiodarone.   I reviewed echo today personally. EF much improved. Now ~40%.   Will attempt to wean milrinone. Long talk about prognosis. I am hopeful she will make full recovery. CR to see.   Glori Bickers, MD  3:52 PM

## 2017-05-21 NOTE — Progress Notes (Signed)
  Echocardiogram 2D Echocardiogram has been performed.  [name] M 05/21/2017, 10:57 AM

## 2017-05-21 NOTE — Progress Notes (Signed)
Inpatient Diabetes Program Recommendations  AACE/ADA: New Consensus Statement on Inpatient Glycemic Control (2015)  Target Ranges:  Prepandial:   less than 140 mg/dL      Peak postprandial:   less than 180 mg/dL (1-2 hours)      Critically ill patients:  140 - 180 mg/dL   Lab Results  Component Value Date   GLUCAP 139 (H) 05/21/2017   HGBA1C 5.9 (H) 05/18/2017    Review of Glycemic Control  Results for ERCILIA, BETTINGER (MRN [id number]) as of 05/21/2017 12:38  Ref. Range 05/20/2017 16:40 05/20/2017 21:08 05/20/2017 22:49 05/21/2017 05:55 05/21/2017 11:45  Glucose-Capillary Latest Ref Range: 65 - 99 mg/dL 160 (H) 67 107 (H) 140 (H) 139 (H)    Diabetes history: Type 2 Outpatient Diabetes medications: Metformin 1000mg  bid Current orders for Inpatient glycemic control: Novolog 0-9 units tid, Novolog 0-5 units qhs,   Inpatient Diabetes Program Recommendations: Agree with current medications for blood sugar management.   Gentry Fitz, RN, BA, MHA, CDE Diabetes Coordinator Inpatient Diabetes Program  [phone] (Team Pager) [phone] ([place]) 05/21/2017 12:57 PM

## 2017-05-21 NOTE — Care Management Note (Signed)
Case Management Note Marvetta Gibbons RN, BSN Unit 4E-Case Manager [phone]  Patient Details  Name: [name] MRN: [id number] Date of Birth: [birth year]  Subjective/Objective:  Pt admitted with acute on chronic HF                  Action/Plan: PTA pt lived at home with spouse- was active with [facility] for HHRN/PT- will need resumption orders for discharge- CM to follow for d/c needs  Expected Discharge Date:                  Expected Discharge Plan:  [place]  In-House Referral:     Discharge planning Services  CM Consult  Post Acute Care Choice:  Home Health, Resumption of Svcs/PTA Provider Choice offered to:  Patient  DME Arranged:    DME Agency:     HH Arranged:  RN, PT [place] Agency:  [facility] (formerly [facility])  Status of Service:  In process, will continue to follow  If discussed at Long Length of Stay Meetings, dates discussed:    Discharge Disposition:   Additional Comments:  Dawayne Patricia, RN 05/21/2017, 4:03 PM

## 2017-05-21 NOTE — Progress Notes (Signed)
ANTICOAGULATION CONSULT NOTE - Follow Up Consult  Pharmacy Consult for warfarin Indication: atrial fibrillation  Allergies  Allergen Reactions  . Cortisone Other (See Comments)    Insomnia, heart palpitations (po only)  . Statins Other (See Comments)    Myalgias   . Codeine Swelling    FACIAL SWELLING SEVERITY UNKNOWN  . Sulfonamide Derivatives     UNSPECIFIED REACTION   . Lactose Intolerance (Gi) Nausea And Vomiting    Patient Measurements: Height: 5' 6.5" (168.9 cm) Weight: 122 lb 8 oz (55.6 kg) IBW/kg (Calculated) : 60.45   Vital Signs: Temp: 98.5 F (36.9 C) (09/11 0425) Temp Source: Oral (09/11 0425) BP: 106/65 (09/11 0425) Pulse Rate: 67 (09/11 0425)  Labs:  Recent Labs  05/18/17 1527 05/18/17 2320 05/19/17 0441 05/19/17 1001  05/20/17 0332 05/20/17 0751 05/21/17 0341  HGB 11.6*  --  10.9*  --   --   --   --   --   HCT 35.9*  --  33.2*  --   --   --   --   --   PLT 408*  --  369  --   --   --   --   --   LABPROT  --  28.4*  --   --   --  30.7*  --  28.5*  INR  --  2.69  --   --   --  2.97  --  2.70  CREATININE 1.52*  --  1.05*  --   < > 0.91 0.83 0.81  TROPONINI 0.25* 0.31* 0.25* 0.17*  --   --   --   --   < > = values in this interval not displayed.  Estimated Creatinine Clearance: 47.8 mL/min (by C-G formula based on SCr of 0.81 mg/dL).   Medications:  Scheduled:  . amiodarone  200 mg Oral BID  . aspirin EC  81 mg Oral Daily  . citalopram  20 mg Oral Daily  . insulin aspart  0-5 Units Subcutaneous QHS  . insulin aspart  0-9 Units Subcutaneous TID WC  . magnesium oxide  400 mg Oral BID  . multivitamin  1 tablet Oral BID  . sacubitril-valsartan  1 tablet Oral BID  . trimethoprim  100 mg Oral Daily  . warfarin  1.25 mg Oral ONCE-1800  . Warfarin - Pharmacist Dosing Inpatient   Does not apply q1800    Assessment: 83 y/o female continues on warfarin for persistent Afib, now in NSR on IV amiodarone. INR today is trending down, 2.7 today,  however remains supratherapeutic given lower INR goal. No bleeding noted. Last CBC was low, stable.  Home dose: 2.5 mg daily except 1.25 mg on Wednesdays  Goal of Therapy:  INR 2-2.5 Monitor platelets by anticoagulation protocol: Yes   Plan:  Warfarin 1.25 mg x 1 Daily INR Monitor CBC, s/sx of bleeding   Charlene Brooke, PharmD PGY1 [facility] Resident Pager: [phone] After 4:00PM please call [facility] Pharm[facility] [phone] 05/21/2017,9:34 AM

## 2017-05-22 ENCOUNTER — Telehealth (HOSPITAL_COMMUNITY): Payer: Self-pay | Admitting: Pharmacist

## 2017-05-22 LAB — CBC
HEMATOCRIT: 33.1 % — AB (ref 36.0–46.0)
Hemoglobin: 10.6 g/dL — ABNORMAL LOW (ref 12.0–15.0)
MCH: 29.9 pg (ref 26.0–34.0)
MCHC: 32 g/dL (ref 30.0–36.0)
MCV: 93.2 fL (ref 78.0–100.0)
PLATELETS: 290 10*3/uL (ref 150–400)
RBC: 3.55 MIL/uL — ABNORMAL LOW (ref 3.87–5.11)
RDW: 15.7 % — AB (ref 11.5–15.5)
WBC: 6.6 10*3/uL (ref 4.0–10.5)

## 2017-05-22 LAB — BASIC METABOLIC PANEL
Anion gap: 6 (ref 5–15)
BUN: 11 mg/dL (ref 6–20)
CHLORIDE: 98 mmol/L — AB (ref 101–111)
CO2: 27 mmol/L (ref 22–32)
CREATININE: 0.9 mg/dL (ref 0.44–1.00)
Calcium: 8.5 mg/dL — ABNORMAL LOW (ref 8.9–10.3)
GFR calc Af Amer: >60 mL/min (ref >60)
GFR, EST NON AFRICAN AMERICAN: 58 mL/min — AB (ref >60)
GLUCOSE: 119 mg/dL — AB (ref 65–99)
Potassium: 4.4 mmol/L (ref 3.5–5.1)
SODIUM: 131 mmol/L — AB (ref 135–145)

## 2017-05-22 LAB — GLUCOSE, CAPILLARY: Glucose-Capillary: 127 mg/dL — ABNORMAL HIGH (ref 65–99)

## 2017-05-22 LAB — PROTIME-INR
INR: 2.03
Prothrombin Time: 22.8 seconds — ABNORMAL HIGH (ref 11.4–15.2)

## 2017-05-22 MED ORDER — WARFARIN SODIUM 2.5 MG PO TABS: 2.5000 mg | ORAL_TABLET | Freq: Once | ORAL | Status: DC

## 2017-05-22 MED ORDER — AMIODARONE HCL 400 MG PO TABS: 400.0000 mg | ORAL_TABLET | Freq: Two times a day (BID) | ORAL | 6 refills | Status: DC

## 2017-05-22 NOTE — Telephone Encounter (Signed)
Amiodarone 400 mg BID PA approved by [employer] through 09/09/17.   [name]. Velva Harman, PharmD, BCPS, CPP Clinical Pharmacist Pager: [phone] Phone: [phone] 05/22/2017 12:54 PM

## 2017-05-22 NOTE — Discharge Summary (Signed)
Advanced Heart Failure Team  Discharge Summary   Patient ID: [name] MRN: [id number], DOB/AGE: Feb 09, 1934 83 y.o. Admit date: 05/18/2017 D/C date:     05/22/2017   Primary Discharge Diagnoses:  1. A/C Systolic Heart Failure 2. MVP S/P Bioprosthetic MV replacement and CAD S/P SVG to RCA on 04/23/2017 3. PAF 4. DMII  5. AKI 6. Hyponatremia 7. DNR  Hospital Course:  83 y.o.femalewith DM2,  MVP s/p bioprosthetic MV replacement and CAD s/p SVG to RCA (CABG) on 04/23/2017. Post-op course c/b by new onset severe LV dysfunction (EF 20%), PAF and shock requiring inotrope support. Converted to NSR on amio. Milrinone weaned prior to d/c. Seen in clinic last week with recurrent AF and amio increased.   Admitted 9/8 with LUQ pressure, fatigue, nausea and AKI. Creatinine on admit was 1.5.  Diuretics held and she was gently hydrated. IV fluids stopped and renal function improved. Briefly placed on milrinone but later stopped.  Plan to keep off spiro and lasix.   Also went back in A fib RVR so amio drip was restarted with chemical conversion to NSR. Amio drip stopped and transitioned to po amio 400 mg twice a day. Plan to taper amio at her follow up appointment. She continued on coumadin with stable INR on the day of discharge. She will follow up [facility] next week. She will follow up in the HF clinic next week. Plan to check an EKG and renal function at that time. [place] will follow for HHRN/HHPT.   Discharge Weight: 121 pounds Discharge Vitals: Blood pressure 93/64, pulse (!) 58, temperature 97.9 F (36.6 C), temperature source Oral, resp. rate (!) 22, height 5' 6.5" (1.689 m), weight 121 lb 3.2 oz (55 kg), SpO2 96 %.  Labs: Lab Results  Component Value Date   WBC 6.6 05/22/2017   HGB 10.6 (L) 05/22/2017   HCT 33.1 (L) 05/22/2017   MCV 93.2 05/22/2017   PLT 290 05/22/2017    Recent Labs Lab 05/19/17 0441  05/22/17 0323  NA 129*  < > 131*  K 4.5  < > 4.4  CL  97*  < > 98*  CO2 21*  < > 27  BUN 24*  < > 11  CREATININE 1.05*  < > 0.90  CALCIUM 8.7*  < > 8.5*  PROT 6.6  --   --   BILITOT 0.7  --   --   ALKPHOS 97  --   --   ALT 20  --   --   AST 19  --   --   GLUCOSE 119*  < > 119*  < > = values in this interval not displayed. Lab Results  Component Value Date   CHOL 163 08/22/2016   HDL 65 08/22/2016   LDLCALC 78 08/22/2016   TRIG 101 08/22/2016   BNP (last 3 results)  Recent Labs  05/18/17 1527 05/18/17 2320  BNP 1,219.6* 3,509.1*    ProBNP (last 3 results) No results for input(s): PROBNP in the last 8760 hours.   Diagnostic Studies/Procedures   No results found.  Discharge Medications   Allergies as of 05/22/2017      Reactions   Cortisone Other (See Comments)   Insomnia, heart palpitations (po only)   Statins Other (See Comments)   Myalgias   Codeine Swelling   FACIAL SWELLING SEVERITY UNKNOWN   Sulfonamide Derivatives    UNSPECIFIED REACTION    Lactose Intolerance (gi) Nausea And Vomiting  Medication List    STOP taking these medications   furosemide 20 MG tablet Commonly known as:  LASIX   ibuprofen 200 MG tablet Commonly known as:  ADVIL,MOTRIN   spironolactone 25 MG tablet Commonly known as:  ALDACTONE     TAKE these medications   amiodarone 400 MG tablet Commonly known as:  PACERONE Take 1 tablet (400 mg total) by mouth 2 (two) times daily.   aspirin 81 MG EC tablet Take 1 tablet (81 mg total) by mouth daily.   bisacodyl 5 MG EC tablet Commonly known as:  DULCOLAX Take 10 mg by mouth daily as needed (for constipation.).   citalopram 20 MG tablet Commonly known as:  CELEXA Take 1 tablet (20 mg total) by mouth daily.   clonazePAM 0.5 MG tablet Commonly known as:  KLONOPIN Take one tablet by mouth PRN insomnia or anxiety. What changed:  how much to take  how to take this  when to take this  reasons to take this  additional instructions   conjugated estrogens vaginal  cream Commonly known as:  PREMARIN Place 1 Applicatorful vaginally daily.   esomeprazole 20 MG capsule Commonly known as:  NEXIUM Take 20 mg by mouth daily at 12 noon.   famotidine 20 MG tablet Commonly known as:  PEPCID Take 20 mg by mouth daily as needed for heartburn or indigestion.   fexofenadine 180 MG tablet Commonly known as:  ALLEGRA Take 90 mg by mouth daily as needed for allergies or rhinitis (allergy headache).   fluticasone 50 MCG/ACT nasal spray Commonly known as:  FLONASE SHAKE LIQUID AND USE 2 SPRAYS IN EACH NOSTRIL DAILY AS NEEDED FOR ALLERGIES   HYDROcodone-acetaminophen 5-325 MG tablet Commonly known as:  NORCO/VICODIN Take 0.5 tablets by mouth every 6 (six) hours as needed for severe pain.   ICAPS AREDS 2 Caps Take 1 capsule by mouth 2 (two) times daily.   magnesium oxide 400 (241.3 Mg) MG tablet Commonly known as:  MAG-OX Take 1 tablet (400 mg total) by mouth 2 (two) times daily.   metFORMIN 500 MG tablet Commonly known as:  GLUCOPHAGE Take 2 tablets (1,000 mg total) by mouth 2 (two) times daily with a meal.   ONETOUCH DELICA LANCETS 62X Misc USE TO TEST TWICE DAILY AS DIRECTED   ONETOUCH VERIO test strip Generic drug:  glucose blood USE TO CHECK BLOOD GLUCOSE TWICE DAILY AS DIRECTED   PROBIOTIC DAILY PO Take 2 capsules by mouth daily.   sacubitril-valsartan 24-26 MG Commonly known as:  ENTRESTO Take 1 tablet by mouth 2 (two) times daily.   trimethoprim 100 MG tablet Commonly known as:  TRIMPEX TAKE 1 TABLET(100 MG) BY MOUTH DAILY What changed:  See the new instructions.   warfarin 2.5 MG tablet Commonly known as:  COUMADIN Take 1 tablet (2.5 mg total) by mouth daily. Or as directed. What changed:  how much to take  additional instructions            Durable Medical Equipment        Start     Ordered   05/22/17 0850  Heart failure home health orders  (Heart failure home health orders / Face to face)  Once    Comments:   Heart Failure Follow-up Care:  Verify follow-up appointments per Patient Discharge Instructions. Confirm transportation arranged. Reconcile home medications with discharge medication list. Remove discontinued medications from use. Assist patient/caregiver to manage medications using pill box. Reinforce low sodium food selection Assessments: Vital signs and oxygen  saturation at each visit. Assess home environment for safety concerns, caregiver support and availability of low-sodium foods. Consult Education officer, museum, PT/OT, Dietitian, and CNA based on assessments. Perform comprehensive cardiopulmonary assessment. Notify MD for any change in condition or weight gain of 3 pounds in one day or 5 pounds in one week with symptoms. Daily Weights and Symptom Monitoring: Ensure patient has access to scales. Teach patient/caregiver to weigh daily before breakfast and after voiding using same scale and record.    Teach patient/caregiver to track weight and symptoms and when to notify Provider. Activity: Develop individualized activity plan with patient/caregiver.  HHRN HHPT  Question Answer Comment  Heart Failure Follow-up Care Advanced Heart Failure (AHF) Clinic at [phone]   Obtain the following labs Other see comments   Lab frequency Other see comments   Fax lab results to Other see comments   Diet Low Sodium Heart Healthy   Fluid restrictions: 2000 mL Fluid      05/22/17 0850       Discharge Care Instructions        Start     Ordered   05/22/17 0000  amiodarone (PACERONE) 400 MG tablet  2 times daily    Question:  Supervising Provider  Answer:  Jolaine Artist   05/22/17 1143   05/22/17 0000  Diet - low sodium heart healthy     05/22/17 1143   05/22/17 0000  Increase activity slowly     05/22/17 1143   05/22/17 0000  Heart Failure patients record your daily weight using the same scale at the same time of day     05/22/17 1143      Disposition   The patient will be  discharged in stable condition to home. Discharge Instructions    Diet - low sodium heart healthy    Complete by:  As directed    Heart Failure patients record your daily weight using the same scale at the same time of day    Complete by:  As directed    Increase activity slowly    Complete by:  As directed      Follow-up Information    [facility] Follow up.   Specialty:  [place] Why:  HHRN/PT services to resume on discharge Contact information: [place] [address] [phone]        [place] [facility] Follow up on 05/28/2017.   Specialty:  Cardiology Why:  at 2:15 Contact information: [address], [address] [place]       Andalyn Heckstall, Shaune Pascal, MD Follow up on 05/31/2017.   Specialty:  Cardiology Why:  at 2:30 [place] information: [place] [address] [phone]             Duration of Discharge Encounter: Greater than 35 minutes   Signed, Darrick Grinder  np-c  05/22/2017, 12:29 PM    Patient seen and examined with Darrick Grinder, NP. We discussed all aspects of the encounter. I agree with the assessment and plan as stated above.   Feeling better. [place] that she wants to go home today. Back in NSR. Off milrinone. Can send home on amio 400 bid. Will see in clinic next week. EF improved on echo.   Glori Bickers, MD  5:42 PM

## 2017-05-22 NOTE — Progress Notes (Signed)
ANTICOAGULATION CONSULT NOTE - Follow Up Consult  Pharmacy Consult for warfarin Indication: atrial fibrillation  Allergies  Allergen Reactions  . Cortisone Other (See Comments)    Insomnia, heart palpitations (po only)  . Statins Other (See Comments)    Myalgias   . Codeine Swelling    FACIAL SWELLING SEVERITY UNKNOWN  . Sulfonamide Derivatives     UNSPECIFIED REACTION   . Lactose Intolerance (Gi) Nausea And Vomiting    Patient Measurements: Height: 5' 6.5" (168.9 cm) Weight: 121 lb 3.2 oz (55 kg) IBW/kg (Calculated) : 60.45   Vital Signs: Temp: 97.9 F (36.6 C) (09/12 0426) Temp Source: Oral (09/12 0426) BP: 95/51 (09/12 0426) Pulse Rate: 53 (09/12 0426)  Labs:  Recent Labs  05/19/17 1001  05/20/17 0332 05/20/17 0751 05/21/17 0341 05/22/17 0323  HGB  --   --   --   --   --  10.6*  HCT  --   --   --   --   --  33.1*  PLT  --   --   --   --   --  290  LABPROT  --   --  30.7*  --  28.5* 22.8*  INR  --   --  2.97  --  2.70 2.03  CREATININE  --   < > 0.91 0.83 0.81 0.90  TROPONINI 0.17*  --   --   --   --   --   < > = values in this interval not displayed.  Estimated Creatinine Clearance: 42.6 mL/min (by C-G formula based on SCr of 0.9 mg/dL).   Medications:  Scheduled:  . amiodarone  200 mg Oral BID  . aspirin EC  81 mg Oral Daily  . citalopram  20 mg Oral Daily  . insulin aspart  0-5 Units Subcutaneous QHS  . insulin aspart  0-9 Units Subcutaneous TID WC  . magnesium oxide  400 mg Oral BID  . multivitamin  1 tablet Oral BID  . ranolazine  500 mg Oral BID  . sacubitril-valsartan  1 tablet Oral BID  . trimethoprim  100 mg Oral Daily  . warfarin  2.5 mg Oral ONCE-1800  . Warfarin - Pharmacist Dosing Inpatient   Does not apply q1800    Assessment: 83 y/o female continues on warfarin for persistent Afib, now in NSR on PO amiodarone. INR continues trending down, at goal today 2.03. No bleeding noted. CBC today was low, stable. Possible discharge today or  tomorrow.  Home dose: 2.5 mg daily except 1.25 mg on Wednesdays  Goal of Therapy:  INR 2-2.5 Monitor platelets by anticoagulation protocol: Yes   Plan:  Warfarin 2.5 mg x 1, return to PTA dosing upon discharge Daily INR Monitor CBC, s/sx of bleeding   Charlene Brooke, PharmD PGY1 [facility] Resident Pager: [phone] After 4:00PM please call Main Pharmacy [phone] 05/22/2017,9:14 AM

## 2017-05-22 NOTE — Progress Notes (Signed)
Advanced Heart Failure Rounding Note   Subjective:    Admitted last night for progressive fatigue, nausea and AKI. Back in Afib on admit after amio held as outpatient with prolonged QT.   Yesterday milrinone stopped. IV amio transitioned to PO amio.  ECHO repeated and showed improving EF ~40%.  Feeling better. Wants to go home. Denies SOB.       Objective:   Weight Range:  Vital Signs:   Temp:  [97.6 F (36.4 C)-98.3 F (36.8 C)] 97.9 F (36.6 C) (09/12 0426) Pulse Rate:  [53-68] 53 (09/12 0426) Resp:  [15-22] 21 (09/12 0426) BP: (95-111)/(51-66) 95/51 (09/12 0426) SpO2:  [96 %-99 %] 96 % (09/12 0426) Weight:  [121 lb 3.2 oz (55 kg)] 121 lb 3.2 oz (55 kg) (09/12 0426) Last BM Date: 05/18/17  Weight change: Filed Weights   05/21/17 0425 05/21/17 0548 05/22/17 0426  Weight: 122 lb 6.4 oz (55.5 kg) 122 lb 8 oz (55.6 kg) 121 lb 3.2 oz (55 kg)    Intake/Output:   Intake/Output Summary (Last 24 hours) at 05/22/17 0830 Last data filed at 05/21/17 2147  Gross per 24 hour  Intake              464 ml  Output              600 ml  Net             -136 ml     Physical Exam: General:  Elderly.  No resp difficulty HEENT: normal Neck: supple. no JVD. Carotids 2+ bilat; no bruits. No lymphadenopathy or thryomegaly appreciated. Cor: PMI nondisplaced. Regular rate & rhythm. No rubs, gallops or murmurs. Lungs: clear Abdomen: soft, nontender, nondistended. No hepatosplenomegaly. No bruits or masses. Good bowel sounds. Extremities: no cyanosis, clubbing, rash, edema Neuro: alert & orientedx3, cranial nerves grossly intact. moves all 4 extremities w/o difficulty. Affect pleasant   Telemetry:  NSr 50-60s personally reviewed.   EKG - Sinus Brady 59 bpm QT 536  Labs: Basic Metabolic Panel:  Recent Labs Lab 05/19/17 1742 05/20/17 0332 05/20/17 0751 05/21/17 0341 05/22/17 0323  NA 127* 129* 130* 129* 131*  K 4.0 3.8 3.9 3.7 4.4  CL 93* 95* 96* 96* 98*  CO2 23 26 24  26 27   GLUCOSE 271* 124* 133* 135* 119*  BUN 20 21* 19 14 11   CREATININE 1.05* 0.91 0.83 0.81 0.90  CALCIUM 8.8* 8.8* 8.7* 8.6* 8.5*    Liver Function Tests:  Recent Labs Lab 05/19/17 0441  AST 19  ALT 20  ALKPHOS 97  BILITOT 0.7  PROT 6.6  ALBUMIN 3.1*   No results for input(s): LIPASE, AMYLASE in the last 168 hours. No results for input(s): AMMONIA in the last 168 hours.  CBC:  Recent Labs Lab 05/18/17 1527 05/19/17 0441 05/22/17 0323  WBC 9.3 7.1 6.6  HGB 11.6* 10.9* 10.6*  HCT 35.9* 33.2* 33.1*  MCV 92.8 92.0 93.2  PLT 408* 369 290    Cardiac Enzymes:  Recent Labs Lab 05/18/17 1527 05/18/17 2320 05/19/17 0441 05/19/17 1001  TROPONINI 0.25* 0.31* 0.25* 0.17*    BNP: BNP (last 3 results)  Recent Labs  05/18/17 1527 05/18/17 2320  BNP 1,219.6* 3,509.1*    ProBNP (last 3 results) No results for input(s): PROBNP in the last 8760 hours.    Other results:  Imaging: No results found.   Medications:     Scheduled Medications: . amiodarone  200 mg Oral BID  . aspirin  EC  81 mg Oral Daily  . citalopram  20 mg Oral Daily  . insulin aspart  0-5 Units Subcutaneous QHS  . insulin aspart  0-9 Units Subcutaneous TID WC  . magnesium oxide  400 mg Oral BID  . multivitamin  1 tablet Oral BID  . ranolazine  500 mg Oral BID  . sacubitril-valsartan  1 tablet Oral BID  . trimethoprim  100 mg Oral Daily  . Warfarin - Pharmacist Dosing Inpatient   Does not apply q1800    Infusions:   PRN Medications: bisacodyl, famotidine, HYDROcodone-acetaminophen   Assessment:   83 y.o. female with DM2,  MVP s/p bioprosthetic MV replacement and CAD s/p SVG to RCA (CABG) on 04/23/2017. Post-op course c/b by new onset severe LV dysfunction (EF 20%), PAF and shock requiring inotrope support. Converted to NSR on amio. Milrinone weaned prior to d/c. Seen in clinic last week with recurrent AF and amio increased. Admitted 9/8 with LUQ pressure, fatigue, nausea and  AKI.    Plan/Discussion:     1. Acute on chronic systolic HF  - Echo 0/09/23 EF 25-30%. Repeat Echo yesterday with improving EF ~4)%. personally reviewed by Dr Haroldine Laws.  - Off milrinone. Stable.  Maintaining NSR.  Volume status stlab.e  - Continue entresto.  -- No b-blocker with low output.   2. MVP s/p bioprosthetic MV replacement and CAD s/p SVG to RCA (CABG) on 04/23/2017.  - Troponin mildly elevated but likely due to HF.  - No s/s of ACS.   3. PAF - Maintaining NSR. Continue po amio.  EKG today QT 536. - Continue warfarin. INR 2.03.    4. DM2 - HgBa1c 5.7 - Continue SSI.   5. AKI - Resolved with hydration. Suspect primarily cardiorenal/low output exacerbated by recurrent Afib.   6. Hyponatremia - Sodium up to 131. Continue to follow. Limit free water.   7. DNR   Possible home today versus tomorrow. Followed by [facility] will order resumption today HHRN and HHT.   Length of Stay: 4  Amy Clegg, NP  05/22/2017, 8:30 AM  Advanced Heart Failure Team Pager [phone] (M-F; 7a - 4p)  Please contact [facility] for night-coverage after hours (4p -7a ) and weekends on [email or web address]   Patient seen and examined with Darrick Grinder, NP. We discussed all aspects of the encounter. I agree with the assessment and plan as stated above.   Feeling better. Adamant that she wants to go home today. Back in NSR. Off milrinone. Can send home on amio 400 bid. Will see in clinic next week. EF improved on echo.   Glori Bickers, MD  5:42 PM

## 2017-05-22 NOTE — Care Management Important Message (Signed)
Important Message  Patient Details  Name: [name] MRN: [id number] Date of Birth: 1933/10/11   Medicare Important Message Given:  Yes    Nathen May 05/22/2017, 12:05 PM

## 2017-05-22 NOTE — Care Management Note (Signed)
Case Management Note Marvetta Gibbons RN, BSN Unit 4E-Case Manager [phone]  Patient Details  Name: [name] MRN: [id number] Date of Birth: Sep 15, 1933  Subjective/Objective:  Pt admitted with acute on chronic HF                  Action/Plan: PTA pt lived at home with spouse- was active with [facility] for HHRN/PT- will need resumption orders for discharge- CM to follow for d/c needs  Expected Discharge Date:  05/22/17               Expected Discharge Plan:  [place]  In-House Referral:  NA  Discharge planning Services  CM Consult  Post Acute Care Choice:  [place], Resumption of Svcs/PTA Provider Choice offered to:  Patient  DME Arranged:    DME Agency:     HH Arranged:  RN, PT [place] Agency:  [facility] ([facility]  Status of Service:  Completed, signed off  If discussed at [employer] of Stay Meetings, dates discussed:    Discharge Disposition: home/home health   Additional Comments:  05/22/17- 1145- Orly Quimby RN, CM- pt for d/c home today- orders have been placed to resume [place] services- have notified [name] with [facility] of pt's discharge and resumption of [place] services.   Dawayne Patricia, RN 05/22/2017, 11:48 AM

## 2017-05-23 ENCOUNTER — Other Ambulatory Visit (HOSPITAL_COMMUNITY): Payer: Self-pay | Admitting: *Deleted

## 2017-05-23 ENCOUNTER — Telehealth: Payer: Self-pay | Admitting: Family Medicine

## 2017-05-23 MED ORDER — AMIODARONE HCL 400 MG PO TABS: 400.0000 mg | ORAL_TABLET | Freq: Two times a day (BID) | ORAL | 3 refills | Status: DC

## 2017-05-23 MED ORDER — AMIODARONE HCL 200 MG PO TABS: 400.0000 mg | ORAL_TABLET | Freq: Two times a day (BID) | ORAL | 3 refills | Status: DC

## 2017-05-23 NOTE — Telephone Encounter (Signed)
[name] from [facility] [phone] called to let the MD know that the Physical Therapist  will be going to the patients home on Saturday 05/25/17 to start home health physical therapy.

## 2017-05-24 ENCOUNTER — Telehealth (HOSPITAL_COMMUNITY): Payer: Self-pay | Admitting: *Deleted

## 2017-05-24 ENCOUNTER — Telehealth: Payer: Self-pay | Admitting: Family Medicine

## 2017-05-24 NOTE — Telephone Encounter (Signed)
° ° ° °  [name] with [facility] call to make the doctor aware that pt is taking the below meds together and is asking if this is ok    clonazePAM (KLONOPIN) 0.5 MG tablet  HYDROcodone-acetaminophen (NORCO/VICODIN) 5-325 MG tablet     PULG  [phone]

## 2017-05-24 NOTE — Telephone Encounter (Signed)
I'm not sure who prescribed the hydrocodone. I definitely agree that (at her age especially) this is not ideal. Would try to have her hold hydrocodone unless she's having severe pain. If she feels she cannot do without hydrocodone other option be to hold her Klonopin for now

## 2017-05-24 NOTE — Telephone Encounter (Signed)
Spoke with pharmacy and Klonopin will be held until further notice.  Spoke with husband and he states that she is not Hydrocodone but does not a refill Klonopin.

## 2017-05-24 NOTE — Telephone Encounter (Signed)
Entered in error

## 2017-05-24 NOTE — Telephone Encounter (Signed)
noted 

## 2017-05-25 DIAGNOSIS — M1991 Primary osteoarthritis, unspecified site: Secondary | ICD-10-CM | POA: Diagnosis not present

## 2017-05-25 DIAGNOSIS — I5023 Acute on chronic systolic (congestive) heart failure: Secondary | ICD-10-CM | POA: Diagnosis not present

## 2017-05-25 DIAGNOSIS — Z48812 Encounter for surgical aftercare following surgery on the circulatory system: Secondary | ICD-10-CM | POA: Diagnosis not present

## 2017-05-25 DIAGNOSIS — M47817 Spondylosis without myelopathy or radiculopathy, lumbosacral region: Secondary | ICD-10-CM | POA: Diagnosis not present

## 2017-05-25 DIAGNOSIS — I481 Persistent atrial fibrillation: Secondary | ICD-10-CM | POA: Diagnosis not present

## 2017-05-25 DIAGNOSIS — I5082 Biventricular heart failure: Secondary | ICD-10-CM | POA: Diagnosis not present

## 2017-05-25 DIAGNOSIS — M419 Scoliosis, unspecified: Secondary | ICD-10-CM | POA: Diagnosis not present

## 2017-05-25 DIAGNOSIS — I2510 Atherosclerotic heart disease of native coronary artery without angina pectoris: Secondary | ICD-10-CM | POA: Diagnosis not present

## 2017-05-25 DIAGNOSIS — H3530 Unspecified macular degeneration: Secondary | ICD-10-CM | POA: Diagnosis not present

## 2017-05-25 DIAGNOSIS — E119 Type 2 diabetes mellitus without complications: Secondary | ICD-10-CM | POA: Diagnosis not present

## 2017-05-25 DIAGNOSIS — I110 Hypertensive heart disease with heart failure: Secondary | ICD-10-CM | POA: Diagnosis not present

## 2017-05-27 DIAGNOSIS — M419 Scoliosis, unspecified: Secondary | ICD-10-CM | POA: Diagnosis not present

## 2017-05-27 DIAGNOSIS — E119 Type 2 diabetes mellitus without complications: Secondary | ICD-10-CM | POA: Diagnosis not present

## 2017-05-27 DIAGNOSIS — H3530 Unspecified macular degeneration: Secondary | ICD-10-CM | POA: Diagnosis not present

## 2017-05-27 DIAGNOSIS — M47817 Spondylosis without myelopathy or radiculopathy, lumbosacral region: Secondary | ICD-10-CM | POA: Diagnosis not present

## 2017-05-27 DIAGNOSIS — Z48812 Encounter for surgical aftercare following surgery on the circulatory system: Secondary | ICD-10-CM | POA: Diagnosis not present

## 2017-05-27 DIAGNOSIS — I110 Hypertensive heart disease with heart failure: Secondary | ICD-10-CM | POA: Diagnosis not present

## 2017-05-27 DIAGNOSIS — I5082 Biventricular heart failure: Secondary | ICD-10-CM | POA: Diagnosis not present

## 2017-05-27 DIAGNOSIS — I5023 Acute on chronic systolic (congestive) heart failure: Secondary | ICD-10-CM | POA: Diagnosis not present

## 2017-05-27 DIAGNOSIS — M1991 Primary osteoarthritis, unspecified site: Secondary | ICD-10-CM | POA: Diagnosis not present

## 2017-05-27 DIAGNOSIS — I2510 Atherosclerotic heart disease of native coronary artery without angina pectoris: Secondary | ICD-10-CM | POA: Diagnosis not present

## 2017-05-27 DIAGNOSIS — I481 Persistent atrial fibrillation: Secondary | ICD-10-CM | POA: Diagnosis not present

## 2017-05-28 ENCOUNTER — Encounter: Payer: Self-pay | Admitting: Physician Assistant

## 2017-05-28 ENCOUNTER — Ambulatory Visit (INDEPENDENT_AMBULATORY_CARE_PROVIDER_SITE_OTHER): Payer: Medicare Other | Admitting: *Deleted

## 2017-05-28 ENCOUNTER — Ambulatory Visit (INDEPENDENT_AMBULATORY_CARE_PROVIDER_SITE_OTHER): Payer: Medicare Other | Admitting: Physician Assistant

## 2017-05-28 VITALS — BP 122/60 | HR 62 | Ht 66.5 in | Wt 121.0 lb

## 2017-05-28 DIAGNOSIS — I5022 Chronic systolic (congestive) heart failure: Secondary | ICD-10-CM | POA: Diagnosis not present

## 2017-05-28 DIAGNOSIS — Z953 Presence of xenogenic heart valve: Secondary | ICD-10-CM

## 2017-05-28 DIAGNOSIS — R9431 Abnormal electrocardiogram [ECG] [EKG]: Secondary | ICD-10-CM

## 2017-05-28 DIAGNOSIS — I481 Persistent atrial fibrillation: Secondary | ICD-10-CM

## 2017-05-28 DIAGNOSIS — I4891 Unspecified atrial fibrillation: Secondary | ICD-10-CM | POA: Diagnosis not present

## 2017-05-28 DIAGNOSIS — I2510 Atherosclerotic heart disease of native coronary artery without angina pectoris: Secondary | ICD-10-CM | POA: Diagnosis not present

## 2017-05-28 DIAGNOSIS — Z7901 Long term (current) use of anticoagulants: Secondary | ICD-10-CM | POA: Diagnosis not present

## 2017-05-28 DIAGNOSIS — I059 Rheumatic mitral valve disease, unspecified: Secondary | ICD-10-CM | POA: Diagnosis not present

## 2017-05-28 DIAGNOSIS — I4819 Other persistent atrial fibrillation: Secondary | ICD-10-CM

## 2017-05-28 DIAGNOSIS — I498 Other specified cardiac arrhythmias: Secondary | ICD-10-CM | POA: Diagnosis not present

## 2017-05-28 LAB — PROTIME-INR
INR: 8.7 — AB (ref 0.8–1.2)
PROTHROMBIN TIME: 92.3 s — AB (ref 9.1–12.0)

## 2017-05-28 LAB — CBC
Hematocrit: 34.8 % (ref 34.0–46.6)
Hemoglobin: 11.6 g/dL (ref 11.1–15.9)
MCH: 30.6 pg (ref 26.6–33.0)
MCHC: 33.3 g/dL (ref 31.5–35.7)
MCV: 92 fL (ref 79–97)
PLATELETS: 375 10*3/uL (ref 150–379)
RBC: 3.79 x10E6/uL (ref 3.77–5.28)
RDW: 15.7 % — AB (ref 12.3–15.4)
WBC: 11.3 10*3/uL — ABNORMAL HIGH (ref 3.4–10.8)

## 2017-05-28 LAB — BASIC METABOLIC PANEL
BUN/Creatinine Ratio: 18 (ref 12–28)
BUN: 15 mg/dL (ref 8–27)
CHLORIDE: 95 mmol/L — AB (ref 96–106)
CO2: 18 mmol/L — AB (ref 20–29)
Calcium: 9.2 mg/dL (ref 8.7–10.3)
Creatinine, Ser: 0.84 mg/dL (ref 0.57–1.00)
GFR calc Af Amer: 75 mL/min/{1.73_m2} (ref >59)
GFR, EST NON AFRICAN AMERICAN: 65 mL/min/{1.73_m2} (ref >59)
Glucose: 109 mg/dL — ABNORMAL HIGH (ref 65–99)
POTASSIUM: 4.7 mmol/L (ref 3.5–5.2)
SODIUM: 128 mmol/L — AB (ref 134–144)

## 2017-05-28 LAB — MAGNESIUM: Magnesium: 1.5 mg/dL — ABNORMAL LOW (ref 1.6–2.3)

## 2017-05-28 LAB — POCT INR: INR: >8

## 2017-05-28 MED ORDER — AMIODARONE HCL 200 MG PO TABS: 200.0000 mg | ORAL_TABLET | Freq: Two times a day (BID) | ORAL | 3 refills | Status: DC

## 2017-05-28 NOTE — Patient Instructions (Addendum)
Medication Instructions:  1. DECREASE AMIODARONE TO 200 MG 1 TABLET TWICE DAILY  Labwork: 1. TODAY BMET, MAG LEVEL, CBC  Testing/Procedures: NONE ORDERED TODAY  Follow-Up: 06/19/17 @ 10:15 WITH SCOTT WEAVER, PAC   Any Other Special Instructions Will Be Listed Below (If Applicable). YOU WILL NEED TO MAKE SURE TO HAVE AN EKG DONE Friday AT YOUR APPT AT THE HEART FAILURE CLINIC DUE TO AMIODARONE DOSE WAS CHANGED TODAY    If you need a refill on your cardiac medications before your next appointment, please call your pharmacy.

## 2017-05-28 NOTE — Progress Notes (Signed)
Cardiology Office Note:    Date:  05/28/2017   ID:  [name], DOB 02/10/1934, MRN [id number]  PCP:  Eulas Post, MD  Cardiologist:  Dr. Dorris Carnes    Referring MD: Eulas Post, MD   Chief Complaint  Patient presents with  . Hospitalization Follow-up    recent CABG/MVR; readmx with AF with RVR and CHF    History of Present Illness:    ANYSIA Mccarthy is Lori 83 y.o. female with Lori hx of mitral valve prolapse with mitral regurgitation, CAD, HTN, diabetes, HL. In June 2018, the patient noted worsening shortness of breath felt to be related to mitral regurgitation.  Cardiac catheterization demonstrated severe single vessel CAD with 60% proximal, 99% distal RCA stenosis and chronic total occlusion of the ostial RPDA. The RPDA and PL branches are supplied by left to right collaterals. In addition, there is 70% proximal D2 stenosis and otherwise mild nonobstructive disease in the LAD and LCx. [name] had atrial fibrillation documented and was placed on Xarelto for anticoagulation. [name] was evaluated by Dr. Roxy Manns and underwent CABG times one (SVG-distal RCA), bioprosthetic mitral valve replacement and left atrial appendage clipping 04/23/17. Postoperative course was notable for systolic heart failure secondary to worsening LV function with an EF 25-30%. [name] was followed by the heart failure team and required inotropic support with milrinone. [name] was placed on Coumadin. [name] developed atrial fibrillation and was placed on amiodarone. After discharge, [name] was seen back in the congestive heart failure clinic 05/15/17. [name] was back in atrial fibrillation at that time. Amiodarone dose was increased. [name] was then admitted 9/8-9/12 with left upper quadrant pressure, fatigue, nausea and AKI and AF with RVR. [name] was thought to have low output symptoms and was placed back on milrinone. [name] converted back to NSR on IV amiodarone. [name] was transitioned back to oral amiodarone. Follow-up echo 05/21/17 demonstrated  improved LV function with EF 35-40%. MVR was functioning appropriately.  [name] returns for follow up.  [name] is here with her husband.  INR today was > 8 in the coumadin clinic.  [name] needs labs drawn.  [name] is working with [place] and does feel [name] is getting stronger.  [name] feels tired and notes resting tremors and nausea when [name] takes her medications.  [name] denies paroxysmal nocturnal dyspnea or edema.  [name] does note dyspnea on exertion but this is improved.  Her chest is still sore but improved. [name] denies syncope or near syncope.  [name] denies bleeding issues.  [name] has follow up with the HF Clinic again this Friday 05/31/17.   Prior CV studies:   The following studies were reviewed today:  Echocardiogram 05/21/17 Mild LVH, EF 35-40, apical akinesis, trivial AI, bioprosthetic MVR with trace MR, severe LAE, PASP 34  Limited echocardiogram 04/26/17 Mild concentric LVH, EF 25-30, diffuse HK, bioprosthetic MVR functioning normally, moderately reduced RVSF, moderate TR, PASP 38  TEE 04/23/17 EF 55-60  Carotid US 04/19/17 Bilateral ICA 1-39  Cardiac catheterization 03/28/17 LAD mid 20, D1 ostial 30, D2 70 LCx mild disease RCA proximal 60, distal 99, RPDA 100, RPAVB 70 Mean RA 5, mean PA 17, mean PCWP 12 LVEDP 16  Echocardiogram 02/12/17 EF 55-60, normal wall motion, moderate late systolic prolapse involving the anterior leaflet and posterior leaflet with at least moderate MR  Past Medical History:  Diagnosis Date  . Allergy   . Anxiety   . Arthritis   . Atrial enlargement, left   . CHF (congestive heart  failure) ([place])   . Coronary artery disease   . Depression   . Diabetes mellitus without complication (HCC)    fasting cbg 110s  . Dyspnea   . GERD (gastroesophageal reflux disease)   . H/O hiatal hernia   . Headache(784.0)   . Heart murmur    06/10/2014 seeing new cardiologist  . Heart murmur   . HOH (hard of hearing)    HOH in left ear; needs to speak to patient in right ear  .  Hyperlipidemia   . Hypertension   . Mitral regurgitation   . Mitral valve prolapse   . Palpitations    afib on 03/04/17 EKG  . PONV (postoperative nausea and vomiting)    Patient stated "I like the patch behind my ear"  . 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 S/P mitral valve replacement with bioprosthetic valve 04/23/2017   [facility] Mitral bovine pericardial tissue valve: Model 7300TFX, Serial [id number], size 31  . UTI (lower urinary tract infection)     Past Surgical History:  Procedure Laterality Date  . ABDOMINAL HYSTERECTOMY    . Anterior posterior and enterocele repairs  09/21/2004   With uterosacral cardinal colposuspension, partial colpocleisis; Selinda Orion, MD  . BACK SURGERY    . BREAST ENHANCEMENT SURGERY  02/25/2002   Bilateral reduction and excision of accessory breast tissue underneath the left breast; Aretha Parrot., MD  . BREAST SURGERY  11   reduction  . CARDIAC CATHETERIZATION     no PCI  . CARDIAC CATHETERIZATION  05/02/2017  . CATARACT EXTRACTION Bilateral   . CLIPPING OF ATRIAL APPENDAGE  04/23/2017   Procedure: CLIPPING OF LEFT ATRIAL APPENDAGE;  Surgeon: [name], MD;  Location: [place];  Service: Open Heart Surgery;;  . COLONOSCOPY    . CORONARY ARTERY BYPASS GRAFT N/Lori 04/23/2017   Procedure: CORONARY ARTERY BYPASS GRAFTING (CABG), ON PUMP, TIMES ONE, USING ENDOSCOPICALLY HARVESTED RIGHT GREATER SAPHENOUS VEIN;  Surgeon: [name], MD;  Location: [place];  Service: Open Heart Surgery;  Laterality: N/Lori;  . EYE SURGERY Bilateral    cataract removal  . LUMBAR LAMINECTOMY/ DECOMPRESSION WITH MET-RX N/Lori 03/16/2014   Procedure: LUMBAR FIVE-SACRAL ONE EXTRAFORAMINAL DISKECTOMY WITH METREX;  Surgeon: Kristeen Miss, MD;  Location: [place] [facility];  Service: Neurosurgery;  Laterality: N/Lori;  . LUMBAR LAMINECTOMY/DECOMPRESSION MICRODISCECTOMY Left 06/08/2014   Procedure: Left Lumbar Five-Sacral One Microdiskectomy;   Surgeon: Kristeen Miss, MD;  Location: [place] [facility];  Service: Neurosurgery;  Laterality: Left;  Left L5-S1 Microdiskectomy  . MITRAL VALVE REPLACEMENT N/Lori 04/23/2017   Procedure: MITRAL VALVE (MV) REPLACEMENT;  Surgeon: [name], MD;  Location: [place];  Service: Open Heart Surgery;  Laterality: N/Lori;  . PELVIC FLOOR REPAIR    . POSTERIOR LUMBAR FUSION 4 LEVEL N/Lori 08/08/2015   Procedure: T8-L1 posterior lateral fusion with decompression T12-L1;  Surgeon: Kristeen Miss, MD;  Location: [place] [facility];  Service: Neurosurgery;  Laterality: N/Lori;  T8-L1 posterior lateral fusion with decompression T12-L1  . RIGHT/LEFT HEART CATH AND CORONARY ANGIOGRAPHY N/Lori 03/28/2017   Procedure: Right/Left Heart Cath and Coronary Angiography;  Surgeon: Nelva Bush, MD;  Location: [place] CV LAB;  Service: Cardiovascular;  Laterality: N/Lori;  . ROTATOR CUFF REPAIR Bilateral    11.12  . TEE WITHOUT CARDIOVERSION N/Lori 03/04/2017   Procedure: TRANSESOPHAGEAL ECHOCARDIOGRAM (TEE);  Surgeon: Fay Records, MD;  Location: [place];  Service: Cardiovascular;  Laterality: N/Lori;  . TEE WITHOUT CARDIOVERSION N/Lori 04/23/2017   Procedure: TRANSESOPHAGEAL ECHOCARDIOGRAM (TEE);  Surgeon: [name], MD;  Location: [place];  Service: Open Heart Surgery;  Laterality: N/Lori;  . TONSILLECTOMY AND ADENOIDECTOMY      Current Medications: Current Meds  Medication Sig  . aspirin EC 81 MG EC tablet Take 1 tablet (81 mg total) by mouth daily.  . bisacodyl (DULCOLAX) 5 MG EC tablet Take 10 mg by mouth daily as needed (for constipation.).  [name] citalopram (CELEXA) 20 MG tablet Take 1 tablet (20 mg total) by mouth daily.  . clonazePAM (KLONOPIN) 0.5 MG tablet Take one tablet by mouth PRN insomnia or anxiety.  . conjugated estrogens (PREMARIN) vaginal cream Place 1 Applicatorful vaginally daily.  [name] esomeprazole (NEXIUM) 20 MG capsule Take 20 mg by mouth daily at 12 noon.  . famotidine (PEPCID) 20 MG tablet Take 20 mg by mouth daily as  needed for heartburn or indigestion.  . fexofenadine (ALLEGRA) 180 MG tablet Take 90 mg by mouth daily as needed for allergies or rhinitis (allergy headache).  . fluticasone (FLONASE) 50 MCG/ACT nasal spray SHAKE LIQUID AND USE 2 SPRAYS IN EACH NOSTRIL DAILY AS NEEDED FOR ALLERGIES  . HYDROcodone-acetaminophen (NORCO/VICODIN) 5-325 MG tablet Take 0.5 tablets by mouth every 6 (six) hours as needed for severe pain.  . magnesium oxide (MAG-OX) 400 (241.3 Mg) MG tablet Take 1 tablet (400 mg total) by mouth 2 (two) times daily.  . metFORMIN (GLUCOPHAGE) 500 MG tablet Take 2 tablets (1,000 mg total) by mouth 2 (two) times daily with Lori meal.  . Multiple Vitamins-Minerals (ICAPS AREDS 2) CAPS Take 1 capsule by mouth 2 (two) times daily.  Glory Rosebush DELICA LANCETS 34L MISC USE TO TEST TWICE DAILY AS DIRECTED  . ONETOUCH VERIO test strip USE TO CHECK BLOOD GLUCOSE TWICE DAILY AS DIRECTED  . Probiotic Product (PROBIOTIC DAILY PO) Take 2 capsules by mouth daily.   . sacubitril-valsartan (ENTRESTO) 24-26 MG Take 1 tablet by mouth 2 (two) times daily.  [name] trimethoprim (TRIMPEX) 100 MG tablet TAKE 1 TABLET(100 MG) BY MOUTH DAILY  . warfarin (COUMADIN) 2.5 MG tablet Take 1 tablet (2.5 mg total) by mouth daily. Or as directed.  . [DISCONTINUED] amiodarone (PACERONE) 400 MG tablet Take 1 tablet (400 mg total) by mouth 2 (two) times daily.     Allergies:   Cortisone; Statins; Codeine; Sulfonamide derivatives; and Lactose intolerance (gi)   Social History   Social History  . Marital status: Married    Spouse name: N/Lori  . Number of children: N/Lori  . Years of education: N/Lori   Occupational History  . Retired Retired   Social History Main Topics  . Smoking status: Never Smoker  . Smokeless tobacco: Never Used  . Alcohol use No  . Drug use: No  . Sexual activity: Not Asked   Other Topics Concern  . None   Social History Narrative   Married   Regular exercise     Family Hx: The patient's family  history includes Arthritis in her father and mother; Heart disease in her father; Heart failure in her father; Hyperlipidemia in her father and mother; Hypertension in her mother.  ROS:   Please see the history of present illness.    Review of Systems  Gastrointestinal: Positive for constipation.  Neurological: Positive for loss of balance.   All other systems reviewed and are negative.   EKGs/Labs/Other Test Reviewed:    EKG:  EKG is  ordered  today.  The ekg ordered today demonstrates Junctional rhythm, HR 64, QTc 563 ms  Recent Labs: 05/18/2017: B Natriuretic Peptide 3,509.1; TSH 1.716 05/19/2017: ALT 20 05/28/2017: BUN WILL FOLLOW; Creatinine, Ser WILL FOLLOW; Hemoglobin 11.6; Magnesium WILL FOLLOW; Platelets 375; Potassium WILL FOLLOW; Sodium WILL FOLLOW   Recent Lipid Panel Lab Results  Component Value Date/Time   CHOL 163 08/22/2016 07:46 AM   TRIG 101 08/22/2016 07:46 AM   HDL 65 08/22/2016 07:46 AM   CHOLHDL 2.5 08/22/2016 07:46 AM   LDLCALC 78 08/22/2016 07:46 AM   LDLDIRECT 143.6 06/04/2012 09:31 AM    Physical Exam:    VS:  BP 122/60   Pulse 62   Ht 5' 6.5" (1.689 m)   Wt 121 lb (54.9 kg)   SpO2 94%   BMI 19.24 kg/m     Wt Readings from Last 3 Encounters:  05/28/17 121 lb (54.9 kg)  05/22/17 121 lb 3.2 oz (55 kg)  05/15/17 121 lb (54.9 kg)     Physical Exam  Constitutional: [name] is oriented to person, place, and time. [name] appears well-developed and well-nourished. No distress.  HENT:  Head: Normocephalic and atraumatic.  Eyes: No scleral icterus.  Neck: No JVD (at 90 degrees) present.  Cardiovascular: Normal rate and regular rhythm.   No murmur heard. Pulmonary/Chest: Effort normal. [name] has no rales.  Abdominal: Soft. [name] exhibits no distension.  Musculoskeletal: [name] exhibits no edema.  Neurological: [name] is alert and oriented to person, place, and time.  Skin: Skin is warm and dry.  Psychiatric: [name] has Lori normal mood and affect.    ASSESSMENT:     1. Chronic systolic heart failure (HCC)   2. Persistent atrial fibrillation ([place])   3. Coronary artery disease involving native coronary artery of native heart without angina pectoris   4. S/P mitral valve replacement with bioprosthetic valve + CABG x1   5. Junctional rhythm   6. Prolonged QT interval    PLAN:    In order of problems listed above:  1. Chronic systolic heart failure ([place])  [name] has struggled with systolic heart failure since her mitral valve surgery. EF was previously normal and reduced to 25-30% postoperatively. [name]'s had difficulty with low output heart failure and required milrinone postoperatively as well as during her most recent hospitalization. [name] is also followed closely by the heart failure clinic. [name] has Lori follow-up visit again later this week. Currently, her volume status appears to be stable. [name] is not currently on Lori beta blocker. Given her junctional rhythm, this cannot be initiated at this point in time. [name] remains on Entresto. Most recent echocardiogram 05/21/17 demonstrates improved LV function with EF of 35-40%.  -  Continue current Rx  -  Follow-up later this week with the heart failure clinic  -  Follow-up with Dr. Harrington Challenger or me in 3-4 weeks  2. Persistent atrial fibrillation ([place]) Now, [name] is in junctional rhythm. [name] has been on high-dose amiodarone for several weeks. Continue Coumadin. As noted, INR by venipuncture is pending today after INR was noted to be greater than 8. Obtain CBC today.  3. Coronary artery disease involving native coronary artery of native heart without angina pectoris Status post single-vessel CABG with SVG-distal RCA. Continue aspirin. [name] is intolerant of statins.   4. S/P mitral valve replacement with bioprosthetic valve + CABG x1 Recent echo with well-functioning bioprosthetic MVR. Continue SBE prophylaxis.   5. Junctional rhythm [name] is now in junctional rhythm. [name] also notes nausea as  well as tremors. I suspect that this  is all related to her amiodarone dose. I reviewed her case Lori with Dr. Irish Lack (DOD). We will decrease amiodarone to 200 mg twice Lori day. [name] will need an ECG when [name] returns for follow-up to the heart failure clinic later this week. If [name] remains in junctional rhythm, we may need to consider decreasing her amiodarone dose further.  -  Decrease Amio to 200 bid  -  Get ECG at FU 9/21 with Dr. Glori Bickers   6. Prolonged QT interval I suspect that this is related amiodarone. Dose of amiodarone will be decreased as noted above. Obtain BMET, magnesium today. Repeat ECG later this week.   Dispo:  Return in about 4 weeks (around 06/25/2017) for Close Follow Up, w/ Dr. Harrington Challenger, or Richardson Dopp, PA-C.   Medication Adjustments/Labs and Tests Ordered: Current medicines are reviewed at length with the patient today.  Concerns regarding medicines are outlined above.  Tests Ordered: Orders Placed This Encounter  Procedures  . Basic Metabolic Panel (BMET)  . Magnesium  . CBC  . EKG 12-Lead   Medication Changes: Meds ordered this encounter  Medications  . amiodarone (PACERONE) 200 MG tablet    Sig: Take 1 tablet (200 mg total) by mouth 2 (two) times daily.    Dispense:  180 tablet    Refill:  3    Signed, Richardson Dopp, PA-C  05/28/2017 [facility] [address], [place], [place]  [address] Phone: [phone]; Fax: [phone]

## 2017-05-29 ENCOUNTER — Telehealth: Payer: Self-pay

## 2017-05-29 DIAGNOSIS — I5022 Chronic systolic (congestive) heart failure: Secondary | ICD-10-CM

## 2017-05-29 NOTE — Telephone Encounter (Signed)
-----   Message from Liliane Shi, [place] sent at 05/29/2017  9:16 AM EDT ----- Please call patient. The hemoglobin is stable/improved. Magnesium remains low.  Continue magnesium supplement and try to increase dietary magnesium (fruits, vegetables - bananas, dried apricots, and avocados - nuts, beans, milk). Kidney function is normal. The sodium is low.  Restrict fluids to 1500 cc per day. Repeat BMET 1 week. Richardson Dopp, PA-C    05/29/2017 9:16 AM

## 2017-05-29 NOTE — Telephone Encounter (Signed)
Pt aware of lab results and Richardson Dopp, PA recommendations. Pt verbalized understanding to the instructions given.  Pt will have her repeat bmet on 9/21.

## 2017-05-31 ENCOUNTER — Ambulatory Visit (INDEPENDENT_AMBULATORY_CARE_PROVIDER_SITE_OTHER): Payer: Medicare Other | Admitting: *Deleted

## 2017-05-31 ENCOUNTER — Ambulatory Visit (HOSPITAL_COMMUNITY)
Admit: 2017-05-31 | Discharge: 2017-05-31 | Disposition: A | Discharge status: Home / Self Care | Payer: Medicare Other | Source: Ambulatory Visit | Attending: Cardiology | Admitting: Cardiology

## 2017-05-31 ENCOUNTER — Other Ambulatory Visit: Payer: Medicare Other | Admitting: *Deleted

## 2017-05-31 VITALS — BP 124/58 | HR 68 | Wt 121.6 lb

## 2017-05-31 DIAGNOSIS — I5022 Chronic systolic (congestive) heart failure: Secondary | ICD-10-CM | POA: Diagnosis not present

## 2017-05-31 DIAGNOSIS — F329 Major depressive disorder, single episode, unspecified: Secondary | ICD-10-CM | POA: Diagnosis not present

## 2017-05-31 DIAGNOSIS — I481 Persistent atrial fibrillation: Secondary | ICD-10-CM | POA: Diagnosis not present

## 2017-05-31 DIAGNOSIS — Z8249 Family history of ischemic heart disease and other diseases of the circulatory system: Secondary | ICD-10-CM | POA: Diagnosis not present

## 2017-05-31 DIAGNOSIS — I341 Nonrheumatic mitral (valve) prolapse: Secondary | ICD-10-CM | POA: Diagnosis not present

## 2017-05-31 DIAGNOSIS — E119 Type 2 diabetes mellitus without complications: Secondary | ICD-10-CM | POA: Diagnosis not present

## 2017-05-31 DIAGNOSIS — I480 Paroxysmal atrial fibrillation: Secondary | ICD-10-CM | POA: Diagnosis not present

## 2017-05-31 DIAGNOSIS — Z951 Presence of aortocoronary bypass graft: Secondary | ICD-10-CM | POA: Diagnosis not present

## 2017-05-31 DIAGNOSIS — Z8261 Family history of arthritis: Secondary | ICD-10-CM | POA: Diagnosis not present

## 2017-05-31 DIAGNOSIS — R9431 Abnormal electrocardiogram [ECG] [EKG]: Secondary | ICD-10-CM

## 2017-05-31 DIAGNOSIS — Z5181 Encounter for therapeutic drug level monitoring: Secondary | ICD-10-CM | POA: Diagnosis not present

## 2017-05-31 DIAGNOSIS — Z888 Allergy status to other drugs, medicaments and biological substances status: Secondary | ICD-10-CM | POA: Insufficient documentation

## 2017-05-31 DIAGNOSIS — I4891 Unspecified atrial fibrillation: Secondary | ICD-10-CM

## 2017-05-31 DIAGNOSIS — Z7901 Long term (current) use of anticoagulants: Secondary | ICD-10-CM

## 2017-05-31 DIAGNOSIS — Z953 Presence of xenogenic heart valve: Secondary | ICD-10-CM

## 2017-05-31 DIAGNOSIS — I2510 Atherosclerotic heart disease of native coronary artery without angina pectoris: Secondary | ICD-10-CM | POA: Diagnosis not present

## 2017-05-31 DIAGNOSIS — H9192 Unspecified hearing loss, left ear: Secondary | ICD-10-CM | POA: Diagnosis not present

## 2017-05-31 DIAGNOSIS — F419 Anxiety disorder, unspecified: Secondary | ICD-10-CM | POA: Diagnosis not present

## 2017-05-31 DIAGNOSIS — Z7984 Long term (current) use of oral hypoglycemic drugs: Secondary | ICD-10-CM | POA: Diagnosis not present

## 2017-05-31 DIAGNOSIS — I4819 Other persistent atrial fibrillation: Secondary | ICD-10-CM

## 2017-05-31 DIAGNOSIS — Z7982 Long term (current) use of aspirin: Secondary | ICD-10-CM | POA: Diagnosis not present

## 2017-05-31 DIAGNOSIS — Z885 Allergy status to narcotic agent status: Secondary | ICD-10-CM | POA: Diagnosis not present

## 2017-05-31 DIAGNOSIS — M1990 Unspecified osteoarthritis, unspecified site: Secondary | ICD-10-CM | POA: Insufficient documentation

## 2017-05-31 DIAGNOSIS — I059 Rheumatic mitral valve disease, unspecified: Secondary | ICD-10-CM

## 2017-05-31 DIAGNOSIS — Z882 Allergy status to sulfonamides status: Secondary | ICD-10-CM | POA: Insufficient documentation

## 2017-05-31 DIAGNOSIS — I110 Hypertensive heart disease with heart failure: Secondary | ICD-10-CM | POA: Diagnosis not present

## 2017-05-31 DIAGNOSIS — M419 Scoliosis, unspecified: Secondary | ICD-10-CM | POA: Insufficient documentation

## 2017-05-31 DIAGNOSIS — Z79899 Other long term (current) drug therapy: Secondary | ICD-10-CM | POA: Insufficient documentation

## 2017-05-31 DIAGNOSIS — I080 Rheumatic disorders of both mitral and aortic valves: Secondary | ICD-10-CM | POA: Diagnosis not present

## 2017-05-31 DIAGNOSIS — K219 Gastro-esophageal reflux disease without esophagitis: Secondary | ICD-10-CM | POA: Insufficient documentation

## 2017-05-31 DIAGNOSIS — I5082 Biventricular heart failure: Secondary | ICD-10-CM | POA: Diagnosis not present

## 2017-05-31 DIAGNOSIS — E785 Hyperlipidemia, unspecified: Secondary | ICD-10-CM | POA: Diagnosis not present

## 2017-05-31 DIAGNOSIS — I5042 Chronic combined systolic (congestive) and diastolic (congestive) heart failure: Secondary | ICD-10-CM | POA: Insufficient documentation

## 2017-05-31 LAB — POCT INR: INR: 3.3

## 2017-05-31 MED ORDER — SACUBITRIL-VALSARTAN 24-26 MG PO TABS: 1.0000 | ORAL_TABLET | Freq: Two times a day (BID) | ORAL | 11 refills | Status: DC

## 2017-05-31 MED ORDER — AMIODARONE HCL 200 MG PO TABS: 200.0000 mg | ORAL_TABLET | Freq: Every day | ORAL | 3 refills | Status: DC

## 2017-05-31 NOTE — Patient Instructions (Addendum)
DECREASE Amiodarone to 200 mg daily  Your physician recommends that you schedule a follow-up appointment in:  One month with Dr Haroldine Laws   Do the following things EVERYDAY: 1) Weigh yourself in the morning before breakfast. Write it down and keep it in a log. 2) Take your medicines as prescribed 3) Eat low salt foods-Limit salt (sodium) to 2000 mg per day.  4) Stay as active as you can everyday 5) Limit all fluids for the day to less than 2 liters

## 2017-05-31 NOTE — Progress Notes (Signed)
Advanced Heart Failure Medication Review by a Pharmacist  Does the patient  feel that his/her medications are working for him/her?  yes  Has the patient been experiencing any side effects to the medications prescribed?  no  Does the patient measure his/her own blood pressure or blood glucose at home?  yes   Does the patient have any problems obtaining medications due to transportation or finances?   no  Understanding of regimen: good Understanding of indications: good Potential of compliance: good Patient understands to avoid NSAIDs. Patient understands to avoid decongestants.  Issues to address at subsequent visits: None   Pharmacist comments: [name] is a pleasant 83 yo F presenting with her husband and without a medication list. She reports good compliance with her regimen and did not have any specific medication-related questions or concerns for me at this time.   [name]. Velva Harman, PharmD, BCPS, CPP Clinical Pharmacist Pager: [phone] Phone: [phone] 05/31/2017 2:51 PM      Time with patient: 10 minutes Preparation and documentation time: 2 minutes Total time: 12 minutes

## 2017-05-31 NOTE — Progress Notes (Signed)
-  Advanced Heart Failure Clinic Note   Primary Cardiologist: Dr. Harrington Challenger Primary HF: Dr. Haroldine Laws   HPI:  [name] is a 83 y.o. female with h/o MVP, HTN, DM2, scoliosis, and CAD. She had developed DOE over the past 2-3 months along with occasional peripheral edema. TEE 03/04/17 showed severe MR and severe LAE. L/RHC 03/28/17 with severe multivessel disease. Pt was referred for surgical consultation by Dr. Roxy Manns.   Pt underwent CABG x 1 and MVR replacement 04/23/17. Milrinone added 04/27/17 with Mixed venous saturation of 33%. HF team consulted for assistance. Pressures support adjusted and weaned as tolerated. Course complicated by Afib. Started on IV amiodarone and pt chemically converted to NSR overnight into 05/05/17.  Hospital course complicated by depressed coox in lower 50% range off milrinone. Pt continued to improve symptomatically and was thought to be a poor candidate for home milrinone with recent valve surgery and increase risk of infection. Coox 53.5% on day of discharge. Sent home without PICC with close follow up. Discharge weight 120 lbs  Admitted 05/18/17-05/22/17 with AKI. Lasix and Arlyce Harman were stopped. Required milrinone, and also went back into atrial fibrillation with RVR, but converted to NSR on Amio gtt. Discharged in NSR. Follow up Echo on 05/21/17 showed improved LV function with EF 35-40%. MVR was functioning appropriately.   Returns today for HF follow up. She was recently seen in Dr. Hassell Done office and her Amio was reduced from 400 mg BID to 200 mg BID as her QTc was .563 and EKG showed a junctional rhythm. She is feeling ok, working with physical therapy and doing well. Says she is not SOB with working with PT, does get dizzy at times when changing positions. Weights at home stable at 119-121 pounds. Denies chest pain, palpitations, PND and orthopnea.   Echo 05/22/17: LVEF 35-40%, stable MVR.  Echo 04/26/17 LVEF 25-30%, Stable MVR, Moderate TR, Peak PA pressure 38 mm, RV  moderately reduced function Echo TEE 03/04/17 LVEF normal, Mild MVP with regurgitant jets, predominant jet posteriorly, Severe LAE.  R/LHC 03/28/17  1. Severe single-vessel coronary artery disease with 60% proximal and 99% distal RCA stenoses as well as chronic total occlusion of the ostial rPDA. The rPDA andr PL branches are supplied by left-to-right collaterals. 2. 70% proximal D2 stenosis is evident. Otherwise, there is mild, nonobstructive disease involving the LAD, LCx, and their branches. 3. Normal left and right heart filling pressures. Normal pulmonary artery pressure. Normal Fick cardiac output/index. RHC Procedural Findings: Hemodynamics (mmHg) RA mean 4 RV 30/2 PA 28/8 PCWP 7 AO 107/45 Cardiac Output (Fick) 4.67 Cardiac Index (Fick) 2.78  Review of systems complete and found to be negative unless listed in HPI.    Past Medical History:  Diagnosis Date  . Allergy   . Anxiety   . Arthritis   . Atrial enlargement, left   . CHF (congestive heart failure) ([place])   . Coronary artery disease   . Depression   . Diabetes mellitus without complication (HCC)    fasting cbg 110s  . Dyspnea   . GERD (gastroesophageal reflux disease)   . H/O hiatal hernia   . Headache(784.0)   . Heart murmur    06/10/2014 seeing new cardiologist  . Heart murmur   . HOH (hard of hearing)    HOH in left ear; needs to speak to patient in right ear  . Hyperlipidemia   . Hypertension   . Mitral regurgitation   . Mitral valve prolapse   . Palpitations  afib on 03/04/17 EKG  . PONV (postoperative nausea and vomiting)    Patient stated "I like the patch behind my ear"  . 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 S/P mitral valve replacement with bioprosthetic valve 04/23/2017   [facility] Mitral bovine pericardial tissue valve: Model 7300TFX, Serial [id number], size 31  . UTI (lower urinary tract infection)     Current Outpatient Prescriptions  Medication Sig  Dispense Refill  . amiodarone (PACERONE) 200 MG tablet Take 1 tablet (200 mg total) by mouth 2 (two) times daily. 180 tablet 3  . aspirin EC 81 MG EC tablet Take 1 tablet (81 mg total) by mouth daily.    . bisacodyl (DULCOLAX) 5 MG EC tablet Take 10 mg by mouth daily as needed (for constipation.).    [name] citalopram (CELEXA) 20 MG tablet Take 1 tablet (20 mg total) by mouth daily. 90 tablet 3  . clonazePAM (KLONOPIN) 0.5 MG tablet Take one tablet by mouth PRN insomnia or anxiety. 20 tablet 0  . conjugated estrogens (PREMARIN) vaginal cream Place 1 Applicatorful vaginally daily. 42.5 g 12  . esomeprazole (NEXIUM) 20 MG capsule Take 20 mg by mouth daily at 12 noon.    . famotidine (PEPCID) 20 MG tablet Take 20 mg by mouth daily as needed for heartburn or indigestion.    . fexofenadine (ALLEGRA) 180 MG tablet Take 90 mg by mouth daily as needed for allergies or rhinitis (allergy headache).    . fluticasone (FLONASE) 50 MCG/ACT nasal spray SHAKE LIQUID AND USE 2 SPRAYS IN EACH NOSTRIL DAILY AS NEEDED FOR ALLERGIES 16 g 2  . HYDROcodone-acetaminophen (NORCO/VICODIN) 5-325 MG tablet Take 0.5 tablets by mouth every 6 (six) hours as needed for severe pain. 20 tablet 0  . magnesium oxide (MAG-OX) 400 (241.3 Mg) MG tablet Take 1 tablet (400 mg total) by mouth 2 (two) times daily. 60 tablet 0  . metFORMIN (GLUCOPHAGE) 500 MG tablet Take 2 tablets (1,000 mg total) by mouth 2 (two) times daily with a meal. 360 tablet 3  . ONETOUCH DELICA LANCETS 59D MISC USE TO TEST TWICE DAILY AS DIRECTED 100 each 0  . ONETOUCH VERIO test strip USE TO CHECK BLOOD GLUCOSE TWICE DAILY AS DIRECTED 100 each 3  . sacubitril-valsartan (ENTRESTO) 24-26 MG Take 1 tablet by mouth 2 (two) times daily. 60 tablet 1  . trimethoprim (TRIMPEX) 100 MG tablet TAKE 1 TABLET(100 MG) BY MOUTH DAILY 90 tablet 1  . warfarin (COUMADIN) 2.5 MG tablet Take 1 tablet (2.5 mg total) by mouth daily. Or as directed. 30 tablet 1  . Multiple Vitamins-Minerals  (ICAPS AREDS 2) CAPS Take 1 capsule by mouth 2 (two) times daily.     No current facility-administered medications for this encounter.     Allergies  Allergen Reactions  . Cortisone Other (See Comments)    Insomnia, heart palpitations (po only)  . Statins Other (See Comments)    Myalgias   . Codeine Swelling    FACIAL SWELLING SEVERITY UNKNOWN  . Sulfonamide Derivatives     UNSPECIFIED REACTION   . Amiodarone Other (See Comments)    Tremors with 400 mg BID   . Lactose Intolerance (Gi) Nausea And Vomiting      Social History   Social History  . Marital status: Married    Spouse name: N/A  . Number of children: N/A  . Years of education: N/A   Occupational History  .  Retired Retired   Social History Main Topics  . Smoking status: Never Smoker  . Smokeless tobacco: Never Used  . Alcohol use No  . Drug use: No  . Sexual activity: Not on file   Other Topics Concern  . Not on file   Social History Narrative   Married   Regular exercise      Family History  Problem Relation Age of Onset  . Heart failure Father   . Arthritis Father   . Hyperlipidemia Father   . Heart disease Father   . Arthritis Mother   . Hyperlipidemia Mother   . Hypertension Mother     Vitals:   05/31/17 1454  BP: (!) 124/58  Pulse: 68  SpO2: 98%  Weight: 121 lb 9.6 oz (55.2 kg)   Wt Readings from Last 3 Encounters:  05/31/17 121 lb 9.6 oz (55.2 kg)  05/28/17 121 lb (54.9 kg)  05/22/17 121 lb 3.2 oz (55 kg)   PHYSICAL EXAM: General:Elderly appearing. No resp difficulty. HEENT: Normal Neck: Supple. JVP 7-8 cm. Carotids 2+ bilat; no bruits. No thyromegaly or nodule noted. Cor: PMI nondisplaced. RRR, 2/6 systolic murmur.  Lungs: CTAB, normal effort. Abdomen: Soft, non-tender, non-distended, no HSM. No bruits or masses. +BS  Extremities: No cyanosis, clubbing, rash, trace pedal edema bilaterally.  Neuro: Alert & orientedx3, cranial nerves grossly intact. moves all 4  extremities w/o difficulty. Affect pleasant   ECG: Junctional rhythm. QTc 0.540  ASSESSMENT & PLAN:  1. Chronic Combined Biventricular HF: EF dropped post op-->EF 25-30% with RV dysfunction. Now improved to 35-40%. - NYHA III - Volume stable on exam - Continue Entresto 24/26 mg BID - Not on Lasix with recent AKI, no need for lasix now - No beta blocker.  - Will avoid Arlyce Harman with recent AKI.    2. CAD s/p CABG x1 04/23/17  - Denies chest pain  - Continue ASA and statin.   3. MVR - bioprosthetic valve and LA clipping.  - Stable by recent Echo - Continue warfarin. [facility]. Recent INR was 8, now down to 3.3.   4. Paroxysmal  A fib  - EKG with junctional rhythm - She tolerates Afib poorly, however with prolonged Qtc will decrease Amio to 200 mg daily.  - Reminded her to get yearly eye exams.   Follow up one month with Dr. Peri Maris, NP 05/31/17

## 2017-06-01 LAB — BASIC METABOLIC PANEL
BUN/Creatinine Ratio: 18 (ref 12–28)
BUN: 15 mg/dL (ref 8–27)
CALCIUM: 8.9 mg/dL (ref 8.7–10.3)
CHLORIDE: 97 mmol/L (ref 96–106)
CO2: 19 mmol/L — AB (ref 20–29)
Creatinine, Ser: 0.85 mg/dL (ref 0.57–1.00)
GFR calc non Af Amer: 64 mL/min/{1.73_m2} (ref >59)
GFR, EST AFRICAN AMERICAN: 74 mL/min/{1.73_m2} (ref >59)
GLUCOSE: 113 mg/dL — AB (ref 65–99)
POTASSIUM: 5 mmol/L (ref 3.5–5.2)
Sodium: 132 mmol/L — ABNORMAL LOW (ref 134–144)

## 2017-06-02 ENCOUNTER — Other Ambulatory Visit: Payer: Self-pay | Admitting: Family Medicine

## 2017-06-03 ENCOUNTER — Telehealth: Payer: Self-pay | Admitting: Physician Assistant

## 2017-06-03 ENCOUNTER — Telehealth (HOSPITAL_COMMUNITY): Payer: Self-pay

## 2017-06-03 ENCOUNTER — Telehealth: Payer: Self-pay | Admitting: *Deleted

## 2017-06-03 DIAGNOSIS — I4819 Other persistent atrial fibrillation: Secondary | ICD-10-CM

## 2017-06-03 DIAGNOSIS — I10 Essential (primary) hypertension: Secondary | ICD-10-CM

## 2017-06-03 DIAGNOSIS — I2510 Atherosclerotic heart disease of native coronary artery without angina pectoris: Secondary | ICD-10-CM

## 2017-06-03 DIAGNOSIS — I5022 Chronic systolic (congestive) heart failure: Secondary | ICD-10-CM

## 2017-06-03 DIAGNOSIS — Z953 Presence of xenogenic heart valve: Secondary | ICD-10-CM

## 2017-06-03 NOTE — Telephone Encounter (Signed)
I called patient to obtain information and to see if patient was currently receiving HomeHealth physical therapy at this time. Patient informed me that she has not currently received any PT services at this time. Patient stated that she has been waiting and know one has called her. Patient informed to contact her cardiologist office  to assist with these services that was ordered at discharge. Patient stated that she would contact her cardiologist office for assistance. Patient also asked me to go over how much it would cost for her to come to cardiac rehab. Patient given co-pay information and declined at this time. Referral closed until further notice.

## 2017-06-03 NOTE — Telephone Encounter (Signed)
DPR ok to s/w pt's husband who has been notified of lab results by phone with verbal understanding.

## 2017-06-03 NOTE — Telephone Encounter (Signed)
DPR ok to s/w pt's husband. I called and s/w pt's husband. I advised per Brynda Rim. PA pt will need to have an EKG due to HF Clinic decreased Amiodarone to 200 mg once a day. I advised pt's husband, Nicki Reaper would like to have EKG done either Friday this week 06/07/17 or next Monday 06/10/17. Mr. Moger states Friday 06/07/17 would be better since pt has CVRR appt already same day. I advised Mr. Moustafa I will try to arrange this with the scheduler tomorrow and call them back tomorrow to advise of time. Mr. Benecke thanked me for my call.

## 2017-06-03 NOTE — Telephone Encounter (Signed)
Please arrange follow up ECG in our office Friday of this week or Monday of next week to follow up on her heart rhythm. Richardson Dopp, PA-C    06/03/2017 2:58 PM

## 2017-06-03 NOTE — Telephone Encounter (Signed)
-----   Message from Liliane Shi, [place] sent at 06/03/2017  5:09 AM EDT ----- Please call the patient Kidney function is normal.  The sodium is improved. Continue with current treatment plan. Richardson Dopp, PA-C   06/03/2017 5:09 AM

## 2017-06-03 NOTE — Telephone Encounter (Signed)
DPR ok to s/w pt's husband who called to advise pt would like to start Cardiac Rehab. I advised Mr. Heuerman I will place the referral in to [place] and they will call them to schedule Mr.[facility] for Cardiac Rehab. Mr. Legore thanked me for calling him back today.

## 2017-06-03 NOTE — Telephone Encounter (Signed)
New message   Pt husband states pt now wants to get referred to cardiac rehab. Michela Pitcher it was discussed with Richardson Dopp. Requests call back.

## 2017-06-04 ENCOUNTER — Ambulatory Visit: Payer: Medicare Other | Admitting: Family Medicine

## 2017-06-04 NOTE — Telephone Encounter (Signed)
DPR ok to s/w pt's husband who has been notified of Nurse Visit scheduled for Friday 06/07/17 @ 12:15. Pt will see CVRR first at 11:50 then pt will have Nurse Visit for EKG due to Amiodarone decreased to 200 mg daily. Pt's husband thanked me for my help in this matter.

## 2017-06-04 NOTE — Telephone Encounter (Signed)
Lmtcb to advise pt and her husband nurse visit scheduled for EKG to be done 06/07/17 @ 12:15. Pt has CVRR appt 06/07/17 @ 11:50.

## 2017-06-05 ENCOUNTER — Other Ambulatory Visit: Payer: Self-pay | Admitting: *Deleted

## 2017-06-05 DIAGNOSIS — M1991 Primary osteoarthritis, unspecified site: Secondary | ICD-10-CM | POA: Diagnosis not present

## 2017-06-05 DIAGNOSIS — I2510 Atherosclerotic heart disease of native coronary artery without angina pectoris: Secondary | ICD-10-CM | POA: Diagnosis not present

## 2017-06-05 DIAGNOSIS — Z951 Presence of aortocoronary bypass graft: Secondary | ICD-10-CM

## 2017-06-05 DIAGNOSIS — M419 Scoliosis, unspecified: Secondary | ICD-10-CM | POA: Diagnosis not present

## 2017-06-05 DIAGNOSIS — I5082 Biventricular heart failure: Secondary | ICD-10-CM | POA: Diagnosis not present

## 2017-06-05 DIAGNOSIS — I481 Persistent atrial fibrillation: Secondary | ICD-10-CM | POA: Diagnosis not present

## 2017-06-05 DIAGNOSIS — Z48812 Encounter for surgical aftercare following surgery on the circulatory system: Secondary | ICD-10-CM | POA: Diagnosis not present

## 2017-06-05 DIAGNOSIS — I5023 Acute on chronic systolic (congestive) heart failure: Secondary | ICD-10-CM | POA: Diagnosis not present

## 2017-06-05 DIAGNOSIS — I110 Hypertensive heart disease with heart failure: Secondary | ICD-10-CM | POA: Diagnosis not present

## 2017-06-05 DIAGNOSIS — H3530 Unspecified macular degeneration: Secondary | ICD-10-CM | POA: Diagnosis not present

## 2017-06-05 DIAGNOSIS — M47817 Spondylosis without myelopathy or radiculopathy, lumbosacral region: Secondary | ICD-10-CM | POA: Diagnosis not present

## 2017-06-05 DIAGNOSIS — E119 Type 2 diabetes mellitus without complications: Secondary | ICD-10-CM | POA: Diagnosis not present

## 2017-06-05 DIAGNOSIS — Z952 Presence of prosthetic heart valve: Secondary | ICD-10-CM

## 2017-06-07 ENCOUNTER — Other Ambulatory Visit: Payer: Self-pay | Admitting: Thoracic Surgery (Cardiothoracic Vascular Surgery)

## 2017-06-07 ENCOUNTER — Ambulatory Visit (INDEPENDENT_AMBULATORY_CARE_PROVIDER_SITE_OTHER): Payer: Medicare Other | Admitting: Physician Assistant

## 2017-06-07 ENCOUNTER — Ambulatory Visit (INDEPENDENT_AMBULATORY_CARE_PROVIDER_SITE_OTHER): Payer: Medicare Other

## 2017-06-07 ENCOUNTER — Encounter: Payer: Self-pay | Admitting: Physician Assistant

## 2017-06-07 VITALS — BP 120/54 | HR 59

## 2017-06-07 DIAGNOSIS — I2510 Atherosclerotic heart disease of native coronary artery without angina pectoris: Secondary | ICD-10-CM | POA: Diagnosis not present

## 2017-06-07 DIAGNOSIS — Z951 Presence of aortocoronary bypass graft: Secondary | ICD-10-CM

## 2017-06-07 DIAGNOSIS — I059 Rheumatic mitral valve disease, unspecified: Secondary | ICD-10-CM | POA: Diagnosis not present

## 2017-06-07 DIAGNOSIS — I498 Other specified cardiac arrhythmias: Secondary | ICD-10-CM

## 2017-06-07 DIAGNOSIS — I4891 Unspecified atrial fibrillation: Secondary | ICD-10-CM | POA: Diagnosis not present

## 2017-06-07 DIAGNOSIS — Z7901 Long term (current) use of anticoagulants: Secondary | ICD-10-CM

## 2017-06-07 DIAGNOSIS — Z953 Presence of xenogenic heart valve: Secondary | ICD-10-CM

## 2017-06-07 DIAGNOSIS — I4819 Other persistent atrial fibrillation: Secondary | ICD-10-CM

## 2017-06-07 DIAGNOSIS — I481 Persistent atrial fibrillation: Secondary | ICD-10-CM

## 2017-06-07 DIAGNOSIS — I5022 Chronic systolic (congestive) heart failure: Secondary | ICD-10-CM

## 2017-06-07 LAB — POCT INR: INR: 2.2

## 2017-06-07 MED ORDER — WARFARIN SODIUM 2.5 MG PO TABS: ORAL_TABLET | ORAL | 2 refills | Status: DC

## 2017-06-07 NOTE — Addendum Note (Signed)
Addended by: Eulis Foster on: 06/07/2017 01:31 PM   Modules accepted: Orders

## 2017-06-07 NOTE — Progress Notes (Signed)
Cardiology Office Note:    Date:  06/07/2017   ID:  [name], DOB 08/28/1934, MRN [id number]  PCP:  Eulas Post, MD  Cardiologist:  Dr. Dorris Carnes    Referring MD: Eulas Post, MD   Chief Complaint  Patient presents with  . Junctional Rhythm    History of Present Illness:    [name] is a 83 y.o. female with a hx of mitral valve prolapse with mitral regurgitation, CAD, HTN, diabetes, HL. In June 2018, the patient noted worsening shortness of breath felt to be related to mitral regurgitation.  Cardiac catheterization demonstrated severe single vessel CAD with 60% proximal, 99% distal RCA stenosis and chronic total occlusion of the ostial RPDA. The RPDA and PL branches are supplied by left to right collaterals. In addition, there is 70% proximal D2 stenosis and otherwise mild nonobstructive disease in the LAD and LCx. She had atrial fibrillation documented and was placed on Xarelto for anticoagulation. She was evaluated by Dr. Roxy Manns and underwent CABG times one (SVG-distal RCA), bioprosthetic mitral valve replacement and left atrial appendage clipping 04/23/17. Postoperative course was notable for systolic heart failure secondary to worsening LV function with an EF 25-30%. She was followed by the heart failure team and required inotropic support with milrinone. She was placed on Coumadin. She developed atrial fibrillation and was placed on amiodarone. After discharge, she was seen back in the congestive heart failure clinic 05/15/17. She was back in atrial fibrillation at that time. Amiodarone dose was increased. She was then admitted 9/8-9/12 with left upper quadrant pressure, fatigue, nausea and AKI and AF with RVR. She was thought to have low output symptoms and was placed back on milrinone. She converted back to NSR on IV amiodarone. She was transitioned back to oral amiodarone. Follow-up echo 05/21/17 demonstrated improved LV function with EF 35-40%. MVR was functioning  appropriately.  I saw her 9/18.  She was in a junctional rhythm.  I cut back on her Amio to 200 bid.  She then was seen in the HF Clinic and she was still in junctional rhythm.  Her Amio was reduced to 200 QD.  She returns for an ECG with the RN.  She is still in junctional rhythm and is feeling "awful."  She was added on to my schedule for evaluation.    [name] is here today with her husband. She is seen along with Dr. Harrington Challenger. The patient continues to feel poorly. She is weak. She denies syncope. She has been short of breath. She denies PND. She denies significant chest pain. She denies any bleeding issues. She denies edema.  Prior CV studies:   The following studies were reviewed today:  Echocardiogram 05/21/17 Mild LVH, EF 35-40, apical akinesis, trivial AI, bioprosthetic MVR with trace MR, severe LAE, PASP 34  Limited echocardiogram 04/26/17 Mild concentric LVH, EF 25-30, diffuse HK, bioprosthetic MVR functioning normally, moderately reduced RVSF, moderate TR, PASP 38  TEE 04/23/17 EF 55-60  Carotid US 04/19/17 Bilateral ICA 1-39  Cardiac catheterization 03/28/17 LAD mid 20, D1 ostial 30, D2 70 LCx mild disease RCA proximal 60, distal 99, RPDA 100, RPAVB 70 Mean RA 5, mean PA 17, mean PCWP 12 LVEDP 16  Echocardiogram 02/12/17 EF 55-60, normal wall motion, moderate late systolic prolapse involving the anterior leaflet and posterior leaflet with at least moderate MR  Past Medical History:  Diagnosis Date  . Allergy   . Anxiety   . Arthritis   . Atrial enlargement,  left   . CHF (congestive heart failure) ([place])   . Coronary artery disease   . Depression   . Diabetes mellitus without complication (HCC)    fasting cbg 110s  . Dyspnea   . GERD (gastroesophageal reflux disease)   . H/O hiatal hernia   . Headache(784.0)   . Heart murmur    06/10/2014 seeing new cardiologist  . Heart murmur   . HOH (hard of hearing)    HOH in left ear; needs to speak to patient in right ear   . Hyperlipidemia   . Hypertension   . Mitral regurgitation   . Mitral valve prolapse   . Palpitations    afib on 03/04/17 EKG  . PONV (postoperative nausea and vomiting)    Patient stated "I like the patch behind my ear"  . 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 S/P mitral valve replacement with bioprosthetic valve 04/23/2017   [facility] Mitral bovine pericardial tissue valve: Model 7300TFX, Serial [id number], size 31  . UTI (lower urinary tract infection)     Past Surgical History:  Procedure Laterality Date  . ABDOMINAL HYSTERECTOMY    . Anterior posterior and enterocele repairs  09/21/2004   With uterosacral cardinal colposuspension, partial colpocleisis; Selinda Orion, MD  . BACK SURGERY    . BREAST ENHANCEMENT SURGERY  02/25/2002   Bilateral reduction and excision of accessory breast tissue underneath the left breast; Aretha Parrot., MD  . BREAST SURGERY  11   reduction  . CARDIAC CATHETERIZATION     no PCI  . CARDIAC CATHETERIZATION  05/02/2017  . CATARACT EXTRACTION Bilateral   . CLIPPING OF ATRIAL APPENDAGE  04/23/2017   Procedure: CLIPPING OF LEFT ATRIAL APPENDAGE;  Surgeon: [name], MD;  Location: [place];  Service: Open Heart Surgery;;  . COLONOSCOPY    . CORONARY ARTERY BYPASS GRAFT N/A 04/23/2017   Procedure: CORONARY ARTERY BYPASS GRAFTING (CABG), ON PUMP, TIMES ONE, USING ENDOSCOPICALLY HARVESTED RIGHT GREATER SAPHENOUS VEIN;  Surgeon: [name], MD;  Location: [place];  Service: Open Heart Surgery;  Laterality: N/A;  . EYE SURGERY Bilateral    cataract removal  . LUMBAR LAMINECTOMY/ DECOMPRESSION WITH MET-RX N/A 03/16/2014   Procedure: LUMBAR FIVE-SACRAL ONE EXTRAFORAMINAL DISKECTOMY WITH METREX;  Surgeon: Kristeen Miss, MD;  Location: [place] [facility];  Service: Neurosurgery;  Laterality: N/A;  . LUMBAR LAMINECTOMY/DECOMPRESSION MICRODISCECTOMY Left 06/08/2014   Procedure: Left Lumbar Five-Sacral One Microdiskectomy;   Surgeon: Kristeen Miss, MD;  Location: [place] [facility];  Service: Neurosurgery;  Laterality: Left;  Left L5-S1 Microdiskectomy  . MITRAL VALVE REPLACEMENT N/A 04/23/2017   Procedure: MITRAL VALVE (MV) REPLACEMENT;  Surgeon: [name], MD;  Location: [place];  Service: Open Heart Surgery;  Laterality: N/A;  . PELVIC FLOOR REPAIR    . POSTERIOR LUMBAR FUSION 4 LEVEL N/A 08/08/2015   Procedure: T8-L1 posterior lateral fusion with decompression T12-L1;  Surgeon: Kristeen Miss, MD;  Location: [place] [facility];  Service: Neurosurgery;  Laterality: N/A;  T8-L1 posterior lateral fusion with decompression T12-L1  . RIGHT/LEFT HEART CATH AND CORONARY ANGIOGRAPHY N/A 03/28/2017   Procedure: Right/Left Heart Cath and Coronary Angiography;  Surgeon: Nelva Bush, MD;  Location: [place] CV LAB;  Service: Cardiovascular;  Laterality: N/A;  . ROTATOR CUFF REPAIR Bilateral    11.12  . TEE WITHOUT CARDIOVERSION N/A 03/04/2017   Procedure: TRANSESOPHAGEAL ECHOCARDIOGRAM (TEE);  Surgeon: Fay Records, MD;  Location: [facility];  Service: Cardiovascular;  Laterality: N/A;  . TEE WITHOUT CARDIOVERSION N/A 04/23/2017   Procedure: TRANSESOPHAGEAL ECHOCARDIOGRAM (TEE);  Surgeon: [name], MD;  Location: [place];  Service: Open Heart Surgery;  Laterality: N/A;  . TONSILLECTOMY AND ADENOIDECTOMY      Current Medications: Current Meds  Medication Sig  . amiodarone (PACERONE) 200 MG tablet Take 1 tablet (200 mg total) by mouth daily.  [name] aspirin EC 81 MG EC tablet Take 1 tablet (81 mg total) by mouth daily.  . bisacodyl (DULCOLAX) 5 MG EC tablet Take 10 mg by mouth daily as needed (for constipation.).  [name] citalopram (CELEXA) 20 MG tablet Take 1 tablet (20 mg total) by mouth daily.  . clonazePAM (KLONOPIN) 0.5 MG tablet Take one tablet by mouth PRN insomnia or anxiety.  . conjugated estrogens (PREMARIN) vaginal cream Place 1 Applicatorful vaginally daily.  [name] esomeprazole (NEXIUM) 20 MG capsule Take 20 mg by mouth  daily at 12 noon.  . famotidine (PEPCID) 20 MG tablet Take 20 mg by mouth daily as needed for heartburn or indigestion.  . fexofenadine (ALLEGRA) 180 MG tablet Take 90 mg by mouth daily as needed for allergies or rhinitis (allergy headache).  . fluticasone (FLONASE) 50 MCG/ACT nasal spray SHAKE LIQUID AND USE 2 SPRAYS IN EACH NOSTRIL DAILY AS NEEDED FOR ALLERGIES  . HYDROcodone-acetaminophen (NORCO/VICODIN) 5-325 MG tablet Take 0.5 tablets by mouth every 6 (six) hours as needed for severe pain.  . magnesium oxide (MAG-OX) 400 (241.3 Mg) MG tablet Take 1 tablet (400 mg total) by mouth 2 (two) times daily.  . metFORMIN (GLUCOPHAGE) 500 MG tablet Take 2 tablets (1,000 mg total) by mouth 2 (two) times daily with a meal.  . Multiple Vitamins-Minerals (ICAPS AREDS 2) CAPS Take 1 capsule by mouth 2 (two) times daily.  Glory Rosebush DELICA LANCETS 30Z MISC USE TO TEST TWICE DAILY AS DIRECTED  . ONETOUCH VERIO test strip USE TO CHECK BLOOD GLUCOSE TWICE DAILY AS DIRECTED  . sacubitril-valsartan (ENTRESTO) 24-26 MG Take 1 tablet by mouth 2 (two) times daily.  [name] trimethoprim (TRIMPEX) 100 MG tablet TAKE 1 TABLET(100 MG) BY MOUTH DAILY  . warfarin (COUMADIN) 2.5 MG tablet Take as directed by Coumadin Clinic     Allergies:   Cortisone; Statins; Codeine; Sulfonamide derivatives; Amiodarone; and Lactose intolerance (gi)   Social History   Social History  . Marital status: Married    Spouse name: N/A  . Number of children: N/A  . Years of education: N/A   Occupational History  . Retired Retired   Social History Main Topics  . Smoking status: Never Smoker  . Smokeless tobacco: Never Used  . Alcohol use No  . Drug use: No  . Sexual activity: Not on file   Other Topics Concern  . Not on file   Social History Narrative   Married   Regular exercise     Family Hx: The patient's family history includes Arthritis in her father and mother; Heart disease in her father; Heart failure in her father;  Hyperlipidemia in her father and mother; Hypertension in her mother.  ROS:   Please see the history of present illness.    ROS All other systems reviewed and are negative.   EKGs/Labs/Other Test Reviewed:    EKG:  EKG is  ordered today.  The ekg ordered today demonstrates Junctional rhythm, HR 59, QTc 512 ms  Recent Labs: 05/18/2017: B Natriuretic Peptide 3,509.1; TSH 1.716 05/19/2017: ALT 20 05/28/2017: Hemoglobin  11.6; Magnesium 1.5; Platelets 375 05/31/2017: BUN 15; Creatinine, Ser 0.85; Potassium 5.0; Sodium 132   Recent Lipid Panel Lab Results  Component Value Date/Time   CHOL 163 08/22/2016 07:46 AM   TRIG 101 08/22/2016 07:46 AM   HDL 65 08/22/2016 07:46 AM   CHOLHDL 2.5 08/22/2016 07:46 AM   LDLCALC 78 08/22/2016 07:46 AM   LDLDIRECT 143.6 06/04/2012 09:31 AM    Physical Exam:    VS:  BP (!) 120/54 (BP Location: Left Arm, Patient Position: Sitting, Cuff Size: Normal)   Pulse (!) 59   SpO2 98%     Wt Readings from Last 3 Encounters:  05/31/17 121 lb 9.6 oz (55.2 kg)  05/28/17 121 lb (54.9 kg)  05/22/17 121 lb 3.2 oz (55 kg)     Physical Exam  Constitutional: She is oriented to person, place, and time. She appears well-developed and well-nourished. No distress.  HENT:  Head: Normocephalic and atraumatic.  Eyes: No scleral icterus.  Neck: No JVD (at 90) present.  Cardiovascular: Normal rate and regular rhythm.   No murmur heard. Pulmonary/Chest: Effort normal. She has no rales.  Abdominal: Soft.  Musculoskeletal: She exhibits no edema.  Neurological: She is alert and oriented to person, place, and time. She displays tremor.  Skin: Skin is warm and dry.  Psychiatric: Her affect is blunt.    ASSESSMENT:    1. Junctional rhythm   2. Persistent atrial fibrillation ([place])   3. Chronic systolic heart failure ([place])   4. Coronary artery disease involving native coronary artery of native heart without angina pectoris    PLAN:    In order of problems listed  above:  1. Junctional rhythm  She remains in junctional rhythm. She seems to be symptomatic from this. As noted, I reviewed her case with Dr. Harrington Challenger who also saw the patient. We will hold her amiodarone for now. Obtain CMET today. Dr. Harrington Challenger will review further with EP as well as the heart failure team. She will contact the patient next week regarding when to resume amiodarone.  2. Persistent atrial fibrillation ([place]) - Plan: EKG 12-Lead She does not tolerate atrial fibrillation well. She had a very large loading dose of amiodarone (400 bid). She is now in junctional rhythm. She is symptomatic from junctional rhythm. Amiodarone will be stopped. She remains on Coumadin. She will be contacted next week by Dr. Harrington Challenger to determine when to resume amiodarone.  3. Chronic systolic heart failure ([place]) - Plan: Comprehensive metabolic panel, NT-proBNP Volume appears stable. She is followed by the heart failure team. She is not on spironolactone secondary to history of acute kidney injury in the past. She is not on beta blocker secondary to junctional rhythm. She remains on Entresto. Obtain CMET, BNP today.  4. Coronary artery disease involving native coronary artery of native heart without angina pectoris  Status post single vessel CABG. She is doing well without angina.  Dispo:  Return in about 2 weeks (around 06/21/2017) for Scheduled Follow Up, w/ Richardson Dopp, PA-C.   Medication Adjustments/Labs and Tests Ordered: Current medicines are reviewed at length with the patient today.  Concerns regarding medicines are outlined above.  Tests Ordered: Orders Placed This Encounter  Procedures  . Comprehensive metabolic panel  . NT-proBNP  . EKG 12-Lead   Medication Changes: No orders of the defined types were placed in this encounter.   Signed, Richardson Dopp, PA-C  06/07/2017 1:28 PM    [facility] [place],  [place]  [address] Phone: [phone]; Fax: [phone]

## 2017-06-07 NOTE — Patient Instructions (Signed)
Your physician has recommended you make the following change in your medication:  1.) stop amiodarone until you hear from Dr. Harrington Challenger  Your physician recommends that you return for lab work in: today (CMET, BNP)  Keep your already scheduled follow up appointments.

## 2017-06-07 NOTE — Addendum Note (Signed)
Addended by: Eulis Foster on: 06/07/2017 01:30 PM   Modules accepted: Orders

## 2017-06-08 ENCOUNTER — Other Ambulatory Visit: Payer: Self-pay | Admitting: Physician Assistant

## 2017-06-08 LAB — COMPREHENSIVE METABOLIC PANEL
A/G RATIO: 1.1 — AB (ref 1.2–2.2)
ALBUMIN: 3.4 g/dL — AB (ref 3.5–4.7)
ALK PHOS: 95 IU/L (ref 39–117)
ALT: 9 IU/L (ref 0–32)
AST: 16 IU/L (ref 0–40)
BUN/Creatinine Ratio: 20 (ref 12–28)
BUN: 15 mg/dL (ref 8–27)
CHLORIDE: 101 mmol/L (ref 96–106)
CO2: 18 mmol/L — ABNORMAL LOW (ref 20–29)
Calcium: 8.7 mg/dL (ref 8.7–10.3)
Creatinine, Ser: 0.76 mg/dL (ref 0.57–1.00)
GFR calc Af Amer: 85 mL/min/{1.73_m2} (ref >59)
GFR calc non Af Amer: 74 mL/min/{1.73_m2} (ref >59)
GLOBULIN, TOTAL: 3 g/dL (ref 1.5–4.5)
Glucose: 127 mg/dL — ABNORMAL HIGH (ref 65–99)
Potassium: 4.1 mmol/L (ref 3.5–5.2)
SODIUM: 134 mmol/L (ref 134–144)
Total Protein: 6.4 g/dL (ref 6.0–8.5)

## 2017-06-08 LAB — PRO B NATRIURETIC PEPTIDE: NT-PRO BNP: 2672 pg/mL — AB (ref 0–738)

## 2017-06-10 ENCOUNTER — Ambulatory Visit
Admission: RE | Admit: 2017-06-10 | Discharge: 2017-06-10 | Disposition: A | Discharge status: Home / Self Care | Payer: Medicare Other | Source: Ambulatory Visit | Attending: Thoracic Surgery (Cardiothoracic Vascular Surgery) | Admitting: Thoracic Surgery (Cardiothoracic Vascular Surgery)

## 2017-06-10 ENCOUNTER — Encounter: Payer: Self-pay | Admitting: Thoracic Surgery (Cardiothoracic Vascular Surgery)

## 2017-06-10 ENCOUNTER — Encounter: Payer: Self-pay | Admitting: Family Medicine

## 2017-06-10 ENCOUNTER — Ambulatory Visit (INDEPENDENT_AMBULATORY_CARE_PROVIDER_SITE_OTHER): Payer: Self-pay | Admitting: Thoracic Surgery (Cardiothoracic Vascular Surgery)

## 2017-06-10 ENCOUNTER — Telehealth: Payer: Self-pay | Admitting: *Deleted

## 2017-06-10 ENCOUNTER — Ambulatory Visit (INDEPENDENT_AMBULATORY_CARE_PROVIDER_SITE_OTHER): Payer: Medicare Other | Admitting: Family Medicine

## 2017-06-10 ENCOUNTER — Telehealth (HOSPITAL_COMMUNITY): Payer: Self-pay | Admitting: Pharmacist

## 2017-06-10 VITALS — BP 102/52 | HR 61 | Temp 97.6°F

## 2017-06-10 VITALS — BP 122/71 | HR 57 | Ht 65.5 in | Wt 120.0 lb

## 2017-06-10 DIAGNOSIS — Z951 Presence of aortocoronary bypass graft: Secondary | ICD-10-CM

## 2017-06-10 DIAGNOSIS — J9811 Atelectasis: Secondary | ICD-10-CM | POA: Diagnosis not present

## 2017-06-10 DIAGNOSIS — R300 Dysuria: Secondary | ICD-10-CM

## 2017-06-10 DIAGNOSIS — I5022 Chronic systolic (congestive) heart failure: Secondary | ICD-10-CM

## 2017-06-10 DIAGNOSIS — Z953 Presence of xenogenic heart valve: Secondary | ICD-10-CM

## 2017-06-10 DIAGNOSIS — I059 Rheumatic mitral valve disease, unspecified: Secondary | ICD-10-CM

## 2017-06-10 DIAGNOSIS — I080 Rheumatic disorders of both mitral and aortic valves: Secondary | ICD-10-CM

## 2017-06-10 LAB — POCT URINALYSIS DIPSTICK
BILIRUBIN UA: NEGATIVE
Glucose, UA: NEGATIVE
KETONES UA: 15
NITRITE UA: POSITIVE
PH UA: 6 (ref 5.0–8.0)
Protein, UA: 100
Spec Grav, UA: >=1.03 — AB (ref 1.010–1.025)
Urobilinogen, UA: 0.2 E.U./dL

## 2017-06-10 MED ORDER — CEPHALEXIN 500 MG PO CAPS: 500.0000 mg | ORAL_CAPSULE | Freq: Three times a day (TID) | ORAL | 0 refills | Status: DC

## 2017-06-10 MED ORDER — FUROSEMIDE 20 MG PO TABS: 20.0000 mg | ORAL_TABLET | ORAL | 3 refills | Status: DC

## 2017-06-10 MED ORDER — POTASSIUM CHLORIDE ER 10 MEQ PO TBCR: 10.0000 meq | EXTENDED_RELEASE_TABLET | ORAL | 3 refills | Status: DC

## 2017-06-10 MED ORDER — CITALOPRAM HYDROBROMIDE 20 MG PO TABS: 20.0000 mg | ORAL_TABLET | Freq: Every day | ORAL | 3 refills | Status: DC

## 2017-06-10 NOTE — Progress Notes (Signed)
[address]       [place] [address]             [phone]     CARDIOTHORACIC SURGERY OFFICE NOTE  Referring Provider is Fay Records, MD PCP is Eulas Post, MD   HPI:  Patient is an 83 year old female with history of mitral valve prolapse and mitral regurgitation, chronic diastolic congestive heart failure, coronary artery disease, hypertension, type 2 diabetes mellitus without complications, and scoliosis of the back with degenerative disc disease status post lumbar fusion who returns to the office today for routine follow-up status post mitral valve replacement using a bioprosthetic tissue valve, coronary artery bypass grafting 1, and clipping of the left atrial appendage on 04/23/2017.  The patient's early postoperative recovery in the hospital was notable for the development of atrial fibrillation and acute on chronic diastolic congestive heart failure. Follow-up echocardiogram from normal preoperatively to 25-30% after mitral valve replacement. She eventually converted back to sinus rhythm on amiodarone.  She slowly progressed and was eventually discharged home on the 15th postoperative day.  She was readmitted to the hospital briefly in mid September with recurrence of atrial fibrillation with rapid ventricular response and acute renal insufficiency. Follow-up echocardiogram performed 05/21/2017 revealed improved left ventricular systolic function with ejection fraction estimated 35-40%. The mitral valve prosthesis was functioning normally. Medications were adjusted and she was discharged home after a 3 day stay.  Most recently she was seen by Richardson Dopp on 06/07/2017.  At that time she remained in junctional rhythm and complaining of increased resting tremor. Amiodarone was stopped.  Blood work at that time was notable for creatinine back down to baseline at 0.76. Heart failure medications were not adjusted. She returns to our office for routine follow-up  today.  She has a follow-up appointment with Dr. Haroldine Laws in the Advanced Heart Failure clinic later this month.  The patient and her husband both report that she is making slow but steady progress. She denies any shortness of breath. She has no significant pain in her chest. She still feels quite weak. She has not had palpitations or dizzy spells. Appetite is marginal but slowly improving. She enjoys her husband's cooking.   Current Outpatient Prescriptions  Medication Sig Dispense Refill  . aspirin EC 81 MG EC tablet Take 1 tablet (81 mg total) by mouth daily.    . bisacodyl (DULCOLAX) 5 MG EC tablet Take 10 mg by mouth daily as needed (for constipation.).    [name] citalopram (CELEXA) 20 MG tablet Take 1 tablet (20 mg total) by mouth daily. 90 tablet 3  . clonazePAM (KLONOPIN) 0.5 MG tablet Take one tablet by mouth PRN insomnia or anxiety. 20 tablet 0  . conjugated estrogens (PREMARIN) vaginal cream Place 1 Applicatorful vaginally daily. 42.5 g 12  . esomeprazole (NEXIUM) 20 MG capsule Take 20 mg by mouth daily at 12 noon.    . famotidine (PEPCID) 20 MG tablet Take 20 mg by mouth daily as needed for heartburn or indigestion.    . fexofenadine (ALLEGRA) 180 MG tablet Take 90 mg by mouth daily as needed for allergies or rhinitis (allergy headache).    . fluticasone (FLONASE) 50 MCG/ACT nasal spray SHAKE LIQUID AND USE 2 SPRAYS IN EACH NOSTRIL DAILY AS NEEDED FOR ALLERGIES 16 g 2  . HYDROcodone-acetaminophen (NORCO/VICODIN) 5-325 MG tablet Take 0.5 tablets by mouth every 6 (six) hours as needed for severe pain. 20 tablet 0  . magnesium oxide (MAG-OX)  400 (241.3 Mg) MG tablet Take 1 tablet (400 mg total) by mouth 2 (two) times daily. 60 tablet 0  . metFORMIN (GLUCOPHAGE) 500 MG tablet Take 2 tablets (1,000 mg total) by mouth 2 (two) times daily with a meal. 360 tablet 3  . Multiple Vitamins-Minerals (ICAPS AREDS 2) CAPS Take 1 capsule by mouth 2 (two) times daily.    Glory Rosebush DELICA LANCETS 02D MISC  USE TO TEST TWICE DAILY AS DIRECTED 100 each 0  . ONETOUCH VERIO test strip USE TO CHECK BLOOD GLUCOSE TWICE DAILY AS DIRECTED 100 each 3  . sacubitril-valsartan (ENTRESTO) 24-26 MG Take 1 tablet by mouth 2 (two) times daily. 60 tablet 11  . trimethoprim (TRIMPEX) 100 MG tablet TAKE 1 TABLET(100 MG) BY MOUTH DAILY 90 tablet 0  . warfarin (COUMADIN) 2.5 MG tablet Take as directed by [facility] 30 tablet 2   No current facility-administered medications for this visit.       Physical Exam:   BP 122/71   Pulse (!) 57   Ht 5' 5.5" (1.664 m)   Wt 120 lb (54.4 kg)   SpO2 98%   BMI 19.67 kg/m   General:  Thin and frail but well-appearing  Chest:   Clear to auscultation with symmetrical breath sounds  CV:   Regular rate and rhythm without murmur  Incisions:  Healing nicely, sternum is stable  Abdomen:  Soft nontender  Extremities:  Warm and well-perfused, no lower extremity edema  Diagnostic Tests:  Transthoracic Echocardiography  Patient:    Gustie, Bobb MR #:       [id number] Study Date: 05/21/2017 Gender:     F Age:        12 Height:     170.2 cm Weight:     54.4 kg BSA:        1.6 m^2 Pt. Status: Room:       [place], [place]  [place], MD  PERFORMING   [facility], Inpatient  SONOGRAPHER  Darlina Sicilian, RDCS  ORDERING     Shirley Friar  REFERRING    Shirley Friar  cc:  ------------------------------------------------------------------- LV EF: 35% -   40%  ------------------------------------------------------------------- Indications:      CHF - 428.0.  ------------------------------------------------------------------- History:   PMH:   Atrial fibrillation.  Coronary artery disease. PMH:  Cardiogenic Shock.  Risk factors:  Hypertension. Dyslipidemia.  ------------------------------------------------------------------- Study Conclusions  - Left ventricle: The cavity size was  normal. Wall thickness was   increased in a pattern of mild LVH. Systolic function was   moderately reduced. The estimated ejection fraction was in the   range of 35% to 40%. There is akinesis of the apical myocardium. - Aortic valve: There was trivial regurgitation. - Mitral valve: A bioprosthesis was present. - Left atrium: The atrium was severely dilated. - Pulmonary arteries: PA peak pressure: 34 mm Hg (S).  Impressions:  - Apical akinesis with moderately reduced LV systolic function;   trace AI; s/p MVR with trace MR; severe LAE; mild TR.  ------------------------------------------------------------------- Labs, prior tests, procedures, and surgery: Coronary artery bypass grafting (04/23/2017).     Mitral valve replacement with a bovine bioprosthetic valve. Edwards Magna 31 mm.  ------------------------------------------------------------------- Study data:  Comparison was made to the study of 04/26/2017.  Study status:  Routine.  Procedure:  The patient reported no pain pre or post test. Transthoracic echocardiography. Image quality was adequate.  Study completion:  There were no complications. Transthoracic echocardiography.  M-mode, complete 2D, spectral Doppler, and color Doppler.  Birthdate:  Patient birthdate: October 06, 1933.  Age:  Patient is 83 yr old.  Sex:  Gender: female. BMI: 18.8 kg/m^2.  Blood pressure:     106/65  Patient status: Inpatient.  Study date:  Study date: 05/21/2017. Study time: 10:21 AM.  Location:  Bedside.  -------------------------------------------------------------------  ------------------------------------------------------------------- Left ventricle:  The cavity size was normal. Wall thickness was increased in a pattern of mild LVH. Systolic function was moderately reduced. The estimated ejection fraction was in the range of 35% to 40%.  Regional wall motion abnormalities:   There is akinesis of the apical  myocardium.  ------------------------------------------------------------------- Aortic valve:   Trileaflet; mildly calcified leaflets. Mobility was not restricted.  Doppler:  Transvalvular velocity was within the normal range. There was no stenosis. There was trivial regurgitation.  ------------------------------------------------------------------- Aorta:  Aortic root: The aortic root was normal in size.  ------------------------------------------------------------------- Mitral valve:  A bioprosthesis was present. Mobility was not restricted.  Doppler:  Transvalvular velocity was within the normal range. There was no evidence for stenosis. There was trivial regurgitation.    Indexed valve area by continuity equation (using LVOT flow): 0.48 cm^2/m^2.    Mean gradient (D): 2 mm Hg. Peak gradient (D): 3 mm Hg.  ------------------------------------------------------------------- Left atrium:  The atrium was severely dilated.  ------------------------------------------------------------------- Right ventricle:  The cavity size was normal. Systolic function was normal.  ------------------------------------------------------------------- Pulmonic valve:    Doppler:  Transvalvular velocity was within the normal range. There was no evidence for stenosis. There was trivial regurgitation.  ------------------------------------------------------------------- Tricuspid valve:   Structurally normal valve.    Doppler: Transvalvular velocity was within the normal range. There was mild regurgitation.  ------------------------------------------------------------------- Pulmonary artery:   Systolic pressure was within the normal range.   ------------------------------------------------------------------- Right atrium:  The atrium was normal in size.  ------------------------------------------------------------------- Pericardium:  There was no pericardial  effusion.  ------------------------------------------------------------------- Measurements   Left ventricle                            Value          Reference  LV ID, ED, PLAX chordal                   44    mm       43 - 52  LV ID, ES, PLAX chordal                   36.4  mm       23 - 38  LV fx shortening, PLAX chordal    (L)     17    %        >=29  LV PW thickness, ED                       10.4  mm       ---------  IVS/LV PW ratio, ED                       1.16           <=1.3  Stroke volume, 2D                         29    ml       ---------  Stroke volume/bsa, 2D  18    ml/m^2   ---------  LV ejection fraction, 1-p A4C             44    %        ---------  LV end-diastolic volume, 2-p              102   ml       ---------  LV end-systolic volume, 2-p               57    ml       ---------  LV ejection fraction, 2-p                 44    %        ---------  Stroke volume, 2-p                        45    ml       ---------  LV end-diastolic volume/bsa, 2-p          64    ml/m^2   ---------  LV end-systolic volume/bsa, 2-p           36    ml/m^2   ---------  Stroke volume/bsa, 2-p                    28.3  ml/m^2   ---------    Ventricular septum                        Value          Reference  IVS thickness, ED                         12.1  mm       ---------    LVOT                                      Value          Reference  LVOT ID, S                                13    mm       ---------  LVOT area                                 1.33  cm^2     ---------  LVOT peak velocity, S                     112   cm/s     ---------  LVOT mean velocity, S                     81    cm/s     ---------  LVOT VTI, S                               21.7  cm       ---------  LVOT peak gradient, S  5     mm Hg    ---------    Aorta                                     Value          Reference  Aortic root ID, ED                        26    mm        ---------    Left atrium                               Value          Reference  LA ID, A-P, ES                            54    mm       ---------  LA ID/bsa, A-P                    (H)     3.38  cm/m^2   <=2.2  LA volume, ES, 1-p A4C                    118   ml       ---------  LA volume/bsa, ES, 1-p A4C                74    ml/m^2   ---------    Mitral valve                              Value          Reference  Mitral E-wave peak velocity               93.1  cm/s     ---------  Mitral A-wave peak velocity               73.2  cm/s     ---------  Mitral mean velocity, D                   65.5  cm/s     ---------  Mitral deceleration time                  218   ms       150 - 230  Mitral mean gradient, D                   2     mm Hg    ---------  Mitral peak gradient, D                   3     mm Hg    ---------  Mitral E/A ratio, peak                    1.3            ---------  Mitral valve area/bsa, LVOT               0.48  cm^2/m^2 ---------  continuity  Mitral annulus VTI, D  38    cm       ---------    Pulmonary arteries                        Value          Reference  PA pressure, S, DP                (H)     34    mm Hg    <=30    Tricuspid valve                           Value          Reference  Tricuspid regurg peak velocity            253   cm/s     ---------  Tricuspid peak RV-RA gradient             26    mm Hg    ---------    Systemic veins                            Value          Reference  Estimated CVP                             8     mm Hg    ---------    Right ventricle                           Value          Reference  TAPSE                                     12.6  mm       ---------  RV pressure, S, DP                (H)     34    mm Hg    <=30  Legend: (L)  and  (H)  mark values outside specified reference range.  ------------------------------------------------------------------- Prepared and Electronically Authenticated  by  Kirk Ruths 2018-09-11T12:06:31    CHEST  2 VIEW  COMPARISON:  05/18/2017  FINDINGS: Linear and bandlike densities in the right hilum are compatible with scarring and atelectasis. No evidence for pulmonary edema. Negative for pneumothorax. Heart size is with normal limits with post cardiac surgery changes, including mitral valve replacement and left atrial appendage clipping. Again noted are screws and rods in the thoracic and lumbar spine. No large pleural effusions.  IMPRESSION: Persistent densities in the right hilum compatible with areas of scarring and atelectasis. No pulmonary edema.  Post cardiac surgery changes.   Electronically Signed   By: Markus Daft M.D.   On: 06/10/2017 12:07   Impression:  Patient is stable and gradually improving now approximately 6 weeks status post mitral valve replacement using a bioprosthetic tissue valve, coronary artery bypass grafting, and clipping of the left atrial appendage.  She appears euvolemic on exam. Follow-up echocardiogram performed last month reveals improved left ventricular ejection fraction with normal functioning bioprosthetic tissue valve in the mitral position.  Plan:  We have not recommended any changes to the patient's  current medications. I've encouraged the patient to continue to gradually increase her physical activity as tolerated I think she would benefit from participating in the outpatient cardiac rehabilitation program.  She will continue to follow up regularly with Dr. Harrington Challenger and Dr. Haroldine Laws. She will return to our office for routine follow-up in approximately 2 months. All of her questions been addressed.    Valentina Gu. Roxy Manns, MD 06/10/2017 12:22 PM

## 2017-06-10 NOTE — Patient Instructions (Signed)
Hold the trimethoprim while take the Keflex Follow up immediately for any vomiting, lethargy, confusion, or any fever.

## 2017-06-10 NOTE — Progress Notes (Signed)
Subjective:     Patient ID: [name], female   DOB: 08-08-1934, 83 y.o.   MRN: [id number]  HPI Patient seen with one-day history of increased urine frequency and burning with urination. History of frequent UTIs in the past. She has multiple chronic problems including history of CAD, chronic systolic heart failure, hypertension, atrial fibrillation, type 2 diabetes, familial tremor, chronic insomnia, recurrent depression. She had recent mitral valve replacement plus CABG 1. recently seen by cardiology and cardiothoracic surgery. Recent BNP level 2,672 and patient was just started on Lasix.  She denies any fever. No nausea or vomiting. Urine cultures have grown out multiple pathogens in the past including Klebsiella and enterococcus most recently. Most recent hospitalization was 9/8 through 9/12. Patient has been on prophylaxis with trimethoprim 100 mg daily which has seemed to decrease her frequency of UTI.  Past Medical History:  Diagnosis Date  . Allergy   . Anxiety   . Arthritis   . Atrial enlargement, left   . CHF (congestive heart failure) ([place])   . Coronary artery disease   . Depression   . Diabetes mellitus without complication (HCC)    fasting cbg 110s  . Dyspnea   . GERD (gastroesophageal reflux disease)   . H/O hiatal hernia   . Headache(784.0)   . Heart murmur    06/10/2014 seeing new cardiologist  . Heart murmur   . HOH (hard of hearing)    HOH in left ear; needs to speak to patient in right ear  . Hyperlipidemia   . Hypertension   . Mitral regurgitation   . Mitral valve prolapse   . Palpitations    afib on 03/04/17 EKG  . PONV (postoperative nausea and vomiting)    Patient stated "I like the patch behind my ear"  . 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 S/P mitral valve replacement with bioprosthetic valve 04/23/2017   [facility] Mitral bovine pericardial tissue valve: Model 7300TFX, Serial [id number], size 31  . UTI (lower urinary  tract infection)    Past Surgical History:  Procedure Laterality Date  . ABDOMINAL HYSTERECTOMY    . Anterior posterior and enterocele repairs  09/21/2004   With uterosacral cardinal colposuspension, partial colpocleisis; Selinda Orion, MD  . BACK SURGERY    . BREAST ENHANCEMENT SURGERY  02/25/2002   Bilateral reduction and excision of accessory breast tissue underneath the left breast; Aretha Parrot., MD  . BREAST SURGERY  11   reduction  . CARDIAC CATHETERIZATION     no PCI  . CARDIAC CATHETERIZATION  05/02/2017  . CATARACT EXTRACTION Bilateral   . CLIPPING OF ATRIAL APPENDAGE  04/23/2017   Procedure: CLIPPING OF LEFT ATRIAL APPENDAGE;  Surgeon: [name], MD;  Location: [place];  Service: Open Heart Surgery;;  . COLONOSCOPY    . CORONARY ARTERY BYPASS GRAFT N/A 04/23/2017   Procedure: CORONARY ARTERY BYPASS GRAFTING (CABG), ON PUMP, TIMES ONE, USING ENDOSCOPICALLY HARVESTED RIGHT GREATER SAPHENOUS VEIN;  Surgeon: [name], MD;  Location: [place];  Service: Open Heart Surgery;  Laterality: N/A;  . EYE SURGERY Bilateral    cataract removal  . LUMBAR LAMINECTOMY/ DECOMPRESSION WITH MET-RX N/A 03/16/2014   Procedure: LUMBAR FIVE-SACRAL ONE EXTRAFORAMINAL DISKECTOMY WITH METREX;  Surgeon: Kristeen Miss, MD;  Location: [place] NEURO ORS;  Service: Neurosurgery;  Laterality: N/A;  . LUMBAR LAMINECTOMY/DECOMPRESSION MICRODISCECTOMY Left 06/08/2014   Procedure: Left Lumbar Five-Sacral One Microdiskectomy;  Surgeon: Kristeen Miss, MD;  Location: [facility] NEURO ORS;  Service: Neurosurgery;  Laterality: Left;  Left L5-S1 Microdiskectomy  . MITRAL VALVE REPLACEMENT N/A 04/23/2017   Procedure: MITRAL VALVE (MV) REPLACEMENT;  Surgeon: [name], MD;  Location: [place];  Service: Open Heart Surgery;  Laterality: N/A;  . PELVIC FLOOR REPAIR    . POSTERIOR LUMBAR FUSION 4 LEVEL N/A 08/08/2015   Procedure: T8-L1 posterior lateral fusion with decompression T12-L1;  Surgeon: Kristeen Miss, MD;   Location: [place] NEURO ORS;  Service: Neurosurgery;  Laterality: N/A;  T8-L1 posterior lateral fusion with decompression T12-L1  . RIGHT/LEFT HEART CATH AND CORONARY ANGIOGRAPHY N/A 03/28/2017   Procedure: Right/Left Heart Cath and Coronary Angiography;  Surgeon: Nelva Bush, MD;  Location: [place] CV LAB;  Service: Cardiovascular;  Laterality: N/A;  . ROTATOR CUFF REPAIR Bilateral    11.12  . TEE WITHOUT CARDIOVERSION N/A 03/04/2017   Procedure: TRANSESOPHAGEAL ECHOCARDIOGRAM (TEE);  Surgeon: Fay Records, MD;  Location: [place];  Service: Cardiovascular;  Laterality: N/A;  . TEE WITHOUT CARDIOVERSION N/A 04/23/2017   Procedure: TRANSESOPHAGEAL ECHOCARDIOGRAM (TEE);  Surgeon: [name], MD;  Location: [place];  Service: Open Heart Surgery;  Laterality: N/A;  . TONSILLECTOMY AND ADENOIDECTOMY      reports that she has never smoked. She has never used smokeless tobacco. She reports that she does not drink alcohol or use drugs. family history includes Arthritis in her father and mother; Heart disease in her father; Heart failure in her father; Hyperlipidemia in her father and mother; Hypertension in her mother. Allergies  Allergen Reactions  . Cortisone Other (See Comments)    Insomnia, heart palpitations (po only)  . Statins Other (See Comments)    Myalgias   . Codeine Swelling    FACIAL SWELLING SEVERITY UNKNOWN  . Sulfonamide Derivatives     UNSPECIFIED REACTION   . Amiodarone Other (See Comments)    Tremors with 400 mg BID   . Lactose Intolerance (Gi) Nausea And Vomiting     Review of Systems  Constitutional: Negative for chills and fever.  Gastrointestinal: Negative for abdominal pain, nausea and vomiting.  Genitourinary: Positive for dysuria and frequency. Negative for flank pain and hematuria.       Objective:   Physical Exam  Constitutional: She appears well-developed and well-nourished.  Cardiovascular: Normal rate.   Pulmonary/Chest: Effort normal.   Patient has somewhat diminished breath sounds right base compared to left.       Assessment:     Dysuria. Suspect recurrent UTI. Urine dipstick reveals large leukocytes, positive blood, and positive nitrites. She is not have any flank pain, fever, or other signs and symptoms of complicated UTI.  She is higher risk with her multiple comorbidities and also recent hospitalizations. She is allergic to sulfa    Plan:     -Urine culture sent -Start Keflex 500 mg 3 times a day for 7 days pending culture results -Follow-up immediately for any vomiting, fever, flank pain, or other concerns  Eulas Post MD [facility]

## 2017-06-10 NOTE — Telephone Encounter (Signed)
-----   Message from Liliane Shi, [place] sent at 06/09/2017  8:34 PM EDT ----- Please call the patient The Kidney function is normal. Albumin is slightly low. The LFTs are normal. The BNP is high. PLAN:  1. Start Lasix 40 mg QD x 3 days, then reduce to 20 mg QD 2. Start K+ 20 mEq QD x 3 days, then reduce to 10 mEq QD 3. BMET, BNP 1 week. 4. Try to increase protein in diet 5. I will forward labs to Dr. Dorris Carnes for her information as well.  Richardson Dopp, PA-C    06/09/2017 8:33 PM

## 2017-06-10 NOTE — Telephone Encounter (Signed)
[employer] PAF approved for [employer] 24-26 mg BID through 09/09/17.   [name]. Velva Harman, PharmD, BCPS, CPP Clinical Pharmacist Pager: [phone] Phone: [phone] 06/10/2017 11:51 AM

## 2017-06-10 NOTE — Patient Instructions (Signed)
Continue all previous medications without any changes at this time  Continue to avoid any heavy lifting or strenuous use of your arms or shoulders for at least a total of three months from the time of surgery.  After three months you may gradually increase how much you lift or otherwise use your arms or chest as tolerated, with limits based upon whether or not activities lead to the return of significant discomfort.  You are encouraged to enroll and participate in the outpatient cardiac rehab program beginning as soon as practical.  

## 2017-06-10 NOTE — Telephone Encounter (Signed)
Both pt and her husband are aware of lab results and findings. Advised of recommendation per Richardson Dopp, PA to start Lasix with directions as follows: start Lasix 40 mg daily for 3 days then decrease to 20 mg daily. Start potassium 20 meq daily for 3 days then decrease to 10 meq daily, Rx has been sent in. BMET, Pro BNP to be done 10/10 when she see's [place]. Advised to increase protein in her diet, gave suggestions such as beans, beef, yogurts, as well as protein shakes. Both pt and her husband state pt is seeing Dr. Elease Hashimoto today for UTI and are concerned if he puts her on an antibiotic will it interfere with the lasix and K+ or vice versa. I advised they should be fine with what Dr. Elease Hashimoto orders. Both the pt and her husband thanked me for my call today.

## 2017-06-11 ENCOUNTER — Telehealth: Payer: Self-pay | Admitting: *Deleted

## 2017-06-11 ENCOUNTER — Other Ambulatory Visit: Payer: Self-pay | Admitting: *Deleted

## 2017-06-11 ENCOUNTER — Telehealth: Payer: Self-pay | Admitting: Physician Assistant

## 2017-06-11 ENCOUNTER — Telehealth: Payer: Self-pay | Admitting: Family Medicine

## 2017-06-11 DIAGNOSIS — I5022 Chronic systolic (congestive) heart failure: Secondary | ICD-10-CM

## 2017-06-11 MED ORDER — FUROSEMIDE 20 MG PO TABS: ORAL_TABLET | ORAL | 0 refills | Status: DC

## 2017-06-11 MED ORDER — POTASSIUM CHLORIDE ER 10 MEQ PO TBCR: EXTENDED_RELEASE_TABLET | ORAL | 0 refills | Status: DC

## 2017-06-11 MED ORDER — LEVOFLOXACIN 500 MG PO TABS: 500.0000 mg | ORAL_TABLET | Freq: Every day | ORAL | 0 refills | Status: DC

## 2017-06-11 NOTE — Telephone Encounter (Signed)
New message   Patient states prescriptions should have been sent 10/1 but her pharmacy did not have.   *STAT* If patient is at the pharmacy, call can be transferred to refill team.   1. Which medications need to be refilled? (please list name of each medication and dose if known) furosemide (LASIX) 20 MG tablet and Lasix 40mg  2. Which pharmacy/location (including street and city if local pharmacy) is medication to be sent to?  [facility] [address]  3. Do they need a 30 day or 90 day supply? [place]

## 2017-06-11 NOTE — Telephone Encounter (Signed)
Patient Name: [name]  DOB: 07/05/1934    Initial Comment Caller states, having pain from UTI- taking rx. - frquency, every 10 mins.    Nurse Assessment  Nurse: Raphael Gibney, RN, Vanita Ingles Date/Time Eilene Ghazi Time): 06/11/2017 12:38:25 PM  Confirm and document reason for call. If symptomatic, describe symptoms. ---Caller states she has taking medication that the doctor prescribed for her UTI. Started medication yesterday. She is taking Cephalexin. She was awake all night due to the burning. She is urinating every 10 min.  Does the patient have any new or worsening symptoms? ---Yes  Will a triage be completed? ---Yes  Related visit to physician within the last 2 weeks? ---Yes  Does the PT have any chronic conditions? (i.e. diabetes, asthma, etc.) ---Yes  List chronic conditions. ---CABG  Is this a behavioral health or substance abuse call? ---No     Guidelines    Guideline Title Affirmed Question Affirmed Notes  Urinary Tract Infection on Antibiotic Follow-up Call - Female [1] SEVERE pain (e.g., excruciating) AND [2] no improvement 2 hours after pain medications    Final Disposition User   See Physician within 4 Hours (or PCP triage) Raphael Gibney, RN, Vera    Comments  pt does not want to come back to the office for another appt. She wants another antibiotic called in. Something she has been on before for UTI. Please call pt back regarding medication.   Referrals  GO TO FACILITY REFUSED   Caller Disagree/Comply Disagree  Caller Understands Yes  PreDisposition Call Doctor

## 2017-06-11 NOTE — Telephone Encounter (Signed)
See previous phone notes, patient called to check the status if something can be called in for her UTI. Please advise

## 2017-06-11 NOTE — Telephone Encounter (Signed)
Follow up     Pt husband is calling to follow up on this. He states that [facility] is saying they did not get prescriptions.    *STAT* If patient is at the pharmacy, call can be transferred to refill team.   1. Which medications need to be refilled? (please list name of each medication and dose if known) lasix and potassium   2. Which pharmacy/location (including street and city if local pharmacy) is medication to be sent to? [facility] on [address]   3. Do they need a 30 day or 90 day supply? 30 day

## 2017-06-11 NOTE — Telephone Encounter (Signed)
Let's change her antibiotic to Levaquin 500 mg po qd for 7 days.  Her culture is still pending.

## 2017-06-11 NOTE — Telephone Encounter (Signed)
Rx sent and patient is aware. 

## 2017-06-11 NOTE — Telephone Encounter (Signed)
[facility] did not receive them because they were sent to patients mail order pharmacy. I have sent a thirty day rx for both of these [facility] as [facility].

## 2017-06-13 LAB — URINE CULTURE
MICRO NUMBER: 81085732
SPECIMEN QUALITY: ADEQUATE

## 2017-06-13 NOTE — Telephone Encounter (Signed)
Patient called stating the antibiotic that was sent in for her she cannot take due to the "arthodone".  Please contact patient with something else she can take

## 2017-06-14 ENCOUNTER — Ambulatory Visit (INDEPENDENT_AMBULATORY_CARE_PROVIDER_SITE_OTHER): Payer: Medicare Other

## 2017-06-14 DIAGNOSIS — I059 Rheumatic mitral valve disease, unspecified: Secondary | ICD-10-CM

## 2017-06-14 DIAGNOSIS — I481 Persistent atrial fibrillation: Secondary | ICD-10-CM | POA: Diagnosis not present

## 2017-06-14 DIAGNOSIS — I4891 Unspecified atrial fibrillation: Secondary | ICD-10-CM | POA: Diagnosis not present

## 2017-06-14 DIAGNOSIS — Z7901 Long term (current) use of anticoagulants: Secondary | ICD-10-CM

## 2017-06-14 DIAGNOSIS — Z953 Presence of xenogenic heart valve: Secondary | ICD-10-CM | POA: Diagnosis not present

## 2017-06-14 DIAGNOSIS — I4819 Other persistent atrial fibrillation: Secondary | ICD-10-CM

## 2017-06-14 LAB — POCT INR: INR: 1.6

## 2017-06-14 MED ORDER — AMOXICILLIN-POT CLAVULANATE 875-125 MG PO TABS: 1.0000 | ORAL_TABLET | Freq: Two times a day (BID) | ORAL | 0 refills | Status: DC

## 2017-06-14 NOTE — Telephone Encounter (Signed)
Patient return Rachel's call. Please call patient

## 2017-06-14 NOTE — Telephone Encounter (Signed)
Left message on machine for patient to return our call 

## 2017-06-14 NOTE — Telephone Encounter (Signed)
I have no idea what is meant by "arthodone"  Please clarify.  If she cannot take the Levaquin, take off med list and may start Augmentin 875 mg po bid for 7 days (confirm no PCN allergy).

## 2017-06-14 NOTE — Telephone Encounter (Signed)
Spoke with patient.  She has no allergies to penicillin.  She did not take the Levaquin due to med interaction.  New Rx sent.

## 2017-06-18 ENCOUNTER — Telehealth (HOSPITAL_COMMUNITY): Payer: Self-pay | Admitting: Pharmacist

## 2017-06-18 NOTE — Telephone Encounter (Addendum)
Received a message from Mr. Mcconnon stating that he has not heard from [employer] about Mrs. Schaben's Entresto. I have advised him to call [employer] at [phone].   ADDENDUM:  Pharmacy needed to verify the spelling of Mrs. Verhagen's first name which I have given them. They will now be able to ship the Rx to patient. Called Mrs. Aument and relayed this info to her.   [name]. Velva Harman, PharmD, BCPS, CPP Clinical Pharmacist Pager: [phone] Phone: [phone] 06/18/2017 9:13 AM

## 2017-06-18 NOTE — Progress Notes (Signed)
Cardiology Office Note:    Date:  06/19/2017   ID:  [name], DOB 16-Aug-1934, MRN [id number]  PCP:  Eulas Post, MD  Cardiologist:  Dr. Dorris Carnes  Referring MD: Eulas Post, MD   Chief Complaint  Patient presents with  . Follow-up    junctional rhythm    History of Present Illness:    [name] is a 83 y.o. female with a past medical history significant for CAD and Mitral valve prolapse with mitral regurgitation, S/P 1V CABG (SVG-RCA) and MVR with bioprosthetic valve and left atrial appendage clipping on 04/23/17. Her postoperative course was notable for systolic heart failure secondary to worsened LV function with an EF 25-30%. She was managed by the heart failure team and required inotropic support with milrinone. She developed atrial fibrillation and was placed on amiodarone for rhythm control and Coumadin for stroke risk reduction. At her follow-up in the heart failure clinic on 05/15/17 she was again noted to be in atrial fibrillation and her amiodarone dose was increased. She was admitted to the hospital again 9/8-9/12 with left upper quadrant pressure, fatigue, nausea, AKI and atrial fibrillation with RVR. She was thought to have low output symptoms and was placed back on milrinone. She converted back to sinus rhythm on IV amiodarone and transitioned back to oral dosing. Follow-up echo on 05/21/17 demonstrated improved LV function with EF 35-40%. MVR was functioning appropriately.  On 05/28/17 the patient was seen in follow-up by Richardson Dopp, PA and noted to be in junctional rhythm. Her amiodarone was decreased to 200 mg twice a day and later to once a day. At her last appointment on 06/07/17 the patient continued to be in a junctional rhythm and was pretty symptomatic. Her amiodarone was stopped altogether. Her volume appeared to be stable at that time.  She was seen by Dr. Roxy Manns on 06/10/17 at which time she was noted to be making slow progress and no changes were  made in her therapy.  ProBNP on 06/07/17 was 2672. The patient was instructed to take Lasix 40 mg daily for 3 days, then reduce to 20 mg daily. Potassium was also ordered. Chest x-ray on 06/10/17 showed no pulmonary edema.  [name] is here today with her husband for close follow-up. Her EKG shows continued junctional rhythm at 66 bpm. The patient is feeling mildly better than at her last appointment. She denies chest pain, shortness of breath, orthopnea, PND, lightheadedness, edema. She is making very slow improvement since her surgery. She has been walking back and forth on her front porch for exercise. Yesterday she felt well enough to walk up to her street which included walking to the end of her driveway which is up hill. She pushed her wheelchair for support. She was quite fatigued by the time she got to the end of the driveway and had to return.   She notes that her long time usual weight is 120 pounds and she decreased to near 116 pounds with the Lasix and would like to stop taking it now. She denies any heart failure type symptoms.  Her amiodarone is still on hold. She is currently finishing a round of Augmentin for UTI.  Past Medical History:  Diagnosis Date  . Allergy   . Anxiety   . Arthritis   . Atrial enlargement, left   . CHF (congestive heart failure) ([place])   . Coronary artery disease   . Depression   . Diabetes mellitus without complication ([place])  fasting cbg 110s  . Dyspnea   . GERD (gastroesophageal reflux disease)   . H/O hiatal hernia   . Headache(784.0)   . Heart murmur    06/10/2014 seeing new cardiologist  . Heart murmur   . HOH (hard of hearing)    HOH in left ear; needs to speak to patient in right ear  . Hyperlipidemia   . Hypertension   . Mitral regurgitation   . Mitral valve prolapse   . Palpitations    afib on 03/04/17 EKG  . PONV (postoperative nausea and vomiting)    Patient stated "I like the patch behind my ear"  . 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 S/P  CABG x 1 04/23/2017   SVG to distal RCA  . S/P mitral valve replacement with bioprosthetic valve 04/23/2017   [facility] Mitral bovine pericardial tissue valve: Model 7300TFX, Serial [id number], size 31  . UTI (lower urinary tract infection)     Past Surgical History:  Procedure Laterality Date  . ABDOMINAL HYSTERECTOMY    . Anterior posterior and enterocele repairs  09/21/2004   With uterosacral cardinal colposuspension, partial colpocleisis; Selinda Orion, MD  . BACK SURGERY    . BREAST ENHANCEMENT SURGERY  02/25/2002   Bilateral reduction and excision of accessory breast tissue underneath the left breast; Aretha Parrot., MD  . BREAST SURGERY  11   reduction  . CARDIAC CATHETERIZATION     no PCI  . CARDIAC CATHETERIZATION  05/02/2017  . CATARACT EXTRACTION Bilateral   . CLIPPING OF ATRIAL APPENDAGE  04/23/2017   Procedure: CLIPPING OF LEFT ATRIAL APPENDAGE;  Surgeon: [name], MD;  Location: [place];  Service: Open Heart Surgery;;  . COLONOSCOPY    . CORONARY ARTERY BYPASS GRAFT N/A 04/23/2017   Procedure: CORONARY ARTERY BYPASS GRAFTING (CABG), ON PUMP, TIMES ONE, USING ENDOSCOPICALLY HARVESTED RIGHT GREATER SAPHENOUS VEIN;  Surgeon: [name], MD;  Location: [place];  Service: Open Heart Surgery;  Laterality: N/A;  . EYE SURGERY Bilateral    cataract removal  . LUMBAR LAMINECTOMY/ DECOMPRESSION WITH MET-RX N/A 03/16/2014   Procedure: LUMBAR FIVE-SACRAL ONE EXTRAFORAMINAL DISKECTOMY WITH METREX;  Surgeon: Kristeen Miss, MD;  Location: [place] [facility];  Service: Neurosurgery;  Laterality: N/A;  . LUMBAR LAMINECTOMY/DECOMPRESSION MICRODISCECTOMY Left 06/08/2014   Procedure: Left Lumbar Five-Sacral One Microdiskectomy;  Surgeon: Kristeen Miss, MD;  Location: [place] [facility];  Service: Neurosurgery;  Laterality: Left;  Left L5-S1 Microdiskectomy  . MITRAL VALVE REPLACEMENT N/A 04/23/2017   Procedure: MITRAL VALVE (MV) REPLACEMENT;  Surgeon: [name], MD;  Location: [place];   Service: Open Heart Surgery;  Laterality: N/A;  . PELVIC FLOOR REPAIR    . POSTERIOR LUMBAR FUSION 4 LEVEL N/A 08/08/2015   Procedure: T8-L1 posterior lateral fusion with decompression T12-L1;  Surgeon: Kristeen Miss, MD;  Location: [place] [facility];  Service: Neurosurgery;  Laterality: N/A;  T8-L1 posterior lateral fusion with decompression T12-L1  . RIGHT/LEFT HEART CATH AND CORONARY ANGIOGRAPHY N/A 03/28/2017   Procedure: Right/Left Heart Cath and Coronary Angiography;  Surgeon: Nelva Bush, MD;  Location: [place] [facility];  Service: Cardiovascular;  Laterality: N/A;  . ROTATOR CUFF REPAIR Bilateral    11.12  . TEE WITHOUT CARDIOVERSION N/A 03/04/2017   Procedure: TRANSESOPHAGEAL ECHOCARDIOGRAM (TEE);  Surgeon: Fay Records, MD;  Location: [place];  Service: Cardiovascular;  Laterality: N/A;  . TEE WITHOUT CARDIOVERSION N/A 04/23/2017   Procedure: TRANSESOPHAGEAL ECHOCARDIOGRAM (TEE);  Surgeon: [name], MD;  Location[facility];  Service: Open Heart Surgery;  Laterality: N/A;  . TONSILLECTOMY AND ADENOIDECTOMY      Current Medications: Current Meds  Medication Sig  . aspirin EC 81 MG EC tablet Take 1 tablet (81 mg total) by mouth daily.  . bisacodyl (DULCOLAX) 5 MG EC tablet Take 10 mg by mouth daily as needed (for constipation.).  [name] cephALEXin (KEFLEX) 500 MG capsule Take 1 capsule (500 mg total) by mouth 3 (three) times daily.  . citalopram (CELEXA) 20 MG tablet Take 1 tablet (20 mg total) by mouth daily.  . clonazePAM (KLONOPIN) 0.5 MG tablet Take one tablet by mouth PRN insomnia or anxiety.  . conjugated estrogens (PREMARIN) vaginal cream Place 1 Applicatorful vaginally daily.  [name] esomeprazole (NEXIUM) 20 MG capsule Take 20 mg by mouth daily at 12 noon.  . famotidine (PEPCID) 20 MG tablet Take 20 mg by mouth daily as needed for heartburn or indigestion.  . fexofenadine (ALLEGRA) 180 MG tablet Take 90 mg by mouth daily as needed for allergies or rhinitis (allergy headache).    . fluticasone (FLONASE) 50 MCG/ACT nasal spray SHAKE LIQUID AND USE 2 SPRAYS IN EACH NOSTRIL DAILY AS NEEDED FOR ALLERGIES  . HYDROcodone-acetaminophen (NORCO/VICODIN) 5-325 MG tablet Take 0.5 tablets by mouth every 6 (six) hours as needed for severe pain.  [name] levofloxacin (LEVAQUIN) 500 MG tablet Take 1 tablet (500 mg total) by mouth daily.  . magnesium oxide (MAG-OX) 400 (241.3 Mg) MG tablet Take 1 tablet (400 mg total) by mouth 2 (two) times daily.  . metFORMIN (GLUCOPHAGE) 500 MG tablet Take 2 tablets (1,000 mg total) by mouth 2 (two) times daily with a meal.  . Multiple Vitamins-Minerals (ICAPS AREDS 2) CAPS Take 1 capsule by mouth 2 (two) times daily.  Glory Rosebush DELICA LANCETS 45G MISC USE TO TEST TWICE DAILY AS DIRECTED  . ONETOUCH VERIO test strip USE TO CHECK BLOOD GLUCOSE TWICE DAILY AS DIRECTED  . sacubitril-valsartan (ENTRESTO) 24-26 MG Take 1 tablet by mouth 2 (two) times daily.  [name] trimethoprim (TRIMPEX) 100 MG tablet TAKE 1 TABLET(100 MG) BY MOUTH DAILY  . warfarin (COUMADIN) 2.5 MG tablet Take as directed by Coumadin Clinic  . [DISCONTINUED] furosemide (LASIX) 20 MG tablet Take 40 mg by mouth daily for 3 days; then go to 20 mg by mouth daily  . [DISCONTINUED] potassium chloride (K-DUR) 10 MEQ tablet Take 20 meq by mouth daily for 3 days then go to 10 meq by mouth daily     Allergies:   Cortisone; Statins; Codeine; Sulfonamide derivatives; Amiodarone; and Lactose intolerance (gi)   Social History   Social History  . Marital status: Married    Spouse name: N/A  . Number of children: N/A  . Years of education: N/A   Occupational History  . Retired Retired   Social History Main Topics  . Smoking status: Never Smoker  . Smokeless tobacco: Never Used  . Alcohol use No  . Drug use: No  . Sexual activity: Not Asked   Other Topics Concern  . None   Social History Narrative   Married   Regular exercise     Family History: The patient's family history includes  Arthritis in her father and mother; Heart disease in her father; Heart failure in her father; Hyperlipidemia in her father and mother; Hypertension in her mother. ROS:   Please see the history of present illness.     All other systems reviewed and are negative.  EKGs/Labs/Other Studies Reviewed:  The following studies were reviewed today:  Echocardiogram 05/21/2017 Study Conclusions - Left ventricle: The cavity size was normal. Wall thickness was   increased in a pattern of mild LVH. Systolic function was   moderately reduced. The estimated ejection fraction was in the   range of 35% to 40%. There is akinesis of the apical myocardium. - Aortic valve: There was trivial regurgitation. - Mitral valve: A bioprosthesis was present. - Left atrium: The atrium was severely dilated. - Pulmonary arteries: PA peak pressure: 34 mm Hg (S).  Impressions: - Apical akinesis with moderately reduced LV systolic function;   trace AI; s/p MVR with trace MR; severe LAE; mild TR.  EKG:  EKG is ordered today.  The ekg ordered today demonstrates Accelerated junctional rhythm at 66 bpm  Recent Labs: 05/18/2017: B Natriuretic Peptide 3,509.1; TSH 1.716 05/28/2017: Hemoglobin 11.6; Magnesium 1.5; Platelets 375 06/07/2017: ALT 9; BUN 15; Creatinine, Ser 0.76; NT-Pro BNP 2,672; Potassium 4.1; Sodium 134   Recent Lipid Panel    Component Value Date/Time   CHOL 163 08/22/2016 0746   TRIG 101 08/22/2016 0746   HDL 65 08/22/2016 0746   CHOLHDL 2.5 08/22/2016 0746   VLDL 20 08/22/2016 0746   LDLCALC 78 08/22/2016 0746   LDLDIRECT 143.6 06/04/2012 0931    Physical Exam:    VS:  BP 130/60   Pulse 66   Ht 5' 5.5" (1.664 m)   Wt 117 lb 12.8 oz (53.4 kg)   SpO2 95%   BMI 19.30 kg/m     Wt Readings from Last 3 Encounters:  06/19/17 117 lb 12.8 oz (53.4 kg)  06/10/17 120 lb (54.4 kg)  05/31/17 121 lb 9.6 oz (55.2 kg)     Physical Exam  Constitutional: She is oriented to person, place, and time. She  appears well-developed. No distress.  Frail, elderly female  HENT:  Head: Normocephalic and atraumatic.  Neck: Normal range of motion. Neck supple. No JVD present.  Cardiovascular: Normal rate, regular rhythm and normal heart sounds.  Exam reveals no gallop and no friction rub.   No murmur heard. Pulmonary/Chest: Effort normal and breath sounds normal. No respiratory distress. She has no wheezes. She has no rales.  Abdominal: Soft. Bowel sounds are normal. She exhibits no distension. There is no tenderness.  Musculoskeletal: Normal range of motion. She exhibits no edema.  Neurological: She is alert and oriented to person, place, and time.  Skin: Skin is warm and dry.  Psychiatric: She has a normal mood and affect. Her behavior is normal. Thought content normal.     ASSESSMENT:    1. Persistent atrial fibrillation ([place])   2. Chronic systolic heart failure ([place])   3. Coronary artery disease involving native coronary artery of native heart without angina pectoris    PLAN:    In order of problems listed above:  1. Paroxysmal atrial fibrillation: She developed junctional heart rhythm on amiodarone for treatment of atrial fibrillation after 1V CABG/MVR with tissue valve/clipping of the atrial appendage 04/23/17. Her amiodarone has been stopped and she is on no other rate reducing agents. Currently she is in accelerated junctional rhythm at 66 bpm. She seems to be tolerating this well with no chest pain, shortness of breath, orthopnea, lightheadedness. I texted the EKG to Dr. Harrington Challenger and we discussed the patient by phone. We will continue to hold amiodarone and follow pt closely. She has an appt with the coumadin clinic on 10/22 and we will have her seen on that day for  follow up of her rhythm. I have discussed alarm symptoms with the patient and her husband, and when the patient should call our office or proceed to the hospital.  Continues on Coumadin for stroke risk reduction, managed by Coumadin  clinic. No unusual bleeding. We'll check INR today since her medications have been changed with discontinuation of amiodarone and is currently on antibiotics. We'll keep Coumadin clinic appointment on 10/22  2. Chronic systolic heart failure: Her EF was 35-40% by echo. ProBNP on 06/07/17 was 2672, although she did not appear very volume overloaded. She was given Lasix 40 mg daily for 3 days and then reduce to 20 mg daily. The patient reports that her long time dry weight has been 120 pounds. She decreased to 116 pounds on the Lasix and has no significant edema, orthopnea and lungs are clear. I think it is okay to stop Lasix and the patient is instructed to take a dose if her daily weight goes up 3 pounds or shows increased edema or shortness of breath. Repeat proBNPnad BMet today. The patient remains on low doseEntresto and blood pressure is stable.  3. Coronary artery disease: s/p 1V CABGon 04/23/17. Patient progressing slowly since surgery. No angina. She does not wish to attend cardiac rehabilitation. She has been walking back and forth on her long front porch. Yesterday she attempted to go walking down to her street and became fatigued. She is advised to continue to be up and moving as much as possible. We reviewed some exercises that she can do in her chair like sit-to-stands. We also discussed eating a heart healthy diet with lots of vegetables and for her to try to include some protein for her healing.   Medication Adjustments/Labs and Tests Ordered: Current medicines are reviewed at length with the patient today.  Concerns regarding medicines are outlined above. Labs and tests ordered and medication changes are outlined in the patient instructions below:  There are no Patient Instructions on file for this visit.   Signed, Daune Perch, NP  06/19/2017 11:04 AM    [place]

## 2017-06-19 ENCOUNTER — Other Ambulatory Visit: Payer: Medicare Other

## 2017-06-19 ENCOUNTER — Encounter: Payer: Self-pay | Admitting: Physician Assistant

## 2017-06-19 ENCOUNTER — Encounter (INDEPENDENT_AMBULATORY_CARE_PROVIDER_SITE_OTHER): Payer: Self-pay

## 2017-06-19 ENCOUNTER — Telehealth: Payer: Self-pay | Admitting: *Deleted

## 2017-06-19 ENCOUNTER — Ambulatory Visit (INDEPENDENT_AMBULATORY_CARE_PROVIDER_SITE_OTHER): Payer: Medicare Other

## 2017-06-19 ENCOUNTER — Ambulatory Visit (INDEPENDENT_AMBULATORY_CARE_PROVIDER_SITE_OTHER): Payer: Medicare Other | Admitting: Cardiology

## 2017-06-19 VITALS — BP 130/60 | HR 66 | Ht 65.5 in | Wt 117.8 lb

## 2017-06-19 DIAGNOSIS — Z7901 Long term (current) use of anticoagulants: Secondary | ICD-10-CM

## 2017-06-19 DIAGNOSIS — I481 Persistent atrial fibrillation: Secondary | ICD-10-CM

## 2017-06-19 DIAGNOSIS — I4891 Unspecified atrial fibrillation: Secondary | ICD-10-CM

## 2017-06-19 DIAGNOSIS — I059 Rheumatic mitral valve disease, unspecified: Secondary | ICD-10-CM

## 2017-06-19 DIAGNOSIS — I4819 Other persistent atrial fibrillation: Secondary | ICD-10-CM

## 2017-06-19 DIAGNOSIS — I2510 Atherosclerotic heart disease of native coronary artery without angina pectoris: Secondary | ICD-10-CM

## 2017-06-19 DIAGNOSIS — I5022 Chronic systolic (congestive) heart failure: Secondary | ICD-10-CM | POA: Diagnosis not present

## 2017-06-19 DIAGNOSIS — Z953 Presence of xenogenic heart valve: Secondary | ICD-10-CM | POA: Diagnosis not present

## 2017-06-19 LAB — BASIC METABOLIC PANEL
BUN / CREAT RATIO: 15 (ref 12–28)
BUN: 15 mg/dL (ref 8–27)
CHLORIDE: 97 mmol/L (ref 96–106)
CO2: 22 mmol/L (ref 20–29)
Calcium: 8.9 mg/dL (ref 8.7–10.3)
Creatinine, Ser: 1 mg/dL (ref 0.57–1.00)
GFR calc Af Amer: 61 mL/min/{1.73_m2} (ref >59)
GFR calc non Af Amer: 53 mL/min/{1.73_m2} — ABNORMAL LOW (ref >59)
GLUCOSE: 143 mg/dL — AB (ref 65–99)
POTASSIUM: 4.3 mmol/L (ref 3.5–5.2)
SODIUM: 135 mmol/L (ref 134–144)

## 2017-06-19 LAB — POCT INR: INR: 1.7

## 2017-06-19 LAB — MAGNESIUM: MAGNESIUM: 1.4 mg/dL — AB (ref 1.6–2.3)

## 2017-06-19 LAB — PROTIME-INR
INR: 1.9 — ABNORMAL HIGH (ref 0.8–1.2)
PROTHROMBIN TIME: 19.2 s — AB (ref 9.1–12.0)

## 2017-06-19 LAB — PRO B NATRIURETIC PEPTIDE: NT-Pro BNP: 489 pg/mL (ref 0–738)

## 2017-06-19 MED ORDER — MAGNESIUM OXIDE -MG SUPPLEMENT 200 MG PO TABS
200.0000 mg | ORAL_TABLET | Freq: Two times a day (BID) | ORAL | 5 refills | Status: DC
Start: 2017-06-19 — End: 2017-09-17

## 2017-06-19 MED ORDER — FUROSEMIDE 20 MG PO TABS: 20.0000 mg | ORAL_TABLET | ORAL | 6 refills | Status: DC | PRN

## 2017-06-19 MED ORDER — POTASSIUM CHLORIDE ER 10 MEQ PO TBCR: 10.0000 meq | EXTENDED_RELEASE_TABLET | ORAL | 6 refills | Status: DC | PRN

## 2017-06-19 NOTE — Telephone Encounter (Signed)
-----   Message from Daune Perch, NP sent at 06/19/2017  5:06 PM EDT ----- Please let pt know that her labs looked pretty good. Her kidney function is stable. The lab that was elevated at her previous appointment, BNP, is now normal indicating that she is no longer fluid overloaded. It is OK to stop the lasix and take as needed as we discussed. Her magnesium level is low and she will need to restart Mag ox. Please send in refills X5.    Thanks, Daune Perch, NP

## 2017-06-19 NOTE — Telephone Encounter (Signed)
Pt has been notified of lab results and findings by phone with verbal understanding. Pt aware Mag level just a little low and has been advised to restart Magnesium. Per Pecolia Ades, NP we will restart Mag Ox 200 mg BID, Rx has been sent in. Pt thanked me for my help today. Pt aware to continue her medications as discussed at [facility] earlier today. Will forward results to Dr. Elease Hashimoto.

## 2017-06-19 NOTE — Patient Instructions (Signed)
Medication Instructions:  1. CHANGE TAKING LASIX 20 MG TO AS NEEDED IF YOUR WEIGHT GOES UP BY 3 POUNDS IN ONE DAY OR  INCREASED SHORTNESS OF BREATH OR INCREASED SWELLING.  2. CHANGE TAKING POTASSIUM 10 MEQ TO AS NEEDED WHEN YOU TAKE LASIX  Labwork: TODAY: BMET, MG2+, PRO BNP, INR  Testing/Procedures: NONE ORDERED  Follow-Up: FOLLOW UP WITH VIN BHAGAT ON October 22,2018 @ 11:00 AM  Any Other Special Instructions Will Be Listed Below (If Applicable).     If you need a refill on your cardiac medications before your next appointment, please call your pharmacy.

## 2017-06-22 ENCOUNTER — Other Ambulatory Visit: Payer: Self-pay | Admitting: Family Medicine

## 2017-06-24 NOTE — Telephone Encounter (Signed)
Her med list says 0.5 mg.  I would like to see her come off this secondary to risk of falls and possible increases risks of memory loss with long acting benzos.  Refill 0.5 mg #20.  Suggest she consider trial of OTC melatonin 2 mg qhs- if she has  Not already.

## 2017-06-24 NOTE — Telephone Encounter (Signed)
Last refill 05/07/17 and last office visit 06/10/17.  Okay to fill?

## 2017-06-26 ENCOUNTER — Ambulatory Visit (INDEPENDENT_AMBULATORY_CARE_PROVIDER_SITE_OTHER): Payer: Medicare Other | Admitting: Family Medicine

## 2017-06-26 ENCOUNTER — Encounter: Payer: Self-pay | Admitting: Family Medicine

## 2017-06-26 VITALS — BP 114/70 | HR 74 | Temp 98.0°F | Wt 118.7 lb

## 2017-06-26 DIAGNOSIS — Z23 Encounter for immunization: Secondary | ICD-10-CM | POA: Diagnosis not present

## 2017-06-26 DIAGNOSIS — R300 Dysuria: Secondary | ICD-10-CM | POA: Diagnosis not present

## 2017-06-26 LAB — POCT URINALYSIS DIPSTICK
Bilirubin, UA: NEGATIVE
Glucose, UA: NEGATIVE
KETONES UA: NEGATIVE
Nitrite, UA: NEGATIVE
PH UA: 6 (ref 5.0–8.0)
PROTEIN UA: NEGATIVE
SPEC GRAV UA: 1.015 (ref 1.010–1.025)
UROBILINOGEN UA: 0.2 U/dL

## 2017-06-26 MED ORDER — CLONAZEPAM 0.5 MG PO TABS: ORAL_TABLET | ORAL | 0 refills | Status: DC

## 2017-06-26 NOTE — Patient Instructions (Signed)
Go ahead and start the Levaquin 500 mg one daily. We will call you with culture results.

## 2017-06-26 NOTE — Progress Notes (Signed)
Subjective:     Patient ID: [name], female   DOB: 27-Aug-1934, 83 y.o.   MRN: [id number]  HPI Patient seen with recurrence of urinary symptoms of frequency and urgency. Only mild burning with urination. She had recent Klebsiella pneumonia UTI. She was treated with Augmentin and did improve symptomatically until couple days ago. She's not had any fevers or chills. No nausea or vomiting. No flank pain. History of frequent UTIs in the past. Was recently taken off amiodarone. Feels better overall.  Past Medical History:  Diagnosis Date  . Allergy   . Anxiety   . Arthritis   . Atrial enlargement, left   . CHF (congestive heart failure) ([place])   . Coronary artery disease   . Depression   . Diabetes mellitus without complication (HCC)    fasting cbg 110s  . Dyspnea   . GERD (gastroesophageal reflux disease)   . H/O hiatal hernia   . Headache(784.0)   . Heart murmur    06/10/2014 seeing new cardiologist  . Heart murmur   . HOH (hard of hearing)    HOH in left ear; needs to speak to patient in right ear  . Hyperlipidemia   . Hypertension   . Mitral regurgitation   . Mitral valve prolapse   . Palpitations    afib on 03/04/17 EKG  . PONV (postoperative nausea and vomiting)    Patient stated "I like the patch behind my ear"  . 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 S/P mitral valve replacement with bioprosthetic valve 04/23/2017   [facility] Mitral bovine pericardial tissue valve: Model 7300TFX, Serial [id number], size 31  . UTI (lower urinary tract infection)    Past Surgical History:  Procedure Laterality Date  . ABDOMINAL HYSTERECTOMY    . Anterior posterior and enterocele repairs  09/21/2004   With uterosacral cardinal colposuspension, partial colpocleisis; Selinda Orion, MD  . BACK SURGERY    . BREAST ENHANCEMENT SURGERY  02/25/2002   Bilateral reduction and excision of accessory breast tissue underneath the left breast; Aretha Parrot.,  MD  . BREAST SURGERY  11   reduction  . CARDIAC CATHETERIZATION     no PCI  . CARDIAC CATHETERIZATION  05/02/2017  . CATARACT EXTRACTION Bilateral   . CLIPPING OF ATRIAL APPENDAGE  04/23/2017   Procedure: CLIPPING OF LEFT ATRIAL APPENDAGE;  Surgeon: [name], MD;  Location: [place];  Service: Open Heart Surgery;;  . COLONOSCOPY    . CORONARY ARTERY BYPASS GRAFT N/A 04/23/2017   Procedure: CORONARY ARTERY BYPASS GRAFTING (CABG), ON PUMP, TIMES ONE, USING ENDOSCOPICALLY HARVESTED RIGHT GREATER SAPHENOUS VEIN;  Surgeon: [name], MD;  Location: [place];  Service: Open Heart Surgery;  Laterality: N/A;  . EYE SURGERY Bilateral    cataract removal  . LUMBAR LAMINECTOMY/ DECOMPRESSION WITH MET-RX N/A 03/16/2014   Procedure: LUMBAR FIVE-SACRAL ONE EXTRAFORAMINAL DISKECTOMY WITH METREX;  Surgeon: Kristeen Miss, MD;  Location: [place] [facility]UR[facility];  Service: Neurosurgery;  Laterality: N/A;  . LUMBAR LAMINECTOMY/DECOMPRESSION MICRODISCECTOMY Left 06/08/2014   Procedure: Left Lumbar Five-Sacral One Microdiskectomy;  Surgeon: Kristeen Miss, MD;  Location: [place] NEURO ORS;  Service: Neurosurgery;  Laterality: Left;  Left L5-S1 Microdiskectomy  . MITRAL VALVE REPLACEMENT N/A 04/23/2017   Procedure: MITRAL VALVE (MV) REPLACEMENT;  Surgeon: [name], MD;  Location: [place];  Service: Open Heart Surgery;  Laterality: N/A;  . PELVIC FLOOR REPAIR    .  POSTERIOR LUMBAR FUSION 4 LEVEL N/A 08/08/2015   Procedure: T8-L1 posterior lateral fusion with decompression T12-L1;  Surgeon: Kristeen Miss, MD;  Location: [place] NEUR[facility];  Service: Neurosurgery;  Laterality: N/A;  T8-L1 posterior lateral fusion with decompression T12-L1  . RIGHT/LEFT HEART CATH AND CORONARY ANGIOGRAPHY N/A 03/28/2017   Procedure: Right/Left Heart Cath and Coronary Angiography;  Surgeon: Nelva Bush, MD;  Location: [place] CV LAB;  Service: Cardiovascular;  Laterality: N/A;  . ROTATOR CUFF REPAIR Bilateral    11.12  . TEE WITHOUT  CARDIOVERSION N/A 03/04/2017   Procedure: TRANSESOPHAGEAL ECHOCARDIOGRAM (TEE);  Surgeon: Fay Records, MD;  Location: [place];  Service: Cardiovascular;  Laterality: N/A;  . TEE WITHOUT CARDIOVERSION N/A 04/23/2017   Procedure: TRANSESOPHAGEAL ECHOCARDIOGRAM (TEE);  Surgeon: [name], MD;  Location: [place];  Service: Open Heart Surgery;  Laterality: N/A;  . TONSILLECTOMY AND ADENOIDECTOMY      reports that she has never smoked. She has never used smokeless tobacco. She reports that she does not drink alcohol or use drugs. family history includes Arthritis in her father and mother; Heart disease in her father; Heart failure in her father; Hyperlipidemia in her father and mother; Hypertension in her mother. Allergies  Allergen Reactions  . Cortisone Other (See Comments)    Insomnia, heart palpitations (po only)  . Statins Other (See Comments)    Myalgias   . Codeine Swelling    FACIAL SWELLING SEVERITY UNKNOWN  . Sulfonamide Derivatives     UNSPECIFIED REACTION   . Amiodarone Other (See Comments)    Tremors with 400 mg BID   . Lactose Intolerance (Gi) Nausea And Vomiting     Review of Systems  Constitutional: Negative for appetite change, chills, fever and unexpected weight change.  Gastrointestinal: Negative for abdominal pain, constipation, diarrhea, nausea and vomiting.  Genitourinary: Positive for dysuria and frequency.  Musculoskeletal: Negative for back pain.  Neurological: Negative for dizziness.       Objective:   Physical Exam  Constitutional: She appears well-developed and well-nourished.  HENT:  Head: Normocephalic and atraumatic.  Neck: Neck supple. No thyromegaly present.  Cardiovascular: Normal rate, regular rhythm and normal heart sounds.   Pulmonary/Chest: Breath sounds normal.  Abdominal: Soft. Bowel sounds are normal. There is no tenderness.       Assessment:     Recurrent dysuria. Recent Klebsiella pneumonia UTI. Urine dipstick today  reveals 2+ leukocytes and 1+ blood with negative nitrite    Plan:     -Obtain repeat urine culture -Stay well-hydrated -Start Levaquin 500 mg once daily pending culture results. This was previously prescribed but she could not take because of the amiodarone. She's now off amiodarone.  Eulas Post MD [facility]

## 2017-06-26 NOTE — Telephone Encounter (Signed)
Patient is aware and Rx called in. 

## 2017-06-28 NOTE — Progress Notes (Signed)
 Cardiology Office Note    Date:  07/01/2017   ID:  [name], DOB 11/09/1933, MRN [id number]  PCP:  Burchette, Bruce W, MD  Cardiologist: Dr. Ross  Chief Complaint:  1 week follow up on junctional rhythm  History of Present Illness:   [name] is a 83 y.o. female with a past medical history significant for CAD and Mitral valve prolapse with mitral regurgitation, S/P 1V CABG (SVG-RCA) and MVR with bioprosthetic valve and left atrial appendage clipping on 04/23/17, chronic systolic CHF, PAF and junctional rhythm presents for follow up.  Her postoperative course 04/2017 during surgery was notable for systolic heart failure secondary to worsened LV function with an EF 25-30%. She was managed by the heart failure team and required inotropic support with milrinone. She developed atrial fibrillation and was placed on amiodarone for rhythm control and Coumadin for stroke risk reduction. At her follow-up in the heart failure clinic on 05/15/17 she was again noted to be in atrial fibrillation and her amiodarone dose was increased. She was admitted to the hospital again 9/8-9/12 with left upper quadrant pressure, fatigue, nausea, AKI and atrial fibrillation with RVR. She was thought to have low output symptoms and was placed back on milrinone. She converted back to sinus rhythm on IV amiodarone and transitioned back to oral dosing. Follow-up echo on 05/21/17 demonstrated improved LV function with EF 35-40%. MVR was functioning appropriately.  She was seen by Dr. Owen on 06/10/17 at which time she was noted to be making slow progress and no changes were made in her therapy.  Recently noted elevated BNP and lasix dose increased. Last seen by APP 06/19/17. EKG showed accelerated junctional rhythm at 66 bpm despite discontinuation of amiodarone. BNP normalized.   Here today for evaluation of rhythm. Only required to take Lasix once. She denies chest pain, lower extremity edema, palpitation, orthopnea,  PND, dizziness, syncope, melena or blood in her stool or urine. She has intermittent dyspnea. Compliant with medication. She wants to gain weight. Weight of 118lb today (gained 1lb since last OV).   Past Medical History:  Diagnosis Date  . Allergy   . Anxiety   . Arthritis   . Atrial enlargement, left   . CHF (congestive heart failure) (HCC)   . Coronary artery disease   . Depression   . Diabetes mellitus without complication (HCC)    fasting cbg 110s  . Dyspnea   . GERD (gastroesophageal reflux disease)   . H/O hiatal hernia   . Headache(784.0)   . Heart murmur    06/10/2014 seeing new cardiologist  . Heart murmur   . HOH (hard of hearing)    HOH in left ear; needs to speak to patient in right ear  . Hyperlipidemia   . Hypertension   . Mitral regurgitation   . Mitral valve prolapse   . Palpitations    afib on 03/04/17 EKG  . PONV (postoperative nausea and vomiting)    Patient stated "I like the patch behind my ear"  . 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 S/P mitral valve replacement with bioprosthetic valve 04/23/2017   [facility] Mitral bovine pericardial tissue valve: Model 7300TFX, Serial [id number], size 31  . UTI (lower urinary tract infection)     Past Surgical History:  Procedure Laterality Date  . ABDOMINAL HYSTERECTOMY    . Anterior posterior and enterocele repairs  09/21/2004   With uterosacral   cardinal colposuspension, partial colpocleisis; Selinda Orion, MD  . BACK SURGERY    . BREAST ENHANCEMENT SURGERY  02/25/2002   Bilateral reduction and excision of accessory breast tissue underneath the left breast; Aretha Parrot., MD  . BREAST SURGERY  11   reduction  . CARDIAC CATHETERIZATION     no PCI  . CARDIAC CATHETERIZATION  05/02/2017  . CATARACT EXTRACTION Bilateral   . CLIPPING OF ATRIAL APPENDAGE  04/23/2017   Procedure: CLIPPING OF LEFT ATRIAL APPENDAGE;  Surgeon: [name], MD;  Location: [place];  Service:  Open Heart Surgery;;  . COLONOSCOPY    . CORONARY ARTERY BYPASS GRAFT N/A 04/23/2017   Procedure: CORONARY ARTERY BYPASS GRAFTING (CABG), ON PUMP, TIMES ONE, USING ENDOSCOPICALLY HARVESTED RIGHT GREATER SAPHENOUS VEIN;  Surgeon: [name], MD;  Location: [place];  Service: Open Heart Surgery;  Laterality: N/A;  . EYE SURGERY Bilateral    cataract removal  . LUMBAR LAMINECTOMY/ DECOMPRESSION WITH MET-RX N/A 03/16/2014   Procedure: LUMBAR FIVE-SACRAL ONE EXTRAFORAMINAL DISKECTOMY WITH METREX;  Surgeon: Kristeen Miss, MD;  Location: [place] NEURO ORS;  Service: Neurosurgery;  Laterality: N/A;  . LUMBAR LAMINECTOMY/DECOMPRESSION MICRODISCECTOMY Left 06/08/2014   Procedure: Left Lumbar Five-Sacral One Microdiskectomy;  Surgeon: Kristeen Miss, MD;  Location: [place] NEURO ORS;  Service: Neurosurgery;  Laterality: Left;  Left L5-S1 Microdiskectomy  . MITRAL VALVE REPLACEMENT N/A 04/23/2017   Procedure: MITRAL VALVE (MV) REPLACEMENT;  Surgeon: [name], MD;  Location: [place];  Service: Open Heart Surgery;  Laterality: N/A;  . PELVIC FLOOR REPAIR    . POSTERIOR LUMBAR FUSION 4 LEVEL N/A 08/08/2015   Procedure: T8-L1 posterior lateral fusion with decompression T12-L1;  Surgeon: Kristeen Miss, MD;  Location: [place] NEURO ORS;  Service: Neurosurgery;  Laterality: N/A;  T8-L1 posterior lateral fusion with decompression T12-L1  . RIGHT/LEFT HEART CATH AND CORONARY ANGIOGRAPHY N/A 03/28/2017   Procedure: Right/Left Heart Cath and Coronary Angiography;  Surgeon: Nelva Bush, MD;  Location: [place] CV LAB;  Service: Cardiovascular;  Laterality: N/A;  . ROTATOR CUFF REPAIR Bilateral    11.12  . TEE WITHOUT CARDIOVERSION N/A 03/04/2017   Procedure: TRANSESOPHAGEAL ECHOCARDIOGRAM (TEE);  Surgeon: Fay Records, MD;  Location: [place];  Service: Cardiovascular;  Laterality: N/A;  . TEE WITHOUT CARDIOVERSION N/A 04/23/2017   Procedure: TRANSESOPHAGEAL ECHOCARDIOGRAM (TEE);  Surgeon: [name], MD;  Location:  [place];  Service: Open Heart Surgery;  Laterality: N/A;  . TONSILLECTOMY AND ADENOIDECTOMY      Current Medications: Prior to Admission medications   Medication Sig Start Date End Date Taking? Authorizing Provider  aspirin EC 81 MG EC tablet Take 1 tablet (81 mg total) by mouth daily. 05/07/17   Nani Skillern, PA-C  bisacodyl (DULCOLAX) 5 MG EC tablet Take 10 mg by mouth daily as needed (for constipation.).    [provider]  citalopram (CELEXA) 20 MG tablet Take 1 tablet (20 mg total) by mouth daily. 06/10/17   Burchette, Alinda Sierras, MD  clonazePAM (KLONOPIN) 0.5 MG tablet Take one tablet by mouth PRN insomnia or anxiety. 06/26/17   Burchette, Alinda Sierras, MD  conjugated estrogens (PREMARIN) vaginal cream Place 1 Applicatorful vaginally daily. 02/06/17   Burchette, Alinda Sierras, MD  esomeprazole (NEXIUM) 20 MG capsule Take 20 mg by mouth daily at 12 noon.    [provider]  famotidine (PEPCID) 20 MG tablet Take 20 mg by mouth daily as needed for heartburn or indigestion.    [provider]  fexofenadine (ALLEGRA) 180 MG tablet Take 90 mg by mouth daily as needed for allergies or rhinitis (allergy headache).    [provider]  fluticasone (FLONASE) 50 MCG/ACT nasal spray SHAKE LIQUID AND USE 2 SPRAYS IN EACH NOSTRIL DAILY AS NEEDED FOR ALLERGIES 09/14/16   Burchette, Bruce W, MD  furosemide (LASIX) 20 MG tablet Take 1 tablet (20 mg total) by mouth as needed for fluid or edema. 06/19/17 09/17/17  Hammond, Janine, NP  Magnesium Oxide 200 MG TABS Take 1 tablet (200 mg total) by mouth 2 (two) times daily. 06/19/17   Hammond, Janine, NP  metFORMIN (GLUCOPHAGE) 500 MG tablet Take 2 tablets (1,000 mg total) by mouth 2 (two) times daily with a meal. 03/31/17   End, Christopher, MD  Multiple Vitamins-Minerals (ICAPS AREDS 2) CAPS Take 1 capsule by mouth 2 (two) times daily.    [provider]  ONETOUCH DELICA LANCETS 33G MISC USE TO TEST TWICE DAILY AS DIRECTED 11/13/16    Burchette, Bruce W, MD  ONETOUCH VERIO test strip USE TO CHECK BLOOD GLUCOSE TWICE DAILY AS DIRECTED 05/30/16   Burchette, Bruce W, MD  potassium chloride (K-DUR) 10 MEQ tablet Take 1 tablet (10 mEq total) by mouth as needed (WHEN TAKING LASIX). 06/19/17 09/17/17  Hammond, Janine, NP  sacubitril-valsartan (ENTRESTO) 24-26 MG Take 1 tablet by mouth 2 (two) times daily. 05/31/17   Smith, Erin E, NP  trimethoprim (TRIMPEX) 100 MG tablet TAKE 1 TABLET(100 MG) BY MOUTH DAILY 06/03/17   Burchette, Bruce W, MD  warfarin (COUMADIN) 2.5 MG tablet Take as directed by Coumadin Clinic 06/07/17   Ross, Paula V, MD    Allergies:   Cortisone; Statins; Codeine; Sulfonamide derivatives; Amiodarone; and Lactose intolerance (gi)   Social History   Social History  . Marital status: Married    Spouse name: N/A  . Number of children: N/A  . Years of education: N/A   Occupational History  . Retired Retired   Social History Main Topics  . Smoking status: Never Smoker  . Smokeless tobacco: Never Used  . Alcohol use No  . Drug use: No  . Sexual activity: Not Asked   Other Topics Concern  . None   Social History Narrative   Married   Regular exercise     Family History:  The patient's family history includes Arthritis in her father and mother; Heart disease in her father; Heart failure in her father; Hyperlipidemia in her father and mother; Hypertension in her mother.   ROS:   Please see the history of present illness.    ROS All other systems reviewed and are negative.   PHYSICAL EXAM:   VS:  BP 114/62   Pulse 70   Resp 16   Ht 5' 6.5" (1.689 m)   Wt 118 lb (53.5 kg)   SpO2 96%   BMI 18.76 kg/m    GEN: Frail ill appearing female  in no acute distress  HEENT: normal  Neck: no JVD, carotid bruits, or masses Cardiac: RRR; no murmurs, rubs, or gallops,no edema  Respiratory:  clear to auscultation bilaterally, normal work of breathing GI: soft, nontender, nondistended, + BS MS: no deformity or  atrophy  Skin: warm and dry, no rash Neuro:  Alert and Oriented x 3, Strength and sensation are intact Psych: euthymic mood, full affect  Wt Readings from Last 3 Encounters:  07/01/17 118 lb (53.5 kg)  06/26/17 118 lb 11.2 oz (53.8 kg)  06/19/17 117 lb 12.8 oz (53.4   kg)      Studies/Labs Reviewed:   EKG:  EKG is ordered today.  The ekg ordered today demonstrates junctional rhythm at rate of 68 bpm  Recent Labs: 05/18/2017: B Natriuretic Peptide 3,509.1; TSH 1.716 05/28/2017: Hemoglobin 11.6; Platelets 375 06/07/2017: ALT 9 06/19/2017: BUN 15; Creatinine, Ser 1.00; Magnesium 1.4; NT-Pro BNP 489; Potassium 4.3; Sodium 135   Lipid Panel    Component Value Date/Time   CHOL 163 08/22/2016 0746   TRIG 101 08/22/2016 0746   HDL 65 08/22/2016 0746   CHOLHDL 2.5 08/22/2016 0746   VLDL 20 08/22/2016 0746   LDLCALC 78 08/22/2016 0746   LDLDIRECT 143.6 06/04/2012 0931    Additional studies/ records that were reviewed today include:   Echocardiogram: 05/21/17 Study Conclusions  - Left ventricle: The cavity size was normal. Wall thickness was   increased in a pattern of mild LVH. Systolic function was   moderately reduced. The estimated ejection fraction was in the   range of 35% to 40%. There is akinesis of the apical myocardium. - Aortic valve: There was trivial regurgitation. - Mitral valve: A bioprosthesis was present. - Left atrium: The atrium was severely dilated. - Pulmonary arteries: PA peak pressure: 34 mm Hg (S).  Impressions:  - Apical akinesis with moderately reduced LV systolic function;   trace AI; s/p MVR with trace MR; severe LAE; mild TR.  Cardiac Catheterization: 03/28/17 ght/Left Heart Cath and Coronary Angiography  Conclusion   Conclusions: 1. Severe single-vessel coronary artery disease with 60% proximal and 99% distal RCA stenoses as well as chronic total occlusion of the ostial rPDA. The rPDA andr PL branches are supplied by left-to-right  collaterals. 2. 70% proximal D2 stenosis is evident. Otherwise, there is mild, nonobstructive disease involving the LAD, LCx, and their branches. 3. Normal left and right heart filling pressures. Normal pulmonary artery pressure. Normal Fick cardiac output/index.  Recommendations: 1. Aggressive secondary prevention of CAD and medical therapy. 2. Defer decision of continued medical management versus revascularization at the time of mitral valve surgery to Drs. Owen and Ross. 3. Restart rivaroxaban tomorrow if no evidence of bleeding or other vascular complication at site of catheterization.       ASSESSMENT & PLAN:    1. PAF with junctional rhythm - She continue to have junctional rhythm despite discontinuation of amiodarone. No dizziness or syncope. Will review with Dr. Ross for EP evaluation.   2.Chronic systolic CHF - BNP was normal during last office visit. Euvolemic. Continue current medications.   3. CAD s/p CABG x 1 04/23/17 - No chest pain. Recovering slowly. Continue ASA and statin.   4. HTN - BP stable  Medication Adjustments/Labs and Tests Ordered: Current medicines are reviewed at length with the patient today.  Concerns regarding medicines are outlined above.  Medication changes, Labs and Tests ordered today are listed in the Patient Instructions below. Patient Instructions  Medication Instructions:  Your physician recommends that you continue on your current medications as directed. Please refer to the Current Medication list given to you today.  Labwork: None   Testing/Procedures: None   Follow-Up: Your physician recommends that you schedule a follow-up appointment in: 2 months with Dr Ross.  Any Other Special Instructions Will Be Listed Below (If Applicable).  If you need a refill on your cardiac medications before your next appointment, please call your pharmacy.     Signed, Bhagat,Bhavinkumar, PA  07/01/2017 11:31 AM    [facility]  [facility] [address],   [place], [place]  [address] Phone: [phone]; Fax: [phone]  

## 2017-06-29 LAB — URINE CULTURE
MICRO NUMBER:: 81159417
SPECIMEN QUALITY:: ADEQUATE

## 2017-07-01 ENCOUNTER — Ambulatory Visit (INDEPENDENT_AMBULATORY_CARE_PROVIDER_SITE_OTHER): Payer: Medicare Other | Admitting: Pharmacist

## 2017-07-01 ENCOUNTER — Encounter: Payer: Self-pay | Admitting: Physician Assistant

## 2017-07-01 ENCOUNTER — Ambulatory Visit (INDEPENDENT_AMBULATORY_CARE_PROVIDER_SITE_OTHER): Payer: Medicare Other | Admitting: Physician Assistant

## 2017-07-01 ENCOUNTER — Telehealth: Payer: Self-pay | Admitting: Family Medicine

## 2017-07-01 VITALS — BP 114/62 | HR 70 | Resp 16 | Ht 66.5 in | Wt 118.0 lb

## 2017-07-01 DIAGNOSIS — I4891 Unspecified atrial fibrillation: Secondary | ICD-10-CM | POA: Diagnosis not present

## 2017-07-01 DIAGNOSIS — I481 Persistent atrial fibrillation: Secondary | ICD-10-CM

## 2017-07-01 DIAGNOSIS — I5022 Chronic systolic (congestive) heart failure: Secondary | ICD-10-CM | POA: Diagnosis not present

## 2017-07-01 DIAGNOSIS — I059 Rheumatic mitral valve disease, unspecified: Secondary | ICD-10-CM | POA: Diagnosis not present

## 2017-07-01 DIAGNOSIS — I4819 Other persistent atrial fibrillation: Secondary | ICD-10-CM

## 2017-07-01 DIAGNOSIS — I2510 Atherosclerotic heart disease of native coronary artery without angina pectoris: Secondary | ICD-10-CM | POA: Diagnosis not present

## 2017-07-01 DIAGNOSIS — I498 Other specified cardiac arrhythmias: Secondary | ICD-10-CM

## 2017-07-01 DIAGNOSIS — I10 Essential (primary) hypertension: Secondary | ICD-10-CM

## 2017-07-01 DIAGNOSIS — Z953 Presence of xenogenic heart valve: Secondary | ICD-10-CM

## 2017-07-01 DIAGNOSIS — Z7901 Long term (current) use of anticoagulants: Secondary | ICD-10-CM | POA: Diagnosis not present

## 2017-07-01 LAB — POCT INR: INR: 1.5

## 2017-07-01 NOTE — Telephone Encounter (Signed)
Patient is aware of lab result. 

## 2017-07-01 NOTE — Telephone Encounter (Signed)
Pt would like ua result

## 2017-07-01 NOTE — Patient Instructions (Signed)
Medication Instructions:  Your physician recommends that you continue on your current medications as directed. Please refer to the Current Medication list given to you today.  Labwork: None   Testing/Procedures: None   Follow-Up: Your physician recommends that you schedule a follow-up appointment in: 2 months with Dr Harrington Challenger.  Any Other Special Instructions Will Be Listed Below (If Applicable).  If you need a refill on your cardiac medications before your next appointment, please call your pharmacy.

## 2017-07-02 NOTE — Progress Notes (Signed)
No   Let amio continue to wash out.

## 2017-07-05 ENCOUNTER — Other Ambulatory Visit: Payer: Self-pay | Admitting: Family Medicine

## 2017-07-10 ENCOUNTER — Encounter (HOSPITAL_COMMUNITY): Payer: Self-pay | Admitting: Internal Medicine

## 2017-07-10 ENCOUNTER — Ambulatory Visit (HOSPITAL_COMMUNITY)
Admission: RE | Admit: 2017-07-10 | Discharge: 2017-07-10 | Disposition: A | Discharge status: Home / Self Care | Payer: Medicare Other | Source: Ambulatory Visit | Attending: Internal Medicine | Admitting: Internal Medicine

## 2017-07-10 VITALS — BP 110/70 | HR 67 | Wt 118.1 lb

## 2017-07-10 DIAGNOSIS — I059 Rheumatic mitral valve disease, unspecified: Secondary | ICD-10-CM | POA: Diagnosis not present

## 2017-07-10 DIAGNOSIS — I341 Nonrheumatic mitral (valve) prolapse: Secondary | ICD-10-CM | POA: Insufficient documentation

## 2017-07-10 DIAGNOSIS — Z91018 Allergy to other foods: Secondary | ICD-10-CM | POA: Insufficient documentation

## 2017-07-10 DIAGNOSIS — Z953 Presence of xenogenic heart valve: Secondary | ICD-10-CM

## 2017-07-10 DIAGNOSIS — Z7984 Long term (current) use of oral hypoglycemic drugs: Secondary | ICD-10-CM | POA: Diagnosis not present

## 2017-07-10 DIAGNOSIS — R0602 Shortness of breath: Secondary | ICD-10-CM | POA: Insufficient documentation

## 2017-07-10 DIAGNOSIS — I480 Paroxysmal atrial fibrillation: Secondary | ICD-10-CM | POA: Insufficient documentation

## 2017-07-10 DIAGNOSIS — E785 Hyperlipidemia, unspecified: Secondary | ICD-10-CM | POA: Insufficient documentation

## 2017-07-10 DIAGNOSIS — I4819 Other persistent atrial fibrillation: Secondary | ICD-10-CM

## 2017-07-10 DIAGNOSIS — F329 Major depressive disorder, single episode, unspecified: Secondary | ICD-10-CM | POA: Insufficient documentation

## 2017-07-10 DIAGNOSIS — Z7901 Long term (current) use of anticoagulants: Secondary | ICD-10-CM

## 2017-07-10 DIAGNOSIS — M419 Scoliosis, unspecified: Secondary | ICD-10-CM | POA: Diagnosis not present

## 2017-07-10 DIAGNOSIS — Z8261 Family history of arthritis: Secondary | ICD-10-CM | POA: Insufficient documentation

## 2017-07-10 DIAGNOSIS — I340 Nonrheumatic mitral (valve) insufficiency: Secondary | ICD-10-CM | POA: Diagnosis not present

## 2017-07-10 DIAGNOSIS — I509 Heart failure, unspecified: Secondary | ICD-10-CM | POA: Insufficient documentation

## 2017-07-10 DIAGNOSIS — I2510 Atherosclerotic heart disease of native coronary artery without angina pectoris: Secondary | ICD-10-CM

## 2017-07-10 DIAGNOSIS — Z8249 Family history of ischemic heart disease and other diseases of the circulatory system: Secondary | ICD-10-CM | POA: Insufficient documentation

## 2017-07-10 DIAGNOSIS — K449 Diaphragmatic hernia without obstruction or gangrene: Secondary | ICD-10-CM | POA: Insufficient documentation

## 2017-07-10 DIAGNOSIS — Z882 Allergy status to sulfonamides status: Secondary | ICD-10-CM | POA: Diagnosis not present

## 2017-07-10 DIAGNOSIS — Z79899 Other long term (current) drug therapy: Secondary | ICD-10-CM | POA: Insufficient documentation

## 2017-07-10 DIAGNOSIS — I110 Hypertensive heart disease with heart failure: Secondary | ICD-10-CM | POA: Diagnosis not present

## 2017-07-10 DIAGNOSIS — Z888 Allergy status to other drugs, medicaments and biological substances status: Secondary | ICD-10-CM | POA: Insufficient documentation

## 2017-07-10 DIAGNOSIS — I498 Other specified cardiac arrhythmias: Secondary | ICD-10-CM

## 2017-07-10 DIAGNOSIS — Z951 Presence of aortocoronary bypass graft: Secondary | ICD-10-CM

## 2017-07-10 DIAGNOSIS — F419 Anxiety disorder, unspecified: Secondary | ICD-10-CM | POA: Insufficient documentation

## 2017-07-10 DIAGNOSIS — Z885 Allergy status to narcotic agent status: Secondary | ICD-10-CM | POA: Insufficient documentation

## 2017-07-10 DIAGNOSIS — I481 Persistent atrial fibrillation: Secondary | ICD-10-CM | POA: Diagnosis not present

## 2017-07-10 DIAGNOSIS — K219 Gastro-esophageal reflux disease without esophagitis: Secondary | ICD-10-CM | POA: Diagnosis not present

## 2017-07-10 DIAGNOSIS — Z7982 Long term (current) use of aspirin: Secondary | ICD-10-CM | POA: Insufficient documentation

## 2017-07-10 DIAGNOSIS — R251 Tremor, unspecified: Secondary | ICD-10-CM | POA: Diagnosis not present

## 2017-07-10 DIAGNOSIS — I4581 Long QT syndrome: Secondary | ICD-10-CM | POA: Diagnosis not present

## 2017-07-10 DIAGNOSIS — E119 Type 2 diabetes mellitus without complications: Secondary | ICD-10-CM | POA: Insufficient documentation

## 2017-07-10 NOTE — Patient Instructions (Signed)
You have been referred to EP at [facility], they will call you to schedule  We will contact you in 3 months to schedule your next appointment.

## 2017-07-10 NOTE — Progress Notes (Signed)
-  Advanced Heart Failure Clinic Note   Primary Cardiologist: Dr. Harrington Challenger Primary HF: Dr. Haroldine Laws   HPI:  [name] is a 83 y.o. female with h/o MVP, HTN, DM2, scoliosis, and CAD. She had developed DOE over the past 2-3 months along with occasional peripheral edema. TEE 03/04/17 showed severe MR and severe LAE. L/RHC 03/28/17 with severe multivessel disease. Pt was referred for surgical consultation by Dr. Roxy Manns.   Pt underwent CABG x 1 and MVR replacement 04/23/17. Milrinone added 04/27/17 with Mixed venous saturation of 33%. HF team consulted for assistance. Pressures support adjusted and weaned as tolerated. Course complicated by Afib. Started on IV amiodarone and pt chemically converted to NSR overnight into 05/05/17.  Hospital course complicated by depressed coox in lower 50% range off milrinone. Pt continued to improve symptomatically and was thought to be a poor candidate for home milrinone with recent valve surgery and increase risk of infection. Coox 53.5% on day of discharge. Sent home without PICC with close follow up. Discharge weight 120 lbs  Admitted 05/18/17-05/22/17 with AKI. Lasix and Arlyce Harman were stopped. Required milrinone, and also went back into atrial fibrillation with RVR, but converted to NSR on Amio gtt. Discharged in NSR. Follow up Echo on 05/21/17 showed improved LV function with EF 35-40%. MVR was functioning appropriately.   She returns today for follow up. Amio stopped now all together with tremors. Can walk for about 1-2 minutes prior to getting SOB. Finished PT. Denies dizziness or lightheadedness. Weight at home ~ 115-118. No SOB with ADLs. She remains SOB on an incline or stairs. Can't afford cardiac rehab due to cost.   Echo 05/22/17: LVEF 35-40%, stable MVR.  Echo 04/26/17 LVEF 25-30%, Stable MVR, Moderate TR, Peak PA pressure 38 mm, RV moderately reduced function Echo TEE 03/04/17 LVEF normal, Mild MVP with regurgitant jets, predominant jet posteriorly, Severe  LAE.  R/LHC 03/28/17  1. Severe single-vessel coronary artery disease with 60% proximal and 99% distal RCA stenoses as well as chronic total occlusion of the ostial rPDA. The rPDA andr PL branches are supplied by left-to-right collaterals. 2. 70% proximal D2 stenosis is evident. Otherwise, there is mild, nonobstructive disease involving the LAD, LCx, and their branches. 3. Normal left and right heart filling pressures. Normal pulmonary artery pressure. Normal Fick cardiac output/index. RHC Procedural Findings: Hemodynamics (mmHg) RA mean 4 RV 30/2 PA 28/8 PCWP 7 AO 107/45 Cardiac Output (Fick) 4.67 Cardiac Index (Fick) 2.78  Review of systems complete and found to be negative unless listed in HPI.    Past Medical History:  Diagnosis Date  . Allergy   . Anxiety   . Arthritis   . Atrial enlargement, left   . CHF (congestive heart failure) ([place])   . Coronary artery disease   . Depression   . Diabetes mellitus without complication (HCC)    fasting cbg 110s  . Dyspnea   . GERD (gastroesophageal reflux disease)   . H/O hiatal hernia   . Headache(784.0)   . Heart murmur    06/10/2014 seeing new cardiologist  . Heart murmur   . HOH (hard of hearing)    HOH in left ear; needs to speak to patient in right ear  . Hyperlipidemia   . Hypertension   . Mitral regurgitation   . Mitral valve prolapse   . Palpitations    afib on 03/04/17 EKG  . PONV (postoperative nausea and vomiting)    Patient stated "I like the patch behind my ear"  .  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 S/P mitral valve replacement with bioprosthetic valve 04/23/2017   [facility] Mitral bovine pericardial tissue valve: Model 7300TFX, Serial [id number], size 31  . UTI (lower urinary tract infection)     Current Outpatient Prescriptions  Medication Sig Dispense Refill  . aspirin EC 81 MG EC tablet Take 1 tablet (81 mg total) by mouth daily.    . bisacodyl (DULCOLAX) 5 MG EC tablet  Take 10 mg by mouth daily as needed (for constipation.).    [name] citalopram (CELEXA) 20 MG tablet Take 1 tablet (20 mg total) by mouth daily. 90 tablet 3  . clonazePAM (KLONOPIN) 0.5 MG tablet Take one tablet by mouth PRN insomnia or anxiety. 20 tablet 0  . conjugated estrogens (PREMARIN) vaginal cream Place 1 Applicatorful vaginally daily. 42.5 g 12  . esomeprazole (NEXIUM) 20 MG capsule Take 20 mg by mouth daily at 12 noon.    . famotidine (PEPCID) 20 MG tablet Take 20 mg by mouth daily as needed for heartburn or indigestion.    . fexofenadine (ALLEGRA) 180 MG tablet Take 90 mg by mouth daily as needed for allergies or rhinitis (allergy headache).    . fluticasone (FLONASE) 50 MCG/ACT nasal spray SHAKE LIQUID AND USE 2 SPRAYS IN EACH NOSTRIL DAILY AS NEEDED FOR ALLERGIES 16 g 2  . furosemide (LASIX) 20 MG tablet Take 1 tablet (20 mg total) by mouth as needed for fluid or edema. 30 tablet 6  . Magnesium Oxide 200 MG TABS Take 1 tablet (200 mg total) by mouth 2 (two) times daily. 60 tablet 5  . metFORMIN (GLUCOPHAGE) 500 MG tablet Take 2 tablets (1,000 mg total) by mouth 2 (two) times daily with a meal. 360 tablet 3  . Multiple Vitamins-Minerals (ICAPS AREDS 2) CAPS Take 1 capsule by mouth 2 (two) times daily.    Glory Rosebush DELICA LANCETS 75F MISC USE TO TEST TWICE DAILY AS DIRECTED 100 each 0  . ONETOUCH VERIO test strip TEST BLOOD GLUCOSE TWICE DAILY 200 each 0  . potassium chloride (K-DUR) 10 MEQ tablet Take 1 tablet (10 mEq total) by mouth as needed (WHEN TAKING LASIX). 30 tablet 6  . sacubitril-valsartan (ENTRESTO) 24-26 MG Take 1 tablet by mouth 2 (two) times daily. 60 tablet 11  . trimethoprim (TRIMPEX) 100 MG tablet TAKE 1 TABLET(100 MG) BY MOUTH DAILY 90 tablet 0  . warfarin (COUMADIN) 2.5 MG tablet Take as directed by [facility] 30 tablet 2   No current facility-administered medications for this encounter.    Allergies  Allergen Reactions  . Cortisone Other (See Comments)     Insomnia, heart palpitations (po only)  . Statins Other (See Comments)    Myalgias   . Codeine Swelling    FACIAL SWELLING SEVERITY UNKNOWN  . Sulfonamide Derivatives     UNSPECIFIED REACTION   . Amiodarone Other (See Comments)    Tremors with 400 mg BID   . Lactose Intolerance (Gi) Nausea And Vomiting   Social History   Social History  . Marital status: Married    Spouse name: N/A  . Number of children: N/A  . Years of education: N/A   Occupational History  . Retired Retired   Social History Main Topics  . Smoking status: Never Smoker  . Smokeless tobacco: Never Used  . Alcohol use No  . Drug use: No  . Sexual activity: Not on file  Other Topics Concern  . Not on file   Social History Narrative   Married   Regular exercise   Family History  Problem Relation Age of Onset  . Heart failure Father   . Arthritis Father   . Hyperlipidemia Father   . Heart disease Father   . Arthritis Mother   . Hyperlipidemia Mother   . Hypertension Mother    Vitals:   07/10/17 1149  BP: 110/70  Pulse: 67  SpO2: 98%  Weight: 118 lb 1.9 oz (53.6 kg)    Wt Readings from Last 3 Encounters:  07/10/17 118 lb 1.9 oz (53.6 kg)  07/01/17 118 lb (53.5 kg)  06/26/17 118 lb 11.2 oz (53.8 kg)   PHYSICAL EXAM: General: Elderly appearing. No resp difficulty. HEENT: Normal Neck: Supple. JVP 6-7 with very mild HJR. Carotids 2+ bilat; no bruits. No thyromegaly or nodule noted. Cor: PMI nondisplaced. RRR, 2/6 systolic murmur Lungs: CTAB, normal effort. Abdomen: Soft, non-tender, non-distended, no HSM. No bruits or masses. +BS  Extremities: No cyanosis, clubbing, or rash. Trace ankle edema.  Neuro: Alert & orientedx3, cranial nerves grossly intact. moves all 4 extremities w/o difficulty. Affect pleasant   ECG: Junctional rhythm 65 bpm, QTc 445   ASSESSMENT & PLAN: 1. Chronic Combined Biventricular HF: EF dropped post op-->EF 25-30% with RV dysfunction. Now improved to 35-40% by  echo 9/18. - NYHA III - Volume status stable on exam.  - Continue Entresto 24/26 mg BID. BMET today.  - Not on Lasix with recent AKI, no need for lasix now - No beta blocker.  - Will avoid Arlyce Harman with recent AKI.    2. CAD s/p CABG x1 04/23/17  - No exertional chest pain. Occasional atypical pain around her CABG scar. - Continue ASA and statin.  - Can't afford cardiac rehab due to cost.   3. MVR - bioprosthetic valve and LA clipping.  - Stable by recent echo.  - Continue warfarin. Followed at [facility].  - Needs prophylactic ABX for any dental work.  - INR 1.5 on 07/01/17. Adjustments made. Follow up pending.   4. Paroxysmal  A fib  - EKG today shows junctional rhythm vs markedly prolonged 1st degree block. Will send to EP for evaluation. - She tolerates Afib poorly. She is now off Amiodarone with tremors and prolonged QTc on it.  - Continue to follow closely.  - Reminded her to get yearly eye exams.   Doing well overall. No change to meds. Refer to EP for PPM consideration depending on their interpretation of her EKG.   Shirley Friar, PA-C 07/10/17   Patient seen and examined with the above-signed Advanced Practice Provider and/or Housestaff. I personally reviewed laboratory data, imaging studies and relevant notes. I independently examined the patient and formulated the important aspects of the plan. I have edited the note to reflect any of my changes or salient points. I have personally discussed the plan with the patient and/or family.  She continues to improve.EF improved on last echo. Now NYHA II. Volume status looks good. No anginal symptoms. ECG reviewed personally. Possible junctional rhythm vs sinus with marked PR prolongation. Off amio. Hopefully conduction will improve. No current indication for PPM. Will refer to EP for their input. SBE prophylaxis discussed.   Glori Bickers, MD  3:23 PM

## 2017-07-11 ENCOUNTER — Ambulatory Visit (INDEPENDENT_AMBULATORY_CARE_PROVIDER_SITE_OTHER): Payer: Medicare Other | Admitting: *Deleted

## 2017-07-11 DIAGNOSIS — Z7901 Long term (current) use of anticoagulants: Secondary | ICD-10-CM

## 2017-07-11 DIAGNOSIS — Z5181 Encounter for therapeutic drug level monitoring: Secondary | ICD-10-CM | POA: Diagnosis not present

## 2017-07-11 DIAGNOSIS — I059 Rheumatic mitral valve disease, unspecified: Secondary | ICD-10-CM

## 2017-07-11 DIAGNOSIS — I481 Persistent atrial fibrillation: Secondary | ICD-10-CM

## 2017-07-11 DIAGNOSIS — I4819 Other persistent atrial fibrillation: Secondary | ICD-10-CM

## 2017-07-11 DIAGNOSIS — I4891 Unspecified atrial fibrillation: Secondary | ICD-10-CM | POA: Diagnosis not present

## 2017-07-11 DIAGNOSIS — I080 Rheumatic disorders of both mitral and aortic valves: Secondary | ICD-10-CM | POA: Diagnosis not present

## 2017-07-11 DIAGNOSIS — Z953 Presence of xenogenic heart valve: Secondary | ICD-10-CM | POA: Diagnosis not present

## 2017-07-11 LAB — POCT INR: INR: 1.3

## 2017-07-18 ENCOUNTER — Ambulatory Visit (INDEPENDENT_AMBULATORY_CARE_PROVIDER_SITE_OTHER): Payer: Medicare Other | Admitting: *Deleted

## 2017-07-18 DIAGNOSIS — I4891 Unspecified atrial fibrillation: Secondary | ICD-10-CM | POA: Diagnosis not present

## 2017-07-18 DIAGNOSIS — I481 Persistent atrial fibrillation: Secondary | ICD-10-CM

## 2017-07-18 DIAGNOSIS — Z953 Presence of xenogenic heart valve: Secondary | ICD-10-CM | POA: Diagnosis not present

## 2017-07-18 DIAGNOSIS — I080 Rheumatic disorders of both mitral and aortic valves: Secondary | ICD-10-CM

## 2017-07-18 DIAGNOSIS — I059 Rheumatic mitral valve disease, unspecified: Secondary | ICD-10-CM

## 2017-07-18 DIAGNOSIS — Z7901 Long term (current) use of anticoagulants: Secondary | ICD-10-CM | POA: Diagnosis not present

## 2017-07-18 DIAGNOSIS — I4819 Other persistent atrial fibrillation: Secondary | ICD-10-CM

## 2017-07-18 LAB — POCT INR: INR: 1.3

## 2017-07-24 ENCOUNTER — Ambulatory Visit: Payer: Medicare Other | Admitting: Family Medicine

## 2017-07-30 ENCOUNTER — Ambulatory Visit: Payer: Medicare Other | Admitting: Cardiology

## 2017-07-30 ENCOUNTER — Encounter (INDEPENDENT_AMBULATORY_CARE_PROVIDER_SITE_OTHER): Payer: Self-pay

## 2017-07-30 ENCOUNTER — Ambulatory Visit (INDEPENDENT_AMBULATORY_CARE_PROVIDER_SITE_OTHER): Payer: Medicare Other | Admitting: *Deleted

## 2017-07-30 ENCOUNTER — Encounter: Payer: Self-pay | Admitting: Cardiology

## 2017-07-30 VITALS — BP 138/62 | HR 66 | Ht 66.0 in | Wt 121.8 lb

## 2017-07-30 DIAGNOSIS — Z952 Presence of prosthetic heart valve: Secondary | ICD-10-CM | POA: Diagnosis not present

## 2017-07-30 DIAGNOSIS — I25708 Atherosclerosis of coronary artery bypass graft(s), unspecified, with other forms of angina pectoris: Secondary | ICD-10-CM

## 2017-07-30 DIAGNOSIS — I4891 Unspecified atrial fibrillation: Secondary | ICD-10-CM | POA: Diagnosis not present

## 2017-07-30 DIAGNOSIS — I498 Other specified cardiac arrhythmias: Secondary | ICD-10-CM | POA: Diagnosis not present

## 2017-07-30 DIAGNOSIS — I059 Rheumatic mitral valve disease, unspecified: Secondary | ICD-10-CM

## 2017-07-30 DIAGNOSIS — Z7901 Long term (current) use of anticoagulants: Secondary | ICD-10-CM | POA: Diagnosis not present

## 2017-07-30 DIAGNOSIS — I5022 Chronic systolic (congestive) heart failure: Secondary | ICD-10-CM | POA: Diagnosis not present

## 2017-07-30 DIAGNOSIS — I4819 Other persistent atrial fibrillation: Secondary | ICD-10-CM

## 2017-07-30 DIAGNOSIS — Z953 Presence of xenogenic heart valve: Secondary | ICD-10-CM | POA: Diagnosis not present

## 2017-07-30 DIAGNOSIS — I481 Persistent atrial fibrillation: Secondary | ICD-10-CM

## 2017-07-30 LAB — POCT INR: INR: 1.5

## 2017-07-30 MED ORDER — METOPROLOL SUCCINATE ER 50 MG PO TB24: 50.0000 mg | ORAL_TABLET | Freq: Every day | ORAL | 3 refills | Status: DC

## 2017-07-30 NOTE — Patient Instructions (Signed)
Today Nov 20th take 2 tablets (5mg ) then change coumadin dose to 1 and 1/2 tablets (3.75mg ) daily except 1 tablet (2.5mg ) on Mondays and Fridays Recheck INR in 2 weeks Amiodarone discontinued on 06/07/2017

## 2017-07-30 NOTE — Progress Notes (Signed)
Electrophysiology Office Note   Date:  07/30/2017   ID:  [name], DOB [birth year], MRN [id number]  PCP:  Eulas Post, MD  Cardiologist:  [place] Primary Electrophysiologist:  Ho Parisi Meredith Leeds, MD    Chief Complaint  Patient presents with  . Advice Only    Junctional rthym/AV block     History of Present Illness: [name] is a 83 y.o. female who is being seen today for the evaluation of atrial fibrillation at the request of Burchette, Alinda Sierras, MD. Presenting today for electrophysiology evaluation.  She has a history of mitral valve prolapse, hypertension, type 2 diabetes, scoliosis, and coronary artery disease.  She develops peripheral edema and was found to have severe mitral regurgitation and left atrial enlargement on echo 03/04/2017.  Cath showed severe coronary disease and she underwent CABG x1 and MVR on 04/23/17.  Her postop course was complicated by atrial fibrillation.  She was started on IV amiodarone and chemically cardioverted to sinus rhythm.  Since then, she was readmitted with acute renal failure.  This was in September 2018.  She required milrinone and went back into atrial fibrillation but did convert to sinus rhythm on amiodarone.  She has since stopped amiodarone due to tremors.  Since her surgery, she has felt quite poorly.  She has quite a bit of dyspnea on exertion.  She can walk up the stairs, but has to rest for up to 15 minutes to catch her breath.  She was short of breath when walking into the office today.  Today, she denies symptoms of palpitations, chest pain, PND, lower extremity edema, claudication, dizziness, presyncope, syncope, bleeding, or neurologic sequela. The patient is tolerating medications without difficulties.    Past Medical History:  Diagnosis Date  . Allergy   . Anxiety   . Arthritis   . Atrial enlargement, left   . CHF (congestive heart failure) ([place])   . Coronary artery disease   . Depression   . Diabetes mellitus  without complication (HCC)    fasting cbg 110s  . Dyspnea   . GERD (gastroesophageal reflux disease)   . H/O hiatal hernia   . Headache(784.0)   . Heart murmur    06/10/2014 seeing new cardiologist  . Heart murmur   . HOH (hard of hearing)    HOH in left ear; needs to speak to patient in right ear  . Hyperlipidemia   . Hypertension   . Mitral regurgitation   . Mitral valve prolapse   . Palpitations    afib on 03/04/17 EKG  . PONV (postoperative nausea and vomiting)    Patient stated "I like the patch behind my ear"  . 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 S/P mitral valve replacement with bioprosthetic valve 04/23/2017   [facility] Mitral bovine pericardial tissue valve: Model 7300TFX, Serial [id number], size 31  . UTI (lower urinary tract infection)    Past Surgical History:  Procedure Laterality Date  . ABDOMINAL HYSTERECTOMY    . Anterior posterior and enterocele repairs  09/21/2004   With uterosacral cardinal colposuspension, partial colpocleisis; Selinda Orion, MD  . BACK SURGERY    . BREAST ENHANCEMENT SURGERY  02/25/2002   Bilateral reduction and excision of accessory breast tissue underneath the left breast; Aretha Parrot., MD  . BREAST SURGERY  13   reduction  . CARDIAC CATHETERIZATION     no PCI  . CARDIAC  CATHETERIZATION  05/02/2017  . CATARACT EXTRACTION Bilateral   . CLIPPING OF LEFT ATRIAL APPENDAGE  04/23/2017   Performed by [name], MD at [place]    . CORONARY ARTERY BYPASS GRAFTING (CABG), ON PUMP, TIMES ONE, USING ENDOSCOPICALLY HARVESTED RIGHT GREATER SAPHENOUS VEIN N/A 04/23/2017   Performed by [name], MD at [place]  . EYE SURGERY Bilateral    cataract removal  . Left Lumbar Five-Sacral One Microdiskectomy Left 06/08/2014   Performed by Kristeen Miss, MD at [facility] NEURO ORS  . LUMBAR FIVE-SACRAL ONE EXTRAFORAMINAL DISKECTOMY WITH METREX N/A 03/16/2014   Performed by Kristeen Miss, MD at [facility]  NEURO ORS  . Lumbar five-sacral one Posterior lumbar interbody fusion N/A 03/08/2015   Performed by Kristeen Miss, MD at [facility] NEURO ORS  . MITRAL VALVE (MV) REPLACEMENT N/A 04/23/2017   Performed by [name], MD at [place]  . PELVIC FLOOR REPAIR    . Right/Left Heart Cath and Coronary Angiography N/A 03/28/2017   Performed by Nelva Bush, MD at [place] [facility]  . ROTATOR CUFF REPAIR Bilateral    11.12  . T8-L1 posterior lateral fusion with decompression T12-L1 N/A 08/08/2015   Performed by Kristeen Miss, MD at [facility] NEURO ORS  . TONSILLECTOMY AND ADENOIDECTOMY    . TRANSESOPHAGEAL ECHOCARDIOGRAM (TEE) N/A 04/23/2017   Performed by [name], MD at [place]  . TRANSESOPHAGEAL ECHOCARDIOGRAM (TEE) N/A 03/04/2017   Performed by Fay Records, MD at [facility] ENDOSCOPY     Current Outpatient Medications  Medication Sig Dispense Refill  . aspirin EC 81 MG EC tablet Take 1 tablet (81 mg total) by mouth daily.    . bisacodyl (DULCOLAX) 5 MG EC tablet Take 10 mg by mouth daily as needed (for constipation.).    [name] citalopram (CELEXA) 20 MG tablet Take 1 tablet (20 mg total) by mouth daily. 90 tablet 3  . conjugated estrogens (PREMARIN) vaginal cream Place 1 Applicatorful vaginally daily. 42.5 g 12  . esomeprazole (NEXIUM) 20 MG capsule Take 20 mg by mouth daily at 12 noon.    . famotidine (PEPCID) 20 MG tablet Take 20 mg by mouth daily as needed for heartburn or indigestion.    . fexofenadine (ALLEGRA) 180 MG tablet Take 90 mg by mouth daily as needed for allergies or rhinitis (allergy headache).    . fluticasone (FLONASE) 50 MCG/ACT nasal spray SHAKE LIQUID AND USE 2 SPRAYS IN EACH NOSTRIL DAILY AS NEEDED FOR ALLERGIES 16 g 2  . furosemide (LASIX) 20 MG tablet Take 1 tablet (20 mg total) by mouth as needed for fluid or edema. 30 tablet 6  . Magnesium Oxide 200 MG TABS Take 1 tablet (200 mg total) by mouth 2 (two) times daily. 60 tablet 5  . metFORMIN (GLUCOPHAGE) 500 MG tablet Take 2 tablets  (1,000 mg total) by mouth 2 (two) times daily with a meal. 360 tablet 3  . Multiple Vitamins-Minerals (ICAPS AREDS 2) CAPS Take 1 capsule by mouth 2 (two) times daily.    Glory Rosebush DELICA LANCETS 64P MISC USE TO TEST TWICE DAILY AS DIRECTED 100 each 0  . ONETOUCH VERIO test strip TEST BLOOD GLUCOSE TWICE DAILY 200 each 0  . potassium chloride (K-DUR) 10 MEQ tablet Take 1 tablet (10 mEq total) by mouth as needed (WHEN TAKING LASIX). 30 tablet 6  . sacubitril-valsartan (ENTRESTO) 24-26 MG Take 1 tablet by mouth 2 (two) times daily. 60 tablet 11  .  trimethoprim (TRIMPEX) 100 MG tablet TAKE 1 TABLET(100 MG) BY MOUTH DAILY 90 tablet 0  . warfarin (COUMADIN) 2.5 MG tablet Take as directed by [facility] 30 tablet 2   No current facility-administered medications for this visit.     Allergies:   Cortisone; Statins; Codeine; Sulfonamide derivatives; Amiodarone; and Lactose intolerance (gi)   Social History:  The patient  reports that  has never smoked. she has never used smokeless tobacco. She reports that she does not drink alcohol or use drugs.   Family History:  The patient's family history includes Arthritis in her father and mother; Heart disease in her father; Heart failure in her father; Hyperlipidemia in her father and mother; Hypertension in her mother.    ROS:  Please see the history of present illness.   Otherwise, review of systems is positive for shortness of breath lying down, hearing loss, dyspnea on exertion, back pain.   All other systems are reviewed and negative.    PHYSICAL EXAM: VS:  BP 138/62   Pulse 66   Ht 5\' 6"  (1.676 m)   Wt 121 lb 12.8 oz (55.2 kg)   SpO2 99%   BMI 19.66 kg/m  , BMI Body mass index is 19.66 kg/m. GEN: Well nourished, well developed, in no acute distress  HEENT: normal  Neck: no JVD, carotid bruits, or masses Cardiac: RRR; no murmurs, rubs, or gallops,no edema  Respiratory:  clear to auscultation bilaterally, normal work of breathing GI:  soft, nontender, nondistended, + BS MS: no deformity or atrophy  Skin: warm and dry Neuro:  Strength and sensation are intact Psych: euthymic mood, full affect  EKG:  EKG is not ordered today. Personal review of the ekg ordered 07/10/17 shows junctional rhythm, prolonged QTC  Recent Labs: 05/18/2017: B Natriuretic Peptide 3,509.1; TSH 1.716 05/28/2017: Hemoglobin 11.6; Platelets 375 06/07/2017: ALT 9 06/19/2017: BUN 15; Creatinine, Ser 1.00; Magnesium 1.4; NT-Pro BNP 489; Potassium 4.3; Sodium 135    Lipid Panel     Component Value Date/Time   CHOL 163 08/22/2016 0746   TRIG 101 08/22/2016 0746   HDL 65 08/22/2016 0746   CHOLHDL 2.5 08/22/2016 0746   VLDL 20 08/22/2016 0746   LDLCALC 78 08/22/2016 0746   LDLDIRECT 143.6 06/04/2012 0931     Wt Readings from Last 3 Encounters:  07/30/17 121 lb 12.8 oz (55.2 kg)  07/10/17 118 lb 1.9 oz (53.6 kg)  07/01/17 118 lb (53.5 kg)      Other studies Reviewed: Additional studies/ records that were reviewed today include: TTE 05/21/17  Review of the above records today demonstrates:  - Left ventricle: The cavity size was normal. Wall thickness was   increased in a pattern of mild LVH. Systolic function was   moderately reduced. The estimated ejection fraction was in the   range of 35% to 40%. There is akinesis of the apical myocardium. - Aortic valve: There was trivial regurgitation. - Mitral valve: A bioprosthesis was present. - Left atrium: The atrium was severely dilated. - Pulmonary arteries: PA peak pressure: 34 mm Hg (S).  Cath 03/28/17 1. Severe single-vessel coronary artery disease with 60% proximal and 99% distal RCA stenoses as well as chronic total occlusion of the ostial rPDA. The rPDA andr PL branches are supplied by left-to-right collaterals. 2. 70% proximal D2 stenosis is evident. Otherwise, there is mild, nonobstructive disease involving the LAD, LCx, and their branches. 3. Normal left and right heart filling pressures.  Normal pulmonary artery pressure. Normal Fick cardiac  output/index.  ASSESSMENT AND PLAN:  1.  Chronic systolic heart failure: EF improved to 35-40% by echo 9/18.  Class III heart failure symptoms.  Currently on Entresto.  She is in a junctional rhythm, and I am concerned that this is contributing to her shortness of breath, as it is unclear she can increase her heart rate with exertion.  We Mollee Neer start her on 11 of Toprol-XL today to see if we can suppress her junctional rhythm so her AV node Dewana Ammirati pick back up.  If not, we did discuss the possibility of pacemaker implant.  We Lilliann Rossetti see her back in 1 month.  2.  Paroxysmal atrial fibrillation: On warfarin.  Tolerates poorly.  Is off amiodarone with tremors and a prolonged QTC.  Is unaware of atrial fibrillation out of the hospital setting.  We Tai Syfert make no changes at this time.  This patients CHA2DS2-VASc Score and unadjusted Ischemic Stroke Rate (% per year) is equal to 7.2 % stroke rate/year from a score of 5  Above score calculated as 1 point each if present [CHF, HTN, DM, Vascular=MI/PAD/Aortic Plaque, Age if 65-74, or Female] Above score calculated as 2 points each if present [Age > 75, or Stroke/TIA/TE]  3.  Coronary artery disease: Status post CABG x1 04/2017.  No current chest pain.  4.  Mitral valve regurgitation: Status post bioprosthetic mitral valve and left atrial appendage clipping.  Stable by most recent echo.  Current medicines are reviewed at length with the patient today.   The patient does not have concerns regarding her medicines.  The following changes were made today: Start Toprol-XL  Labs/ tests ordered today include:  No orders of the defined types were placed in this encounter.    Disposition:   FU with Mattalynn Crandle 1 month  Signed, Viral Schramm Meredith Leeds, MD  07/30/2017 9:32 AM     [facility] [address] [place] [address] [phone] (office) [phone] (fax)

## 2017-07-30 NOTE — Patient Instructions (Addendum)
Medication Instructions:  Your physician has recommended you make the following change in your medication:  1. START Toprol XL 50 mg once daily  * If you need a refill on your cardiac medications before your next appointment, please call your pharmacy. *  Labwork: None ordered  Testing/Procedures: None ordered  Follow-Up: Your physician recommends that you schedule a follow-up appointment in: 1 month with Dr. Curt Bears.  Thank you for choosing [facility]!!   Lori Curet, RN [phone]  Any Other Special Instructions Will Be Listed Below (If Applicable).  Metoprolol extended-release tablets What is this medicine? METOPROLOL (me TOE proe lole) is a beta-blocker. Beta-blockers reduce the workload on the heart and help it to beat more regularly. This medicine is used to treat high blood pressure and to prevent chest pain. It is also used to after a heart attack and to prevent an additional heart attack from occurring. This medicine may be used for other purposes; ask your health care provider or pharmacist if you have questions. COMMON BRAND NAME(S): toprol, Toprol XL What should I tell my health care provider before I take this medicine? They need to know if you have any of these conditions: -diabetes -heart or vessel disease like slow heart rate, worsening heart failure, heart block, sick sinus syndrome or Raynaud's disease -kidney disease -liver disease -lung or breathing disease, like asthma or emphysema -pheochromocytoma -thyroid disease -an unusual or allergic reaction to metoprolol, other beta-blockers, medicines, foods, dyes, or preservatives -pregnant or trying to get pregnant -breast-feeding How should I use this medicine? Take this medicine by mouth with a glass of water. Follow the directions on the prescription label. Do not crush or chew. Take this medicine with or immediately after meals. Take your doses at regular intervals. Do not take more medicine than  directed. Do not stop taking this medicine suddenly. This could lead to serious heart-related effects. Talk to your pediatrician regarding the use of this medicine in children. While this drug may be prescribed for children as young as 6 years for selected conditions, precautions do apply. Overdosage: If you think you have taken too much of this medicine contact a poison control center or emergency room at once. NOTE: This medicine is only for you. Do not share this medicine with others. What if I miss a dose? If you miss a dose, take it as soon as you can. If it is almost time for your next dose, take only that dose. Do not take double or extra doses. What may interact with this medicine? This medicine may interact with the following medications: -certain medicines for blood pressure, heart disease, irregular heart beat -certain medicines for depression, like monoamine oxidase (MAO) inhibitors, fluoxetine, or paroxetine -clonidine -dobutamine -epinephrine -isoproterenol -reserpine This list may not describe all possible interactions. Give your health care provider a list of all the medicines, herbs, non-prescription drugs, or dietary supplements you use. Also tell them if you smoke, drink alcohol, or use illegal drugs. Some items may interact with your medicine. What should I watch for while using this medicine? Visit your doctor or health care professional for regular check ups. Contact your doctor right away if your symptoms worsen. Check your blood pressure and pulse rate regularly. Ask your health care professional what your blood pressure and pulse rate should be, and when you should contact them. You may get drowsy or dizzy. Do not drive, use machinery, or do anything that needs mental alertness until you know how this medicine affects you.  Do not sit or stand up quickly, especially if you are an older patient. This reduces the risk of dizzy or fainting spells. Contact your doctor if these  symptoms continue. Alcohol may interfere with the effect of this medicine. Avoid alcoholic drinks. What side effects may I notice from receiving this medicine? Side effects that you should report to your doctor or health care professional as soon as possible: -allergic reactions like skin rash, itching or hives -cold or numb hands or feet -depression -difficulty breathing -faint -fever with sore throat -irregular heartbeat, chest pain -rapid weight gain -swollen legs or ankles Side effects that usually do not require medical attention (report to your doctor or health care professional if they continue or are bothersome): -anxiety or nervousness -change in sex drive or performance -dry skin -headache -nightmares or trouble sleeping -short term memory loss -stomach upset or diarrhea -unusually tired This list may not describe all possible side effects. Call your doctor for medical advice about side effects. You may report side effects to FDA at 1-800-FDA-1088. Where should I keep my medicine? Keep out of the reach of children. Store at room temperature between 15 and 30 degrees C (59 and 86 degrees F). Throw away any unused medicine after the expiration date. NOTE: This sheet is a summary. It may not cover all possible information. If you have questions about this medicine, talk to your doctor, pharmacist, or health care provider.  2018 Elsevier/Gold Standard (2013-05-01 14:41:37)

## 2017-07-31 ENCOUNTER — Other Ambulatory Visit: Payer: Self-pay

## 2017-07-31 ENCOUNTER — Telehealth: Payer: Self-pay | Admitting: Family Medicine

## 2017-07-31 MED ORDER — CITALOPRAM HYDROBROMIDE 20 MG PO TABS: 20.0000 mg | ORAL_TABLET | Freq: Every day | ORAL | 3 refills | Status: DC

## 2017-07-31 NOTE — Telephone Encounter (Signed)
Copied from [place] [phone]. Topic: General - Other >> Jul 31, 2017  8:33 AM Oneta Rack wrote: Osvaldo Human name: Colon Flattery  Relation to IT:[id number]  Call back number:[phone] Pharmacy: [facility] [place] [phone] (Phone) [phone] (Fax)   Reason for call:  Spouse spoke with [facility] today and as per mail order citalopram (CELEXA) 20 MG tablet was never received. Chart reflects Rx was sent over on 06/10/17 E-Prescribing Status: Receipt confirmed by pharmacy (06/10/2017  4:21 PM EDT). Spouse would like Rx sent again, please advise

## 2017-07-31 NOTE — Telephone Encounter (Signed)
Copied from [place] [phone]. Topic: General - Other >> Jul 31, 2017  8:33 AM Oneta Rack wrote: Osvaldo Human name: Colon Flattery  Relation to NM:[id number]  Call back number:[phone] Pharmacy: [facility] [place] [phone] (Phone) [phone] (Fax)   Reason for call:  Spouse spoke with [facility] today and as per mail order citalopram (CELEXA) 20 MG tablet was never received. Chart reflects Rx was sent over on 06/10/17 E-Prescribing Status: Receipt confirmed by pharmacy (06/10/2017  4:21 PM EDT). Spouse would like Rx sent again, please advise

## 2017-08-05 ENCOUNTER — Telehealth: Payer: Self-pay | Admitting: Cardiology

## 2017-08-05 NOTE — Telephone Encounter (Signed)
Pt reports stopping Toprol stating that it, "totally disrupted me".  Began feeling light headed late Friday night and this lasted through Sunday. BP ranging from 101/53 to 117/56. States she is feeling better today and issues have "cleared up today". Pt understands I will have Dr. Curt Bears review and call her once advised on. She is agreeable to plan.

## 2017-08-05 NOTE — Telephone Encounter (Signed)
New message      Pt c/o medication issue:  1. Name of Medication:metoprolol succinate (TOPROL-XL) 50 MG 24 hr tablet  2. How are you currently taking this medication (dosage and times per day)? 1 tablet daily - stopped medication 2 days ago   3. Are you having a reaction (difficulty breathing--STAT)?  yes  4. What is your medication issue? Blood pressure dropped and she got dizzy

## 2017-08-06 NOTE — Telephone Encounter (Signed)
Advised patient to decrease to 25 mg daily to see if improvement made in low BPs, per Dr. Curt Bears. She is going to try reduced dosage over the next several days and let me know.  If I don't speak with her this week I will follow up next week. Patient verbalized understanding and agreeable to plan.

## 2017-08-09 MED ORDER — CITALOPRAM HYDROBROMIDE 20 MG PO TABS: 20.0000 mg | ORAL_TABLET | Freq: Every day | ORAL | 3 refills | Status: DC

## 2017-08-09 NOTE — Telephone Encounter (Signed)
Rx resent.

## 2017-08-12 ENCOUNTER — Ambulatory Visit: Payer: Medicare Other | Admitting: Family Medicine

## 2017-08-13 ENCOUNTER — Ambulatory Visit (INDEPENDENT_AMBULATORY_CARE_PROVIDER_SITE_OTHER): Payer: Medicare Other | Admitting: Pharmacist

## 2017-08-13 DIAGNOSIS — I481 Persistent atrial fibrillation: Secondary | ICD-10-CM

## 2017-08-13 DIAGNOSIS — Z953 Presence of xenogenic heart valve: Secondary | ICD-10-CM | POA: Diagnosis not present

## 2017-08-13 DIAGNOSIS — Z7901 Long term (current) use of anticoagulants: Secondary | ICD-10-CM

## 2017-08-13 DIAGNOSIS — I059 Rheumatic mitral valve disease, unspecified: Secondary | ICD-10-CM | POA: Diagnosis not present

## 2017-08-13 DIAGNOSIS — I4891 Unspecified atrial fibrillation: Secondary | ICD-10-CM | POA: Diagnosis not present

## 2017-08-13 DIAGNOSIS — I4819 Other persistent atrial fibrillation: Secondary | ICD-10-CM

## 2017-08-13 LAB — POCT INR: INR: 3.9

## 2017-08-13 MED ORDER — WARFARIN SODIUM 2.5 MG PO TABS: ORAL_TABLET | ORAL | 0 refills | Status: DC

## 2017-08-13 NOTE — Patient Instructions (Signed)
Skip your Coumadin today, take 1/2 tablet tomorrow, then continue taking 1.5 tablets daily except 1 tablet on Mondays and Fridays Recheck INR in 2 weeks at office visit with Dr Curt Bears.

## 2017-08-19 ENCOUNTER — Ambulatory Visit: Payer: Medicare Other | Admitting: Thoracic Surgery (Cardiothoracic Vascular Surgery)

## 2017-08-29 ENCOUNTER — Ambulatory Visit: Payer: Medicare Other | Admitting: Cardiology

## 2017-08-29 ENCOUNTER — Ambulatory Visit (INDEPENDENT_AMBULATORY_CARE_PROVIDER_SITE_OTHER): Payer: Medicare Other | Admitting: Pharmacist

## 2017-08-29 ENCOUNTER — Other Ambulatory Visit: Payer: Self-pay | Admitting: Cardiology

## 2017-08-29 ENCOUNTER — Encounter: Payer: Self-pay | Admitting: Cardiology

## 2017-08-29 ENCOUNTER — Encounter: Payer: Self-pay | Admitting: *Deleted

## 2017-08-29 VITALS — BP 120/62 | HR 46 | Ht 66.5 in | Wt 124.0 lb

## 2017-08-29 DIAGNOSIS — Z7901 Long term (current) use of anticoagulants: Secondary | ICD-10-CM

## 2017-08-29 DIAGNOSIS — Z953 Presence of xenogenic heart valve: Secondary | ICD-10-CM | POA: Diagnosis not present

## 2017-08-29 DIAGNOSIS — I059 Rheumatic mitral valve disease, unspecified: Secondary | ICD-10-CM

## 2017-08-29 DIAGNOSIS — I480 Paroxysmal atrial fibrillation: Secondary | ICD-10-CM

## 2017-08-29 DIAGNOSIS — I4891 Unspecified atrial fibrillation: Secondary | ICD-10-CM

## 2017-08-29 DIAGNOSIS — Z952 Presence of prosthetic heart valve: Secondary | ICD-10-CM | POA: Diagnosis not present

## 2017-08-29 DIAGNOSIS — I25810 Atherosclerosis of coronary artery bypass graft(s) without angina pectoris: Secondary | ICD-10-CM | POA: Diagnosis not present

## 2017-08-29 DIAGNOSIS — I481 Persistent atrial fibrillation: Secondary | ICD-10-CM

## 2017-08-29 DIAGNOSIS — Z01812 Encounter for preprocedural laboratory examination: Secondary | ICD-10-CM

## 2017-08-29 DIAGNOSIS — R001 Bradycardia, unspecified: Secondary | ICD-10-CM

## 2017-08-29 DIAGNOSIS — I498 Other specified cardiac arrhythmias: Secondary | ICD-10-CM

## 2017-08-29 DIAGNOSIS — I4819 Other persistent atrial fibrillation: Secondary | ICD-10-CM

## 2017-08-29 LAB — POCT INR: INR: 1.4

## 2017-08-29 NOTE — Patient Instructions (Signed)
Medication Instructions:  Your physician recommends that you continue on your current medications as directed. Please refer to the Current Medication list given to you today.     * If you need a refill on your cardiac medications before your next appointment, please call your pharmacy. *  Labwork: Your physician recommends that you return for pre procedure lab work between 09/02/17 - 09/13/17  Testing/Procedures: Your physician has recommended that you have a pacemaker inserted. A pacemaker is a small device that is placed under the skin of your chest or abdomen to help control abnormal heart rhythms. This device uses electrical pulses to prompt the heart to beat at a normal rate. Pacemakers are used to treat heart rhythms that are too slow. Wire (leads) are attached to the pacemaker that goes into the chambers of you heart. This is done in the hospital and usually requires and overnight stay. Please see the instruction sheet given to you today for more information.  Follow-Up: Your physician recommends that you schedule a wound check appointment 10-14 days, after your procedure on 09/16/2017, with the device clinic.  Your physician recommends that you schedule a follow up appointment in 91 days, after your procedure on 09/16/2017, with Dr. Curt Bears.  Thank you for choosing [facility]!!   Trinidad Curet, RN [phone]  Any Other Special Instructions Will Be Listed Below (If Applicable).   Pacemaker Implantation, Adult Pacemaker implantation is a procedure to place a pacemaker inside your chest. A pacemaker is a small computer that sends electrical signals to the heart and helps your heart beat normally. A pacemaker also stores information about your heart rhythms. You may need pacemaker implantation if you:  Have a slow heartbeat (bradycardia).  Faint (syncope).  Have shortness of breath (dyspnea) due to heart problems.  The pacemaker attaches to your heart through a wire, called a  lead. Sometimes just one lead is needed. Other times, there will be two leads. There are two types of pacemakers:  Transvenous pacemaker. This type is placed under the skin or muscle of your chest. The lead goes through a vein in the chest area to reach the inside of the heart.  Epicardial pacemaker. This type is placed under the skin or muscle of your chest or belly. The lead goes through your chest to the outside of the heart.  Tell a health care provider about:  Any allergies you have.  All medicines you are taking, including vitamins, herbs, eye drops, creams, and over-the-counter medicines.  Any problems you or family members have had with anesthetic medicines.  Any blood or bone disorders you have.  Any surgeries you have had.  Any medical conditions you have.  Whether you are pregnant or may be pregnant. What are the risks? Generally, this is a safe procedure. However, problems may occur, including:  Infection.  Bleeding.  Failure of the pacemaker or the lead.  Collapse of a lung or bleeding into a lung.  Blood clot inside a blood vessel with a lead.  Damage to the heart.  Infection inside the heart (endocarditis).  Allergic reactions to medicines.  What happens before the procedure? Staying hydrated Follow instructions from your health care provider about hydration, which may include:  Up to 2 hours before the procedure - you may continue to drink clear liquids, such as water, clear fruit juice, black coffee, and plain tea.  Eating and drinking restrictions Follow instructions from your health care provider about eating and drinking, which may include:  8  hours before the procedure - stop eating heavy meals or foods such as meat, fried foods, or fatty foods.  6 hours before the procedure - stop eating light meals or foods, such as toast or cereal.  6 hours before the procedure - stop drinking milk or drinks that contain milk.  2 hours before the  procedure - stop drinking clear liquids.  Medicines  Ask your health care provider about: ? Changing or stopping your regular medicines. This is especially important if you are taking diabetes medicines or blood thinners. ? Taking medicines such as aspirin and ibuprofen. These medicines can thin your blood. Do not take these medicines before your procedure if your health care provider instructs you not to.  You may be given antibiotic medicine to help prevent infection. General instructions  You will have a heart evaluation. This may include an electrocardiogram (ECG), chest X-ray, and heart imaging (echocardiogram,  or echo) tests.  You will have blood tests.  Do not use any products that contain nicotine or tobacco, such as cigarettes and e-cigarettes. If you need help quitting, ask your health care provider.  Plan to have someone take you home from the hospital or clinic.  If you will be going home right after the procedure, plan to have someone with you for 24 hours.  Ask your health care provider how your surgical site will be marked or identified. What happens during the procedure?  To reduce your risk of infection: ? Your health care team will wash or sanitize their hands. ? Your skin will be washed with soap. ? Hair may be removed from the surgical area.  An IV tube will be inserted into one of your veins.  You will be given one or more of the following: ? A medicine to help you relax (sedative). ? A medicine to numb the area (local anesthetic). ? A medicine to make you fall asleep (general anesthetic).  If you are getting a transvenous pacemaker: ? An incision will be made in your upper chest. ? A pocket will be made for the pacemaker. It may be placed under the skin or between layers of muscle. ? The lead will be inserted into a blood vessel that returns to the heart. ? While X-rays are taken by an imaging machine (fluoroscopy), the lead will be advanced through the  vein to the inside of your heart. ? The other end of the lead will be tunneled under the skin and attached to the pacemaker.  If you are getting an epicardial pacemaker: ? An incision will be made near your ribs or breastbone (sternum) for the lead. ? The lead will be attached to the outside of your heart. ? Another incision will be made in your chest or upper belly to create a pocket for the pacemaker. ? The free end of the lead will be tunneled under the skin and attached to the pacemaker.  The transvenous or epicardial pacemaker will be tested. Imaging studies may be done to check the lead position.  The incisions will be closed with stitches (sutures), adhesive strips, or skin glue.  Bandages (dressing) will be placed over the incisions. The procedure may vary among health care providers and hospitals. What happens after the procedure?  Your blood pressure, heart rate, breathing rate, and blood oxygen level will be monitored until the medicines you were given have worn off.  You will be given antibiotics and pain medicine.  ECG and chest x-rays will be done.  You will  wear a continuous type of ECG (Holter monitor) to check your heart rhythm.  Your health care provider willprogram the pacemaker.  Do not drive for 24 hours if you received a sedative. This information is not intended to replace advice given to you by your health care provider. Make sure you discuss any questions you have with your health care provider. Document Released: 08/17/2002 Document Revised: 03/16/2016 Document Reviewed: 02/08/2016 Elsevier Interactive Patient Education  [employer].

## 2017-08-29 NOTE — Progress Notes (Signed)
Electrophysiology Office Note   Date:  08/29/2017   ID:  [name], [name] 1934/07/11, MRN [id number]  PCP:  Eulas Post, MD  Cardiologist:  [place] Primary Electrophysiologist:  Bart Ashford Meredith Leeds, MD    Chief Complaint  Patient presents with  . Follow-up    Junctional rhythm     History of Present Illness: [name] is a 83 y.o. female who is being seen today for the evaluation of atrial fibrillation at the request of Burchette, Alinda Sierras, MD. Presenting today for electrophysiology evaluation.  She has a history of mitral valve prolapse, hypertension, type 2 diabetes, scoliosis, and coronary artery disease.  She develops peripheral edema and was found to have severe mitral regurgitation and left atrial enlargement on echo 03/04/2017.  Cath showed severe coronary disease and she underwent CABG x1 and MVR on 04/23/17.  Her postop course was complicated by atrial fibrillation.  She was started on IV amiodarone and chemically cardioverted to sinus rhythm.  Since then, she was readmitted with acute renal failure.  This was in September 2018.  She required milrinone and went back into atrial fibrillation but did convert to sinus rhythm on amiodarone.  She has since stopped amiodarone due to tremors.  Today, denies symptoms of palpitations, chest pain, shortness of breath, orthopnea, PND, lower extremity edema, claudication, dizziness, presyncope, syncope, bleeding, or neurologic sequela. The patient is tolerating medications without difficulties.  Can use to have shortness of breath and fatigue.  Her heart rate has been as low as 40 in the past.  She says that it is difficult for her to walk long distances due to her shortness of breath.   Past Medical History:  Diagnosis Date  . Allergy   . Anxiety   . Arthritis   . Atrial enlargement, left   . CHF (congestive heart failure) ([place])   . Coronary artery disease   . Depression   . Diabetes mellitus without complication (HCC)      fasting cbg 110s  . Dyspnea   . GERD (gastroesophageal reflux disease)   . H/O hiatal hernia   . Headache(784.0)   . Heart murmur    06/10/2014 seeing new cardiologist  . Heart murmur   . HOH (hard of hearing)    HOH in left ear; needs to speak to patient in right ear  . Hyperlipidemia   . Hypertension   . Mitral regurgitation   . Mitral valve prolapse   . Palpitations    afib on 03/04/17 EKG  . PONV (postoperative nausea and vomiting)    Patient stated "I like the patch behind my ear"  . 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 S/P mitral valve replacement with bioprosthetic valve 04/23/2017   [facility] Mitral bovine pericardial tissue valve: Model 7300TFX, Serial [id number], size 31  . UTI (lower urinary tract infection)    Past Surgical History:  Procedure Laterality Date  . ABDOMINAL HYSTERECTOMY    . Anterior posterior and enterocele repairs  09/21/2004   With uterosacral cardinal colposuspension, partial colpocleisis; Selinda Orion, MD  . BACK SURGERY    . BREAST ENHANCEMENT SURGERY  02/25/2002   Bilateral reduction and excision of accessory breast tissue underneath the left breast; Aretha Parrot., MD  . BREAST SURGERY  11   reduction  . CARDIAC CATHETERIZATION     no PCI  . CARDIAC CATHETERIZATION  05/02/2017  . CATARACT EXTRACTION Bilateral   .  CLIPPING OF ATRIAL APPENDAGE  04/23/2017   Procedure: CLIPPING OF LEFT ATRIAL APPENDAGE;  Surgeon: [name], MD;  Location: [place];  Service: Open Heart Surgery;;  . COLONOSCOPY    . CORONARY ARTERY BYPASS GRAFT N/A 04/23/2017   Procedure: CORONARY ARTERY BYPASS GRAFTING (CABG), ON PUMP, TIMES ONE, USING ENDOSCOPICALLY HARVESTED RIGHT GREATER SAPHENOUS VEIN;  Surgeon: [name], MD;  Location: [place];  Service: Open Heart Surgery;  Laterality: N/A;  . EYE SURGERY Bilateral    cataract removal  . LUMBAR LAMINECTOMY/ DECOMPRESSION WITH MET-RX N/A 03/16/2014   Procedure: LUMBAR  FIVE-SACRAL ONE EXTRAFORAMINAL DISKECTOMY WITH METREX;  Surgeon: Kristeen Miss, MD;  Location: [place] [facility]UR[facility];  Service: Neurosurgery;  Laterality: N/A;  . LUMBAR LAMINECTOMY/DECOMPRESSION MICRODISCECTOMY Left 06/08/2014   Procedure: Left Lumbar Five-Sacral One Microdiskectomy;  Surgeon: Kristeen Miss, MD;  Location: [place] NEURO ORS;  Service: Neurosurgery;  Laterality: Left;  Left L5-S1 Microdiskectomy  . MITRAL VALVE REPLACEMENT N/A 04/23/2017   Procedure: MITRAL VALVE (MV) REPLACEMENT;  Surgeon: [name], MD;  Location: [place];  Service: Open Heart Surgery;  Laterality: N/A;  . PELVIC FLOOR REPAIR    . POSTERIOR LUMBAR FUSION 4 LEVEL N/A 08/08/2015   Procedure: T8-L1 posterior lateral fusion with decompression T12-L1;  Surgeon: Kristeen Miss, MD;  Location: [place] [facility]UR[facility];  Service: Neurosurgery;  Laterality: N/A;  T8-L1 posterior lateral fusion with decompression T12-L1  . RIGHT/LEFT HEART CATH AND CORONARY ANGIOGRAPHY N/A 03/28/2017   Procedure: Right/Left Heart Cath and Coronary Angiography;  Surgeon: Nelva Bush, MD;  Location: [place] CV LAB;  Service: Cardiovascular;  Laterality: N/A;  . ROTATOR CUFF REPAIR Bilateral    11.12  . TEE WITHOUT CARDIOVERSION N/A 03/04/2017   Procedure: TRANSESOPHAGEAL ECHOCARDIOGRAM (TEE);  Surgeon: Fay Records, MD;  Location: [place];  Service: Cardiovascular;  Laterality: N/A;  . TEE WITHOUT CARDIOVERSION N/A 04/23/2017   Procedure: TRANSESOPHAGEAL ECHOCARDIOGRAM (TEE);  Surgeon: [name], MD;  Location: [place];  Service: Open Heart Surgery;  Laterality: N/A;  . TONSILLECTOMY AND ADENOIDECTOMY       Current Outpatient Medications  Medication Sig Dispense Refill  . aspirin EC 81 MG EC tablet Take 1 tablet (81 mg total) by mouth daily.    . bisacodyl (DULCOLAX) 5 MG EC tablet Take 10 mg by mouth daily as needed (for constipation.).    [name] citalopram (CELEXA) 20 MG tablet Take 1 tablet (20 mg total) by mouth daily. 90 tablet 3  . conjugated  estrogens (PREMARIN) vaginal cream Place 1 Applicatorful vaginally daily. 42.5 g 12  . esomeprazole (NEXIUM) 20 MG capsule Take 20 mg by mouth daily at 12 noon.    . famotidine (PEPCID) 20 MG tablet Take 20 mg by mouth daily as needed for heartburn or indigestion.    . fexofenadine (ALLEGRA) 180 MG tablet Take 90 mg by mouth daily as needed for allergies or rhinitis (allergy headache).    . fluticasone (FLONASE) 50 MCG/ACT nasal spray SHAKE LIQUID AND USE 2 SPRAYS IN EACH NOSTRIL DAILY AS NEEDED FOR ALLERGIES 16 g 2  . furosemide (LASIX) 20 MG tablet Take 1 tablet (20 mg total) by mouth as needed for fluid or edema. 30 tablet 6  . Magnesium Oxide 200 MG TABS Take 1 tablet (200 mg total) by mouth 2 (two) times daily. 60 tablet 5  . metFORMIN (GLUCOPHAGE) 500 MG tablet Take 2 tablets (1,000 mg total) by mouth 2 (two) times daily with a meal. 360 tablet 3  .  metoprolol succinate (TOPROL-XL) 50 MG 24 hr tablet Take 1 tablet (50 mg total) daily by mouth. Take with or immediately following a meal. 30 tablet 3  . Multiple Vitamins-Minerals (ICAPS AREDS 2) CAPS Take 1 capsule by mouth 2 (two) times daily.    Glory Rosebush DELICA LANCETS 16L MISC USE TO TEST TWICE DAILY AS DIRECTED 100 each 0  . ONETOUCH VERIO test strip TEST BLOOD GLUCOSE TWICE DAILY 200 each 0  . potassium chloride (K-DUR) 10 MEQ tablet Take 1 tablet (10 mEq total) by mouth as needed (WHEN TAKING LASIX). 30 tablet 6  . sacubitril-valsartan (ENTRESTO) 24-26 MG Take 1 tablet by mouth 2 (two) times daily. 60 tablet 11  . trimethoprim (TRIMPEX) 100 MG tablet TAKE 1 TABLET(100 MG) BY MOUTH DAILY 90 tablet 0  . warfarin (COUMADIN) 2.5 MG tablet Take as directed by [facility] 150 tablet 0   No current facility-administered medications for this visit.     Allergies:   Cortisone; Statins; Codeine; Sulfonamide derivatives; Amiodarone; and Lactose intolerance (gi)   Social History:  The patient  reports that  has never smoked. she has never  used smokeless tobacco. She reports that she does not drink alcohol or use drugs.   Family History:  The patient's family history includes Arthritis in her father and mother; Heart disease in her father; Heart failure in her father; Hyperlipidemia in her father and mother; Hypertension in her mother.    ROS:  Please see the history of present illness.   Otherwise, review of systems is positive for shortness of breath, fatigue.   All other systems are reviewed and negative.   PHYSICAL EXAM: VS:  BP 120/62   Pulse (!) 46   Ht 5' 6.5" (1.689 m)   Wt 124 lb (56.2 kg)   BMI 19.71 kg/m  , BMI Body mass index is 19.71 kg/m. GEN: Well nourished, well developed, in no acute distress  HEENT: normal  Neck: no JVD, carotid bruits, or masses Cardiac: RRR; no murmurs, rubs, or gallops,no edema  Respiratory:  clear to auscultation bilaterally, normal work of breathing GI: soft, nontender, nondistended, + BS MS: no deformity or atrophy  Skin: warm and dry Neuro:  Strength and sensation are intact Psych: euthymic mood, full affect  EKG:  EKG is ordered today. Personal review of the ekg ordered shows junctional rhythm, septal infarct (old), rate 46   Recent Labs: 05/18/2017: B Natriuretic Peptide 3,509.1; TSH 1.716 05/28/2017: Hemoglobin 11.6; Platelets 375 06/07/2017: ALT 9 06/19/2017: BUN 15; Creatinine, Ser 1.00; Magnesium 1.4; NT-Pro BNP 489; Potassium 4.3; Sodium 135    Lipid Panel     Component Value Date/Time   CHOL 163 08/22/2016 0746   TRIG 101 08/22/2016 0746   HDL 65 08/22/2016 0746   CHOLHDL 2.5 08/22/2016 0746   VLDL 20 08/22/2016 0746   LDLCALC 78 08/22/2016 0746   LDLDIRECT 143.6 06/04/2012 0931     Wt Readings from Last 3 Encounters:  08/29/17 124 lb (56.2 kg)  07/30/17 121 lb 12.8 oz (55.2 kg)  07/10/17 118 lb 1.9 oz (53.6 kg)      Other studies Reviewed: Additional studies/ records that were reviewed today include: TTE 05/21/17  Review of the above records today  demonstrates:  - Left ventricle: The cavity size was normal. Wall thickness was   increased in a pattern of mild LVH. Systolic function was   moderately reduced. The estimated ejection fraction was in the   range of 35% to 40%. There is  akinesis of the apical myocardium. - Aortic valve: There was trivial regurgitation. - Mitral valve: A bioprosthesis was present. - Left atrium: The atrium was severely dilated. - Pulmonary arteries: PA peak pressure: 34 mm Hg (S).  Cath 03/28/17 1. Severe single-vessel coronary artery disease with 60% proximal and 99% distal RCA stenoses as well as chronic total occlusion of the ostial rPDA. The rPDA andr PL branches are supplied by left-to-right collaterals. 2. 70% proximal D2 stenosis is evident. Otherwise, there is mild, nonobstructive disease involving the LAD, LCx, and their branches. 3. Normal left and right heart filling pressures. Normal pulmonary artery pressure. Normal Fick cardiac output/index.  ASSESSMENT AND PLAN:  1.  Chronic systolic heart failure: Ejection fraction improved to 35-40%.  Currently on optimal therapy.  No changes at this time.  2.  Paroxysmal atrial fibrillation: On warfarin.  Has not tolerated amiodarone in the past.  No changes.  This patients CHA2DS2-VASc Score and unadjusted Ischemic Stroke Rate (% per year) is equal to 7.2 % stroke rate/year from a score of 5  Above score calculated as 1 point each if present [CHF, HTN, DM, Vascular=MI/PAD/Aortic Plaque, Age if 65-74, or Female] Above score calculated as 2 points each if present [Age > 75, or Stroke/TIA/TE]  3.  Coronary artery disease: That is post CABG x1.  No current chest pain.  4.  Mitral valve regurgitation: Status post bioprosthetic mitral valve and left atrial appendage clipping.  Stable by most recent echo.  5.  Junctional rhythm: Remains in junctional rhythm after starting metoprolol.  We [name] plan for pacemaker insertion.  Risks and benefits include bleeding,  tamponade, infection, pneumothorax among others.  She understands these risks and is agreed to the procedure.  Current medicines are reviewed at length with the patient today.   The patient does not have concerns regarding her medicines.  The following changes were made today: none  Labs/ tests ordered today include:  Orders Placed This Encounter  Procedures  . Basic Metabolic Panel (BMET)  . CBC w/Diff  . EKG 12-Lead     Disposition:   FU with [name] 3 month  Signed, Lori Contino Meredith Leeds, MD  08/29/2017 11:03 AM     [facility] [address] [place] [phone] (office) [phone] (fax)

## 2017-08-29 NOTE — Patient Instructions (Signed)
Description   Take 2 tablets today, then start taking 1.5 tablets daily except 1 tablet on Mondays. Recheck INR in 2 weeks.

## 2017-09-05 ENCOUNTER — Telehealth (HOSPITAL_COMMUNITY): Payer: Self-pay | Admitting: Pharmacist

## 2017-09-05 NOTE — Telephone Encounter (Signed)
Mr. Silverthorne called stating that they received a message from [employer] patient assistance that they had not received the re-enrollment form for [facility] patient assistance. Mrs. Foglesong will not be eligible for that program until closer to the end of next year since she needs to have spent 10% of her annual income on her medications in 2019. She will be eligible for PANF the first of the year so I will enroll her at that time. Attempted to call back and no VM.   [name]. Velva Harman, PharmD, BCPS, CPP Clinical Pharmacist Pager: [phone] Phone: [phone] 09/05/2017 11:55 AM

## 2017-09-12 ENCOUNTER — Other Ambulatory Visit: Payer: Self-pay | Admitting: Family Medicine

## 2017-09-12 ENCOUNTER — Telehealth: Payer: Self-pay | Admitting: Cardiology

## 2017-09-12 NOTE — Telephone Encounter (Signed)
Pt states she has lost her original letter of  Instructions given to her.  Informed patient not to worry.  Letter of  Instructions printed and left at front desk to pick up tomorrow when she stops by for pre procedure blood work. Pt is appreciative and agreeable to plan.

## 2017-09-12 NOTE — Telephone Encounter (Signed)
Pt medication sent to pharmacy electronically.

## 2017-09-12 NOTE — Telephone Encounter (Signed)
New Message     Patient states she lost her pre-surgery instructions , could you please call her with that information

## 2017-09-12 NOTE — Telephone Encounter (Signed)
Refill for 6 months. 

## 2017-09-13 ENCOUNTER — Other Ambulatory Visit: Payer: Medicare Other | Admitting: *Deleted

## 2017-09-13 ENCOUNTER — Telehealth (HOSPITAL_COMMUNITY): Payer: Self-pay | Admitting: Pharmacist

## 2017-09-13 ENCOUNTER — Ambulatory Visit (INDEPENDENT_AMBULATORY_CARE_PROVIDER_SITE_OTHER): Payer: Medicare Other | Admitting: *Deleted

## 2017-09-13 DIAGNOSIS — I498 Other specified cardiac arrhythmias: Secondary | ICD-10-CM | POA: Diagnosis not present

## 2017-09-13 DIAGNOSIS — I4819 Other persistent atrial fibrillation: Secondary | ICD-10-CM

## 2017-09-13 DIAGNOSIS — Z7901 Long term (current) use of anticoagulants: Secondary | ICD-10-CM | POA: Diagnosis not present

## 2017-09-13 DIAGNOSIS — I4891 Unspecified atrial fibrillation: Secondary | ICD-10-CM

## 2017-09-13 DIAGNOSIS — Z953 Presence of xenogenic heart valve: Secondary | ICD-10-CM

## 2017-09-13 DIAGNOSIS — I481 Persistent atrial fibrillation: Secondary | ICD-10-CM | POA: Diagnosis not present

## 2017-09-13 DIAGNOSIS — I059 Rheumatic mitral valve disease, unspecified: Secondary | ICD-10-CM

## 2017-09-13 DIAGNOSIS — Z01812 Encounter for preprocedural laboratory examination: Secondary | ICD-10-CM

## 2017-09-13 LAB — POCT INR: INR: 1.7

## 2017-09-13 NOTE — Patient Instructions (Signed)
Description   In preparation for your procedure on Monday continue taking 1.5 tablets daily except 1 tablet on Mondays.  Recheck INR in 1 week.

## 2017-09-13 NOTE — Telephone Encounter (Signed)
Attempted to call [name] to see if she wanted me to enroll her in PANF to assist with [facility] copay costs but no answer and no VM. If she is interested in [facility], will need her SSN and SS income information.   [name]. Velva Harman, PharmD, BCPS, CPP Clinical Pharmacist Pager: [phone] Phone: [phone] 09/13/2017 4:55 PM

## 2017-09-14 LAB — CBC WITH DIFFERENTIAL/PLATELET
BASOS ABS: 0.1 10*3/uL (ref 0.0–0.2)
Basos: 1 %
EOS (ABSOLUTE): 0.4 10*3/uL (ref 0.0–0.4)
EOS: 4 %
HEMATOCRIT: 34.9 % (ref 34.0–46.6)
Hemoglobin: 11.1 g/dL (ref 11.1–15.9)
IMMATURE GRANULOCYTES: 1 %
Immature Grans (Abs): 0.1 10*3/uL (ref 0.0–0.1)
Lymphocytes Absolute: 2.4 10*3/uL (ref 0.7–3.1)
Lymphs: 27 %
MCH: 29.8 pg (ref 26.6–33.0)
MCHC: 31.8 g/dL (ref 31.5–35.7)
MCV: 94 fL (ref 79–97)
MONOS ABS: 0.6 10*3/uL (ref 0.1–0.9)
Monocytes: 7 %
NEUTROS PCT: 60 %
Neutrophils Absolute: 5.4 10*3/uL (ref 1.4–7.0)
PLATELETS: 268 10*3/uL (ref 150–379)
RBC: 3.72 x10E6/uL — AB (ref 3.77–5.28)
RDW: 15.2 % (ref 12.3–15.4)
WBC: 8.9 10*3/uL (ref 3.4–10.8)

## 2017-09-14 LAB — BASIC METABOLIC PANEL
BUN / CREAT RATIO: 20 (ref 12–28)
BUN: 19 mg/dL (ref 8–27)
CALCIUM: 9.5 mg/dL (ref 8.7–10.3)
CHLORIDE: 100 mmol/L (ref 96–106)
CO2: 18 mmol/L — ABNORMAL LOW (ref 20–29)
Creatinine, Ser: 0.97 mg/dL (ref 0.57–1.00)
GFR, EST AFRICAN AMERICAN: 63 mL/min/{1.73_m2} (ref >59)
GFR, EST NON AFRICAN AMERICAN: 55 mL/min/{1.73_m2} — AB (ref >59)
Glucose: 177 mg/dL — ABNORMAL HIGH (ref 65–99)
POTASSIUM: 5 mmol/L (ref 3.5–5.2)
SODIUM: 135 mmol/L (ref 134–144)

## 2017-09-16 ENCOUNTER — Encounter (HOSPITAL_COMMUNITY)
Admission: RE | Disposition: A | Discharge status: Home / Self Care | Payer: Self-pay | Source: Ambulatory Visit | Attending: Cardiology

## 2017-09-16 ENCOUNTER — Encounter (HOSPITAL_COMMUNITY): Payer: Self-pay | Admitting: Cardiology

## 2017-09-16 ENCOUNTER — Ambulatory Visit (HOSPITAL_COMMUNITY)
Admission: RE | Admit: 2017-09-16 | Discharge: 2017-09-16 | Disposition: A | Discharge status: Home / Self Care | Payer: Medicare Other | Source: Ambulatory Visit | Attending: Cardiology | Admitting: Cardiology

## 2017-09-16 ENCOUNTER — Telehealth: Payer: Self-pay | Admitting: *Deleted

## 2017-09-16 ENCOUNTER — Other Ambulatory Visit (HOSPITAL_COMMUNITY): Payer: Self-pay | Admitting: Pharmacist

## 2017-09-16 ENCOUNTER — Telehealth (HOSPITAL_COMMUNITY): Payer: Self-pay | Admitting: Pharmacist

## 2017-09-16 DIAGNOSIS — Z79899 Other long term (current) drug therapy: Secondary | ICD-10-CM | POA: Insufficient documentation

## 2017-09-16 DIAGNOSIS — Z7982 Long term (current) use of aspirin: Secondary | ICD-10-CM | POA: Diagnosis not present

## 2017-09-16 DIAGNOSIS — Z7901 Long term (current) use of anticoagulants: Secondary | ICD-10-CM | POA: Diagnosis not present

## 2017-09-16 DIAGNOSIS — E119 Type 2 diabetes mellitus without complications: Secondary | ICD-10-CM | POA: Diagnosis not present

## 2017-09-16 DIAGNOSIS — I110 Hypertensive heart disease with heart failure: Secondary | ICD-10-CM | POA: Diagnosis not present

## 2017-09-16 DIAGNOSIS — Z79890 Hormone replacement therapy (postmenopausal): Secondary | ICD-10-CM | POA: Diagnosis not present

## 2017-09-16 DIAGNOSIS — F419 Anxiety disorder, unspecified: Secondary | ICD-10-CM | POA: Diagnosis not present

## 2017-09-16 DIAGNOSIS — I5022 Chronic systolic (congestive) heart failure: Secondary | ICD-10-CM | POA: Insufficient documentation

## 2017-09-16 DIAGNOSIS — I480 Paroxysmal atrial fibrillation: Secondary | ICD-10-CM | POA: Diagnosis not present

## 2017-09-16 DIAGNOSIS — I2510 Atherosclerotic heart disease of native coronary artery without angina pectoris: Secondary | ICD-10-CM | POA: Insufficient documentation

## 2017-09-16 DIAGNOSIS — Z8249 Family history of ischemic heart disease and other diseases of the circulatory system: Secondary | ICD-10-CM | POA: Diagnosis not present

## 2017-09-16 DIAGNOSIS — Z7951 Long term (current) use of inhaled steroids: Secondary | ICD-10-CM | POA: Insufficient documentation

## 2017-09-16 DIAGNOSIS — K219 Gastro-esophageal reflux disease without esophagitis: Secondary | ICD-10-CM | POA: Diagnosis not present

## 2017-09-16 DIAGNOSIS — Z952 Presence of prosthetic heart valve: Secondary | ICD-10-CM | POA: Diagnosis not present

## 2017-09-16 DIAGNOSIS — Z951 Presence of aortocoronary bypass graft: Secondary | ICD-10-CM | POA: Insufficient documentation

## 2017-09-16 DIAGNOSIS — Z538 Procedure and treatment not carried out for other reasons: Secondary | ICD-10-CM | POA: Diagnosis not present

## 2017-09-16 DIAGNOSIS — R001 Bradycardia, unspecified: Secondary | ICD-10-CM | POA: Insufficient documentation

## 2017-09-16 DIAGNOSIS — Z7984 Long term (current) use of oral hypoglycemic drugs: Secondary | ICD-10-CM | POA: Diagnosis not present

## 2017-09-16 DIAGNOSIS — F329 Major depressive disorder, single episode, unspecified: Secondary | ICD-10-CM | POA: Diagnosis not present

## 2017-09-16 HISTORY — PX: PACEMAKER IMPLANT: EP1218

## 2017-09-16 LAB — PROTIME-INR
INR: 1.41
Prothrombin Time: 17.1 seconds — ABNORMAL HIGH (ref 11.4–15.2)

## 2017-09-16 LAB — SURGICAL PCR SCREEN
MRSA, PCR: NEGATIVE
STAPHYLOCOCCUS AUREUS: NEGATIVE

## 2017-09-16 LAB — GLUCOSE, CAPILLARY: Glucose-Capillary: 109 mg/dL — ABNORMAL HIGH (ref 65–99)

## 2017-09-16 SURGERY — PACEMAKER IMPLANT
Anesthesia: LOCAL

## 2017-09-16 MED ORDER — LIDOCAINE HCL (PF) 1 % IJ SOLN
INTRAMUSCULAR | Status: AC
  Filled 2017-09-16: qty 60

## 2017-09-16 MED ORDER — SODIUM CHLORIDE 0.9 % IR SOLN: 80.0000 mg | Status: DC

## 2017-09-16 MED ORDER — CEFAZOLIN SODIUM-DEXTROSE 2-4 GM/100ML-% IV SOLN
INTRAVENOUS | Status: AC
  Filled 2017-09-16: qty 100

## 2017-09-16 MED ORDER — SODIUM CHLORIDE 0.9 % IR SOLN
Status: AC
  Filled 2017-09-16: qty 2

## 2017-09-16 MED ORDER — CEFAZOLIN SODIUM-DEXTROSE 2-4 GM/100ML-% IV SOLN
2.0000 g | INTRAVENOUS | Status: AC
  Administered 2017-09-16: 2 g via INTRAVENOUS

## 2017-09-16 MED ORDER — MUPIROCIN 2 % EX OINT
TOPICAL_OINTMENT | CUTANEOUS | Status: AC
  Administered 2017-09-16: 1
  Filled 2017-09-16: qty 22

## 2017-09-16 MED ORDER — SODIUM CHLORIDE 0.9 % IV SOLN
INTRAVENOUS | Status: DC
  Administered 2017-09-16: 06:00:00 via INTRAVENOUS

## 2017-09-16 MED ORDER — SACUBITRIL-VALSARTAN 24-26 MG PO TABS: 1.0000 | ORAL_TABLET | Freq: Two times a day (BID) | ORAL | 3 refills | Status: DC

## 2017-09-16 MED ORDER — MIDAZOLAM HCL 5 MG/5ML IJ SOLN
INTRAMUSCULAR | Status: AC
  Filled 2017-09-16: qty 5

## 2017-09-16 MED ORDER — FENTANYL CITRATE (PF) 100 MCG/2ML IJ SOLN
INTRAMUSCULAR | Status: AC
Start: 2017-09-16 — End: 2017-09-16
  Filled 2017-09-16: qty 2

## 2017-09-16 MED ORDER — HEPARIN (PORCINE) IN NACL 2-0.9 UNIT/ML-% IJ SOLN
INTRAMUSCULAR | Status: AC
  Filled 2017-09-16: qty 500

## 2017-09-16 MED ORDER — MIDAZOLAM HCL 5 MG/5ML IJ SOLN
INTRAMUSCULAR | Status: DC | PRN
  Administered 2017-09-16: 1 mg via INTRAVENOUS

## 2017-09-16 SURGICAL SUPPLY — 4 items
CABLE SURGICAL S-101-97-12 (CABLE) ×2 IMPLANT
PAD DEFIB LIFELINK (PAD) ×2 IMPLANT
SHEATH CLASSIC 7F (SHEATH) ×4 IMPLANT
TRAY PACEMAKER INSERTION (PACKS) ×2 IMPLANT

## 2017-09-16 NOTE — Progress Notes (Addendum)
[name] presented to the hospital for pacemaker implant. She has had junctional bradycardia since her MVR and 1 vessel CABG. She presented to the hospital with sinus rhythm and a possible UTI. She is continuing to have symptoms of fatigue and SOB despite sinus rhythm in the 60s. Due to her being in sinus rhythm, [name] plan to cancel pacemaker implant due to sinus rhythm and possible UTI. [name] place a 30 day monitor to further evaluate her rhythm and assess the need for a pacemaker implant.  [name] Curt Bears, MD 09/16/2017 8:50 AM

## 2017-09-16 NOTE — Telephone Encounter (Addendum)
-----   Message from [facility] sent at 09/16/2017 10:51 AM EST ----- Good morning guys!  Melissa, can you please call this patient and have her scheduled for a 30 day monitor and 6-8 week follow up with Dr. Curt Bears?  Sheri, she need refills on her Toprol, Would you mind sending in?  Thanks [employer]

## 2017-09-16 NOTE — H&P (Signed)
[name] has presented today for surgery, with the diagnosis of junctional bradycardia.  The various methods of treatment have been discussed with the patient and family. After consideration of risks, benefits and other options for treatment, the patient has consented to  Procedure(s): pacemaker as a surgical intervention .  Risks include but not limited to bleeding, tamponade, infection, pneumothorax, among others. The patient's history has been reviewed, patient examined, no change in status, stable for surgery.  I have reviewed the patient's chart and labs.  Questions were answered to the patient's satisfaction.    Sharalee Witman Curt Bears, MD 09/16/2017 7:06 AM

## 2017-09-16 NOTE — Telephone Encounter (Signed)
Enrolled in PANF so that Mrs. Brissette will have $1000 to use toward her Delene Loll copay costs through 09/15/18. Relayed info to [employer] on [address].   Member ID: [id number] Group ID: [id number] RxBin ID: [id number] PCN: [id number] Eligibility Start Date: 06/18/2017 Eligibility End Date: 09/15/2018 Assistance Amount: $1,000.00   Doroteo Bradford K. Velva Harman, PharmD, BCPS, CPP Clinical Pharmacist Phone: [phone] 09/16/2017 11:24 AM

## 2017-09-17 ENCOUNTER — Encounter: Payer: Self-pay | Admitting: Family Medicine

## 2017-09-17 ENCOUNTER — Ambulatory Visit (INDEPENDENT_AMBULATORY_CARE_PROVIDER_SITE_OTHER): Payer: Medicare Other | Admitting: Family Medicine

## 2017-09-17 VITALS — BP 102/52 | HR 69 | Temp 97.5°F | Ht 66.5 in | Wt 125.8 lb

## 2017-09-17 DIAGNOSIS — R300 Dysuria: Secondary | ICD-10-CM

## 2017-09-17 LAB — POC URINALSYSI DIPSTICK (AUTOMATED)
BILIRUBIN UA: NEGATIVE
Blood, UA: NEGATIVE
GLUCOSE UA: NEGATIVE
KETONES UA: NEGATIVE
Nitrite, UA: NEGATIVE
Protein, UA: NEGATIVE
SPEC GRAV UA: 1.025 (ref 1.010–1.025)
UROBILINOGEN UA: 0.2 U/dL
pH, UA: 6 (ref 5.0–8.0)

## 2017-09-17 MED ORDER — METOPROLOL SUCCINATE ER 25 MG PO TB24: 25.0000 mg | ORAL_TABLET | Freq: Every evening | ORAL | 2 refills | Status: DC

## 2017-09-17 MED ORDER — CEFIXIME 400 MG PO CAPS: 400.0000 mg | ORAL_CAPSULE | Freq: Every day | ORAL | 0 refills | Status: DC

## 2017-09-17 MED FILL — Lidocaine HCl Local Preservative Free (PF) Inj 1%: INTRAMUSCULAR | Qty: 30 | Status: AC

## 2017-09-17 MED FILL — Gentamicin Sulfate Inj 40 MG/ML: INTRAMUSCULAR | Qty: 80 | Status: AC

## 2017-09-17 MED FILL — Cefazolin Sodium-Dextrose IV Solution 2 GM/100ML-4%: INTRAVENOUS | Qty: 100 | Status: AC

## 2017-09-17 MED FILL — Fentanyl Citrate Preservative Free (PF) Inj 100 MCG/2ML: INTRAMUSCULAR | Qty: 2 | Status: AC

## 2017-09-17 MED FILL — Heparin Sodium (Porcine) 2 Unit/ML in Sodium Chloride 0.9%: INTRAMUSCULAR | Qty: 500 | Status: AC

## 2017-09-17 NOTE — Telephone Encounter (Signed)
Refill sent in.  Pt asking about a monitor and if she still had to have it (she prefers not to, but will do whatever MD advises)  (if she does need she would like to make on same day as Coumadin check)  Pt understands I will call her once reviewed w/ Dr. Curt Bears. She thanks me for calling.

## 2017-09-17 NOTE — Progress Notes (Signed)
HPI:  Acute visit for Dysuria: -started 3 days ago -hx UTI w/ resistant org, last culture reviewed -symptoms: frequency, urgency, dysuria -denies: fevers, malaise, abd pain, flank pain, hematuria, NVD, vaginal symptoms  ROS: See pertinent positives and negatives per HPI.  Past Medical History:  Diagnosis Date  . Allergy   . Anxiety   . Arthritis   . Atrial enlargement, left   . CHF (congestive heart failure) ([place])   . Coronary artery disease   . Depression   . Diabetes mellitus without complication (HCC)    fasting cbg 110s  . Dyspnea   . GERD (gastroesophageal reflux disease)   . H/O hiatal hernia   . Headache(784.0)   . Heart murmur    06/10/2014 seeing new cardiologist  . Heart murmur   . HOH (hard of hearing)    HOH in left ear; needs to speak to patient in right ear  . Hyperlipidemia   . Hypertension   . Mitral regurgitation   . Mitral valve prolapse   . Palpitations    afib on 03/04/17 EKG  . PONV (postoperative nausea and vomiting)    Patient stated "I like the patch behind my ear"  . 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 S/P mitral valve replacement with bioprosthetic valve 04/23/2017   [facility] Mitral bovine pericardial tissue valve: Model 7300TFX, Serial [id number], size 31  . UTI (lower urinary tract infection)     Past Surgical History:  Procedure Laterality Date  . ABDOMINAL HYSTERECTOMY    . Anterior posterior and enterocele repairs  09/21/2004   With uterosacral cardinal colposuspension, partial colpocleisis; Selinda Orion, MD  . BACK SURGERY    . BREAST ENHANCEMENT SURGERY  02/25/2002   Bilateral reduction and excision of accessory breast tissue underneath the left breast; Aretha Parrot., MD  . BREAST SURGERY  11   reduction  . CARDIAC CATHETERIZATION     no PCI  . CARDIAC CATHETERIZATION  05/02/2017  . CATARACT EXTRACTION Bilateral   . CLIPPING OF ATRIAL APPENDAGE  04/23/2017   Procedure: CLIPPING OF  LEFT ATRIAL APPENDAGE;  Surgeon: [name], MD;  Location: [place];  Service: Open Heart Surgery;;  . COLONOSCOPY    . CORONARY ARTERY BYPASS GRAFT N/A 04/23/2017   Procedure: CORONARY ARTERY BYPASS GRAFTING (CABG), ON PUMP, TIMES ONE, USING ENDOSCOPICALLY HARVESTED RIGHT GREATER SAPHENOUS VEIN;  Surgeon: [name], MD;  Location: [place];  Service: Open Heart Surgery;  Laterality: N/A;  . EYE SURGERY Bilateral    cataract removal  . LUMBAR LAMINECTOMY/ DECOMPRESSION WITH MET-RX N/A 03/16/2014   Procedure: LUMBAR FIVE-SACRAL ONE EXTRAFORAMINAL DISKECTOMY WITH METREX;  Surgeon: Kristeen Miss, MD;  Location: [place] [facility];  Service: Neurosurgery;  Laterality: N/A;  . LUMBAR LAMINECTOMY/DECOMPRESSION MICRODISCECTOMY Left 06/08/2014   Procedure: Left Lumbar Five-Sacral One Microdiskectomy;  Surgeon: Kristeen Miss, MD;  Location: [place] [facility];  Service: Neurosurgery;  Laterality: Left;  Left L5-S1 Microdiskectomy  . MITRAL VALVE REPLACEMENT N/A 04/23/2017   Procedure: MITRAL VALVE (MV) REPLACEMENT;  Surgeon: [name], MD;  Location: [place];  Service: Open Heart Surgery;  Laterality: N/A;  . PACEMAKER IMPLANT N/A 09/16/2017   Procedure: PACEMAKER IMPLANT;  Surgeon: Constance Haw, MD;  Location: [place] [facility];  Service: Cardiovascular;  Laterality: N/A;  . PELVIC FLOOR REPAIR    . POSTERIOR LUMBAR FUSION 4 LEVEL N/A 08/08/2015   Procedure: T8-L1 posterior lateral  fusion with decompression T12-L1;  Surgeon: Kristeen Miss, MD;  Location: [place] [facility];  Service: Neurosurgery;  Laterality: N/A;  T8-L1 posterior lateral fusion with decompression T12-L1  . RIGHT/LEFT HEART CATH AND CORONARY ANGIOGRAPHY N/A 03/28/2017   Procedure: Right/Left Heart Cath and Coronary Angiography;  Surgeon: Nelva Bush, MD;  Location: [place] [facility];  Service: Cardiovascular;  Laterality: N/A;  . ROTATOR CUFF REPAIR Bilateral    11.12  . TEE WITHOUT CARDIOVERSION N/A 03/04/2017   Procedure:  TRANSESOPHAGEAL ECHOCARDIOGRAM (TEE);  Surgeon: Fay Records, MD;  Location: [place];  Service: Cardiovascular;  Laterality: N/A;  . TEE WITHOUT CARDIOVERSION N/A 04/23/2017   Procedure: TRANSESOPHAGEAL ECHOCARDIOGRAM (TEE);  Surgeon: [name], MD;  Location: [place];  Service: Open Heart Surgery;  Laterality: N/A;  . TONSILLECTOMY AND ADENOIDECTOMY      Family History  Problem Relation Age of Onset  . Heart failure Father   . Arthritis Father   . Hyperlipidemia Father   . Heart disease Father   . Arthritis Mother   . Hyperlipidemia Mother   . Hypertension Mother     Social History   Socioeconomic History  . Marital status: Married    Spouse name: None  . Number of children: None  . Years of education: None  . Highest education level: None  Social Needs  . Financial resource strain: None  . Food insecurity - worry: None  . Food insecurity - inability: None  . Transportation needs - medical: None  . Transportation needs - non-medical: None  Occupational History  . Occupation: Retired    Fish farm manager: RETIRED  Tobacco Use  . Smoking status: Never Smoker  . Smokeless tobacco: Never Used  Substance and Sexual Activity  . Alcohol use: No  . Drug use: No  . Sexual activity: None  Other Topics Concern  . None  Social History Narrative   Married   Regular exercise     Current Outpatient Medications:  .  aspirin EC 81 MG EC tablet, Take 1 tablet (81 mg total) by mouth daily., Disp: , Rfl:  .  bisacodyl (DULCOLAX) 5 MG EC tablet, Take 10 mg by mouth daily as needed (for constipation.)., Disp: , Rfl:  .  citalopram (CELEXA) 20 MG tablet, Take 1 tablet (20 mg total) by mouth daily. (Patient taking differently: Take 20 mg by mouth daily with supper. ), Disp: 90 tablet, Rfl: 3 .  conjugated estrogens (PREMARIN) vaginal cream, Place 1 Applicatorful vaginally daily. (Patient taking differently: Place 1 Applicatorful vaginally daily as needed (FOR VAGINAL DRYNESS.). ),  Disp: 42.5 g, Rfl: 12 .  esomeprazole (NEXIUM) 20 MG capsule, Take 20 mg by mouth daily at 12 noon., Disp: , Rfl:  .  famotidine (PEPCID) 20 MG tablet, Take 20 mg by mouth daily. , Disp: , Rfl:  .  fexofenadine (ALLEGRA) 180 MG tablet, Take 90 mg by mouth daily as needed for allergies or rhinitis (allergy headache)., Disp: , Rfl:  .  fluticasone (FLONASE) 50 MCG/ACT nasal spray, SHAKE LIQUID AND USE 2 SPRAYS IN EACH NOSTRIL DAILY AS NEEDED FOR ALLERGIES, Disp: 16 g, Rfl: 2 .  furosemide (LASIX) 20 MG tablet, Take 1 tablet (20 mg total) by mouth as needed for fluid or edema. (Patient taking differently: Take 20 mg by mouth daily as needed for fluid or edema. ), Disp: 30 tablet, Rfl: 6 .  metFORMIN (GLUCOPHAGE) 500 MG tablet, Take 2 tablets (1,000 mg total) by mouth 2 (two) times daily with a meal.,  Disp: 360 tablet, Rfl: 3 .  metoprolol succinate (TOPROL-XL) 25 MG 24 hr tablet, Take 25 mg by mouth daily., Disp: , Rfl:  .  Multiple Vitamins-Minerals (ICAPS AREDS 2) CAPS, Take 1 capsule by mouth 2 (two) times daily., Disp: , Rfl:  .  ONETOUCH DELICA LANCETS 93T MISC, USE TO TEST TWICE DAILY AS DIRECTED (Patient taking differently: USE TO TEST ONCE DAILY AS DIRECTED), Disp: 100 each, Rfl: 0 .  ONETOUCH VERIO test strip, TEST BLOOD GLUCOSE TWICE DAILY (Patient taking differently: TEST BLOOD GLUCOSE ONCE DAILY), Disp: 200 each, Rfl: 0 .  potassium chloride (K-DUR) 10 MEQ tablet, Take 1 tablet (10 mEq total) by mouth as needed (WHEN TAKING LASIX)., Disp: 30 tablet, Rfl: 6 .  sacubitril-valsartan (ENTRESTO) 24-26 MG, Take 1 tablet by mouth 2 (two) times daily., Disp: 90 tablet, Rfl: 3 .  trimethoprim (TRIMPEX) 100 MG tablet, TAKE 1 TABLET(100 MG) BY MOUTH DAILY, Disp: 90 tablet, Rfl: 0 .  warfarin (COUMADIN) 2.5 MG tablet, Take as directed by [facility] (Patient taking differently: Take 2.5-3.75 mg by mouth See admin instructions. TAKE 1.5 TABLETS (3.75 MG) DAILY, EXCEPT ON MONDAYS TAKE 1 TABLET (2.5  MG)), Disp: 150 tablet, Rfl: 0 .  Cefixime (SUPRAX) 400 MG CAPS capsule, Take 1 capsule (400 mg total) by mouth daily., Disp: 5 capsule, Rfl: 0  EXAM:  Vitals:   09/17/17 1021  BP: (!) 102/52  Pulse: 69  Temp: (!) 97.5 F (36.4 C)    Body mass index is 20 kg/m.  GENERAL: vitals reviewed and listed above, alert, oriented, appears well hydrated and in no acute distress  HEENT: atraumatic, conjunttiva clear, no obvious abnormalities on inspection of external nose and ears  NECK: no obvious masses on inspection  LUNGS: clear to auscultation bilaterally, no wheezes, rales or rhonchi, good air movement  CV: HRRR  ABD: soft, NTTP, no CVA TTP  MS: moves all extremities without noticeable abnormality  PSYCH: pleasant and cooperative, no obvious depression or anxiety  ASSESSMENT AND PLAN:  Discussed the following assessment and plan:  Dysuria - Plan: POCT Urinalysis Dipstick (Automated), Culture, Urine  -smx + UA findings + hx suggest possible UTI -pt opted for empiric abx while culture pending, opted for suprax given prior hx, education on medication risks provided -Patient advised to return or notify a doctor immediately if symptoms worsen or persist or new concerns arise.  Patient Instructions  Sent antibiotic to the pharmacy.  Please ensure your cardiologist or individual managing your coumadin is aware you are taking an antibiotic.  I hope you are feeling better soon! Seek care promptly if your symptoms worsen, new concerns arise or you are not improving with treatment.  Cefixime oral tablets or capsules What is this medicine? CEFIXIME (sef IX eem) is a cephalosporin antibiotic. It is used to treat certain kinds of bacterial infections. It will not work for colds, flu, or other viral infections. This medicine may be used for other purposes; ask your health care provider or pharmacist if you have questions. COMMON BRAND NAME(S): Suprax What should I tell my health  care provider before I take this medicine? They need to know if you have any of these conditions: -bleeding problems -kidney disease -stomach or intestine problems (especially colitis) -an unusual or allergic reaction to cefixime, other cephalosporin or penicillin antibiotics, other foods, dyes or preservatives -pregnant or trying to get pregnant -breast-feeding How should I use this medicine? Take this medicine by mouth with a glass of water. Follow the  directions on the prescription label. You can take it with or without food. If it upsets your stomach, take it with food. Take your medicine at regular intervals. Do not take it more often than directed. Take all of your medicine as directed even if you think your are better. Do not skip doses or stop your medicine early. Talk to your pediatrician regarding the use of this medicine in children. While this drug may be prescribed for children as young as 6 months for selected conditions, precautions do apply. Overdosage: If you think you have taken too much of this medicine contact a poison control center or emergency room at once. NOTE: This medicine is only for you. Do not share this medicine with others. What if I miss a dose? If you miss a dose, take it as soon as you can. If it is almost time for your next dose, take only that dose. Do not take double or extra doses. What may interact with this medicine? -aspirin and aspirin-like medicines -carbamazepine -medicines that treat or prevent blood clots like warfarin This list may not describe all possible interactions. Give your health care provider a list of all the medicines, herbs, non-prescription drugs, or dietary supplements you use. Also tell them if you smoke, drink alcohol, or use illegal drugs. Some items may interact with your medicine. What should I watch for while using this medicine? Tell your doctor or health care professional if your symptoms do not improve or if you get new  symptoms. Your doctor will monitor your condition and blood work as needed. Do not treat diarrhea with over the counter products. Contact your doctor if you have diarrhea that lasts more than 2 days or if it is severe and watery. This medicine can interfere with some urine glucose and some urine ketone tests. If you use such tests, talk with your health care professional. If you are being treated for a sexually transmitted disease, avoid sexual contact until you have finished your treatment. Having sex can infect your sexual partner. What side effects may I notice from receiving this medicine? Side effects that you should report to your doctor or health care professional as soon as possible: -allergic reactions like skin rash, itching or hives, swelling of the face, lips, or tongue -bloody or watery diarrhea -difficulty breathing or wheezing -dizziness -fever -pain or trouble passing urine or change in the amount of urine -redness, blistering, peeling or loosening of the skin, including inside the mouth -seizures -unusual bleeding or bruising -unusually weak or tired -yellowing of the eyes or skin Side effects that usually do not require medical attention (report to your doctor or health care professional if they continue or are bothersome): -diarrhea -headache -genital or anal irritation -loss of appetite -nausea, vomiting -stomach pain, upset, or gas This list may not describe all possible side effects. Call your doctor for medical advice about side effects. You may report side effects to FDA at 1-800-FDA-1088. Where should I keep my medicine? Keep out of the reach of children. Store at room temperature between 20 and 25 degrees C (68 and 77 degrees F). Throw away any unused medicine after the expiration date. NOTE: This sheet is a summary. It may not cover all possible information. If you have questions about this medicine, talk to your doctor, pharmacist, or health care provider.   2018 Elsevier/Gold Standard (2007-12-15 16:31:02)    Colin Benton R., DO

## 2017-09-17 NOTE — Patient Instructions (Signed)
Sent antibiotic to the pharmacy.  Please ensure your cardiologist or individual managing your coumadin is aware you are taking an antibiotic.  I hope you are feeling better soon! Seek care promptly if your symptoms worsen, new concerns arise or you are not improving with treatment.  Cefixime oral tablets or capsules What is this medicine? CEFIXIME (sef IX eem) is a cephalosporin antibiotic. It is used to treat certain kinds of bacterial infections. It will not work for colds, flu, or other viral infections. This medicine may be used for other purposes; ask your health care provider or pharmacist if you have questions. COMMON BRAND NAME(S): Suprax What should I tell my health care provider before I take this medicine? They need to know if you have any of these conditions: -bleeding problems -kidney disease -stomach or intestine problems (especially colitis) -an unusual or allergic reaction to cefixime, other cephalosporin or penicillin antibiotics, other foods, dyes or preservatives -pregnant or trying to get pregnant -breast-feeding How should I use this medicine? Take this medicine by mouth with a glass of water. Follow the directions on the prescription label. You can take it with or without food. If it upsets your stomach, take it with food. Take your medicine at regular intervals. Do not take it more often than directed. Take all of your medicine as directed even if you think your are better. Do not skip doses or stop your medicine early. Talk to your pediatrician regarding the use of this medicine in children. While this drug may be prescribed for children as young as 6 months for selected conditions, precautions do apply. Overdosage: If you think you have taken too much of this medicine contact a poison control center or emergency room at once. NOTE: This medicine is only for you. Do not share this medicine with others. What if I miss a dose? If you miss a dose, take it as soon as you  can. If it is almost time for your next dose, take only that dose. Do not take double or extra doses. What may interact with this medicine? -aspirin and aspirin-like medicines -carbamazepine -medicines that treat or prevent blood clots like warfarin This list may not describe all possible interactions. Give your health care provider a list of all the medicines, herbs, non-prescription drugs, or dietary supplements you use. Also tell them if you smoke, drink alcohol, or use illegal drugs. Some items may interact with your medicine. What should I watch for while using this medicine? Tell your doctor or health care professional if your symptoms do not improve or if you get new symptoms. Your doctor will monitor your condition and blood work as needed. Do not treat diarrhea with over the counter products. Contact your doctor if you have diarrhea that lasts more than 2 days or if it is severe and watery. This medicine can interfere with some urine glucose and some urine ketone tests. If you use such tests, talk with your health care professional. If you are being treated for a sexually transmitted disease, avoid sexual contact until you have finished your treatment. Having sex can infect your sexual partner. What side effects may I notice from receiving this medicine? Side effects that you should report to your doctor or health care professional as soon as possible: -allergic reactions like skin rash, itching or hives, swelling of the face, lips, or tongue -bloody or watery diarrhea -difficulty breathing or wheezing -dizziness -fever -pain or trouble passing urine or change in the amount of urine -redness, blistering,  peeling or loosening of the skin, including inside the mouth -seizures -unusual bleeding or bruising -unusually weak or tired -yellowing of the eyes or skin Side effects that usually do not require medical attention (report to your doctor or health care professional if they continue  or are bothersome): -diarrhea -headache -genital or anal irritation -loss of appetite -nausea, vomiting -stomach pain, upset, or gas This list may not describe all possible side effects. Call your doctor for medical advice about side effects. You may report side effects to FDA at 1-800-FDA-1088. Where should I keep my medicine? Keep out of the reach of children. Store at room temperature between 20 and 25 degrees C (68 and 77 degrees F). Throw away any unused medicine after the expiration date. NOTE: This sheet is a summary. It may not cover all possible information. If you have questions about this medicine, talk to your doctor, pharmacist, or health care provider.  2018 Elsevier/Gold Standard (2007-12-15 16:31:02)

## 2017-09-18 ENCOUNTER — Other Ambulatory Visit: Payer: Self-pay | Admitting: *Deleted

## 2017-09-18 DIAGNOSIS — R001 Bradycardia, unspecified: Secondary | ICD-10-CM

## 2017-09-19 LAB — URINE CULTURE
MICRO NUMBER: 90028707
RESULT: NO GROWTH
SPECIMEN QUALITY: ADEQUATE

## 2017-09-20 ENCOUNTER — Ambulatory Visit: Payer: Medicare Other | Admitting: Internal Medicine

## 2017-09-20 NOTE — Telephone Encounter (Signed)
Spoke to patient and explained the monitor and its importance. She is declining it for now and "will call us back if she decides to wear it".

## 2017-09-23 ENCOUNTER — Ambulatory Visit: Payer: Medicare Other | Admitting: Thoracic Surgery (Cardiothoracic Vascular Surgery)

## 2017-09-23 ENCOUNTER — Encounter: Payer: Self-pay | Admitting: Thoracic Surgery (Cardiothoracic Vascular Surgery)

## 2017-09-23 ENCOUNTER — Other Ambulatory Visit: Payer: Self-pay

## 2017-09-23 VITALS — BP 126/67 | HR 67 | Resp 18 | Ht 66.5 in | Wt 125.0 lb

## 2017-09-23 DIAGNOSIS — Z951 Presence of aortocoronary bypass graft: Secondary | ICD-10-CM

## 2017-09-23 DIAGNOSIS — Z953 Presence of xenogenic heart valve: Secondary | ICD-10-CM

## 2017-09-23 NOTE — Patient Instructions (Signed)
Continue all previous medications without any changes at this time  You are encouraged to enroll and participate in the outpatient cardiac rehab program beginning as soon as practical.  Endocarditis is a potentially serious infection of heart valves or inside lining of the heart.  It occurs more commonly in patients with diseased heart valves (such as patient's with aortic or mitral valve disease) and in patients who have undergone heart valve repair or replacement.  Certain surgical and dental procedures may put you at risk, such as dental cleaning, other dental procedures, or any surgery involving the respiratory, urinary, gastrointestinal tract, gallbladder or prostate gland.   To minimize your chances for develooping endocarditis, maintain good oral health and seek prompt medical attention for any infections involving the mouth, teeth, gums, skin or urinary tract.    Always notify your doctor or dentist about your underlying heart valve condition before having any invasive procedures. You will need to take antibiotics before certain procedures, including all routine dental cleanings or other dental procedures.  Your cardiologist or dentist should prescribe these antibiotics for you to be taken ahead of time.

## 2017-09-23 NOTE — Progress Notes (Signed)
[address]       [place] [address]             [phone]     CARDIOTHORACIC SURGERY OFFICE NOTE  Referring Provider is Fay Records, MD PCP is Eulas Post, MD   HPI:  Patient is an 84 year old female with history of mitral valve prolapse and mitral regurgitation, chronic diastolic congestive heart failure, coronary artery disease, hypertension, type 2 diabetes mellitus without complications, and scoliosis of the back with degenerative disc disease status post lumbar fusion who returns to the office today for routine follow-up status post mitral valve replacement using a bioprosthetic tissue valve, coronary artery bypass grafting 1, and clipping of the left atrial appendage on 04/23/2017.  The patient's early postoperative recovery in the hospital was notable for the development of atrial fibrillation and acute on chronic diastolic congestive heart failure. Follow-up echocardiogram revealed decreased EF from normal preoperatively to 25-30% after mitral valve replacement. She eventually converted back to sinus rhythm on amiodarone.  She slowly progressed and was eventually discharged home on the 15th postoperative day.  She was readmitted to the hospital briefly in mid September with recurrence of atrial fibrillation with rapid ventricular response and acute renal insufficiency. Follow-up echocardiogram performed 05/21/2017 revealed improved left ventricular systolic function with ejection fraction estimated 35-40%. The mitral valve prosthesis was functioning normally. Medications were adjusted and she was discharged home after a 3 day stay.  She was last seen here in our office on June 10, 2017.  At that time she was making slow but steady progress, although she remained in junctional rhythm.  She was eventually seen by Dr. Curt Bears in the EP clinic and permanent pacemaker was scheduled.  However, when the patient came in for pacemaker implantation on September 16, 2017 she was in sinus rhythm.  Pacemaker was canceled and a Holter monitor has been ordered.  The patient returns to our office today for routine follow-up.  She states that overall she feels better than she did prior to surgery but she is still moving quite slowly.  She gets tired easily and gets winded with physical exertion.  Her breathing has remained stable and she is no longer on any diuretic.  She has not had any dizzy spells or palpitations.  She decided not to participate in the outpatient cardiac rehab program because of the high co-pay associated with her insurance.  She has decided to join the [employer] program at a [employer] but she has not yet started.  She has not had problems with warfarin anticoagulation.   Current Outpatient Medications  Medication Sig Dispense Refill  . aspirin EC 81 MG EC tablet Take 1 tablet (81 mg total) by mouth daily.    . bisacodyl (DULCOLAX) 5 MG EC tablet Take 10 mg by mouth daily as needed (for constipation.).    [name] conjugated estrogens (PREMARIN) vaginal cream Place 1 Applicatorful vaginally daily. (Patient taking differently: Place 1 Applicatorful vaginally daily as needed (FOR VAGINAL DRYNESS.). ) 42.5 g 12  . esomeprazole (NEXIUM) 20 MG capsule Take 20 mg by mouth daily at 12 noon.    . famotidine (PEPCID) 20 MG tablet Take 20 mg by mouth daily.     . fexofenadine (ALLEGRA) 180 MG tablet Take 90 mg by mouth daily as needed for allergies or rhinitis (allergy headache).    . fluticasone (FLONASE) 50 MCG/ACT nasal spray SHAKE LIQUID AND USE 2 SPRAYS IN EACH NOSTRIL DAILY  AS NEEDED FOR ALLERGIES 16 g 2  . metFORMIN (GLUCOPHAGE) 500 MG tablet Take 2 tablets (1,000 mg total) by mouth 2 (two) times daily with a meal. 360 tablet 3  . metoprolol succinate (TOPROL-XL) 25 MG 24 hr tablet Take 1 tablet (25 mg total) by mouth every evening. Take with or immediately following a meal. 90 tablet 2  . Multiple Vitamins-Minerals (ICAPS AREDS 2) CAPS Take 1 capsule  by mouth 2 (two) times daily.    Glory Rosebush DELICA LANCETS 26R MISC USE TO TEST TWICE DAILY AS DIRECTED (Patient taking differently: USE TO TEST ONCE DAILY AS DIRECTED) 100 each 0  . ONETOUCH VERIO test strip TEST BLOOD GLUCOSE TWICE DAILY (Patient taking differently: TEST BLOOD GLUCOSE ONCE DAILY) 200 each 0  . sacubitril-valsartan (ENTRESTO) 24-26 MG Take 1 tablet by mouth 2 (two) times daily. 90 tablet 3  . trimethoprim (TRIMPEX) 100 MG tablet TAKE 1 TABLET(100 MG) BY MOUTH DAILY 90 tablet 0  . warfarin (COUMADIN) 2.5 MG tablet Take as directed by [facility] (Patient taking differently: Take 2.5-3.75 mg by mouth See admin instructions. TAKE 1.5 TABLETS (3.75 MG) DAILY, EXCEPT ON MONDAYS TAKE 1 TABLET (2.5 MG)) 150 tablet 0  . furosemide (LASIX) 20 MG tablet Take 1 tablet (20 mg total) by mouth as needed for fluid or edema. (Patient taking differently: Take 20 mg by mouth daily as needed for fluid or edema. ) 30 tablet 6  . potassium chloride (K-DUR) 10 MEQ tablet Take 1 tablet (10 mEq total) by mouth as needed (WHEN TAKING LASIX). 30 tablet 6   No current facility-administered medications for this visit.       Physical Exam:   BP 126/67 (BP Location: Left Arm, Patient Position: Sitting, Cuff Size: Normal)   Pulse 67   Resp 18   Ht 5' 6.5" (1.689 m)   Wt 125 lb (56.7 kg)   SpO2 98% Comment: RA  BMI 19.87 kg/m   General:  Elderly and frail but well appearing  Chest:   Clear w/ symmetrical breath sounds  CV:   Regular rate and rhythm without murmur  Incisions:  Well-healed  Abdomen:  Soft nontender  Extremities:  Warm and well perfused with trace lower extremity edema, left greater than right  Diagnostic Tests:  n/a   Impression:  Patient continues to make slow but steady progress approximately 5 months status post mitral valve replacement using a bioprosthetic tissue valve and single-vessel coronary artery bypass grafting.  Early following surgery she had problems with  recurrent paroxysmal atrial fibrillation, and since then she had problems with junctional rhythm.  More recently she has been back in sinus rhythm.  Plan is for permanent pacemaker implantation have been put on hold and Holter monitor has been ordered.  Patient still appears weak and would likely benefit from participation in the outpatient cardiac rehab program.  Her weight appears stable and symptoms of dyspnea are slowly improving.  Plan:  We have not made any recommendations to change the patient's medications at this time.  I have encouraged the patient to continue to increase her physical activity as tolerated without any particular limitations.  I think she would benefit from the outpatient cardiac rehab program, but we have discussed how she might go about physical rehab on her own through the [employer] program.  The patient has been reminded regarding the importance of dental hygiene and the lifelong need for antibiotic prophylaxis for all dental cleanings and other related invasive procedures.  Patient  will return to our office for routine follow-up next August, approximately 1 year following her original surgery.  During the interim period of time she will call and return only should specific problems or questions arise.  I spent in excess of 15 minutes during the conduct of this office consultation and >50% of this time involved direct face-to-face encounter with the patient for counseling and/or coordination of their care.    [name]. Roxy Manns, MD 09/23/2017 10:28 AM

## 2017-09-24 NOTE — Telephone Encounter (Signed)
Forwarding to Dr. Camnitz for his FYI 

## 2017-09-26 ENCOUNTER — Telehealth: Payer: Self-pay | Admitting: *Deleted

## 2017-09-26 ENCOUNTER — Ambulatory Visit: Payer: Medicare Other

## 2017-09-26 ENCOUNTER — Ambulatory Visit (INDEPENDENT_AMBULATORY_CARE_PROVIDER_SITE_OTHER): Payer: Medicare Other

## 2017-09-26 DIAGNOSIS — Z7901 Long term (current) use of anticoagulants: Secondary | ICD-10-CM

## 2017-09-26 DIAGNOSIS — I4891 Unspecified atrial fibrillation: Secondary | ICD-10-CM | POA: Diagnosis not present

## 2017-09-26 DIAGNOSIS — I4819 Other persistent atrial fibrillation: Secondary | ICD-10-CM

## 2017-09-26 DIAGNOSIS — I481 Persistent atrial fibrillation: Secondary | ICD-10-CM | POA: Diagnosis not present

## 2017-09-26 DIAGNOSIS — I059 Rheumatic mitral valve disease, unspecified: Secondary | ICD-10-CM

## 2017-09-26 DIAGNOSIS — Z953 Presence of xenogenic heart valve: Secondary | ICD-10-CM

## 2017-09-26 LAB — POCT INR: INR: 1.4

## 2017-09-26 NOTE — Telephone Encounter (Signed)
Spoke with patient regarding wound check appointment 09/26/17. PPM implant cancelled per Dr. Curt Bears progress note 09/16/17. Confirmation with patient no PPM implanted. Appointment 09/26/17 cancelled for PPM wound check. Patient verbalized understanding and appreciation.

## 2017-09-26 NOTE — Patient Instructions (Signed)
Description   Take 2 tablets today, then start taking 1.5 tablets daily.  Pt states she needs to stretch visits out secondary to financial reasons.  Pt is aware of risks associated with delayed follow-up. Pt refuses to return until 2 weeks.

## 2017-09-27 ENCOUNTER — Telehealth (HOSPITAL_COMMUNITY): Payer: Self-pay | Admitting: Pharmacist

## 2017-09-27 NOTE — Telephone Encounter (Signed)
Mr. Arntz called stating that the pharmacy told him that Mrs. Dorfman's Entresto was going to be >$100. I have already given PANF grant info to [employer] on [address]. They stated that the Rx was transferred to another pharmacy along with the [facility] information but it was filled on 1/8 for a 45 day supply so it will not be able to be refilled until 09/28/17. I have relayed this info to Mr. Trahan who will call with any further issues.   [name]. Velva Harman, PharmD, BCPS, CPP Clinical Pharmacist Phone: [phone] 09/27/2017 9:39 AM

## 2017-09-30 ENCOUNTER — Ambulatory Visit (INDEPENDENT_AMBULATORY_CARE_PROVIDER_SITE_OTHER): Payer: Medicare Other | Admitting: Family Medicine

## 2017-09-30 ENCOUNTER — Telehealth: Payer: Self-pay | Admitting: Cardiology

## 2017-09-30 VITALS — BP 100/60 | HR 53 | Temp 97.2°F | Ht 66.0 in | Wt 126.6 lb

## 2017-09-30 DIAGNOSIS — E119 Type 2 diabetes mellitus without complications: Secondary | ICD-10-CM | POA: Diagnosis not present

## 2017-09-30 DIAGNOSIS — R300 Dysuria: Secondary | ICD-10-CM

## 2017-09-30 DIAGNOSIS — F5104 Psychophysiologic insomnia: Secondary | ICD-10-CM

## 2017-09-30 LAB — POCT URINALYSIS DIPSTICK
BILIRUBIN UA: NEGATIVE
Glucose, UA: NEGATIVE
KETONES UA: NEGATIVE
Nitrite, UA: NEGATIVE
Protein, UA: NEGATIVE
RBC UA: NEGATIVE
SPEC GRAV UA: 1.015 (ref 1.010–1.025)
UROBILINOGEN UA: 0.2 U/dL
pH, UA: 5 (ref 5.0–8.0)

## 2017-09-30 LAB — POCT GLYCOSYLATED HEMOGLOBIN (HGB A1C): Hemoglobin A1C: 6.4

## 2017-09-30 MED ORDER — ZOLPIDEM TARTRATE 5 MG PO TABS: 5.0000 mg | ORAL_TABLET | Freq: Every evening | ORAL | 1 refills | Status: DC | PRN

## 2017-09-30 MED ORDER — CEPHALEXIN 500 MG PO CAPS: 500.0000 mg | ORAL_CAPSULE | Freq: Three times a day (TID) | ORAL | 0 refills | Status: DC

## 2017-09-30 NOTE — Telephone Encounter (Signed)
[name] is calling back because she has schedule the appt for the monitor and she is wanting to speak you about it because she has questions . Thanks

## 2017-09-30 NOTE — Telephone Encounter (Signed)
Returned call to the patient who had questions regarding how the event monitor worked. Made patient aware that she will have electrodes or stickers placed on her chest that is attached to a monitor that will monitor her heart rate and rhythm. Patient asking if she can take a shower. I let her know that she could. Patient asking if the stickers are glued to her skin. Made patient aware that they are not and that she will be provided with extra stickers in case they need to be replaced. Made patient aware that when she comes in for appointment to have the monitor placed that they will go over everything in great detail and make sure that she feels comfortable. Patient thanked me for the call.

## 2017-09-30 NOTE — Patient Instructions (Signed)
Hold on starting the anti-biotic unless you develop any fever or recurrent burning with urine.

## 2017-09-30 NOTE — Telephone Encounter (Signed)
No Encounter Needed

## 2017-09-30 NOTE — Progress Notes (Signed)
Subjective:     Patient ID: [name], female   DOB: November 23, 1933, 84 y.o.   MRN: [id number]  HPI Patient seen for the following several items  Onset last week and a some burning with urination and urine frequency. She took some over-the-counter Azo-Standard and burning symptoms are better but she still has some frequency. Has been getting up about every hour during the night past couple of nights. She was seen here recently for dysuria and urine culture was negative. She was treated at time with Suprax. She complained of a cost of medication. She takes trimethoprim 1 daily for prophylaxis as she has had multiple UTIs in the past. Denies any fevers or chills. No nausea or vomiting.  Patient has long-standing history of insomnia. Difficulty getting asleep. Avoids caffeine and takes no alcohol. He tried melatonin without improvement in the past. Has taken clonazepam which helped the past. We've cautioned her about benzodiazepine use.  Type 2 diabetes. Patient requesting repeat A1c. Last A1c 5.9%. She remains on metformin.  Past Medical History:  Diagnosis Date  . Allergy   . Anxiety   . Arthritis   . Atrial enlargement, left   . CHF (congestive heart failure) ([place])   . Coronary artery disease   . Depression   . Diabetes mellitus without complication (HCC)    fasting cbg 110s  . Dyspnea   . GERD (gastroesophageal reflux disease)   . H/O hiatal hernia   . Headache(784.0)   . Heart murmur    06/10/2014 seeing new cardiologist  . Heart murmur   . HOH (hard of hearing)    HOH in left ear; needs to speak to patient in right ear  . Hyperlipidemia   . Hypertension   . Mitral regurgitation   . Mitral valve prolapse   . Palpitations    afib on 03/04/17 EKG  . PONV (postoperative nausea and vomiting)    Patient stated "I like the patch behind my ear"  . 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 S/P mitral valve replacement with bioprosthetic valve 04/23/2017    [facility] Mitral bovine pericardial tissue valve: Model 7300TFX, Serial [id number], size 31  . UTI (lower urinary tract infection)    Past Surgical History:  Procedure Laterality Date  . ABDOMINAL HYSTERECTOMY    . Anterior posterior and enterocele repairs  09/21/2004   With uterosacral cardinal colposuspension, partial colpocleisis; Selinda Orion, MD  . BACK SURGERY    . BREAST ENHANCEMENT SURGERY  02/25/2002   Bilateral reduction and excision of accessory breast tissue underneath the left breast; Aretha Parrot., MD  . BREAST SURGERY  11   reduction  . CARDIAC CATHETERIZATION     no PCI  . CARDIAC CATHETERIZATION  05/02/2017  . CATARACT EXTRACTION Bilateral   . CLIPPING OF ATRIAL APPENDAGE  04/23/2017   Procedure: CLIPPING OF LEFT ATRIAL APPENDAGE;  Surgeon: [name], MD;  Location: [place];  Service: Open Heart Surgery;;  . COLONOSCOPY    . CORONARY ARTERY BYPASS GRAFT N/A 04/23/2017   Procedure: CORONARY ARTERY BYPASS GRAFTING (CABG), ON PUMP, TIMES ONE, USING ENDOSCOPICALLY HARVESTED RIGHT GREATER SAPHENOUS VEIN;  Surgeon: [name], MD;  Location: [place];  Service: Open Heart Surgery;  Laterality: N/A;  . EYE SURGERY Bilateral    cataract removal  . LUMBAR LAMINECTOMY/ DECOMPRESSION WITH MET-RX N/A 03/16/2014   Procedure: LUMBAR FIVE-SACRAL ONE EXTRAFORAMINAL DISKECTOMY WITH METREX;  Surgeon: Kristeen Miss, MD;  [facility]URO [facility]S;  Service: Neurosurgery;  Laterality: N/A;  . LUMBAR LAMINECTOMY/DECOMPRESSION MICRODISCECTOMY Left 06/08/2014   Procedure: Left Lumbar Five-Sacral One Microdiskectomy;  Surgeon: Kristeen Miss, MD;  Location: [place] NEURO ORS;  Service: Neurosurgery;  Laterality: Left;  Left L5-S1 Microdiskectomy  . MITRAL VALVE REPLACEMENT N/A 04/23/2017   Procedure: MITRAL VALVE (MV) REPLACEMENT;  Surgeon: [name], MD;  Location: [place];  Service: Open Heart Surgery;  Laterality: N/A;  . PACEMAKER IMPLANT N/A 09/16/2017   Procedure: PACEMAKER  IMPLANT;  Surgeon: Constance Haw, MD;  Location: [place] CV LAB;  Service: Cardiovascular;  Laterality: N/A;  . PELVIC FLOOR REPAIR    . POSTERIOR LUMBAR FUSION 4 LEVEL N/A 08/08/2015   Procedure: T8-L1 posterior lateral fusion with decompression T12-L1;  Surgeon: Kristeen Miss, MD;  Location: [place] NEURO ORS;  Service: Neurosurgery;  Laterality: N/A;  T8-L1 posterior lateral fusion with decompression T12-L1  . RIGHT/LEFT HEART CATH AND CORONARY ANGIOGRAPHY N/A 03/28/2017   Procedure: Right/Left Heart Cath and Coronary Angiography;  Surgeon: Nelva Bush, MD;  Location: [place] CV LAB;  Service: Cardiovascular;  Laterality: N/A;  . ROTATOR CUFF REPAIR Bilateral    11.12  . TEE WITHOUT CARDIOVERSION N/A 03/04/2017   Procedure: TRANSESOPHAGEAL ECHOCARDIOGRAM (TEE);  Surgeon: Fay Records, MD;  Location: [place];  Service: Cardiovascular;  Laterality: N/A;  . TEE WITHOUT CARDIOVERSION N/A 04/23/2017   Procedure: TRANSESOPHAGEAL ECHOCARDIOGRAM (TEE);  Surgeon: [name], MD;  Location: [place];  Service: Open Heart Surgery;  Laterality: N/A;  . TONSILLECTOMY AND ADENOIDECTOMY      reports that  has never smoked. she has never used smokeless tobacco. She reports that she does not drink alcohol or use drugs. family history includes Arthritis in her father and mother; Heart disease in her father; Heart failure in her father; Hyperlipidemia in her father and mother; Hypertension in her mother. Allergies  Allergen Reactions  . Cortisone Other (See Comments)    Insomnia, heart palpitations (po only)  . Statins Other (See Comments)    Myalgias   . Codeine Swelling    FACIAL SWELLING SEVERITY UNKNOWN  . Sulfonamide Derivatives     UNSPECIFIED REACTION-childhood reaction  . Amiodarone Other (See Comments)    Tremors with 400 mg BID   . Lactose Intolerance (Gi) Nausea And Vomiting     Review of Systems  Constitutional: Positive for fatigue. Negative for chills and fever.   Respiratory: Negative for shortness of breath.   Cardiovascular: Negative for chest pain.  Gastrointestinal: Negative for abdominal pain.  Endocrine: Positive for polyuria. Negative for polydipsia.  Genitourinary: Positive for dysuria and frequency. Negative for hematuria.  Psychiatric/Behavioral: Positive for sleep disturbance.       Objective:   Physical Exam  Constitutional: She is oriented to person, place, and time. She appears well-developed and well-nourished.  Cardiovascular: Normal rate and regular rhythm.  Pulmonary/Chest: Effort normal and breath sounds normal. No respiratory distress. She has no wheezes. She has no rales.  Musculoskeletal: She exhibits no edema.  Neurological: She is alert and oriented to person, place, and time.  Psychiatric: She has a normal mood and affect. Her behavior is normal.       Assessment:     #1 dysuria. She is presenting now with just some urine frequency and symptoms actually improved compared to the weekend. Recent urine culture negative  #2 type 2 diabetes fairly well controlled with A1c today 6.4%  #3 chronic insomnia  Plan:     -Urine culture sent -We've recommended holding on antibiotics this point will she develops any fever or burning with urination again or other progressive symptoms. Printed prescription for Keflex 500 mg 3 times a day for 5 days pending culture results -Sleep hygiene discussed -Recommend try to avoid regular use of sedative medications. we discussed limitations of benzodiazepines. We agreed to very limited Ambien 5 mg daily at bedtime for severe insomnia -Routine follow-up in 3 months and sooner as needed  Eulas Post MD [facility]

## 2017-10-02 ENCOUNTER — Other Ambulatory Visit: Payer: Self-pay | Admitting: Family Medicine

## 2017-10-02 LAB — URINE CULTURE
MICRO NUMBER:: 90085581
SPECIMEN QUALITY:: ADEQUATE

## 2017-10-02 MED ORDER — NITROFURANTOIN MONOHYD MACRO 100 MG PO CAPS: 100.0000 mg | ORAL_CAPSULE | Freq: Two times a day (BID) | ORAL | 0 refills | Status: DC

## 2017-10-10 ENCOUNTER — Ambulatory Visit (INDEPENDENT_AMBULATORY_CARE_PROVIDER_SITE_OTHER): Payer: Medicare Other | Admitting: *Deleted

## 2017-10-10 ENCOUNTER — Ambulatory Visit (INDEPENDENT_AMBULATORY_CARE_PROVIDER_SITE_OTHER): Payer: Medicare Other

## 2017-10-10 DIAGNOSIS — I481 Persistent atrial fibrillation: Secondary | ICD-10-CM | POA: Diagnosis not present

## 2017-10-10 DIAGNOSIS — R001 Bradycardia, unspecified: Secondary | ICD-10-CM

## 2017-10-10 DIAGNOSIS — I4819 Other persistent atrial fibrillation: Secondary | ICD-10-CM

## 2017-10-10 DIAGNOSIS — I059 Rheumatic mitral valve disease, unspecified: Secondary | ICD-10-CM | POA: Diagnosis not present

## 2017-10-10 DIAGNOSIS — Z7901 Long term (current) use of anticoagulants: Secondary | ICD-10-CM | POA: Diagnosis not present

## 2017-10-10 DIAGNOSIS — I4891 Unspecified atrial fibrillation: Secondary | ICD-10-CM

## 2017-10-10 DIAGNOSIS — Z953 Presence of xenogenic heart valve: Secondary | ICD-10-CM

## 2017-10-10 LAB — POCT INR: INR: 1.7

## 2017-10-10 NOTE — Patient Instructions (Addendum)
Description   Take 2 tablets today, then start taking 1.5 tablets daily except 2 tablets on Mondays.  Pt states she needs to stretch visits out secondary to financial reasons. Recheck in 11days.

## 2017-10-15 ENCOUNTER — Ambulatory Visit (INDEPENDENT_AMBULATORY_CARE_PROVIDER_SITE_OTHER): Payer: Medicare Other | Admitting: Family Medicine

## 2017-10-15 ENCOUNTER — Encounter: Payer: Self-pay | Admitting: Family Medicine

## 2017-10-15 VITALS — BP 110/70 | Temp 97.2°F | Wt 127.2 lb

## 2017-10-15 DIAGNOSIS — R300 Dysuria: Secondary | ICD-10-CM | POA: Diagnosis not present

## 2017-10-15 DIAGNOSIS — N390 Urinary tract infection, site not specified: Secondary | ICD-10-CM | POA: Diagnosis not present

## 2017-10-15 DIAGNOSIS — N952 Postmenopausal atrophic vaginitis: Secondary | ICD-10-CM

## 2017-10-15 LAB — POCT URINALYSIS DIPSTICK
BILIRUBIN UA: NEGATIVE
GLUCOSE UA: NEGATIVE
Ketones, UA: NEGATIVE
Protein, UA: NEGATIVE
RBC UA: NEGATIVE
Spec Grav, UA: 1.025 (ref 1.010–1.025)
Urobilinogen, UA: 0.2 E.U./dL
pH, UA: 6 (ref 5.0–8.0)

## 2017-10-15 MED ORDER — NITROFURANTOIN MONOHYD MACRO 100 MG PO CAPS: 100.0000 mg | ORAL_CAPSULE | Freq: Two times a day (BID) | ORAL | 0 refills | Status: DC

## 2017-10-15 NOTE — Progress Notes (Signed)
Subjective:     Patient ID: [name], female   DOB: 1934-04-21, 84 y.o.   MRN: [id number]  HPI Patient seen with dysuria. Onset this past Saturday. No fevers or chills. She has burning with urination along with some frequency. No nausea or vomiting. She's had frequent UTIs in the past. Currently is on trimethoprim 100 mg daily for prophylaxis. In looking back over her records she has had several cultures recently as follows  09/30/17-Citrobacter species  09/17/17-negative culture  07/01/17-Enterobacter species  06/10/17-Klebsiella pneumonia  04/13/16-Enterobacter species  Last culture was resistant to multiple medications. She had initially been placed on Keflex but we had to use Macrobid. Only other options were basically IV medications or Rocephin  She has history of atrophic vaginitis. She has Premarin cream but does not use regularly  Past Medical History:  Diagnosis Date  . Allergy   . Anxiety   . Arthritis   . Atrial enlargement, left   . CHF (congestive heart failure) ([place])   . Coronary artery disease   . Depression   . Diabetes mellitus without complication (HCC)    fasting cbg 110s  . Dyspnea   . GERD (gastroesophageal reflux disease)   . H/O hiatal hernia   . Headache(784.0)   . Heart murmur    06/10/2014 seeing new cardiologist  . Heart murmur   . HOH (hard of hearing)    HOH in left ear; needs to speak to patient in right ear  . Hyperlipidemia   . Hypertension   . Mitral regurgitation   . Mitral valve prolapse   . Palpitations    afib on 03/04/17 EKG  . PONV (postoperative nausea and vomiting)    Patient stated "I like the patch behind my ear"  . 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 S/P mitral valve replacement with bioprosthetic valve 04/23/2017   [facility] Mitral bovine pericardial tissue valve: Model 7300TFX, Serial [id number], size 31  . UTI (lower urinary tract infection)    Past Surgical History:  Procedure Laterality  Date  . ABDOMINAL HYSTERECTOMY    . Anterior posterior and enterocele repairs  09/21/2004   With uterosacral cardinal colposuspension, partial colpocleisis; Selinda Orion, MD  . BACK SURGERY    . BREAST ENHANCEMENT SURGERY  02/25/2002   Bilateral reduction and excision of accessory breast tissue underneath the left breast; Aretha Parrot., MD  . BREAST SURGERY  11   reduction  . CARDIAC CATHETERIZATION     no PCI  . CARDIAC CATHETERIZATION  05/02/2017  . CATARACT EXTRACTION Bilateral   . CLIPPING OF ATRIAL APPENDAGE  04/23/2017   Procedure: CLIPPING OF LEFT ATRIAL APPENDAGE;  Surgeon: [name], MD;  Location: [address];  Service: Open Heart Surgery;;  . COLONOSCOPY    . CORONARY ARTERY BYPASS GRAFT N/A 04/23/2017   Procedure: CORONARY ARTERY BYPASS GRAFTING (CABG), ON PUMP, TIMES ONE, USING ENDOSCOPICALLY HARVESTED RIGHT GREATER SAPHENOUS VEIN;  Surgeon: [name], MD;  Location: [place];  Service: Open Heart Surgery;  Laterality: N/A;  . EYE SURGERY Bilateral    cataract removal  . LUMBAR LAMINECTOMY/ DECOMPRESSION WITH MET-RX N/A 03/16/2014   Procedure: LUMBAR FIVE-SACRAL ONE EXTRAFORAMINAL DISKECTOMY WITH METREX;  Surgeon: Kristeen Miss, MD;  Location: [place] [facility]UR[facility]S;  Service: Neurosurgery;  Laterality: N/A;  . LUMBAR LAMINECTOMY/DECOMPRESSION MICRODISCECTOMY Left 06/08/2014   Procedure: Left Lumbar Five-Sacral One Microdiskectomy;  Surgeon: Kristeen Miss, MD;  Location:  [place] NEURO ORS;  Service: Neurosurgery;  Laterality: Left;  Left L5-S1 Microdiskectomy  . MITRAL VALVE REPLACEMENT N/A 04/23/2017   Procedure: MITRAL VALVE (MV) REPLACEMENT;  Surgeon: [name], MD;  Location: [place];  Service: Open Heart Surgery;  Laterality: N/A;  . PACEMAKER IMPLANT N/A 09/16/2017   Procedure: PACEMAKER IMPLANT;  Surgeon: Constance Haw, MD;  Location: [place] CV LAB;  Service: Cardiovascular;  Laterality: N/A;  . PELVIC FLOOR REPAIR    . POSTERIOR LUMBAR FUSION 4 LEVEL N/A  08/08/2015   Procedure: T8-L1 posterior lateral fusion with decompression T12-L1;  Surgeon: Kristeen Miss, MD;  Location: [place] NEURO ORS;  Service: Neurosurgery;  Laterality: N/A;  T8-L1 posterior lateral fusion with decompression T12-L1  . RIGHT/LEFT HEART CATH AND CORONARY ANGIOGRAPHY N/A 03/28/2017   Procedure: Right/Left Heart Cath and Coronary Angiography;  Surgeon: Nelva Bush, MD;  Location: [place] CV LAB;  Service: Cardiovascular;  Laterality: N/A;  . ROTATOR CUFF REPAIR Bilateral    11.12  . TEE WITHOUT CARDIOVERSION N/A 03/04/2017   Procedure: TRANSESOPHAGEAL ECHOCARDIOGRAM (TEE);  Surgeon: Fay Records, MD;  Location: [place];  Service: Cardiovascular;  Laterality: N/A;  . TEE WITHOUT CARDIOVERSION N/A 04/23/2017   Procedure: TRANSESOPHAGEAL ECHOCARDIOGRAM (TEE);  Surgeon: [name], MD;  Location: [place];  Service: Open Heart Surgery;  Laterality: N/A;  . TONSILLECTOMY AND ADENOIDECTOMY      reports that  has never smoked. she has never used smokeless tobacco. She reports that she does not drink alcohol or use drugs. family history includes Arthritis in her father and mother; Heart disease in her father; Heart failure in her father; Hyperlipidemia in her father and mother; Hypertension in her mother. Allergies  Allergen Reactions  . Cortisone Other (See Comments)    Insomnia, heart palpitations (po only)  . Statins Other (See Comments)    Myalgias   . Codeine Swelling    FACIAL SWELLING SEVERITY UNKNOWN  . Sulfonamide Derivatives     UNSPECIFIED REACTION-childhood reaction  . Amiodarone Other (See Comments)    Tremors with 400 mg BID   . Lactose Intolerance (Gi) Nausea And Vomiting     Review of Systems  Constitutional: Negative for appetite change, chills and fever.  Gastrointestinal: Negative for abdominal pain, constipation, diarrhea, nausea and vomiting.  Genitourinary: Positive for dysuria and frequency. Negative for hematuria and vaginal pain.   Musculoskeletal: Negative for back pain.  Neurological: Negative for dizziness.       Objective:   Physical Exam  Constitutional: She appears well-developed and well-nourished.  HENT:  Head: Normocephalic and atraumatic.  Neck: Neck supple. No thyromegaly present.  Cardiovascular: Normal rate, regular rhythm and normal heart sounds.  Pulmonary/Chest: Breath sounds normal.  Abdominal: Soft. Bowel sounds are normal. There is no tenderness.       Assessment:     Recurrent UTI in spite of prophylactic therapy with trimethoprim. History of atrophic vaginitis    Plan:     -urine culture sent -Stressed importance of good hydration -Start Macrobid 1 twice a day for 7 days pending culture result. We explained this is not our first preference (given her age) but last culture was resistant to multiple outpatient antibiotics except for Macrobid and she tolerated this well -We have recommended daily use of Premarin vaginal cream for now to see if this can help reduce frequency -Consider urology referral  Eulas Post MD [facility]

## 2017-10-15 NOTE — Patient Instructions (Signed)

## 2017-10-17 LAB — URINE CULTURE
MICRO NUMBER:: 90154005
SPECIMEN QUALITY:: ADEQUATE

## 2017-10-21 ENCOUNTER — Ambulatory Visit: Payer: Medicare Other | Admitting: Internal Medicine

## 2017-10-21 ENCOUNTER — Encounter: Payer: Self-pay | Admitting: Internal Medicine

## 2017-10-21 ENCOUNTER — Ambulatory Visit (INDEPENDENT_AMBULATORY_CARE_PROVIDER_SITE_OTHER): Payer: Medicare Other | Admitting: *Deleted

## 2017-10-21 VITALS — BP 118/60 | HR 66 | Ht 66.0 in | Wt 130.0 lb

## 2017-10-21 DIAGNOSIS — I059 Rheumatic mitral valve disease, unspecified: Secondary | ICD-10-CM | POA: Diagnosis not present

## 2017-10-21 DIAGNOSIS — I498 Other specified cardiac arrhythmias: Secondary | ICD-10-CM

## 2017-10-21 DIAGNOSIS — Z952 Presence of prosthetic heart valve: Secondary | ICD-10-CM

## 2017-10-21 DIAGNOSIS — Z953 Presence of xenogenic heart valve: Secondary | ICD-10-CM | POA: Diagnosis not present

## 2017-10-21 DIAGNOSIS — I2510 Atherosclerotic heart disease of native coronary artery without angina pectoris: Secondary | ICD-10-CM

## 2017-10-21 DIAGNOSIS — I4891 Unspecified atrial fibrillation: Secondary | ICD-10-CM | POA: Diagnosis not present

## 2017-10-21 DIAGNOSIS — Z7901 Long term (current) use of anticoagulants: Secondary | ICD-10-CM

## 2017-10-21 DIAGNOSIS — I4819 Other persistent atrial fibrillation: Secondary | ICD-10-CM

## 2017-10-21 DIAGNOSIS — I5022 Chronic systolic (congestive) heart failure: Secondary | ICD-10-CM

## 2017-10-21 DIAGNOSIS — I481 Persistent atrial fibrillation: Secondary | ICD-10-CM

## 2017-10-21 LAB — POCT INR: INR: 2.2

## 2017-10-21 NOTE — Patient Instructions (Signed)
Your physician recommends that you continue on your current medications as directed. Please refer to the Current Medication list given to you today. Your physician recommends that you return for lab work in: TODAY (BMET, CBC, BNP)  Keep upcoming appointment with [place].

## 2017-10-21 NOTE — Patient Instructions (Signed)
Description   Continue taking 1.5 tablets daily except 2 tablets on Mondays.  Pt states she needs to stretch visits out secondary to financial reasons. Recheck in 3 weeks.

## 2017-10-22 LAB — BASIC METABOLIC PANEL
BUN/Creatinine Ratio: 22 (ref 12–28)
BUN: 14 mg/dL (ref 8–27)
CALCIUM: 9.3 mg/dL (ref 8.7–10.3)
CO2: 22 mmol/L (ref 20–29)
Chloride: 100 mmol/L (ref 96–106)
Creatinine, Ser: 0.65 mg/dL (ref 0.57–1.00)
GFR, EST AFRICAN AMERICAN: 96 mL/min/{1.73_m2} (ref >59)
GFR, EST NON AFRICAN AMERICAN: 84 mL/min/{1.73_m2} (ref >59)
Glucose: 66 mg/dL (ref 65–99)
POTASSIUM: 4.5 mmol/L (ref 3.5–5.2)
Sodium: 138 mmol/L (ref 134–144)

## 2017-10-22 LAB — CBC
HEMATOCRIT: 33.8 % — AB (ref 34.0–46.6)
HEMOGLOBIN: 10.9 g/dL — AB (ref 11.1–15.9)
MCH: 30.2 pg (ref 26.6–33.0)
MCHC: 32.2 g/dL (ref 31.5–35.7)
MCV: 94 fL (ref 79–97)
Platelets: 284 10*3/uL (ref 150–379)
RBC: 3.61 x10E6/uL — AB (ref 3.77–5.28)
RDW: 16.1 % — ABNORMAL HIGH (ref 12.3–15.4)
WBC: 7.6 10*3/uL (ref 3.4–10.8)

## 2017-10-22 LAB — PRO B NATRIURETIC PEPTIDE: NT-Pro BNP: 359 pg/mL (ref 0–738)

## 2017-10-22 NOTE — Progress Notes (Signed)
Cardiology Office Note   Date:  10/22/2017   ID:  [name], DOB 23-Jul-1934, MRN [id number]  PCP:  Eulas Post, MD  Cardiologist:   Dorris Carnes, MD   F/U of  CHF and MV dz      History of Present Illness: [name] is a 84 y.o. female with a history of CAD and MVP  She is s/p CABG (SVG to RCA) and MVR with bioprostheiss and L atrial appendage clipping in Aug 2018.  Post op course significant for CHF with echo showing LVEF 25 to 30%.  Followed by CHF service  Required inotropic support.  Pt also developed atrial fibrillation  Treated with amiodarone and coumadin.   In September amiodarone was increased due to recurrent atrial fibrillation.   She was hsopitallied again in September 2018 with frecurrent atrial fib with RVR  Placed on milrinone for low output state.  Converted to SR with IV amiodaone.  Echo on 9.11.18 LVEF 35 to 40%  MVR functioning OK She was seen by B Bhagat in October  Found to be in a junctional rhythm Also had tremors Amiodarone was stopped. Seen by D Bensimhon on 07/10/17  Referred to EP for consideration of PPM since afib poorly tolerated The pt was seen by Elliot Cousin in November  Stated on metoprolol  Seen back on 12/20   Remained in junctional rhythm   Set up for PPM She presented to the hospital on 1/7 for PPM    Found to be in SR  Had possible UTI  Still complained of fatigue  Procedure cancelled due to infection  Set up for 30 D monitor [name] was just seen by B Burchette on 2/5 with dysuria.  On ABX prophycacitally Placed on macrobid.   She returns today for f/u  Still notes dysuria. Pt still reports fatigue, SOB with activity   Denies dizziness  No palpitations.   Does not like wearing event monitor        Current Meds  Medication Sig  . bisacodyl (DULCOLAX) 5 MG EC tablet Take 10 mg by mouth daily as needed (for constipation.).  [name] conjugated estrogens (PREMARIN) vaginal cream Place 1 Applicatorful vaginally daily. (Patient taking  differently: Place 1 Applicatorful vaginally daily as needed (FOR VAGINAL DRYNESS.). )  . esomeprazole (NEXIUM) 20 MG capsule Take 20 mg by mouth daily at 12 noon.  . famotidine (PEPCID) 20 MG tablet Take 20 mg by mouth daily.   . fexofenadine (ALLEGRA) 180 MG tablet Take 90 mg by mouth daily as needed for allergies or rhinitis (allergy headache).  . fluticasone (FLONASE) 50 MCG/ACT nasal spray SHAKE LIQUID AND USE 2 SPRAYS IN EACH NOSTRIL DAILY AS NEEDED FOR ALLERGIES  . metFORMIN (GLUCOPHAGE) 500 MG tablet Take 2 tablets (1,000 mg total) by mouth 2 (two) times daily with a meal.  . metoprolol succinate (TOPROL-XL) 25 MG 24 hr tablet Take 1 tablet (25 mg total) by mouth every evening. Take with or immediately following a meal.  . Multiple Vitamins-Minerals (ICAPS AREDS 2) CAPS Take 1 capsule by mouth 2 (two) times daily.  . nitrofurantoin, macrocrystal-monohydrate, (MACROBID) 100 MG capsule Take 1 capsule (100 mg total) by mouth 2 (two) times daily.  Glory Rosebush DELICA LANCETS 42A MISC USE TO TEST TWICE DAILY AS DIRECTED (Patient taking differently: USE TO TEST ONCE DAILY AS DIRECTED)  . ONETOUCH VERIO test strip TEST BLOOD GLUCOSE TWICE DAILY (Patient taking differently: TEST BLOOD GLUCOSE ONCE DAILY)  . sacubitril-valsartan (ENTRESTO)  24-26 MG Take 1 tablet by mouth 2 (two) times daily.  [name] trimethoprim (TRIMPEX) 100 MG tablet TAKE 1 TABLET(100 MG) BY MOUTH DAILY  . warfarin (COUMADIN) 2.5 MG tablet Take as directed by [facility] (Patient taking differently: Take 2.5-3.75 mg by mouth See admin instructions. TAKE 1.5 TABLETS (3.75 MG) DAILY, EXCEPT ON MONDAYS TAKE 1 TABLET (2.5 MG))  . zolpidem (AMBIEN) 5 MG tablet Take 1 tablet (5 mg total) by mouth at bedtime as needed for sleep.     Allergies:   Cortisone; Statins; Codeine; Sulfonamide derivatives; Amiodarone; and Lactose intolerance (gi)   Past Medical History:  Diagnosis Date  . Allergy   . Anxiety   . Arthritis   . Atrial  enlargement, left   . CHF (congestive heart failure) ([place])   . Coronary artery disease   . Depression   . Diabetes mellitus without complication (HCC)    fasting cbg 110s  . Dyspnea   . GERD (gastroesophageal reflux disease)   . H/O hiatal hernia   . Headache(784.0)   . Heart murmur    06/10/2014 seeing new cardiologist  . Heart murmur   . HOH (hard of hearing)    HOH in left ear; needs to speak to patient in right ear  . Hyperlipidemia   . Hypertension   . Mitral regurgitation   . Mitral valve prolapse   . Palpitations    afib on 03/04/17 EKG  . PONV (postoperative nausea and vomiting)    Patient stated "I like the patch behind my ear"  . 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 S/P mitral valve replacement with bioprosthetic valve 04/23/2017   [facility] Mitral bovine pericardial tissue valve: Model 7300TFX, Serial [id number], size 31  . UTI (lower urinary tract infection)     Past Surgical History:  Procedure Laterality Date  . ABDOMINAL HYSTERECTOMY    . Anterior posterior and enterocele repairs  09/21/2004   With uterosacral cardinal colposuspension, partial colpocleisis; Selinda Orion, MD  . BACK SURGERY    . BREAST ENHANCEMENT SURGERY  02/25/2002   Bilateral reduction and excision of accessory breast tissue underneath the left breast; Aretha Parrot., MD  . BREAST SURGERY  11   reduction  . CARDIAC CATHETERIZATION     no PCI  . CARDIAC CATHETERIZATION  05/02/2017  . CATARACT EXTRACTION Bilateral   . CLIPPING OF ATRIAL APPENDAGE  04/23/2017   Procedure: CLIPPING OF LEFT ATRIAL APPENDAGE;  Surgeon: [name], MD;  Location: [place];  Service: Open Heart Surgery;;  . COLONOSCOPY    . CORONARY ARTERY BYPASS GRAFT N/A 04/23/2017   Procedure: CORONARY ARTERY BYPASS GRAFTING (CABG), ON PUMP, TIMES ONE, USING ENDOSCOPICALLY HARVESTED RIGHT GREATER SAPHENOUS VEIN;  Surgeon: [name], MD;  Location: [place];  Service: Open Heart  Surgery;  Laterality: N/A;  . EYE SURGERY Bilateral    cataract removal  . LUMBAR LAMINECTOMY/ DECOMPRESSION WITH MET-RX N/A 03/16/2014   Procedure: LUMBAR FIVE-SACRAL ONE EXTRAFORAMINAL DISKECTOMY WITH METREX;  Surgeon: Kristeen Miss, MD;  Location: [place] NEURO ORS;  Service: Neurosurgery;  Laterality: N/A;  . LUMBAR LAMINECTOMY/DECOMPRESSION MICRODISCECTOMY Left 06/08/2014   Procedure: Left Lumbar Five-Sacral One Microdiskectomy;  Surgeon: Kristeen Miss, MD;  Location: [place] NEURO ORS;  Service: Neurosurgery;  Laterality: Left;  Left L5-S1 Microdiskectomy  . MITRAL VALVE REPLACEMENT N/A 04/23/2017   Procedure: MITRAL VALVE (MV) REPLACEMENT;  Surgeon: [name], MD;  Location:  [place] [facility];  Service: Open Heart Surgery;  Laterality: N/A;  . PACEMAKER IMPLANT N/A 09/16/2017   Procedure: PACEMAKER IMPLANT;  Surgeon: Constance Haw, MD;  Location: [place] CV LAB;  Service: Cardiovascular;  Laterality: N/A;  . PELVIC FLOOR REPAIR    . POSTERIOR LUMBAR FUSION 4 LEVEL N/A 08/08/2015   Procedure: T8-L1 posterior lateral fusion with decompression T12-L1;  Surgeon: Kristeen Miss, MD;  Location: [place] NEURO ORS;  Service: Neurosurgery;  Laterality: N/A;  T8-L1 posterior lateral fusion with decompression T12-L1  . RIGHT/LEFT HEART CATH AND CORONARY ANGIOGRAPHY N/A 03/28/2017   Procedure: Right/Left Heart Cath and Coronary Angiography;  Surgeon: Nelva Bush, MD;  Location: [place] CV LAB;  Service: Cardiovascular;  Laterality: N/A;  . ROTATOR CUFF REPAIR Bilateral    11.12  . TEE WITHOUT CARDIOVERSION N/A 03/04/2017   Procedure: TRANSESOPHAGEAL ECHOCARDIOGRAM (TEE);  Surgeon: Fay Records, MD;  Location: [place];  Service: Cardiovascular;  Laterality: N/A;  . TEE WITHOUT CARDIOVERSION N/A 04/23/2017   Procedure: TRANSESOPHAGEAL ECHOCARDIOGRAM (TEE);  Surgeon: [name], MD;  Location: [place];  Service: Open Heart Surgery;  Laterality: N/A;  . TONSILLECTOMY AND ADENOIDECTOMY       Social  History:  The patient  reports that  has never smoked. she has never used smokeless tobacco. She reports that she does not drink alcohol or use drugs.   Family History:  The patient's family history includes Arthritis in her father and mother; Heart disease in her father; Heart failure in her father; Hyperlipidemia in her father and mother; Hypertension in her mother.    ROS:  Please see the history of present illness. All other systems are reviewed and  Negative to the above problem except as noted.    PHYSICAL EXAM: VS:  BP 118/60   Pulse 66   Ht _0  (1.676 m)   Wt 130 lb (59 kg)   SpO2 99%   BMI 20.98 kg/m   GEN:  Thin 84 yo in no acute distress  HEENT: normal  Neck: JVP normal  , carotid bruits, or masses Cardiac: RRR; IIVI systolic murmur left lateral chest No rub, or gallops,no LE edema  Respiratory:  Rel clear  No rales   GI: soft, nontender, nondistended, + BS  No hepatomegaly  Lori: no deformity Moving all extremities   Skin: warm and dry, no rash Neuro:  Strength and sensation are intact Psych: euthymic mood, full affect   EKG:  EKG is not ordered today.    Lipid Panel    Component Value Date/Time   CHOL 163 08/22/2016 0746   TRIG 101 08/22/2016 0746   HDL 65 08/22/2016 0746   CHOLHDL 2.5 08/22/2016 0746   VLDL 20 08/22/2016 0746   LDLCALC 78 08/22/2016 0746   LDLDIRECT 143.6 06/04/2012 0931      Wt Readings from Last 3 Encounters:  10/21/17 130 lb (59 kg)  10/15/17 127 lb 3.2 oz (57.7 kg)  09/30/17 126 lb 9.6 oz (57.4 kg)      ASSESSMENT AND PLAN:  1  Chronic systolic CHF.  Last echo in September LVEF 35 to 40%  RVEF reported normal    She has not had any further assessment.  She continues to have  Class  II- III symptoms On exam today volume is not bad  She is wearing an event monitor from Jan (no recordings available)   She has an appt with D Bensimhon later this week I have recomm getting labs today  Will get download from event monitor Decision  will need to be made re pacer  2  Rhythm  EKG not done unfortunately Will get event monitor strips  She is on Toprol XL 25  Decision will need to be made re need for  PPM   Unfort she has another UTI She does not tolerate atrial fibrillation in the past  Did not tolerate amiodarone (junctional + question tremors)    3  CAD  S/p 1 V CABG  NO symptoms of angina  4  MV disease  S/P MVR  Trace MR on echo in September 2018  5  UTI  Pt with another UTI  ON ABX   Followed by B Burchette  Is being sched for appt in Urology   Current medicines are reviewed at length with the patient today.  The patient does not have concerns regarding medicines.  Signed, Dorris Carnes, MD  10/22/2017 8:15 PM    [facility] [address], [place], [place]  [address] Phone: [phone]; Fax: [phone]

## 2017-10-24 ENCOUNTER — Encounter (HOSPITAL_COMMUNITY): Payer: Self-pay | Admitting: Internal Medicine

## 2017-10-24 ENCOUNTER — Ambulatory Visit (HOSPITAL_COMMUNITY)
Admission: RE | Admit: 2017-10-24 | Discharge: 2017-10-24 | Disposition: A | Discharge status: Home / Self Care | Payer: Medicare Other | Source: Ambulatory Visit | Attending: Internal Medicine | Admitting: Internal Medicine

## 2017-10-24 VITALS — BP 116/52 | HR 64 | Wt 129.5 lb

## 2017-10-24 DIAGNOSIS — Z953 Presence of xenogenic heart valve: Secondary | ICD-10-CM | POA: Insufficient documentation

## 2017-10-24 DIAGNOSIS — Z8249 Family history of ischemic heart disease and other diseases of the circulatory system: Secondary | ICD-10-CM | POA: Insufficient documentation

## 2017-10-24 DIAGNOSIS — E119 Type 2 diabetes mellitus without complications: Secondary | ICD-10-CM | POA: Insufficient documentation

## 2017-10-24 DIAGNOSIS — Z882 Allergy status to sulfonamides status: Secondary | ICD-10-CM | POA: Diagnosis not present

## 2017-10-24 DIAGNOSIS — Z7901 Long term (current) use of anticoagulants: Secondary | ICD-10-CM | POA: Insufficient documentation

## 2017-10-24 DIAGNOSIS — I4581 Long QT syndrome: Secondary | ICD-10-CM | POA: Insufficient documentation

## 2017-10-24 DIAGNOSIS — I2510 Atherosclerotic heart disease of native coronary artery without angina pectoris: Secondary | ICD-10-CM | POA: Diagnosis not present

## 2017-10-24 DIAGNOSIS — Z888 Allergy status to other drugs, medicaments and biological substances status: Secondary | ICD-10-CM | POA: Diagnosis not present

## 2017-10-24 DIAGNOSIS — I509 Heart failure, unspecified: Secondary | ICD-10-CM | POA: Diagnosis not present

## 2017-10-24 DIAGNOSIS — I495 Sick sinus syndrome: Secondary | ICD-10-CM | POA: Diagnosis not present

## 2017-10-24 DIAGNOSIS — F419 Anxiety disorder, unspecified: Secondary | ICD-10-CM | POA: Insufficient documentation

## 2017-10-24 DIAGNOSIS — Z885 Allergy status to narcotic agent status: Secondary | ICD-10-CM | POA: Insufficient documentation

## 2017-10-24 DIAGNOSIS — I110 Hypertensive heart disease with heart failure: Secondary | ICD-10-CM | POA: Insufficient documentation

## 2017-10-24 DIAGNOSIS — Z8261 Family history of arthritis: Secondary | ICD-10-CM | POA: Insufficient documentation

## 2017-10-24 DIAGNOSIS — Z951 Presence of aortocoronary bypass graft: Secondary | ICD-10-CM | POA: Insufficient documentation

## 2017-10-24 DIAGNOSIS — I480 Paroxysmal atrial fibrillation: Secondary | ICD-10-CM | POA: Insufficient documentation

## 2017-10-24 DIAGNOSIS — K449 Diaphragmatic hernia without obstruction or gangrene: Secondary | ICD-10-CM | POA: Insufficient documentation

## 2017-10-24 DIAGNOSIS — I340 Nonrheumatic mitral (valve) insufficiency: Secondary | ICD-10-CM | POA: Diagnosis not present

## 2017-10-24 DIAGNOSIS — Z79899 Other long term (current) drug therapy: Secondary | ICD-10-CM | POA: Insufficient documentation

## 2017-10-24 DIAGNOSIS — Z87440 Personal history of urinary (tract) infections: Secondary | ICD-10-CM | POA: Diagnosis not present

## 2017-10-24 DIAGNOSIS — I5022 Chronic systolic (congestive) heart failure: Secondary | ICD-10-CM | POA: Diagnosis not present

## 2017-10-24 DIAGNOSIS — E785 Hyperlipidemia, unspecified: Secondary | ICD-10-CM | POA: Insufficient documentation

## 2017-10-24 DIAGNOSIS — F4321 Adjustment disorder with depressed mood: Secondary | ICD-10-CM | POA: Diagnosis not present

## 2017-10-24 DIAGNOSIS — K219 Gastro-esophageal reflux disease without esophagitis: Secondary | ICD-10-CM | POA: Insufficient documentation

## 2017-10-24 DIAGNOSIS — I341 Nonrheumatic mitral (valve) prolapse: Secondary | ICD-10-CM | POA: Insufficient documentation

## 2017-10-24 DIAGNOSIS — R251 Tremor, unspecified: Secondary | ICD-10-CM | POA: Insufficient documentation

## 2017-10-24 DIAGNOSIS — Z7984 Long term (current) use of oral hypoglycemic drugs: Secondary | ICD-10-CM | POA: Diagnosis not present

## 2017-10-24 DIAGNOSIS — Z91011 Allergy to milk products: Secondary | ICD-10-CM | POA: Insufficient documentation

## 2017-10-24 DIAGNOSIS — I059 Rheumatic mitral valve disease, unspecified: Secondary | ICD-10-CM | POA: Diagnosis not present

## 2017-10-24 NOTE — Progress Notes (Signed)
-  Advanced Heart Failure Clinic Note   Primary Cardiologist: Dr. Harrington Mccarthy Primary HF: Dr. Haroldine Mccarthy   HPI:  [name] is a 84 y.o. female with h/o MVP, HTN, DM2, scoliosis, and CAD. TEE 03/04/17 showed severe MR and severe LAE. L/RHC 03/28/17 with severe multivessel disease. Pt underwent CABG x 1 and MVR replacement 04/23/17. Milrinone added 04/27/17 with Mixed venous saturation of 33%. Course complicated by Afib. Started on IV amiodarone and pt chemically converted to NSR overnight into 05/05/17.  Eventually weaned off milrinone.   Admitted 05/18/17-05/22/17 with AKI. Lasix and [name] were stopped. Required milrinone, and also went back into atrial fibrillation with RVR, but converted to NSR on Amio gtt. Discharged in NSR. Follow up Echo on 05/21/17 showed improved LV function with EF 35-40%. MVR was functioning appropriately.   She presented to the hospital on 1/7 for PPM   Found to be in SR  Had possible UTI  Still complained of fatigue  Procedure cancelled due to infection  Set up for 30 D monitor/   Saw Dr. Harrington Mccarthy earlier this week and no changes made  Here with her husband. Feeling ok. Struggling with another UTI. Wearing monitor. No palpitations or syncope. Walking on TM for 18 mins. Walks almost a mile. No dyspnea until the very end. No edema, orthopnea or PND. Feels somewhat depressed but is taking Celexa.     Echo 05/22/17: LVEF 35-40%, stable MVR.  Echo 04/26/17 LVEF 25-30%, Stable MVR, Moderate TR, Peak PA pressure 38 mm, RV moderately reduced function Echo TEE 03/04/17 LVEF normal, Mild MVP with regurgitant jets, predominant jet posteriorly, Severe LAE.  R/LHC 03/28/17  1. Severe single-vessel coronary artery disease with 60% proximal and 99% distal RCA stenoses as well as chronic total occlusion of the ostial rPDA. The rPDA andr PL branches are supplied by left-to-right collaterals. 2. 70% proximal D2 stenosis is evident. Otherwise, there is mild, nonobstructive disease involving the LAD,  LCx, and their branches. 3. Normal left and right heart filling pressures. Normal pulmonary artery pressure. Normal Fick cardiac output/index. RHC Procedural Findings: Hemodynamics (mmHg) RA mean 4 RV 30/2 PA 28/8 PCWP 7 AO 107/45 Cardiac Output (Fick) 4.67 Cardiac Index (Fick) 2.78  Review of systems complete and found to be negative unless listed in HPI.    Past Medical History:  Diagnosis Date  . Allergy   . Anxiety   . Arthritis   . Atrial enlargement, left   . CHF (congestive heart failure) ([place])   . Coronary artery disease   . Depression   . Diabetes mellitus without complication (HCC)    fasting cbg 110s  . Dyspnea   . GERD (gastroesophageal reflux disease)   . H/O hiatal hernia   . Headache(784.0)   . Heart murmur    06/10/2014 seeing new cardiologist  . Heart murmur   . HOH (hard of hearing)    HOH in left ear; needs to speak to patient in right ear  . Hyperlipidemia   . Hypertension   . Mitral regurgitation   . Mitral valve prolapse   . Palpitations    afib on 03/04/17 EKG  . PONV (postoperative nausea and vomiting)    Patient stated "I like the patch behind my ear"  . 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 S/P mitral valve replacement with bioprosthetic valve 04/23/2017   [facility] Mitral bovine pericardial tissue valve: Model 7300TFX, Serial [id number], size 31  .  UTI (lower urinary tract infection)     Current Outpatient Medications  Medication Sig Dispense Refill  . bisacodyl (DULCOLAX) 5 MG EC tablet Take 10 mg by mouth daily as needed (for constipation.).    [name] conjugated estrogens (PREMARIN) vaginal cream Place 1 Applicatorful vaginally daily. 42.5 g 12  . esomeprazole (NEXIUM) 20 MG capsule Take 20 mg by mouth daily at 12 noon.    . famotidine (PEPCID) 20 MG tablet Take 20 mg by mouth daily.     . fexofenadine (ALLEGRA) 180 MG tablet Take 90 mg by mouth daily as needed for allergies or rhinitis (allergy headache).     . fluticasone (FLONASE) 50 MCG/ACT nasal spray SHAKE LIQUID AND USE 2 SPRAYS IN EACH NOSTRIL DAILY AS NEEDED FOR ALLERGIES 16 g 2  . furosemide (LASIX) 20 MG tablet Take 1 tablet (20 mg total) by mouth as needed for fluid or edema. 30 tablet 6  . metFORMIN (GLUCOPHAGE) 500 MG tablet Take 2 tablets (1,000 mg total) by mouth 2 (two) times daily with a meal. 360 tablet 3  . metoprolol succinate (TOPROL-XL) 25 MG 24 hr tablet Take 1 tablet (25 mg total) by mouth every evening. Take with or immediately following a meal. 90 tablet 2  . Multiple Vitamins-Minerals (ICAPS AREDS 2) CAPS Take 1 capsule by mouth 2 (two) times daily.    [name] DELICA LANCETS 82N MISC USE TO TEST TWICE DAILY AS DIRECTED (Patient taking differently: USE TO TEST ONCE DAILY AS DIRECTED) 100 each 0  . ONETOUCH VERIO test strip TEST BLOOD GLUCOSE TWICE DAILY (Patient taking differently: TEST BLOOD GLUCOSE ONCE DAILY) 200 each 0  . potassium chloride (K-DUR) 10 MEQ tablet Take 1 tablet (10 mEq total) by mouth as needed (WHEN TAKING LASIX). 30 tablet 6  . sacubitril-valsartan (ENTRESTO) 24-26 MG Take 1 tablet by mouth 2 (two) times daily. 90 tablet 3  . trimethoprim (TRIMPEX) 100 MG tablet TAKE 1 TABLET(100 MG) BY MOUTH DAILY 90 tablet 0  . warfarin (COUMADIN) 2.5 MG tablet Take as directed by Coumadin Clinic (Patient taking differently: Take 2.5-3.75 mg by mouth See admin instructions. TAKE 1.5 TABLETS (3.75 MG) DAILY, EXCEPT ON MONDAYS TAKE 1 TABLET (2.5 MG)) 150 tablet 0  . zolpidem (AMBIEN) 5 MG tablet Take 1 tablet (5 mg total) by mouth at bedtime as needed for sleep. 15 tablet 1   No current facility-administered medications for this encounter.    Allergies  Allergen Reactions  . Cortisone Other (See Comments)    Insomnia, heart palpitations (po only)  . Statins Other (See Comments)    Myalgias   . Codeine Swelling    FACIAL SWELLING SEVERITY UNKNOWN  . Sulfonamide Derivatives     UNSPECIFIED REACTION-childhood  reaction  . Amiodarone Other (See Comments)    Tremors with 400 mg BID   . Lactose Intolerance (Gi) Nausea And Vomiting   Social History   Socioeconomic History  . Marital status: Married    Spouse name: Not on file  . Number of children: Not on file  . Years of education: Not on file  . Highest education level: Not on file  Social Needs  . Financial resource strain: Not on file  . Food insecurity - worry: Not on file  . Food insecurity - inability: Not on file  . Transportation needs - medical: Not on file  . Transportation needs - non-medical: Not on file  Occupational History  . Occupation: Retired    Fish farm manager: RETIRED  Tobacco Use  .  Smoking status: Never Smoker  . Smokeless tobacco: Never Used  Substance and Sexual Activity  . Alcohol use: No  . Drug use: No  . Sexual activity: Not on file  Other Topics Concern  . Not on file  Social History Narrative   Married   Regular exercise   Family History  Problem Relation Age of Onset  . Heart failure Father   . Arthritis Father   . Hyperlipidemia Father   . Heart disease Father   . Arthritis Mother   . Hyperlipidemia Mother   . Hypertension Mother    Vitals:   10/24/17 1118  BP: (!) 116/52  Pulse: 64  SpO2: 100%  Weight: 129 lb 8 oz (58.7 kg)    Wt Readings from Last 3 Encounters:  10/24/17 129 lb 8 oz (58.7 kg)  10/21/17 130 lb (59 kg)  10/15/17 127 lb 3.2 oz (57.7 kg)   PHYSICAL EXAM: General:  Elderly Well appearing. No resp difficulty HEENT: normal Neck: supple. no JVD. Carotids 2+ bilat; no bruits. No lymphadenopathy or thryomegaly appreciated. Cor: PMI nondisplaced. Regular rate & rhythm. No rubs, gallops or murmurs. Lungs: clear Abdomen: soft, nontender, nondistended. No hepatosplenomegaly. No bruits or masses. Good bowel sounds. Extremities: no cyanosis, clubbing, rash, trace edema Neuro: alert & orientedx3, cranial nerves grossly intact. moves all 4 extremities w/o difficulty. Affect pleasant  but sad   ECG: NSR 64 1avb (214 ms)  Personally reviewed   ASSESSMENT & PLAN: 1. Chronic Combined Biventricular HF: EF dropped post op-->EF 25-30% with RV dysfunction. Now improved to 35-40% by echo 9/18. - Mildly improved NYHA II-III - Volume status stable on exam.  - Continue Entresto 24/26 mg BID. Will not increase yet as BP soft.  - No need for lasix now - Cotinue low-dose Toprol  - Will avoid [name] with previous AKI and K 4.5 - Repeat echo  - Refuses CR due to cost.    2. CAD s/p CABG x1 04/23/17  - No s/s angina  - Continue ASA and statin.  - Can't afford cardiac rehab due to cost.   3. MVR - bioprosthetic valve and LA clipping.  - Stable - Continue warfarin. Followed at [facility].  - Needs prophylactic ABX for any dental work.  - Followed by coumadin clinic  4. Paroxysmal  A fib with tachy brady syndrome - She tolerates Afib poorly. She is now off Amiodarone with tremors and prolonged QTc on it. Follows with Dr. Curt Bears. Await results of monitor.  - Continue to follow closely.   5. Situational depression - Continue Celexa. - Encouraged her to talk to a counselor    Glori Bickers, MD 10/24/17

## 2017-10-24 NOTE — Patient Instructions (Signed)
Your physician has requested that you have an echocardiogram. Echocardiography is a painless test that uses sound waves to create images of your heart. It provides your doctor with information about the size and shape of your heart and how well your heart's chambers and valves are working. This procedure takes approximately one hour. There are no restrictions for this procedure.  Your physician recommends that you schedule a follow-up appointment in: 3 months  

## 2017-10-24 NOTE — Addendum Note (Signed)
Encounter addended by: Scarlette Calico, RN on: 10/24/2017 12:01 PM  Actions taken: Visit diagnoses modified, Order list changed, Diagnosis association updated, Sign clinical note

## 2017-10-25 ENCOUNTER — Other Ambulatory Visit (HOSPITAL_COMMUNITY): Payer: Self-pay | Admitting: Pharmacist

## 2017-10-25 MED ORDER — SACUBITRIL-VALSARTAN 24-26 MG PO TABS: 1.0000 | ORAL_TABLET | Freq: Two times a day (BID) | ORAL | 3 refills | Status: DC

## 2017-10-29 ENCOUNTER — Other Ambulatory Visit: Payer: Self-pay | Admitting: Internal Medicine

## 2017-10-29 ENCOUNTER — Other Ambulatory Visit: Payer: Self-pay | Admitting: Family Medicine

## 2017-11-04 ENCOUNTER — Telehealth (HOSPITAL_COMMUNITY): Payer: Self-pay | Admitting: Pharmacist

## 2017-11-04 NOTE — Telephone Encounter (Signed)
Verified no charge for [employer] #180 tablets at [facility] on [address].   [name]. Velva Harman, PharmD, BCPS, CPP Clinical Pharmacist Phone: [phone] 11/04/2017 3:00 PM

## 2017-11-12 ENCOUNTER — Ambulatory Visit (INDEPENDENT_AMBULATORY_CARE_PROVIDER_SITE_OTHER): Payer: Medicare Other | Admitting: *Deleted

## 2017-11-12 DIAGNOSIS — I481 Persistent atrial fibrillation: Secondary | ICD-10-CM

## 2017-11-12 DIAGNOSIS — Z7901 Long term (current) use of anticoagulants: Secondary | ICD-10-CM | POA: Diagnosis not present

## 2017-11-12 DIAGNOSIS — I4891 Unspecified atrial fibrillation: Secondary | ICD-10-CM

## 2017-11-12 DIAGNOSIS — I059 Rheumatic mitral valve disease, unspecified: Secondary | ICD-10-CM | POA: Diagnosis not present

## 2017-11-12 DIAGNOSIS — Z953 Presence of xenogenic heart valve: Secondary | ICD-10-CM | POA: Diagnosis not present

## 2017-11-12 DIAGNOSIS — I080 Rheumatic disorders of both mitral and aortic valves: Secondary | ICD-10-CM | POA: Diagnosis not present

## 2017-11-12 DIAGNOSIS — I4819 Other persistent atrial fibrillation: Secondary | ICD-10-CM

## 2017-11-12 DIAGNOSIS — Z5181 Encounter for therapeutic drug level monitoring: Secondary | ICD-10-CM

## 2017-11-12 LAB — POCT INR: INR: 1.5

## 2017-11-12 NOTE — Patient Instructions (Signed)
Description   Today March 5th take 2 tablets (5mg ) then tomorrow March 6th take 2 tablets (5mg ) then continue taking 1 and 1/2  tablets daily except 2 tablets on Mondays.  Pt states she needs to stretch visits out secondary to financial reasons. Recheck in 2 weeks.at time of ECHO

## 2017-11-15 ENCOUNTER — Encounter: Payer: Self-pay | Admitting: Cardiology

## 2017-11-15 NOTE — Telephone Encounter (Signed)
NEW MESSAGE   Patient calling to discuss monitor results

## 2017-11-15 NOTE — Telephone Encounter (Signed)
This encounter was created in error - please disregard.

## 2017-11-25 ENCOUNTER — Other Ambulatory Visit (HOSPITAL_COMMUNITY): Payer: Medicare Other

## 2017-11-29 ENCOUNTER — Encounter (INDEPENDENT_AMBULATORY_CARE_PROVIDER_SITE_OTHER): Payer: Self-pay

## 2017-11-29 ENCOUNTER — Ambulatory Visit (HOSPITAL_COMMUNITY): Payer: Medicare Other | Attending: Cardiovascular Disease

## 2017-11-29 ENCOUNTER — Ambulatory Visit (INDEPENDENT_AMBULATORY_CARE_PROVIDER_SITE_OTHER): Payer: Medicare Other | Admitting: *Deleted

## 2017-11-29 ENCOUNTER — Other Ambulatory Visit: Payer: Self-pay

## 2017-11-29 DIAGNOSIS — Z952 Presence of prosthetic heart valve: Secondary | ICD-10-CM | POA: Diagnosis not present

## 2017-11-29 DIAGNOSIS — I059 Rheumatic mitral valve disease, unspecified: Secondary | ICD-10-CM

## 2017-11-29 DIAGNOSIS — Z7901 Long term (current) use of anticoagulants: Secondary | ICD-10-CM

## 2017-11-29 DIAGNOSIS — E119 Type 2 diabetes mellitus without complications: Secondary | ICD-10-CM | POA: Diagnosis not present

## 2017-11-29 DIAGNOSIS — I4891 Unspecified atrial fibrillation: Secondary | ICD-10-CM

## 2017-11-29 DIAGNOSIS — I4819 Other persistent atrial fibrillation: Secondary | ICD-10-CM

## 2017-11-29 DIAGNOSIS — E785 Hyperlipidemia, unspecified: Secondary | ICD-10-CM | POA: Diagnosis not present

## 2017-11-29 DIAGNOSIS — M419 Scoliosis, unspecified: Secondary | ICD-10-CM | POA: Diagnosis not present

## 2017-11-29 DIAGNOSIS — I10 Essential (primary) hypertension: Secondary | ICD-10-CM | POA: Diagnosis not present

## 2017-11-29 DIAGNOSIS — Z953 Presence of xenogenic heart valve: Secondary | ICD-10-CM

## 2017-11-29 DIAGNOSIS — I480 Paroxysmal atrial fibrillation: Secondary | ICD-10-CM | POA: Insufficient documentation

## 2017-11-29 LAB — POCT INR: INR: 1.3

## 2017-11-29 NOTE — Patient Instructions (Addendum)
Description   Today and tomorrow take 2 tablets (5mg ) then start taking 1 and 1/2 tablets daily except 2 tablets on Mondays and Fridays.  Pt states she needs to stretch visits out secondary to financial reasons. Recheck in 10 days per pt request, advised of risks.

## 2017-12-02 ENCOUNTER — Other Ambulatory Visit: Payer: Self-pay | Admitting: Pharmacist

## 2017-12-02 MED ORDER — WARFARIN SODIUM 2.5 MG PO TABS: ORAL_TABLET | ORAL | 0 refills | Status: DC

## 2017-12-04 ENCOUNTER — Other Ambulatory Visit: Payer: Self-pay | Admitting: Family Medicine

## 2017-12-04 DIAGNOSIS — Z139 Encounter for screening, unspecified: Secondary | ICD-10-CM

## 2017-12-09 ENCOUNTER — Ambulatory Visit (INDEPENDENT_AMBULATORY_CARE_PROVIDER_SITE_OTHER): Payer: Medicare Other | Admitting: *Deleted

## 2017-12-09 DIAGNOSIS — Z953 Presence of xenogenic heart valve: Secondary | ICD-10-CM | POA: Diagnosis not present

## 2017-12-09 DIAGNOSIS — I059 Rheumatic mitral valve disease, unspecified: Secondary | ICD-10-CM | POA: Diagnosis not present

## 2017-12-09 DIAGNOSIS — Z7901 Long term (current) use of anticoagulants: Secondary | ICD-10-CM

## 2017-12-09 DIAGNOSIS — I481 Persistent atrial fibrillation: Secondary | ICD-10-CM | POA: Diagnosis not present

## 2017-12-09 DIAGNOSIS — I4819 Other persistent atrial fibrillation: Secondary | ICD-10-CM

## 2017-12-09 DIAGNOSIS — I4891 Unspecified atrial fibrillation: Secondary | ICD-10-CM | POA: Diagnosis not present

## 2017-12-09 LAB — POCT INR: INR: 1.4

## 2017-12-09 NOTE — Patient Instructions (Signed)
Description   Since you took 2 tablets yesterday, take 2.5 tablets today then start taking 1 and 1/2 tablets daily except 2 tablets on Mondays, Wednesdays, and Fridays.  Pt states she needs to stretch visits out secondary to financial reasons. Recheck in 14 days per pt request-risks advised.

## 2017-12-18 ENCOUNTER — Other Ambulatory Visit: Payer: Self-pay | Admitting: Family Medicine

## 2017-12-24 ENCOUNTER — Ambulatory Visit (INDEPENDENT_AMBULATORY_CARE_PROVIDER_SITE_OTHER): Payer: Medicare Other

## 2017-12-24 DIAGNOSIS — Z7901 Long term (current) use of anticoagulants: Secondary | ICD-10-CM | POA: Diagnosis not present

## 2017-12-24 DIAGNOSIS — Z953 Presence of xenogenic heart valve: Secondary | ICD-10-CM

## 2017-12-24 DIAGNOSIS — I4891 Unspecified atrial fibrillation: Secondary | ICD-10-CM | POA: Diagnosis not present

## 2017-12-24 DIAGNOSIS — I481 Persistent atrial fibrillation: Secondary | ICD-10-CM | POA: Diagnosis not present

## 2017-12-24 DIAGNOSIS — I059 Rheumatic mitral valve disease, unspecified: Secondary | ICD-10-CM

## 2017-12-24 DIAGNOSIS — I4819 Other persistent atrial fibrillation: Secondary | ICD-10-CM

## 2017-12-24 LAB — POCT INR: INR: 1.4

## 2017-12-24 NOTE — Patient Instructions (Signed)
Description   Take 2.5 tablets today then start taking 2 tablets daily except 1.5 tablets on Sundays, Tuesdays, and Thursdays.  Pt states she needs to stretch visits out secondary to financial reasons. Recheck in 14 days per pt request-risks advised.

## 2017-12-31 ENCOUNTER — Ambulatory Visit: Payer: Medicare Other

## 2018-01-03 ENCOUNTER — Ambulatory Visit: Payer: Medicare Other | Admitting: Family Medicine

## 2018-01-03 ENCOUNTER — Encounter

## 2018-01-03 DIAGNOSIS — Z0289 Encounter for other administrative examinations: Secondary | ICD-10-CM

## 2018-01-06 ENCOUNTER — Other Ambulatory Visit: Payer: Self-pay | Admitting: Family Medicine

## 2018-01-07 ENCOUNTER — Ambulatory Visit (INDEPENDENT_AMBULATORY_CARE_PROVIDER_SITE_OTHER): Payer: Medicare Other | Admitting: Family Medicine

## 2018-01-07 ENCOUNTER — Encounter: Payer: Self-pay | Admitting: Family Medicine

## 2018-01-07 ENCOUNTER — Ambulatory Visit (INDEPENDENT_AMBULATORY_CARE_PROVIDER_SITE_OTHER): Payer: Medicare Other | Admitting: Pharmacist

## 2018-01-07 VITALS — BP 102/64 | HR 82 | Temp 98.3°F | Wt 135.4 lb

## 2018-01-07 DIAGNOSIS — Z953 Presence of xenogenic heart valve: Secondary | ICD-10-CM | POA: Diagnosis not present

## 2018-01-07 DIAGNOSIS — Z7901 Long term (current) use of anticoagulants: Secondary | ICD-10-CM | POA: Diagnosis not present

## 2018-01-07 DIAGNOSIS — E119 Type 2 diabetes mellitus without complications: Secondary | ICD-10-CM

## 2018-01-07 DIAGNOSIS — I4891 Unspecified atrial fibrillation: Secondary | ICD-10-CM

## 2018-01-07 DIAGNOSIS — I059 Rheumatic mitral valve disease, unspecified: Secondary | ICD-10-CM | POA: Diagnosis not present

## 2018-01-07 DIAGNOSIS — Z87440 Personal history of urinary (tract) infections: Secondary | ICD-10-CM | POA: Diagnosis not present

## 2018-01-07 DIAGNOSIS — I4819 Other persistent atrial fibrillation: Secondary | ICD-10-CM

## 2018-01-07 DIAGNOSIS — I481 Persistent atrial fibrillation: Secondary | ICD-10-CM | POA: Diagnosis not present

## 2018-01-07 DIAGNOSIS — F5104 Psychophysiologic insomnia: Secondary | ICD-10-CM

## 2018-01-07 LAB — POCT GLYCOSYLATED HEMOGLOBIN (HGB A1C): Hemoglobin A1C: 7.2

## 2018-01-07 LAB — POCT INR: INR: 1.5

## 2018-01-07 MED ORDER — METFORMIN HCL 500 MG PO TABS: 1000.0000 mg | ORAL_TABLET | Freq: Two times a day (BID) | ORAL | 3 refills | Status: AC

## 2018-01-07 MED ORDER — ONETOUCH DELICA LANCETS 33G MISC: 0 refills | Status: AC

## 2018-01-07 MED ORDER — GLUCOSE BLOOD VI STRP: ORAL_STRIP | 3 refills | Status: DC

## 2018-01-07 MED ORDER — ZOLPIDEM TARTRATE 5 MG PO TABS: 5.0000 mg | ORAL_TABLET | Freq: Every evening | ORAL | 1 refills | Status: DC | PRN

## 2018-01-07 NOTE — Patient Instructions (Signed)
Description   Start taking 2 tablets daily.  Pt states she needs to stretch visits out secondary to financial reasons. Recheck in 14 days per pt request-risks advised.

## 2018-01-07 NOTE — Patient Instructions (Signed)

## 2018-01-07 NOTE — Progress Notes (Signed)
Subjective:     Patient ID: [name], female   DOB: 1934-01-09, 84 y.o.   MRN: [id number]  HPI Patient seen for follow-up type 2 diabetes. Last A1c 6.4%. She states her appetite has increased recently and has had some recent fasting blood sugars and high as 154. These are mostly around 130. She was apprised that she states she eats very little at night after dinner. Denies any polyuria or polydipsia  She has history of some chronic insomnia. She is requesting refill Ambien. We have discouraged regular use. She is getting ready to travel out of town to wedding in his particular requesting this. Tries not to take this every night.  History of recurrent UTI. She's not had any recently. She remains on trimethoprim for prophylaxis.  Past Medical History:  Diagnosis Date  . Allergy   . Anxiety   . Arthritis   . Atrial enlargement, left   . CHF (congestive heart failure) ([place])   . Coronary artery disease   . Depression   . Diabetes mellitus without complication (HCC)    fasting cbg 110s  . Dyspnea   . GERD (gastroesophageal reflux disease)   . H/O hiatal hernia   . Headache(784.0)   . Heart murmur    06/10/2014 seeing new cardiologist  . Heart murmur   . HOH (hard of hearing)    HOH in left ear; needs to speak to patient in right ear  . Hyperlipidemia   . Hypertension   . Mitral regurgitation   . Mitral valve prolapse   . Palpitations    afib on 03/04/17 EKG  . PONV (postoperative nausea and vomiting)    Patient stated "I like the patch behind my ear"  . 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 S/P mitral valve replacement with bioprosthetic valve 04/23/2017   [facility] Mitral bovine pericardial tissue valve: Model 7300TFX, Serial [id number], size 31  . UTI (lower urinary tract infection)    Past Surgical History:  Procedure Laterality Date  . ABDOMINAL HYSTERECTOMY    . Anterior posterior and enterocele repairs  09/21/2004   With uterosacral  cardinal colposuspension, partial colpocleisis; Selinda Orion, MD  . BACK SURGERY    . BREAST ENHANCEMENT SURGERY  02/25/2002   Bilateral reduction and excision of accessory breast tissue underneath the left breast; Aretha Parrot., MD  . BREAST SURGERY  11   reduction  . CARDIAC CATHETERIZATION     no PCI  . CARDIAC CATHETERIZATION  05/02/2017  . CATARACT EXTRACTION Bilateral   . CLIPPING OF ATRIAL APPENDAGE  04/23/2017   Procedure: CLIPPING OF LEFT ATRIAL APPENDAGE;  Surgeon: [name], MD;  Location: [place];  Service: Open Heart Surgery;;  . COLONOSCOPY    . CORONARY ARTERY BYPASS GRAFT N/A 04/23/2017   Procedure: CORONARY ARTERY BYPASS GRAFTING (CABG), ON PUMP, TIMES ONE, USING ENDOSCOPICALLY HARVESTED RIGHT GREATER SAPHENOUS VEIN;  Surgeon: [name], MD;  Location: [place];  Service: Open Heart Surgery;  Laterality: N/A;  . EYE SURGERY Bilateral    cataract removal  . LUMBAR LAMINECTOMY/ DECOMPRESSION WITH MET-RX N/A 03/16/2014   Procedure: LUMBAR FIVE-SACRAL ONE EXTRAFORAMINAL DISKECTOMY WITH METREX;  Surgeon: Kristeen Miss, MD;  Location: [place] [facility];  Service: Neurosurgery;  Laterality: N/A;  . LUMBAR LAMINECTOMY/DECOMPRESSION MICRODISCECTOMY Left 06/08/2014   Procedure: Left Lumbar Five-Sacral One Microdiskectomy;  Surgeon: Kristeen Miss, MD;  Location: [place] [facility]URO OR[facility];  Service: Neurosurgery;  Laterality: Left;  Left L5-S1 Microdiskectomy  . MITRAL VALVE REPLACEMENT N/A 04/23/2017   Procedure: MITRAL VALVE (MV) REPLACEMENT;  Surgeon: [name], MD;  Location: [place];  Service: Open Heart Surgery;  Laterality: N/A;  . PACEMAKER IMPLANT N/A 09/16/2017   Procedure: PACEMAKER IMPLANT;  Surgeon: Constance Haw, MD;  Location: [place] CV LAB;  Service: Cardiovascular;  Laterality: N/A;  . PELVIC FLOOR REPAIR    . POSTERIOR LUMBAR FUSION 4 LEVEL N/A 08/08/2015   Procedure: T8-L1 posterior lateral fusion with decompression T12-L1;  Surgeon: Kristeen Miss, MD;   Location: [place] NEUR[facility];  Service: Neurosurgery;  Laterality: N/A;  T8-L1 posterior lateral fusion with decompression T12-L1  . RIGHT/LEFT HEART CATH AND CORONARY ANGIOGRAPHY N/A 03/28/2017   Procedure: Right/Left Heart Cath and Coronary Angiography;  Surgeon: Nelva Bush, MD;  Location: [place] CV LAB;  Service: Cardiovascular;  Laterality: N/A;  . ROTATOR CUFF REPAIR Bilateral    11.12  . TEE WITHOUT CARDIOVERSION N/A 03/04/2017   Procedure: TRANSESOPHAGEAL ECHOCARDIOGRAM (TEE);  Surgeon: Fay Records, MD;  Location: [place];  Service: Cardiovascular;  Laterality: N/A;  . TEE WITHOUT CARDIOVERSION N/A 04/23/2017   Procedure: TRANSESOPHAGEAL ECHOCARDIOGRAM (TEE);  Surgeon: [name], MD;  Location: [place];  Service: Open Heart Surgery;  Laterality: N/A;  . TONSILLECTOMY AND ADENOIDECTOMY      reports that she has never smoked. She has never used smokeless tobacco. She reports that she does not drink alcohol or use drugs. family history includes Arthritis in her father and mother; Heart disease in her father; Heart failure in her father; Hyperlipidemia in her father and mother; Hypertension in her mother. Allergies  Allergen Reactions  . Cortisone Other (See Comments)    Insomnia, heart palpitations (po only)  . Statins Other (See Comments)    Myalgias   . Codeine Swelling    FACIAL SWELLING SEVERITY UNKNOWN  . Sulfonamide Derivatives     UNSPECIFIED REACTION-childhood reaction  . Amiodarone Other (See Comments)    Tremors with 400 mg BID   . Lactose Intolerance (Gi) Nausea And Vomiting     Review of Systems  Constitutional: Negative for fatigue.  Eyes: Negative for visual disturbance.  Respiratory: Negative for cough, chest tightness, shortness of breath and wheezing.   Cardiovascular: Negative for chest pain, palpitations and leg swelling.  Neurological: Negative for dizziness, seizures, syncope, weakness, light-headedness and headaches.       Objective:    Physical Exam  Constitutional: She appears well-developed and well-nourished.  Eyes: Pupils are equal, round, and reactive to light.  Neck: Neck supple. No JVD present. No thyromegaly present.  Cardiovascular: Normal rate and regular rhythm. Exam reveals no gallop.  Pulmonary/Chest: Effort normal and breath sounds normal. No respiratory distress. She has no wheezes. She has no rales.  Musculoskeletal: She exhibits no edema.  Neurological: She is alert.       Assessment:     #1 type 2 diabetes. A1c today 7.2% which is up from 6.4 last visit.  #2 chronic insomnia  #3 history of recurrent UTI currently asymptomatic    Plan:     -Sleep hygiene discussed. -We recommended avoiding regular use of sedative medications. We did agree to refill Ambien 5 mg to use 1 daily at bedtime as needed -Discussed diabetes and diet in particular. We've not recommended further medication this point given her age. Like to reassess A1c in 3 months. If A1C increasing at that point consider other options. She is not a  good candidate for SGLT 2 medication because of history of recurrent UTIs in the past.  Eulas Post MD [facility]

## 2018-01-15 DIAGNOSIS — M542 Cervicalgia: Secondary | ICD-10-CM | POA: Diagnosis not present

## 2018-01-15 DIAGNOSIS — M47812 Spondylosis without myelopathy or radiculopathy, cervical region: Secondary | ICD-10-CM | POA: Diagnosis not present

## 2018-01-15 DIAGNOSIS — M25552 Pain in left hip: Secondary | ICD-10-CM | POA: Diagnosis not present

## 2018-01-15 DIAGNOSIS — M5416 Radiculopathy, lumbar region: Secondary | ICD-10-CM | POA: Diagnosis not present

## 2018-01-21 ENCOUNTER — Ambulatory Visit (INDEPENDENT_AMBULATORY_CARE_PROVIDER_SITE_OTHER): Payer: Medicare Other | Admitting: *Deleted

## 2018-01-21 DIAGNOSIS — Z5181 Encounter for therapeutic drug level monitoring: Secondary | ICD-10-CM | POA: Diagnosis not present

## 2018-01-21 DIAGNOSIS — Z953 Presence of xenogenic heart valve: Secondary | ICD-10-CM

## 2018-01-21 DIAGNOSIS — I4891 Unspecified atrial fibrillation: Secondary | ICD-10-CM

## 2018-01-21 DIAGNOSIS — I059 Rheumatic mitral valve disease, unspecified: Secondary | ICD-10-CM | POA: Diagnosis not present

## 2018-01-21 DIAGNOSIS — I481 Persistent atrial fibrillation: Secondary | ICD-10-CM | POA: Diagnosis not present

## 2018-01-21 DIAGNOSIS — I4819 Other persistent atrial fibrillation: Secondary | ICD-10-CM

## 2018-01-21 DIAGNOSIS — Z7901 Long term (current) use of anticoagulants: Secondary | ICD-10-CM | POA: Diagnosis not present

## 2018-01-21 LAB — POCT INR: INR: 1.6

## 2018-01-21 MED ORDER — WARFARIN SODIUM 2.5 MG PO TABS: ORAL_TABLET | ORAL | 0 refills | Status: DC

## 2018-01-21 NOTE — Patient Instructions (Signed)
Description   Today May 14 th take 2 and 1/2 tablets (6.25mg ) then change coumadin dose to  2 tablets  (5mg ) daily.except take 2 and 1/2 tablets (6.25mg ) on Saturdays   Recheck INR on May 23rd as she will be out of town

## 2018-01-22 ENCOUNTER — Ambulatory Visit (INDEPENDENT_AMBULATORY_CARE_PROVIDER_SITE_OTHER): Payer: Medicare Other | Admitting: Family Medicine

## 2018-01-22 ENCOUNTER — Encounter: Payer: Self-pay | Admitting: Family Medicine

## 2018-01-22 VITALS — BP 110/70 | HR 63 | Temp 98.1°F | Wt 137.6 lb

## 2018-01-22 DIAGNOSIS — N952 Postmenopausal atrophic vaginitis: Secondary | ICD-10-CM

## 2018-01-22 DIAGNOSIS — E1169 Type 2 diabetes mellitus with other specified complication: Secondary | ICD-10-CM

## 2018-01-22 MED ORDER — ESTROGENS, CONJUGATED 0.625 MG/GM VA CREA: 1.0000 | TOPICAL_CREAM | Freq: Every day | VAGINAL | 12 refills | Status: DC

## 2018-01-22 MED ORDER — SITAGLIPTIN PHOSPHATE 100 MG PO TABS: 100.0000 mg | ORAL_TABLET | Freq: Every day | ORAL | 5 refills | Status: DC

## 2018-01-22 NOTE — Patient Instructions (Signed)
Diabetes Mellitus and Nutrition When you have diabetes (diabetes mellitus), it is very important to have healthy eating habits because your blood sugar (glucose) levels are greatly affected by what you eat and drink. Eating healthy foods in the appropriate amounts, at about the same times every day, can help you:  Control your blood glucose.  Lower your risk of heart disease.  Improve your blood pressure.  Reach or maintain a healthy weight.  Every person with diabetes is different, and each person has different needs for a meal plan. Your health care provider may recommend that you work with a diet and nutrition specialist (dietitian) to make a meal plan that is best for you. Your meal plan may vary depending on factors such as:  The calories you need.  The medicines you take.  Your weight.  Your blood glucose, blood pressure, and cholesterol levels.  Your activity level.  Other health conditions you have, such as heart or kidney disease.  How do carbohydrates affect me? Carbohydrates affect your blood glucose level more than any other type of food. Eating carbohydrates naturally increases the amount of glucose in your blood. Carbohydrate counting is a method for keeping track of how many carbohydrates you eat. Counting carbohydrates is important to keep your blood glucose at a healthy level, especially if you use insulin or take certain oral diabetes medicines. It is important to know how many carbohydrates you can safely have in each meal. This is different for every person. Your dietitian can help you calculate how many carbohydrates you should have at each meal and for snack. Foods that contain carbohydrates include:  Bread, cereal, rice, pasta, and crackers.  Potatoes and corn.  Peas, beans, and lentils.  Milk and yogurt.  Fruit and juice.  Desserts, such as cakes, cookies, ice cream, and candy.  How does alcohol affect me? Alcohol can cause a sudden decrease in blood  glucose (hypoglycemia), especially if you use insulin or take certain oral diabetes medicines. Hypoglycemia can be a life-threatening condition. Symptoms of hypoglycemia (sleepiness, dizziness, and confusion) are similar to symptoms of having too much alcohol. If your health care provider says that alcohol is safe for you, follow these guidelines:  Limit alcohol intake to no more than 1 drink per day for nonpregnant women and 2 drinks per day for men. One drink equals 12 oz of beer, 5 oz of wine, or 1 oz of hard liquor.  Do not drink on an empty stomach.  Keep yourself hydrated with water, diet soda, or unsweetened iced tea.  Keep in mind that regular soda, juice, and other mixers may contain a lot of sugar and must be counted as carbohydrates.  What are tips for following this plan? Reading food labels  Start by checking the serving size on the label. The amount of calories, carbohydrates, fats, and other nutrients listed on the label are based on one serving of the food. Many foods contain more than one serving per package.  Check the total grams (g) of carbohydrates in one serving. You can calculate the number of servings of carbohydrates in one serving by dividing the total carbohydrates by 15. For example, if a food has 30 g of total carbohydrates, it would be equal to 2 servings of carbohydrates.  Check the number of grams (g) of saturated and trans fats in one serving. Choose foods that have low or no amount of these fats.  Check the number of milligrams (mg) of sodium in one serving. Most people   should limit total sodium intake to less than 2,300 mg per day.  Always check the nutrition information of foods labeled as "low-fat" or "nonfat". These foods may be higher in added sugar or refined carbohydrates and should be avoided.  Talk to your dietitian to identify your daily goals for nutrients listed on the label. Shopping  Avoid buying canned, premade, or processed foods. These  foods tend to be high in fat, sodium, and added sugar.  Shop around the outside edge of the grocery store. This includes fresh fruits and vegetables, bulk grains, fresh meats, and fresh dairy. Cooking  Use low-heat cooking methods, such as baking, instead of high-heat cooking methods like deep frying.  Cook using healthy oils, such as olive, canola, or sunflower oil.  Avoid cooking with butter, cream, or high-fat meats. Meal planning  Eat meals and snacks regularly, preferably at the same times every day. Avoid going long periods of time without eating.  Eat foods high in fiber, such as fresh fruits, vegetables, beans, and whole grains. Talk to your dietitian about how many servings of carbohydrates you can eat at each meal.  Eat 4-6 ounces of lean protein each day, such as lean meat, chicken, fish, eggs, or tofu. 1 ounce is equal to 1 ounce of meat, chicken, or fish, 1 egg, or 1/4 cup of tofu.  Eat some foods each day that contain healthy fats, such as avocado, nuts, seeds, and fish. Lifestyle   Check your blood glucose regularly.  Exercise at least 30 minutes 5 or more days each week, or as told by your health care provider.  Take medicines as told by your health care provider.  Do not use any products that contain nicotine or tobacco, such as cigarettes and e-cigarettes. If you need help quitting, ask your health care provider.  Work with a counselor or diabetes educator to identify strategies to manage stress and any emotional and social challenges. What are some questions to ask my health care provider?  Do I need to meet with a diabetes educator?  Do I need to meet with a dietitian?  What number can I call if I have questions?  When are the best times to check my blood glucose? Where to find more information:  American Diabetes Association: diabetes.org/food-and-fitness/food  Academy of Nutrition and Dietetics:  www.eatright.org/resources/health/diseases-and-conditions/diabetes  National Institute of Diabetes and Digestive and Kidney Diseases (NIH): www.niddk.nih.gov/health-information/diabetes/overview/diet-eating-physical-activity Summary  A healthy meal plan will help you control your blood glucose and maintain a healthy lifestyle.  Working with a diet and nutrition specialist (dietitian) can help you make a meal plan that is best for you.  Keep in mind that carbohydrates and alcohol have immediate effects on your blood glucose levels. It is important to count carbohydrates and to use alcohol carefully. This information is not intended to replace advice given to you by your health care provider. Make sure you discuss any questions you have with your health care provider. Document Released: 05/24/2005 Document Revised: 10/01/2016 Document Reviewed: 10/01/2016 Elsevier Interactive Patient Education  2018 Elsevier Inc.  

## 2018-01-22 NOTE — Progress Notes (Signed)
Subjective:     Patient ID: [name], female   DOB: May 21, 1934, 84 y.o.   MRN: [id number]  HPI Patient has history of CAD, atrial fibrillation, hypertension, GERD, type 2 diabetes, essential tremor, lumbar spondylosis, history of mitral valve replacement, history of recurrent depression. She is here to discuss diabetes.  She had recent A1c 7.2% 01/07/18. She is concerned though because she's had some recent fasting blood sugars 150 range which is up for her and also some postprandials 2-3 hours of 220. She's had some increased appetite over recent months and has gained some weight. She feels like she has been fairly careful with diet. She does eat cereal frequently for breakfast. Also fair number of starchy vegetables such as beans and peas.  She is on metformin. She has history of recurrent UTI and is not a good candidate for SGLT 2 class. Is not a great candidate for sulfonylureas because of her age and high risk for hypoglycemia and also concerns for recent weight gain  Patient also requesting refill Premarin vaginal cream. She'll use about once per week for some vaginal dryness. She's had atrophic vaginitis issues in the past  Past Medical History:  Diagnosis Date  . Allergy   . Anxiety   . Arthritis   . Atrial enlargement, left   . CHF (congestive heart failure) ([place])   . Coronary artery disease   . Depression   . Diabetes mellitus without complication (HCC)    fasting cbg 110s  . Dyspnea   . GERD (gastroesophageal reflux disease)   . H/O hiatal hernia   . Headache(784.0)   . Heart murmur    06/10/2014 seeing new cardiologist  . Heart murmur   . HOH (hard of hearing)    HOH in left ear; needs to speak to patient in right ear  . Hyperlipidemia   . Hypertension   . Mitral regurgitation   . Mitral valve prolapse   . Palpitations    afib on 03/04/17 EKG  . PONV (postoperative nausea and vomiting)    Patient stated "I like the patch behind my ear"  . 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 S/P mitral valve replacement with bioprosthetic valve 04/23/2017   [facility] Mitral bovine pericardial tissue valve: Model 7300TFX, Serial [id number], size 31  . UTI (lower urinary tract infection)    Past Surgical History:  Procedure Laterality Date  . ABDOMINAL HYSTERECTOMY    . Anterior posterior and enterocele repairs  09/21/2004   With uterosacral cardinal colposuspension, partial colpocleisis; Selinda Orion, MD  . BACK SURGERY    . BREAST ENHANCEMENT SURGERY  02/25/2002   Bilateral reduction and excision of accessory breast tissue underneath the left breast; Aretha Parrot., MD  . BREAST SURGERY  11   reduction  . CARDIAC CATHETERIZATION     no PCI  . CARDIAC CATHETERIZATION  05/02/2017  . CATARACT EXTRACTION Bilateral   . CLIPPING OF ATRIAL APPENDAGE  04/23/2017   Procedure: CLIPPING OF LEFT ATRIAL APPENDAGE;  Surgeon: [name], MD;  Location: [place];  Service: Open Heart Surgery;;  . COLONOSCOPY    . CORONARY ARTERY BYPASS GRAFT N/A 04/23/2017   Procedure: CORONARY ARTERY BYPASS GRAFTING (CABG), ON PUMP, TIMES ONE, USING ENDOSCOPICALLY HARVESTED RIGHT GREATER SAPHENOUS VEIN;  Surgeon: [name], MD;  Location: [place];  Service: Open Heart Surgery;  Laterality: N/A;  . EYE SURGERY Bilateral  cataract removal  . LUMBAR LAMINECTOMY/ DECOMPRESSION WITH MET-RX N/A 03/16/2014   Procedure: LUMBAR FIVE-SACRAL ONE EXTRAFORAMINAL DISKECTOMY WITH METREX;  Surgeon: Kristeen Miss, MD;  Location: [place] [facility];  Service: Neurosurgery;  Laterality: N/A;  . LUMBAR LAMINECTOMY/DECOMPRESSION MICRODISCECTOMY Left 06/08/2014   Procedure: Left Lumbar Five-Sacral One Microdiskectomy;  Surgeon: Kristeen Miss, MD;  Location: [place] [facility]URO ORS;  Service: Neurosurgery;  Laterality: Left;  Left L5-S1 Microdiskectomy  . MITRAL VALVE REPLACEMENT N/A 04/23/2017   Procedure: MITRAL VALVE (MV) REPLACEMENT;  Surgeon: [name], MD;  Location: [place];  Service: Open Heart Surgery;  Laterality: N/A;  . PACEMAKER IMPLANT N/A 09/16/2017   Procedure: PACEMAKER IMPLANT;  Surgeon: Constance Haw, MD;  Location: [place] CV [facility];  Service: Cardiovascular;  Laterality: N/A;  . PELVIC FLOOR REPAIR    . POSTERIOR LUMBAR FUSION 4 LEVEL N/A 08/08/2015   Procedure: T8-L1 posterior lateral fusion with decompression T12-L1;  Surgeon: Kristeen Miss, MD;  Location: [place] NEUR[facility] ORS;  Service: Neurosurgery;  Laterality: N/A;  T8-L1 posterior lateral fusion with decompression T12-L1  . RIGHT/LEFT HEART CATH AND CORONARY ANGIOGRAPHY N/A 03/28/2017   Procedure: Right/Left Heart Cath and Coronary Angiography;  Surgeon: Nelva Bush, MD;  Location: [place] [facility];  Service: Cardiovascular;  Laterality: N/A;  . ROTATOR CUFF REPAIR Bilateral    11.12  . TEE WITHOUT CARDIOVERSION N/A 03/04/2017   Procedure: TRANSESOPHAGEAL ECHOCARDIOGRAM (TEE);  Surgeon: Fay Records, MD;  Location: [place];  Service: Cardiovascular;  Laterality: N/A;  . TEE WITHOUT CARDIOVERSION N/A 04/23/2017   Procedure: TRANSESOPHAGEAL ECHOCARDIOGRAM (TEE);  Surgeon: [name], MD;  Location: [place];  Service: Open Heart Surgery;  Laterality: N/A;  . TONSILLECTOMY AND ADENOIDECTOMY      reports that she has never smoked. She has never used smokeless tobacco. She reports that she does not drink alcohol or use drugs. family history includes Arthritis in her father and mother; Heart disease in her father; Heart failure in her father; Hyperlipidemia in her father and mother; Hypertension in her mother. Allergies  Allergen Reactions  . Cortisone Other (See Comments)    Insomnia, heart palpitations (po only)  . Statins Other (See Comments)    Myalgias   . Codeine Swelling    FACIAL SWELLING SEVERITY UNKNOWN  . Sulfonamide Derivatives     UNSPECIFIED REACTION-childhood reaction  . Amiodarone Other (See Comments)    Tremors with 400 mg BID   . Lactose Intolerance (Gi)  Nausea And Vomiting       Review of Systems  Constitutional: Negative for chills and fever.  Respiratory: Negative for shortness of breath.   Endocrine: Negative for polydipsia and polyuria.       Objective:   Physical Exam  Constitutional: She appears well-developed and well-nourished.  Cardiovascular: Normal rate and regular rhythm.  Pulmonary/Chest: Effort normal and breath sounds normal. She has no wheezes. She has no rales.  Musculoskeletal: She exhibits no edema.       Assessment:     #1 type 2 diabetes. History of fair control with recent A1c 7.2%. Recent increase in fasting and postprandial blood sugars as above  #2 postmenopausal atrophic vaginitis    Plan:     -Long discussion regarding diabetes. We explained that given her age and comorbidities 7.2% is actually fairly good control and explained that we frequently aim for goal of 7 to 8 A1c for her age. Nevertheless. She is very concerned because some recent trends toward elevated blood sugar probably related  to her weight gain. -We need to avoid SGLT 2 class because of her history of recurrent UTI -Avoid sulfonylurea class because of risk of hypoglycemia. -Discussed possible use of Januvia 100 mg daily. She has had recent normal renal function and had recent echo with ejection fraction 50-55% -reassess within 3 months.  Eulas Post MD [facility] at [facility]

## 2018-01-23 ENCOUNTER — Encounter (HOSPITAL_COMMUNITY): Payer: Self-pay | Admitting: Internal Medicine

## 2018-01-23 ENCOUNTER — Ambulatory Visit (HOSPITAL_COMMUNITY)
Admission: RE | Admit: 2018-01-23 | Discharge: 2018-01-23 | Disposition: A | Discharge status: Home / Self Care | Payer: Medicare Other | Source: Ambulatory Visit | Attending: Internal Medicine | Admitting: Internal Medicine

## 2018-01-23 VITALS — BP 122/64 | HR 61 | Wt 137.4 lb

## 2018-01-23 DIAGNOSIS — I495 Sick sinus syndrome: Secondary | ICD-10-CM | POA: Diagnosis not present

## 2018-01-23 DIAGNOSIS — Z7901 Long term (current) use of anticoagulants: Secondary | ICD-10-CM | POA: Insufficient documentation

## 2018-01-23 DIAGNOSIS — I5082 Biventricular heart failure: Secondary | ICD-10-CM | POA: Insufficient documentation

## 2018-01-23 DIAGNOSIS — E785 Hyperlipidemia, unspecified: Secondary | ICD-10-CM | POA: Insufficient documentation

## 2018-01-23 DIAGNOSIS — I110 Hypertensive heart disease with heart failure: Secondary | ICD-10-CM | POA: Insufficient documentation

## 2018-01-23 DIAGNOSIS — K219 Gastro-esophageal reflux disease without esophagitis: Secondary | ICD-10-CM | POA: Insufficient documentation

## 2018-01-23 DIAGNOSIS — M1990 Unspecified osteoarthritis, unspecified site: Secondary | ICD-10-CM | POA: Insufficient documentation

## 2018-01-23 DIAGNOSIS — I2510 Atherosclerotic heart disease of native coronary artery without angina pectoris: Secondary | ICD-10-CM | POA: Insufficient documentation

## 2018-01-23 DIAGNOSIS — I5022 Chronic systolic (congestive) heart failure: Secondary | ICD-10-CM

## 2018-01-23 DIAGNOSIS — Z7984 Long term (current) use of oral hypoglycemic drugs: Secondary | ICD-10-CM | POA: Insufficient documentation

## 2018-01-23 DIAGNOSIS — Z953 Presence of xenogenic heart valve: Secondary | ICD-10-CM | POA: Insufficient documentation

## 2018-01-23 DIAGNOSIS — Z888 Allergy status to other drugs, medicaments and biological substances status: Secondary | ICD-10-CM | POA: Insufficient documentation

## 2018-01-23 DIAGNOSIS — I059 Rheumatic mitral valve disease, unspecified: Secondary | ICD-10-CM | POA: Diagnosis not present

## 2018-01-23 DIAGNOSIS — I480 Paroxysmal atrial fibrillation: Secondary | ICD-10-CM | POA: Insufficient documentation

## 2018-01-23 DIAGNOSIS — Z882 Allergy status to sulfonamides status: Secondary | ICD-10-CM | POA: Insufficient documentation

## 2018-01-23 DIAGNOSIS — E119 Type 2 diabetes mellitus without complications: Secondary | ICD-10-CM | POA: Diagnosis not present

## 2018-01-23 DIAGNOSIS — F419 Anxiety disorder, unspecified: Secondary | ICD-10-CM | POA: Diagnosis not present

## 2018-01-23 DIAGNOSIS — Z951 Presence of aortocoronary bypass graft: Secondary | ICD-10-CM

## 2018-01-23 DIAGNOSIS — F4321 Adjustment disorder with depressed mood: Secondary | ICD-10-CM | POA: Diagnosis not present

## 2018-01-23 DIAGNOSIS — Z79899 Other long term (current) drug therapy: Secondary | ICD-10-CM | POA: Insufficient documentation

## 2018-01-23 DIAGNOSIS — Z885 Allergy status to narcotic agent status: Secondary | ICD-10-CM | POA: Insufficient documentation

## 2018-01-23 NOTE — Addendum Note (Signed)
Encounter addended by: Scarlette Calico, RN on: 01/23/2018 10:40 AM  Actions taken: Sign clinical note

## 2018-01-23 NOTE — Progress Notes (Addendum)
-  Advanced Heart Failure Clinic Note   Primary Cardiologist: Dr. Harrington Challenger Primary HF: Dr. Haroldine Laws   HPI:  [name] is a 84 y.o. female with h/o MVP, HTN, DM2, scoliosis, and CAD. TEE 03/04/17 showed severe MR and severe LAE. L/RHC 03/28/17 with severe multivessel disease. Pt underwent CABG x 1 and MVR replacement 04/23/17. Milrinone added 04/27/17 with Mixed venous saturation of 33%. Course complicated by Afib. Started on IV amiodarone and pt chemically converted to NSR overnight into 05/05/17.  Eventually weaned off milrinone.   Admitted 05/18/17-05/22/17 with AKI. Lasix and Arlyce Harman were stopped. Required milrinone, and also went back into atrial fibrillation with RVR, but converted to NSR on Amio gtt. Discharged in NSR. Follow up Echo on 05/21/17 showed improved LV function with EF 35-40%. MVR was functioning appropriately.   She presented to the hospital on 1/7 for PPM   Found to be in SR  Had possible UTI  Still complained of fatigue  Procedure cancelled due to infection  Set up for 30 D monitor  Here with her husband. Feeling good. Not doing much. Breathing ok. Says she gets SOB if gets up quickly. Resolves quickly. Mild edema in left leg which is chronic No orthopnea or PND. Gaining weight.   Echo 3/19 EF 50-55%    Echo 05/22/17: LVEF 35-40%, stable MVR.  Echo 04/26/17 LVEF 25-30%, Stable MVR, Moderate TR, Peak PA pressure 38 mm, RV moderately reduced function Echo TEE 03/04/17 LVEF normal, Mild MVP with regurgitant jets, predominant jet posteriorly, Severe LAE.  R/LHC 03/28/17  1. Severe single-vessel coronary artery disease with 60% proximal and 99% distal RCA stenoses as well as chronic total occlusion of the ostial rPDA. The rPDA andr PL branches are supplied by left-to-right collaterals. 2. 70% proximal D2 stenosis is evident. Otherwise, there is mild, nonobstructive disease involving the LAD, LCx, and their branches. 3. Normal left and right heart filling pressures. Normal pulmonary  artery pressure. Normal Fick cardiac output/index. RHC Procedural Findings: Hemodynamics (mmHg) RA mean 4 RV 30/2 PA 28/8 PCWP 7 AO 107/45 Cardiac Output (Fick) 4.67 Cardiac Index (Fick) 2.78  Review of systems complete and found to be negative unless listed in HPI.    Past Medical History:  Diagnosis Date  . Allergy   . Anxiety   . Arthritis   . Atrial enlargement, left   . CHF (congestive heart failure) ([place])   . Coronary artery disease   . Depression   . Diabetes mellitus without complication (HCC)    fasting cbg 110s  . Dyspnea   . GERD (gastroesophageal reflux disease)   . H/O hiatal hernia   . Headache(784.0)   . Heart murmur    06/10/2014 seeing new cardiologist  . Heart murmur   . HOH (hard of hearing)    HOH in left ear; needs to speak to patient in right ear  . Hyperlipidemia   . Hypertension   . Mitral regurgitation   . Mitral valve prolapse   . Palpitations    afib on 03/04/17 EKG  . PONV (postoperative nausea and vomiting)    Patient stated "I like the patch behind my ear"  . 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 S/P mitral valve replacement with bioprosthetic valve 04/23/2017   [facility] Mitral bovine pericardial tissue valve: Model 7300TFX, Serial [id number], size 31  . UTI (lower urinary tract infection)     Current Outpatient Medications  Medication  Sig Dispense Refill  . BIOTIN PO Take by mouth.    . bisacodyl (DULCOLAX) 5 MG EC tablet Take 10 mg by mouth daily as needed (for constipation.).    [name] conjugated estrogens (PREMARIN) vaginal cream Place 1 Applicatorful vaginally daily. 42.5 g 12  . esomeprazole (NEXIUM) 20 MG capsule Take 20 mg by mouth daily at 12 noon.    . famotidine (PEPCID) 20 MG tablet Take 20 mg by mouth daily.     . fexofenadine (ALLEGRA) 180 MG tablet Take 90 mg by mouth daily as needed for allergies or rhinitis (allergy headache).    . fluticasone (FLONASE) 50 MCG/ACT nasal spray SHAKE LIQUID  AND USE 2 SPRAYS IN EACH NOSTRIL DAILY AS NEEDED FOR ALLERGIES 16 g 2  . glucose blood (ONETOUCH VERIO) test strip TEST BLOOD SUGAR TWICE DAILY. DXE11.9 200 each 3  . metFORMIN (GLUCOPHAGE) 500 MG tablet Take 2 tablets (1,000 mg total) by mouth 2 (two) times daily with a meal. 360 tablet 3  . metoprolol succinate (TOPROL-XL) 25 MG 24 hr tablet Take 1 tablet (25 mg total) by mouth every evening. Take with or immediately following a meal. 90 tablet 2  . Multiple Vitamins-Minerals (ICAPS AREDS 2) CAPS Take 1 capsule by mouth 2 (two) times daily.    Glory Rosebush DELICA LANCETS 65H MISC USE TO TEST TWICE DAILY. DX E11.9 100 each 0  . sacubitril-valsartan (ENTRESTO) 24-26 MG Take 1 tablet by mouth 2 (two) times daily. 180 tablet 3  . sitaGLIPtin (JANUVIA) 100 MG tablet Take 1 tablet (100 mg total) by mouth daily. 30 tablet 5  . trimethoprim (TRIMPEX) 100 MG tablet TAKE 1 TABLET(100 MG) BY MOUTH DAILY 90 tablet 0  . warfarin (COUMADIN) 2.5 MG tablet TAKE AS DIRECTED by COUMADIN CLINIC 195 tablet 0  . zolpidem (AMBIEN) 5 MG tablet Take 1 tablet (5 mg total) by mouth at bedtime as needed for sleep. 30 tablet 1   No current facility-administered medications for this encounter.    Allergies  Allergen Reactions  . Cortisone Other (See Comments)    Insomnia, heart palpitations (po only)  . Statins Other (See Comments)    Myalgias   . Codeine Swelling    FACIAL SWELLING SEVERITY UNKNOWN  . Sulfonamide Derivatives     UNSPECIFIED REACTION-childhood reaction  . Amiodarone Other (See Comments)    Tremors with 400 mg BID   . Lactose Intolerance (Gi) Nausea And Vomiting   Social History   Socioeconomic History  . Marital status: Married    Spouse name: Not on file  . Number of children: Not on file  . Years of education: Not on file  . Highest education level: Not on file  Occupational History  . Occupation: Retired    Fish farm manager: RETIRED  Social Needs  . Financial resource strain: Not on file  .  Food insecurity:    Worry: Not on file    Inability: Not on file  . Transportation needs:    Medical: Not on file    Non-medical: Not on file  Tobacco Use  . Smoking status: Never Smoker  . Smokeless tobacco: Never Used  Substance and Sexual Activity  . Alcohol use: No  . Drug use: No  . Sexual activity: Not on file  Lifestyle  . Physical activity:    Days per week: Not on file    Minutes per session: Not on file  . Stress: Not on file  Relationships  . Social connections:    Talks  on phone: Not on file    Gets together: Not on file    Attends religious service: Not on file    Active member of club or organization: Not on file    Attends meetings of clubs or organizations: Not on file    Relationship status: Not on file  . Intimate partner violence:    Fear of current or ex partner: Not on file    Emotionally abused: Not on file    Physically abused: Not on file    Forced sexual activity: Not on file  Other Topics Concern  . Not on file  Social History Narrative   Married   Regular exercise   Family History  Problem Relation Age of Onset  . Heart failure Father   . Arthritis Father   . Hyperlipidemia Father   . Heart disease Father   . Arthritis Mother   . Hyperlipidemia Mother   . Hypertension Mother    Vitals:   01/23/18 1007  BP: 122/64  Pulse: 61  SpO2: 97%  Weight: 137 lb 6.4 oz (62.3 kg)    Wt Readings from Last 3 Encounters:  01/23/18 137 lb 6.4 oz (62.3 kg)  01/22/18 137 lb 9.6 oz (62.4 kg)  01/07/18 135 lb 6.4 oz (61.4 kg)   PHYSICAL EXAM: General:  Well appearing. No resp difficulty HEENT: normal Neck: supple. no JVD. Carotids 2+ bilat; no bruits. No lymphadenopathy or thryomegaly appreciated. Cor: PMI nondisplaced. Regular rate & rhythm. No rubs, gallops or murmurs. Lungs: clear Abdomen: soft, nontender, nondistended. No hepatosplenomegaly. No bruits or masses. Good bowel sounds. Extremities: no cyanosis, clubbing, rash, mild edema on L  with varicose veins.  Neuro: alert & orientedx3, cranial nerves grossly intact. moves all 4 extremities w/o difficulty. Affect pleasant   ASSESSMENT & PLAN: 1. Chronic Combined Biventricular HF: EF dropped post op-->EF 25-30% with RV dysfunction. Now improved to 35-40% by echo 9/18. EF 50-55% 2/19 - EF now back to normal. Volume status looks good.  - Stable NYHA II-early III  - Volume status stable on exam.  - Continue Entresto 24/26 mg BID. - No need for lasix now - Cotinue low-dose Toprol  - Needs to be more active   2. CAD s/p CABG x1 04/23/17  - No s/s angina - Continue ASA and statin.  - Can't afford cardiac rehab due to cost.   3. MVR - bioprosthetic valve and LA clipping.  - Stable - Continue warfarin. Followed at [facility].  - Needs prophylactic ABX for any dental work.  - Followed by coumadin clinic  4. Paroxysmal  A fib with tachy brady syndrome - She tolerates Afib poorly. She is now off Amiodarone with tremors and prolonged QTc on it. Follows with Dr. Curt Bears.  - Remains in NSR.  - Monitor in 3/19 no AF - AF was only post-op. No recurrence. Can likely stop coumadin will d/w Dr. Harrington Challenger and [place]  5. Situational depression - Continue Celexa. - Improved   F/u with Dr. Harrington Challenger. Can see back in HF Clinic as needed  Glori Bickers, MD 01/23/18

## 2018-01-23 NOTE — Addendum Note (Signed)
Encounter addended by: Scarlette Calico, RN on: 01/23/2018 10:43 AM  Actions taken: Visit diagnoses modified, Order list changed, Diagnosis association updated

## 2018-01-23 NOTE — Patient Instructions (Signed)
You have been referred to Dr Harrington Challenger at [facility]: [address], [place], [place] [address]  [phone]  They will call you to schedule an appointment in 4 months.

## 2018-01-24 ENCOUNTER — Telehealth: Payer: Self-pay | Admitting: Internal Medicine

## 2018-01-24 NOTE — Telephone Encounter (Signed)
Seen by Dr. Haroldine Laws yesterday and was told she may be able to stop Coumadin and that this would be reviewed by Dr. Harrington Challenger to decide. Pt is anxious to come off coumadin. Pt aware Dr. Harrington Challenger is not in office today but that I will send her a message. Advised we would call her back as soon as we hear back from Dr. Harrington Challenger.

## 2018-01-24 NOTE — Telephone Encounter (Signed)
New Message   Pt c/o medication issue:  1. Name of Medication: warfarin (COUMADIN) 2.5 MG tablet  2. How are you currently taking this medication (dosage and times per day)? TAKE AS DIRECTED by [facility] CLINIC  3. Are you having a reaction (difficulty breathing--STAT)? no  4. What is your medication issue? Pt states that Benshimon wants to take her off coumadin but wants her to check with Dr. Harrington Challenger first. Please call

## 2018-01-27 NOTE — Telephone Encounter (Signed)
Thanks for the update.  Made a note on the pt's next appt regarding this. Please update Anticoagulation Clinic & Thanks.

## 2018-01-28 NOTE — Telephone Encounter (Signed)
Pt seen by D Bensimhon and W Camnitz     Monitor in Jan 2019 without afib    Echo LVEF normla   Mild LA enlargement (09/2017)  Afib occurred immed postop and in first month out (Sept 2018)  Need to watch for recurrence  Can stop coumadin BUT  If any palpitations contract office Do not want to mis  May need periodic monitors for rhtym    Make sure appt with me in next month

## 2018-01-29 MED ORDER — ASPIRIN EC 81 MG PO TBEC: 81.0000 mg | DELAYED_RELEASE_TABLET | Freq: Every day | ORAL | 3 refills | Status: AC

## 2018-01-29 NOTE — Telephone Encounter (Signed)
Spoke with patient. She will stop coumadin. Advised I will call her with appointment in late June with Dr. Harrington Challenger. She is aware to contact office to evaluate if any palpitations.   Will update anticoag clinic.

## 2018-01-29 NOTE — Addendum Note (Signed)
Addended by: Rodman Key on: 01/29/2018 01:55 PM   Modules accepted: Orders

## 2018-01-29 NOTE — Telephone Encounter (Signed)
Yes

## 2018-01-29 NOTE — Telephone Encounter (Signed)
Pt states she was instructed by Dr. Haroldine Laws to take a baby aspirin if Dr. Harrington Challenger discontinues warfarin. Advised patient to start baby asa and that I will make sure this is ok with Dr. Harrington Challenger.  If not, I will call her back.

## 2018-02-13 DIAGNOSIS — R293 Abnormal posture: Secondary | ICD-10-CM | POA: Diagnosis not present

## 2018-02-13 DIAGNOSIS — M6281 Muscle weakness (generalized): Secondary | ICD-10-CM | POA: Diagnosis not present

## 2018-02-13 DIAGNOSIS — M542 Cervicalgia: Secondary | ICD-10-CM | POA: Diagnosis not present

## 2018-02-13 DIAGNOSIS — M2560 Stiffness of unspecified joint, not elsewhere classified: Secondary | ICD-10-CM | POA: Diagnosis not present

## 2018-03-04 ENCOUNTER — Encounter: Payer: Self-pay | Admitting: Internal Medicine

## 2018-03-04 ENCOUNTER — Ambulatory Visit: Payer: Medicare Other | Admitting: Internal Medicine

## 2018-03-04 VITALS — BP 100/60 | HR 67 | Ht 66.0 in | Wt 140.8 lb

## 2018-03-04 DIAGNOSIS — I5022 Chronic systolic (congestive) heart failure: Secondary | ICD-10-CM | POA: Diagnosis not present

## 2018-03-04 DIAGNOSIS — I480 Paroxysmal atrial fibrillation: Secondary | ICD-10-CM | POA: Diagnosis not present

## 2018-03-04 DIAGNOSIS — Z953 Presence of xenogenic heart valve: Secondary | ICD-10-CM

## 2018-03-04 DIAGNOSIS — I2510 Atherosclerotic heart disease of native coronary artery without angina pectoris: Secondary | ICD-10-CM

## 2018-03-04 LAB — BASIC METABOLIC PANEL
BUN / CREAT RATIO: 19 (ref 12–28)
BUN: 18 mg/dL (ref 8–27)
CALCIUM: 9.4 mg/dL (ref 8.7–10.3)
CHLORIDE: 97 mmol/L (ref 96–106)
CO2: 20 mmol/L (ref 20–29)
Creatinine, Ser: 0.96 mg/dL (ref 0.57–1.00)
GFR calc non Af Amer: 55 mL/min/{1.73_m2} — ABNORMAL LOW (ref >59)
GFR, EST AFRICAN AMERICAN: 64 mL/min/{1.73_m2} (ref >59)
Glucose: 197 mg/dL — ABNORMAL HIGH (ref 65–99)
POTASSIUM: 5.1 mmol/L (ref 3.5–5.2)
Sodium: 132 mmol/L — ABNORMAL LOW (ref 134–144)

## 2018-03-04 LAB — CBC
Hematocrit: 34 % (ref 34.0–46.6)
Hemoglobin: 11.2 g/dL (ref 11.1–15.9)
MCH: 31.7 pg (ref 26.6–33.0)
MCHC: 32.9 g/dL (ref 31.5–35.7)
MCV: 96 fL (ref 79–97)
PLATELETS: 263 10*3/uL (ref 150–450)
RBC: 3.53 x10E6/uL — ABNORMAL LOW (ref 3.77–5.28)
RDW: 13.9 % (ref 12.3–15.4)
WBC: 5.1 10*3/uL (ref 3.4–10.8)

## 2018-03-04 LAB — PRO B NATRIURETIC PEPTIDE: NT-Pro BNP: 268 pg/mL (ref 0–738)

## 2018-03-04 NOTE — Progress Notes (Addendum)
Cardiology Office Note   Date:  03/04/2018   ID:  [name], [name] [birth year], MRN [id number]  PCP:  Eulas Post, MD  Cardiologist:   Dorris Carnes, MD   F/U of  CHF and MV dz      History of Present Illness: [name] is a 84 y.o. female with a history of CAD and MVP  She is s/p CABG (SVG to RCA) and MVR with bioprostheiss and L atrial appendage clipping in Aug 2018.  Post op course significant for CHF with echo showing LVEF 25 to 30%.  Followed by CHF service  Required inotropic support.  Pt also developed atrial fibrillation  Treated with amiodarone and coumadin.   In September amiodarone was increased due to recurrent atrial fibrillation.   She was hsopitallied again in September 2018 with frecurrent atrial fib with RVR  Placed on milrinone for low output state.  Converted to SR with IV amiodaone.  Echo on 9.11.18 LVEF 35 to 40%  MVR functioning OK She was seen by B Bhagat in October  Found to be in a junctional rhythm Also had tremors Amiodarone was stopped. Seen by D Bensimhon on 07/10/17  Referred to EP for consideration of PPM since afib poorly tolerated The pt was seen by Elliot Cousin in November  Stated on metoprolol  Seen back on 12/20   Remained in junctional rhythm   Set up for PPM She presented to the hospital on 1/7 for PPM    Found to be in SR  Had possible UTI  I saw her in clinic in February SHe reported  fatigue, SOB with activity   Denies dizziness  No palpitations   I made no med changes at that visit  She was wearing monitor  She saw D Bensimhon on 10/24/17  He did recomm stopping anticoaguation   She was   Set up for echo  This was done on 3/22   LVEF was 50 to 55% Monitor showed no arrhythmia    Since seen she says she still gives out with activities  She is busy at home caring for husband   She deies CP   Notes occasoinal LLE edema    Current Meds  Medication Sig  . aspirin EC 81 MG tablet Take 1 tablet (81 mg total) by mouth daily.  [name] BIOTIN PO Take by  mouth.  . bisacodyl (DULCOLAX) 5 MG EC tablet Take 10 mg by mouth daily as needed (for constipation.).  [name] conjugated estrogens (PREMARIN) vaginal cream Place 1 Applicatorful vaginally daily.  [name] esomeprazole (NEXIUM) 20 MG capsule Take 20 mg by mouth daily at 12 noon.  . famotidine (PEPCID) 20 MG tablet Take 20 mg by mouth daily.   . fexofenadine (ALLEGRA) 180 MG tablet Take 90 mg by mouth daily as needed for allergies or rhinitis (allergy headache).  . fluticasone (FLONASE) 50 MCG/ACT nasal spray SHAKE LIQUID AND USE 2 SPRAYS IN EACH NOSTRIL DAILY AS NEEDED FOR ALLERGIES  . glucose blood (ONETOUCH VERIO) test strip TEST BLOOD SUGAR TWICE DAILY. DXE11.9  . metFORMIN (GLUCOPHAGE) 500 MG tablet Take 2 tablets (1,000 mg total) by mouth 2 (two) times daily with a meal.  . metoprolol succinate (TOPROL-XL) 25 MG 24 hr tablet Take 1 tablet (25 mg total) by mouth every evening. Take with or immediately following a meal.  . Multiple Vitamins-Minerals (ICAPS AREDS 2) CAPS Take 1 capsule by mouth 2 (two) times daily.  Glory Rosebush DELICA LANCETS 29J MISC USE  TO TEST TWICE DAILY. DX E11.9  . sacubitril-valsartan (ENTRESTO) 24-26 MG Take 1 tablet by mouth 2 (two) times daily.  . sitaGLIPtin (JANUVIA) 100 MG tablet Take 1 tablet (100 mg total) by mouth daily.  [name] zolpidem (AMBIEN) 5 MG tablet Take 1 tablet (5 mg total) by mouth at bedtime as needed for sleep.     Allergies:   Cortisone; Statins; Codeine; Sulfonamide derivatives; Amiodarone; and Lactose intolerance (gi)   Past Medical History:  Diagnosis Date  . Allergy   . Anxiety   . Arthritis   . Atrial enlargement, left   . CHF (congestive heart failure) ([place])   . Coronary artery disease   . Depression   . Diabetes mellitus without complication (HCC)    fasting cbg 110s  . Dyspnea   . GERD (gastroesophageal reflux disease)   . H/O hiatal hernia   . Headache(784.0)   . Heart murmur    06/10/2014 seeing new cardiologist  . Heart murmur   . HOH  (hard of hearing)    HOH in left ear; needs to speak to patient in right ear  . Hyperlipidemia   . Hypertension   . Mitral regurgitation   . Mitral valve prolapse   . Palpitations    afib on 03/04/17 EKG  . PONV (postoperative nausea and vomiting)    Patient stated "I like the patch behind my ear"  . 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 S/P mitral valve replacement with bioprosthetic valve 04/23/2017   [facility] Mitral bovine pericardial tissue valve: Model 7300TFX, Serial [id number], size 31  . UTI (lower urinary tract infection)     Past Surgical History:  Procedure Laterality Date  . ABDOMINAL HYSTERECTOMY    . Anterior posterior and enterocele repairs  09/21/2004   With uterosacral cardinal colposuspension, partial colpocleisis; Selinda Orion, MD  . BACK SURGERY    . BREAST ENHANCEMENT SURGERY  02/25/2002   Bilateral reduction and excision of accessory breast tissue underneath the left breast; Aretha Parrot., MD  . BREAST SURGERY  11   reduction  . CARDIAC CATHETERIZATION     no PCI  . CARDIAC CATHETERIZATION  05/02/2017  . CATARACT EXTRACTION Bilateral   . CLIPPING OF ATRIAL APPENDAGE  04/23/2017   Procedure: CLIPPING OF LEFT ATRIAL APPENDAGE;  Surgeon: [name], MD;  Location: [place];  Service: Open Heart Surgery;;  . COLONOSCOPY    . CORONARY ARTERY BYPASS GRAFT N/A 04/23/2017   Procedure: CORONARY ARTERY BYPASS GRAFTING (CABG), ON PUMP, TIMES ONE, USING ENDOSCOPICALLY HARVESTED RIGHT GREATER SAPHENOUS VEIN;  Surgeon: [name], MD;  Location: [place];  Service: Open Heart Surgery;  Laterality: N/A;  . EYE SURGERY Bilateral    cataract removal  . LUMBAR LAMINECTOMY/ DECOMPRESSION WITH MET-RX N/A 03/16/2014   Procedure: LUMBAR FIVE-SACRAL ONE EXTRAFORAMINAL DISKECTOMY WITH METREX;  Surgeon: Kristeen Miss, MD;  Location: [place] [facility];  Service: Neurosurgery;  Laterality: N/A;  . LUMBAR LAMINECTOMY/DECOMPRESSION  MICRODISCECTOMY Left 06/08/2014   Procedure: Left Lumbar Five-Sacral One Microdiskectomy;  Surgeon: Kristeen Miss, MD;  Location: [place] [facility];  Service: Neurosurgery;  Laterality: Left;  Left L5-S1 Microdiskectomy  . MITRAL VALVE REPLACEMENT N/A 04/23/2017   Procedure: MITRAL VALVE (MV) REPLACEMENT;  Surgeon: [name], MD;  Location: [place];  Service: Open Heart Surgery;  Laterality: N/A;  . PACEMAKER IMPLANT N/A 09/16/2017   Procedure: PACEMAKER IMPLANT;  Surgeon: Constance Haw,  MD;  Location: [place] [facility];  Service: Cardiovascular;  Laterality: N/A;  . PELVIC FLOOR REPAIR    . POSTERIOR LUMBAR FUSION 4 LEVEL N/A 08/08/2015   Procedure: T8-L1 posterior lateral fusion with decompression T12-L1;  Surgeon: Kristeen Miss, MD;  Location: [place] [facility];  Service: Neurosurgery;  Laterality: N/A;  T8-L1 posterior lateral fusion with decompression T12-L1  . RIGHT/LEFT HEART CATH AND CORONARY ANGIOGRAPHY N/A 03/28/2017   Procedure: Right/Left Heart Cath and Coronary Angiography;  Surgeon: Nelva Bush, MD;  Location: [place] CV LAB;  Service: Cardiovascular;  Laterality: N/A;  . ROTATOR CUFF REPAIR Bilateral    11.12  . TEE WITHOUT CARDIOVERSION N/A 03/04/2017   Procedure: TRANSESOPHAGEAL ECHOCARDIOGRAM (TEE);  Surgeon: Fay Records, MD;  Location: [place];  Service: Cardiovascular;  Laterality: N/A;  . TEE WITHOUT CARDIOVERSION N/A 04/23/2017   Procedure: TRANSESOPHAGEAL ECHOCARDIOGRAM (TEE);  Surgeon: [name], MD;  Location: [place];  Service: Open Heart Surgery;  Laterality: N/A;  . TONSILLECTOMY AND ADENOIDECTOMY       Social History:  The patient  reports that she has never smoked. She has never used smokeless tobacco. She reports that she does not drink alcohol or use drugs.   Family History:  The patient's family history includes Arthritis in her father and mother; Heart disease in her father; Heart failure in her father; Hyperlipidemia in her father and mother;  Hypertension in her mother.    ROS:  Please see the history of present illness. All other systems are reviewed and  Negative to the above problem except as noted.    PHYSICAL EXAM: VS:  BP 100/60   Pulse 67   Ht '5\' 6"'  (1.676 m)   Wt 63.9 kg (140 lb 12.8 oz)   SpO2 98%   BMI 22.73 kg/m   GEN:  Thin 84 yo in no acute distress  HEENT: normal  Neck: JVP normal  , carotid bruits, or masses Cardiac: RRR; IIVI systolic murmur left lateral chest No rub, or gallops,Trivial  LE edema  Respiratory:  Rel clear  No rales   GI: soft, nontender, nondistended, + BS  No hepatomegaly  MS: no deformity Moving all extremities   Skin: warm and dry, no rash Neuro:  Strength and sensation are intact Psych: euthymic mood, full affect   EKG:  EKG is not ordered today.    Lipid Panel    Component Value Date/Time   CHOL 163 08/22/2016 0746   TRIG 101 08/22/2016 0746   HDL 65 08/22/2016 0746   CHOLHDL 2.5 08/22/2016 0746   VLDL 20 08/22/2016 0746   LDLCALC 78 08/22/2016 0746   LDLDIRECT 143.6 06/04/2012 0931      Wt Readings from Last 3 Encounters:  03/04/18 63.9 kg (140 lb 12.8 oz)  01/23/18 62.3 kg (137 lb 6.4 oz)  01/22/18 62.4 kg (137 lb 9.6 oz)      ASSESSMENT AND PLAN:  1  Chronic systolic CHF.Pt still reports class II to III symtpoms   But, her echo shows LVEF is normal    She is under stress caring for husband.    I would recomm she keep on same meds    2  Rhythm  / Hx atrial fib   CLiically she is in SR  Atrial fib occurred immediately postop   She has had no recurrences   Reasonale to hold anticoagulation    3  CAD  S/p 1 V CABG  NO symptoms of angina  4  MV disease  S/P MVR  Trace MR on echo in September 2018  Will get CBC, BMET, BNP    Pt will need to get lipids on next visit   Not on statin    Current medicines are reviewed at length with the patient today.  The patient does not have concerns regarding medicines.  Signed, Dorris Carnes, MD  03/04/2018 10:33 AM    [address], [place], [place]  [address] Phone: [phone]; Fax: [phone]

## 2018-03-04 NOTE — Patient Instructions (Signed)
Your physician recommends that you continue on your current medications as directed. Please refer to the Current Medication list given to you today.  Your physician recommends that you return for lab work today (CBC, BMET, BNP)  Your physician recommends that you schedule a follow-up appointment in: Oct/Nov with Dr. Harrington Challenger.

## 2018-03-10 ENCOUNTER — Ambulatory Visit
Admission: RE | Admit: 2018-03-10 | Discharge: 2018-03-10 | Disposition: A | Discharge status: Home / Self Care | Payer: Medicare Other | Source: Ambulatory Visit | Attending: Family Medicine | Admitting: Family Medicine

## 2018-03-10 DIAGNOSIS — Z1231 Encounter for screening mammogram for malignant neoplasm of breast: Secondary | ICD-10-CM | POA: Diagnosis not present

## 2018-03-10 DIAGNOSIS — Z139 Encounter for screening, unspecified: Secondary | ICD-10-CM

## 2018-04-03 ENCOUNTER — Other Ambulatory Visit: Payer: Self-pay | Admitting: Cardiology

## 2018-04-08 ENCOUNTER — Ambulatory Visit (INDEPENDENT_AMBULATORY_CARE_PROVIDER_SITE_OTHER): Payer: Medicare Other | Admitting: Family Medicine

## 2018-04-08 ENCOUNTER — Encounter: Payer: Self-pay | Admitting: Family Medicine

## 2018-04-08 VITALS — BP 118/68 | HR 55 | Temp 97.7°F | Ht 66.0 in | Wt 140.9 lb

## 2018-04-08 DIAGNOSIS — E1169 Type 2 diabetes mellitus with other specified complication: Secondary | ICD-10-CM | POA: Diagnosis not present

## 2018-04-08 LAB — POCT GLYCOSYLATED HEMOGLOBIN (HGB A1C): Hemoglobin A1C: 7.6 % — AB (ref 4.0–5.6)

## 2018-04-08 MED ORDER — GLIMEPIRIDE 2 MG PO TABS: 2.0000 mg | ORAL_TABLET | Freq: Every day | ORAL | 3 refills | Status: DC

## 2018-04-08 NOTE — Patient Instructions (Signed)
Be sure to take the Amaryl with breakfast each morning.

## 2018-04-08 NOTE — Progress Notes (Signed)
Subjective:     Patient ID: [name], female   DOB: [birth year], 84 y.o.   MRN: [id number]  HPI Patient seen for follow-up type 2 diabetes. Her chronic problems include history of CAD, atrial fibrillation, chronic systolic heart failure, GERD, type 2 diabetes, familial tremor, hyperlipidemia, macular degeneration.  She is followed by heart failure clinic. She's been on Entresto and has been stable from the standpoint of shortness of breath. Is on metformin. We have discussed cautions with her in the past with his medication and heart failure. Her A1c today 7.6%. She tried Januvia but had issues with cost and also states this does not help her blood sugar much. Her recent fasting sugars around 150. She is concerned because of cost issues as she is currently in the "donut hole".  She has history of frequent UTIs and is not a good candidate for SGLT 2 class  Past Medical History:  Diagnosis Date  . Allergy   . Anxiety   . Arthritis   . Atrial enlargement, left   . CHF (congestive heart failure) ([place])   . Coronary artery disease   . Depression   . Diabetes mellitus without complication (HCC)    fasting cbg 110s  . Dyspnea   . GERD (gastroesophageal reflux disease)   . H/O hiatal hernia   . Headache(784.0)   . Heart murmur    06/10/2014 seeing new cardiologist  . Heart murmur   . HOH (hard of hearing)    HOH in left ear; needs to speak to patient in right ear  . Hyperlipidemia   . Hypertension   . Mitral regurgitation   . Mitral valve prolapse   . Palpitations    afib on 03/04/17 EKG  . PONV (postoperative nausea and vomiting)    Patient stated "I like the patch behind my ear"  . 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 S/P mitral valve replacement with bioprosthetic valve 04/23/2017   [facility] Mitral bovine pericardial tissue valve: Model 7300TFX, Serial [id number], size 31  . UTI (lower urinary tract infection)    Past Surgical History:   Procedure Laterality Date  . ABDOMINAL HYSTERECTOMY    . Anterior posterior and enterocele repairs  09/21/2004   With uterosacral cardinal colposuspension, partial colpocleisis; Selinda Orion, MD  . BACK SURGERY    . BREAST ENHANCEMENT SURGERY  02/25/2002   Bilateral reduction and excision of accessory breast tissue underneath the left breast; Aretha Parrot., MD  . BREAST SURGERY  11   reduction  . CARDIAC CATHETERIZATION     no PCI  . CARDIAC CATHETERIZATION  05/02/2017  . CATARACT EXTRACTION Bilateral   . CLIPPING OF ATRIAL APPENDAGE  04/23/2017   Procedure: CLIPPING OF LEFT ATRIAL APPENDAGE;  Surgeon: [name], MD;  Location: [place];  Service: Open Heart Surgery;;  . COLONOSCOPY    . CORONARY ARTERY BYPASS GRAFT N/A 04/23/2017   Procedure: CORONARY ARTERY BYPASS GRAFTING (CABG), ON PUMP, TIMES ONE, USING ENDOSCOPICALLY HARVESTED RIGHT GREATER SAPHENOUS VEIN;  Surgeon: [name], MD;  Location: [place];  Service: Open Heart Surgery;  Laterality: N/A;  . EYE SURGERY Bilateral    cataract removal  . LUMBAR LAMINECTOMY/ DECOMPRESSION WITH MET-RX N/A 03/16/2014   Procedure: LUMBAR FIVE-SACRAL ONE EXTRAFORAMINAL DISKECTOMY WITH METREX;  Surgeon: Kristeen Miss, MD;  Location: [place] [facility];  Service: Neurosurgery;  Laterality: N/A;  . LUMBAR LAMINECTOMY/DECOMPRESSION MICRODISCECTOMY Left 06/08/2014  Procedure: Left Lumbar Five-Sacral One Microdiskectomy;  Surgeon: Kristeen Miss, MD;  Location: [place] [facility];  Service: Neurosurgery;  Laterality: Left;  Left L5-S1 Microdiskectomy  . MITRAL VALVE REPLACEMENT N/A 04/23/2017   Procedure: MITRAL VALVE (MV) REPLACEMENT;  Surgeon: [name], MD;  Location: [place];  Service: Open Heart Surgery;  Laterality: N/A;  . PACEMAKER IMPLANT N/A 09/16/2017   Procedure: PACEMAKER IMPLANT;  Surgeon: Constance Haw, MD;  Location: [place] CV [facility];  Service: Cardiovascular;  Laterality: N/A;  . PELVIC FLOOR REPAIR    . POSTERIOR LUMBAR  FUSION 4 LEVEL N/A 08/08/2015   Procedure: T8-L1 posterior lateral fusion with decompression T12-L1;  Surgeon: Kristeen Miss, MD;  Location: [place] [facility];  Service: Neurosurgery;  Laterality: N/A;  T8-L1 posterior lateral fusion with decompression T12-L1  . REDUCTION MAMMAPLASTY Bilateral   . RIGHT/LEFT HEART CATH AND CORONARY ANGIOGRAPHY N/A 03/28/2017   Procedure: Right/Left Heart Cath and Coronary Angiography;  Surgeon: Nelva Bush, MD;  Location: [place] CV LAB;  Service: Cardiovascular;  Laterality: N/A;  . ROTATOR CUFF REPAIR Bilateral    11.12  . TEE WITHOUT CARDIOVERSION N/A 03/04/2017   Procedure: TRANSESOPHAGEAL ECHOCARDIOGRAM (TEE);  Surgeon: Fay Records, MD;  Location: [place];  Service: Cardiovascular;  Laterality: N/A;  . TEE WITHOUT CARDIOVERSION N/A 04/23/2017   Procedure: TRANSESOPHAGEAL ECHOCARDIOGRAM (TEE);  Surgeon: [name], MD;  Location: [place];  Service: Open Heart Surgery;  Laterality: N/A;  . TONSILLECTOMY AND ADENOIDECTOMY      reports that she has never smoked. She has never used smokeless tobacco. She reports that she does not drink alcohol or use drugs. family history includes Arthritis in her father and mother; Heart disease in her father; Heart failure in her father; Hyperlipidemia in her father and mother; Hypertension in her mother. Allergies  Allergen Reactions  . Cortisone Other (See Comments)    Insomnia, heart palpitations (po only)  . Statins Other (See Comments)    Myalgias   . Codeine Swelling    FACIAL SWELLING SEVERITY UNKNOWN  . Amiodarone Other (See Comments)    Tremors with 400 mg BID   . Lactose Intolerance (Gi) Nausea And Vomiting     Review of Systems  Constitutional: Negative for fatigue.  Eyes: Negative for visual disturbance.  Respiratory: Negative for cough, chest tightness, shortness of breath and wheezing.   Cardiovascular: Negative for chest pain, palpitations and leg swelling.  Endocrine: Negative for  polydipsia and polyuria.  Neurological: Negative for dizziness, seizures, syncope, weakness, light-headedness and headaches.       Objective:   Physical Exam  Constitutional: She appears well-developed and well-nourished.  Cardiovascular: Normal rate and regular rhythm.  Pulmonary/Chest: Effort normal and breath sounds normal. She has no rales.  Musculoskeletal:  Trace edema ankles and lower legs bilaterally       Assessment:     Type 2 diabetes. A1c 7.6%. Increased from 7.2% last visit    Plan:     -We discussed options. Patient is very reluctant to leave A1c where is though we explained that reasonable range for her age with multiple comorbidities might be 7-8%. She tried Januvia but did not have good success and also had cost issues. Cost will likely been issue with SGLT 2 class, GLP-1, or DPP 4 inhibitors. She is not a good candidate for SGLT 2 class because of history of frequent UTIs. -We also discussed potential role of low-dose long-acting insulin but she is not ready to start that at  this point -We discussed possible low-dose sulfonylurea but mentioned potential drawbacks of possible weight gain and also hypoglycemia. We elected to start low-dose Amaryl 2 mg once daily. Monitor blood sugars closely. Reassess in 3 months  Eulas Post MD [facility]

## 2018-04-22 ENCOUNTER — Ambulatory Visit: Payer: Medicare Other | Admitting: Family Medicine

## 2018-04-28 ENCOUNTER — Encounter: Payer: Medicare Other | Admitting: Thoracic Surgery (Cardiothoracic Vascular Surgery)

## 2018-05-12 ENCOUNTER — Other Ambulatory Visit: Payer: Self-pay | Admitting: Family Medicine

## 2018-05-19 ENCOUNTER — Other Ambulatory Visit: Payer: Self-pay

## 2018-05-19 ENCOUNTER — Ambulatory Visit: Payer: Medicare Other | Admitting: Thoracic Surgery (Cardiothoracic Vascular Surgery)

## 2018-05-19 ENCOUNTER — Encounter: Payer: Self-pay | Admitting: Thoracic Surgery (Cardiothoracic Vascular Surgery)

## 2018-05-19 VITALS — BP 122/60 | HR 60 | Resp 16 | Ht 66.0 in | Wt 140.0 lb

## 2018-05-19 DIAGNOSIS — Z953 Presence of xenogenic heart valve: Secondary | ICD-10-CM

## 2018-05-19 DIAGNOSIS — Z951 Presence of aortocoronary bypass graft: Secondary | ICD-10-CM

## 2018-05-19 NOTE — Progress Notes (Signed)
[address]       [place] [address]             [phone]     CARDIOTHORACIC SURGERY OFFICE NOTE  Referring Provider is Lori Records, MD PCP is Lori Post, MD   HPI:  Patient is an 84 year old female with history of mitral valve prolapse and mitral regurgitation, chronic diastolic congestive heart failure, coronary artery disease, hypertension, type 2 diabetes mellitus without complications, and scoliosis of the back with degenerative disc disease status Mccarthy lumbar fusion who returns to the office today for routine follow-up status Mccarthy mitral valve replacement using a bioprosthetic tissue valve, coronary artery bypass grafting 1, and clipping of the left atrial appendage on 04/23/2017.The patient's early postoperative recovery in the hospital was notable for the development of atrial fibrillation and acute on chronic diastolic congestive heart failure.  She initially did not tolerate atrial fibrillation very well but she eventually converted out of atrial fibrillation.  Initially she was notably bradycardic.  Permanent pacemaker placement was scheduled but the patient was found to be in sinus rhythm when she return for pacemaker implantation in early January.  She was last seen here in our office on September 23, 2017 at which time she was making slow but steady improvement.  Since then she has been followed carefully by Dr. Harrington Challenger and Dr. Haroldine Laws.  Repeat echocardiogram performed November 29, 2017 revealed improved left ventricular function with ejection fraction estimated 50 to 55%.  Her mitral valve prosthesis was functioning normally.  She continued to maintain sinus rhythm and Coumadin was discontinued.  She was last seen by Dr. Harrington Challenger on March 04, 2018.  No changes in her medications were recommended at that time.  She returns to our office today and reports that she is doing fairly well.  She has not had any significant symptoms of exertional shortness of breath  or chest discomfort.  She only gets short of breath with more strenuous exertion.  She ambulates using a cane because of some problems with balance.  She reports that she feels as though she is getting around reasonably well.  She has not had any palpitations or other symptoms to suggest recurrence of atrial fibrillation.  Current Outpatient Medications  Medication Sig Dispense Refill  . aspirin EC 81 MG tablet Take 1 tablet (81 mg total) by mouth daily. 90 tablet 3  . BIOTIN PO Take by mouth.    . bisacodyl (DULCOLAX) 5 MG EC tablet Take 10 mg by mouth daily as needed (for constipation.).    [name] conjugated estrogens (PREMARIN) vaginal cream Place 1 Applicatorful vaginally daily. 42.5 g 12  . esomeprazole (NEXIUM) 20 MG capsule Take 20 mg by mouth daily at 12 noon.    . famotidine (PEPCID) 20 MG tablet Take 20 mg by mouth daily.     . fexofenadine (ALLEGRA) 180 MG tablet Take 90 mg by mouth daily as needed for allergies or rhinitis (allergy headache).    . fluticasone (FLONASE) 50 MCG/ACT nasal spray SHAKE LIQUID AND USE 2 SPRAYS IN EACH NOSTRIL DAILY AS NEEDED FOR ALLERGIES 16 g 2  . glimepiride (AMARYL) 2 MG tablet Take 1 tablet (2 mg total) by mouth daily before breakfast. 30 tablet 3  . glucose blood (ONETOUCH VERIO) test strip TEST BLOOD SUGAR TWICE DAILY. DXE11.9 200 each 3  . metFORMIN (GLUCOPHAGE) 500 MG tablet Take 2 tablets (1,000 mg total) by mouth 2 (two) times daily with a meal.  360 tablet 3  . metoprolol succinate (TOPROL-XL) 25 MG 24 hr tablet TAKE 1 TABLET BY MOUTH  EVERY EVENING. TAKE WITH OR IMMEDIATELY FOLLOWING A  MEAL. 90 tablet 3  . Multiple Vitamins-Minerals (ICAPS AREDS 2) CAPS Take 1 capsule by mouth 2 (two) times daily.    Glory Rosebush DELICA LANCETS 57D MISC USE TO TEST TWICE DAILY. DX E11.9 100 each 0  . PREMARIN vaginal cream INSERT 1 APPLICATORFUL VAGINALLY DAILY 30 g 0  . sacubitril-valsartan (ENTRESTO) 24-26 MG Take 1 tablet by mouth 2 (two) times daily. 180 tablet 3    . zolpidem (AMBIEN) 5 MG tablet Take 1 tablet (5 mg total) by mouth at bedtime as needed for sleep. 30 tablet 1   No current facility-administered medications for this visit.       Physical Exam:   Ht 5\' 6"  (1.676 m)   BMI 22.74 kg/m   General:  Elderly but well-appearing  Chest:   Clear to auscultation  CV:   Regular rate and rhythm without murmur  Incisions:  Completely healed  Abdomen:  Soft nontender  Extremities:  Warm and well-perfused  Diagnostic Tests:  Transthoracic Echocardiography  Patient:    Tinea, Nobile MR #:       [id number] Study Date: 11/29/2017 Gender:     F Age:        14 Height:     167.6 cm Weight:     58.7 kg BSA:        1.65 m^2 Pt. Status: Room:   SONOGRAPHER  Diamond Nickel  ATTENDING    Lori Klein, MD  ORDERING     Mccarthy, [name]    Mccarthy, Lori  PERFORMING   [facility], Outpatient  cc:  ------------------------------------------------------------------- LV EF: 50% -   55%  ------------------------------------------------------------------- Indications:      Z95.3 Mitral valve replacement with bioprosthetic valve. I48.0 Atrial fibrillation. I05.9 Mitral valve disorder.  ------------------------------------------------------------------- History:   PMH:  Scoliosis.  Risk factors:  Hypertension. Diabetes mellitus. Dyslipidemia.  ------------------------------------------------------------------- Study Conclusions  - Left ventricle: The cavity size was normal. Systolic function was   at the lower limits of normal. The estimated ejection fraction   was in the range of 50% to 55%. Wall motion was normal; there   were no regional wall motion abnormalities. The study is not   technically sufficient to allow evaluation of LV diastolic   function. - Ventricular septum: Septal motion showed paradox. 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 - Mitral valve: A bioprosthesis was present and  functioning   normally. - Left atrium: The atrium was mildly dilated.  ------------------------------------------------------------------- Study data:  Comparison was made to the study of 05/21/2017.  Study status:  Routine.  Procedure:  The patient reported no pain pre or Mccarthy test. Transthoracic echocardiography. Image quality was adequate.  Study completion:  There were no complications. Transthoracic echocardiography.  M-mode, complete 2D, spectral Doppler, and color Doppler.  Birthdate:  Patient birthdate: 1934-03-11.  Age:  Patient is 84 yr old.  Sex:  Gender: female. BMI: 20.9 kg/m^2.  Blood pressure:     129/69  Patient status: Outpatient.  Study date:  Study date: 11/29/2017. Study time: 11:33 AM.  Location:  [facility]  -------------------------------------------------------------------  ------------------------------------------------------------------- Left ventricle:  The cavity size was normal. Systolic function was at the lower limits of normal. The estimated ejection fraction was in the range of 50% to 55%. Wall motion was normal; there were no regional  wall motion abnormalities. The study is not technically sufficient to allow evaluation of LV diastolic function.  ------------------------------------------------------------------- Aortic valve:   Trileaflet; normal thickness leaflets. Mobility was not restricted.  Doppler:  Transvalvular velocity was within the normal range. There was no stenosis. There was no regurgitation.   ------------------------------------------------------------------- Aorta:  Aortic root: The aortic root was normal in size.  ------------------------------------------------------------------- Mitral valve:  A bioprosthesis was present and functioning normally. Mobility was not restricted.  Doppler:  Transvalvular velocity was within the normal range. There was no evidence for stenosis. There was no  regurgitation.  ------------------------------------------------------------------- Left atrium:  The atrium was mildly dilated.  ------------------------------------------------------------------- Right ventricle:  The cavity size was normal. Wall thickness was normal. Systolic function was normal.  ------------------------------------------------------------------- Ventricular septum:   Septal motion showed paradox. 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  ------------------------------------------------------------------- Pulmonic valve:   Poorly visualized.  The valve appears to be grossly normal.    Doppler:  Transvalvular velocity was within the normal range. There was no evidence for stenosis.  ------------------------------------------------------------------- Tricuspid valve:   Structurally normal valve.    Doppler: Transvalvular velocity was within the normal range. There was trivial regurgitation.  ------------------------------------------------------------------- Pulmonary artery:   The main pulmonary artery was normal-sized. Systolic pressure was within the normal range.  ------------------------------------------------------------------- Right atrium:  The atrium was normal in size.  ------------------------------------------------------------------- Pericardium:  There was no pericardial effusion.  ------------------------------------------------------------------- Systemic veins: Inferior vena cava: The vessel was normal in size.  ------------------------------------------------------------------- Measurements   Left ventricle                         Value        Reference  LV ID, ED, PLAX chordal        (L)     39.6  mm     43 - 52  LV ID, ES, PLAX chordal                24.4  mm     23 - 38  LV fx shortening, PLAX chordal         38    %      >=29  LV PW thickness, ED                    12.5  mm     ---------  IVS/LV PW  ratio, ED                    0.9          <=1.3    Ventricular septum                     Value        Reference  IVS thickness, ED                      11.2  mm     ---------    LVOT                                   Value        Reference  LVOT ID, S                             19    mm     ---------  LVOT area                              2.84  cm^2   ---------    Aorta                                  Value        Reference  Aortic root ID, ED                     28    mm     ---------    Left atrium                            Value        Reference  LA ID, A-P, ES                         44    mm     ---------  LA ID/bsa, A-P                 (H)     2.66  cm/m^2 <=2.2  LA volume, S                           58    ml     ---------  LA volume/bsa, S                       35.1  ml/m^2 ---------  LA volume, ES, 1-p A4C                 79    ml     ---------  LA volume/bsa, ES, 1-p A4C             47.8  ml/m^2 ---------  LA volume, ES, 1-p A2C                 34    ml     ---------  LA volume/bsa, ES, 1-p A2C             20.6  ml/m^2 ---------    Mitral valve                           Value        Reference  Mitral E-wave peak velocity            64.7  cm/s   ---------  Mitral A-wave peak velocity            62.7  cm/s   ---------  Mitral deceleration time       (H)     278   ms     150 - 230  Mitral E/A ratio, peak                 1            ---------    Pulmonary arteries                     Value        Reference  PA pressure, S, DP  30    mm Hg  <=30    Tricuspid valve                        Value        Reference  Tricuspid regurg peak velocity         258   cm/s   ---------  Tricuspid peak RV-RA gradient          27    mm Hg  ---------    Systemic veins                         Value        Reference  Estimated CVP                          3     mm Hg  ---------    Right ventricle                        Value        Reference  RV pressure, S, DP                      30    mm Hg  <=30  RV s&', lateral, S                      7.46  cm/s   ---------  Legend: (L)  and  (H)  mark values outside specified reference range.  ------------------------------------------------------------------- Prepared and Electronically Authenticated by  Lori Klein, MD 2019-03-22T17:03:58   Impression:  Patient is doing well approximately 1 year status Mccarthy mitral valve replacement using a bioprosthetic tissue valve and single-vessel coronary artery bypass grafting.  Most recent follow-up echocardiogram demonstrates normal functioning bioprosthetic tissue valve in the mitral position with normal left ventricular systolic function.  Plan:  We have not recommended any change the patient's current medications.  She will continue to follow-up regularly with Dr. Harrington Challenger and return to our office in the future only should specific problems or questions arise.  The patient has been reminded regarding the importance of dental hygiene and the lifelong need for antibiotic prophylaxis for all dental cleanings and other related invasive procedures.   I spent in excess of 15 minutes during the conduct of this office consultation and >50% of this time involved direct face-to-face encounter with the patient for counseling and/or coordination of their care.    Valentina Gu. Roxy Manns, MD 05/19/2018 12:13 PM

## 2018-05-19 NOTE — Patient Instructions (Signed)

## 2018-05-29 ENCOUNTER — Other Ambulatory Visit: Payer: Self-pay | Admitting: Family Medicine

## 2018-05-29 NOTE — Telephone Encounter (Signed)
i'm not sure where this request is coming from.  Offer follow up to discuss/evaluate if having sleep difficulty.

## 2018-05-29 NOTE — Telephone Encounter (Signed)
Rx not on the pts current med list?

## 2018-05-30 NOTE — Telephone Encounter (Signed)
I called the pt and informed her of the message below.  She stated she is having difficulty sleeping and an appt was scheduled for 9/23.

## 2018-06-02 ENCOUNTER — Ambulatory Visit (INDEPENDENT_AMBULATORY_CARE_PROVIDER_SITE_OTHER): Payer: Medicare Other | Admitting: Family Medicine

## 2018-06-02 ENCOUNTER — Other Ambulatory Visit: Payer: Self-pay

## 2018-06-02 ENCOUNTER — Encounter: Payer: Self-pay | Admitting: Family Medicine

## 2018-06-02 VITALS — BP 116/62 | HR 59 | Temp 97.6°F | Ht 66.0 in | Wt 138.6 lb

## 2018-06-02 DIAGNOSIS — E1122 Type 2 diabetes mellitus with diabetic chronic kidney disease: Secondary | ICD-10-CM | POA: Diagnosis not present

## 2018-06-02 DIAGNOSIS — Z23 Encounter for immunization: Secondary | ICD-10-CM

## 2018-06-02 DIAGNOSIS — F5104 Psychophysiologic insomnia: Secondary | ICD-10-CM

## 2018-06-02 MED ORDER — ZOLPIDEM TARTRATE 5 MG PO TABS: 5.0000 mg | ORAL_TABLET | Freq: Every evening | ORAL | 1 refills | Status: DC | PRN

## 2018-06-02 MED ORDER — GLIMEPIRIDE 4 MG PO TABS: 4.0000 mg | ORAL_TABLET | Freq: Every day | ORAL | 3 refills | Status: AC

## 2018-06-02 NOTE — Patient Instructions (Signed)

## 2018-06-02 NOTE — Progress Notes (Signed)
Subjective:     Patient ID: [name], female   DOB: Aug 26, 1934, 84 y.o.   MRN: [id number]  HPI Patient seen for the following issues  Chronic insomnia. She has tried multiple things in the past including melatonin up to 5 mg without much improvement. We tried trazodone but she did not see improvement with that either. She drinks no caffeine in the afternoon and usually 1 cup of coffee in the morning. No alcohol use. She has difficulty getting asleep and staying asleep. She states she has a difficult time "turning her mind off ". She's battled this to some extent for years Has had depression past but denies any depression symptoms currently  Type 2 diabetes. Last A1c and inJuly 7.6%. Recent fasting blood sugars up sometimes as high as 180. Currently on metformin and also takes Amaryl 2 mg daily. She had cost issues with Januvia. She states she is currently in the "doughnut hole ".  Past Medical History:  Diagnosis Date  . Allergy   . Anxiety   . Arthritis   . Atrial enlargement, left   . CHF (congestive heart failure) ([facility] [facility])   . Coronary artery disease   . Depression   . Diabetes mellitus without complication (HCC)    fasting cbg 110s  . Dyspnea   . GERD (gastroesophageal reflux disease)   . H/O hiatal hernia   . Headache(784.0)   . Heart murmur    06/10/2014 seeing new cardiologist  . Heart murmur   . HOH (hard of hearing)    HOH in left ear; needs to speak to patient in right ear  . Hyperlipidemia   . Hypertension   . Mitral regurgitation   . Mitral valve prolapse   . Palpitations    afib on 03/04/17 EKG  . PONV (postoperative nausea and vomiting)    Patient stated "I like the patch behind my ear"  . 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 S/P mitral valve replacement with bioprosthetic valve 04/23/2017   [facility] Mitral bovine pericardial tissue valve: Model 7300TFX, Serial [id number], size 31  . UTI (lower urinary tract infection)     Past Surgical History:  Procedure Laterality Date  . ABDOMINAL HYSTERECTOMY    . Anterior posterior and enterocele repairs  09/21/2004   With uterosacral cardinal colposuspension, partial colpocleisis; Selinda Orion, MD  . BACK SURGERY    . BREAST ENHANCEMENT SURGERY  02/25/2002   Bilateral reduction and excision of accessory breast tissue underneath the left breast; Aretha Parrot., MD  . BREAST SURGERY  11   reduction  . CARDIAC CATHETERIZATION     no PCI  . CARDIAC CATHETERIZATION  05/02/2017  . CATARACT EXTRACTION Bilateral   . CLIPPING OF ATRIAL APPENDAGE  04/23/2017   Procedure: CLIPPING OF LEFT ATRIAL APPENDAGE;  Surgeon: [name], MD;  Location: [facility];  Service: Open Heart Surgery;;  . COLONOSCOPY    . CORONARY ARTERY BYPASS GRAFT N/A 04/23/2017   Procedure: CORONARY ARTERY BYPASS GRAFTING (CABG), ON PUMP, TIMES ONE, USING ENDOSCOPICALLY HARVESTED RIGHT GREATER SAPHENOUS VEIN;  Surgeon: [name], MD;  Location: [facility];  Service: Open Heart Surgery;  Laterality: N/A;  . EYE SURGERY Bilateral    cataract removal  . LUMBAR LAMINECTOMY/ DECOMPRESSION WITH MET-RX N/A 03/16/2014   Procedure: LUMBAR FIVE-SACRAL ONE EXTRAFORAMINAL DISKECTOMY WITH METREX;  Surgeon: Kristeen Miss, MD;  Location: [facility];  Service: Neurosurgery;  Laterality: N/A;  . LUMBAR LAMINECTOMY/DECOMPRESSION MICRODISCECTOMY Left 06/08/2014   Procedure: Left Lumbar Five-Sacral One Microdiskectomy;  Surgeon: Kristeen Miss, MD;  Location: [facility];  Service: Neurosurgery;  Laterality: Left;  Left L5-S1 Microdiskectomy  . MITRAL VALVE REPLACEMENT N/A 04/23/2017   Procedure: MITRAL VALVE (MV) REPLACEMENT;  Surgeon: [name], MD;  Location: [facility];  Service: Open Heart Surgery;  Laterality: N/A;  . PACEMAKER IMPLANT N/A 09/16/2017   Procedure: PACEMAKER IMPLANT;  Surgeon: Constance Haw, MD;  Location: [facility];  Service: Cardiovascular;  Laterality: N/A;  . PELVIC FLOOR  REPAIR    . POSTERIOR LUMBAR FUSION 4 LEVEL N/A 08/08/2015   Procedure: T8-L1 posterior lateral fusion with decompression T12-L1;  Surgeon: Kristeen Miss, MD;  Location: [facility];  Service: Neurosurgery;  Laterality: N/A;  T8-L1 posterior lateral fusion with decompression T12-L1  . REDUCTION MAMMAPLASTY Bilateral   . RIGHT/LEFT HEART CATH AND CORONARY ANGIOGRAPHY N/A 03/28/2017   Procedure: Right/Left Heart Cath and Coronary Angiography;  Surgeon: Nelva Bush, MD;  Location: [facility];  Service: Cardiovascular;  Laterality: N/A;  . ROTATOR CUFF REPAIR Bilateral    11.12  . TEE WITHOUT CARDIOVERSION N/A 03/04/2017   Procedure: TRANSESOPHAGEAL ECHOCARDIOGRAM (TEE);  Surgeon: Fay Records, MD;  Location: [facility];  Service: Cardiovascular;  Laterality: N/A;  . TEE WITHOUT CARDIOVERSION N/A 04/23/2017   Procedure: TRANSESOPHAGEAL ECHOCARDIOGRAM (TEE);  Surgeon: [name], MD;  Location: [facility];  Service: Open Heart Surgery;  Laterality: N/A;  . TONSILLECTOMY AND ADENOIDECTOMY      reports that she has never smoked. She has never used smokeless tobacco. She reports that she does not drink alcohol or use drugs. family history includes Arthritis in her father and mother; Heart disease in her father; Heart failure in her father; Hyperlipidemia in her father and mother; Hypertension in her mother. Allergies  Allergen Reactions  . Cortisone Other (See Comments)    Insomnia, heart palpitations (po only)  . Statins Other (See Comments)    Myalgias   . Sulfamethoxazole Other (See Comments)    Unsure of reaction  . Codeine Swelling    FACIAL SWELLING SEVERITY UNKNOWN  . Amiodarone Other (See Comments)    Tremors with 400 mg BID   . Lactose Intolerance (Gi) Nausea And Vomiting     Review of Systems  Constitutional: Negative for appetite change, chills, fever and unexpected weight change.  Respiratory: Negative for shortness of breath.   Cardiovascular: Negative for  chest pain.  Gastrointestinal: Negative for abdominal pain.  Endocrine: Negative for polydipsia and polyuria.  Psychiatric/Behavioral: Positive for sleep disturbance. Negative for dysphoric mood.       Objective:   Physical Exam  Constitutional: She is oriented to person, place, and time. She appears well-developed and well-nourished.  Cardiovascular: Normal rate and regular rhythm.  Pulmonary/Chest: Effort normal and breath sounds normal.  Musculoskeletal:  no pitting edema  Neurological: She is alert and oriented to person, place, and time.  Psychiatric: She has a normal mood and affect. Her behavior is normal.       Assessment:     #1 type 2 diabetes. Recent A1c 7.6%. Patient is concerned because some recent elevated fasting blood sugars.  #2 chronic insomnia. Not relieved with previous things including melatonin or trazodone. She has done well with low-dose Ambien in the past    Plan:     -Long discussion regarding insomnia and nonpharmacologic ways of trying to address this. We discussed  risk of sedative medications such as Ambien. Avoid benzodiazepines if possible.We did agree to refill Ambien 5 mg after long discussion of potential risk. She is encouraged to take this only infrequently as needed -We had a long discussion regarding diabetes management options. She is concerned because of cost issues and is not interested in looking at long-acting insulin this point. Would not consider S GLT 2 medication because of her prior history of frequent UTIs. She did not have much response with Januvia. We agreed to cautious increase Amaryl to 4 mg but watch for hypoglycemia and heat regularly. She has follow-up in October and repeat A1c then.  Eulas Post MD  Primary Care at [facility]

## 2018-06-03 DIAGNOSIS — H5202 Hypermetropia, left eye: Secondary | ICD-10-CM | POA: Diagnosis not present

## 2018-06-03 DIAGNOSIS — E119 Type 2 diabetes mellitus without complications: Secondary | ICD-10-CM | POA: Diagnosis not present

## 2018-06-03 DIAGNOSIS — H5211 Myopia, right eye: Secondary | ICD-10-CM | POA: Diagnosis not present

## 2018-06-03 DIAGNOSIS — Z7984 Long term (current) use of oral hypoglycemic drugs: Secondary | ICD-10-CM | POA: Diagnosis not present

## 2018-06-03 DIAGNOSIS — H524 Presbyopia: Secondary | ICD-10-CM | POA: Diagnosis not present

## 2018-06-04 ENCOUNTER — Telehealth: Payer: Self-pay | Admitting: Internal Medicine

## 2018-06-04 NOTE — Telephone Encounter (Signed)
Walk in pt Form-Sealed Envelope dropped off. Placed in [place] doc box.

## 2018-06-05 NOTE — Telephone Encounter (Signed)
Entresto patient support enrollment form received. [name] was initially prescribed by Dr. Haroldine Laws.  According to chart will continue to follow with Dr.Ross.  Will place in P. Via, LPN's box for review.

## 2018-06-06 NOTE — Telephone Encounter (Signed)
I have completed the Pt asst application and faxed it to [facility].

## 2018-06-10 ENCOUNTER — Ambulatory Visit: Payer: Self-pay | Admitting: Family Medicine

## 2018-06-10 NOTE — Telephone Encounter (Signed)
  Incoming call from patient with complains of increasing blood sugars levels.  Patient states her latest blood sugar was 253, after eating an apple about 2 hours earlier, along with toast.  Patient states she is on metformin 500 mg and glimepiride 4mg . Patient states that blood sugars appear to be rising instead of decreasing.  Glimepride was the latest med added.  Patient states her meter is 77 to 82 years old.  Not sure if that is the reason. Patient states she doubled up on her medications and it still didn't decrease values.Recommende that patient did not alter the doses. Scheduled an appointment for 06/11/18@5pmwith  Dr. Elease Hashimoto.  Patient voiced understanding.  Provided care advice also voiced understanding.     Reason for Disposition . [1] Caller has NON-URGENT medication or insulin pump question AND [2] triager unable to answer question  Answer Assessment - Initial Assessment Questions 1. BLOOD GLUCOSE: "What is your blood glucose level?"     253 three hours after eating apple 2. ONSET: "When did you check the blood glucose?"      3. USUAL RANGE: "What is your glucose level usually?" (e.g., usual fasting morning value, usual evening value)     Been  Running high 150 4. KETONES: "Do you check for ketones (urine or blood test strips)?" If yes, ask: "What does the test show now?"      *No Answer* 5. TYPE 1 or 2:  "Do you know what type of diabetes you have?"  (e.g., Type 1, Type 2, Gestational; doesn't know)      Type 2 6. INSULIN: "Do you take insulin?" "What type of insulin(s) do you use? What is the mode of delivery? (syringe, pen (e.g., injection or  pump)?"      No just pills 7. DIABETES PILLS: "Do you take any pills for your diabetes?" If yes, ask: "Have you missed taking any pills recently?"     Metformin and gliper 8. OTHER SYMPTOMS: "Do you have any symptoms?" (e.g., fever, frequent urination, difficulty breathing, dizziness, weakness, vomiting)    Lightheadedness bad sinus  allergies 9. PREGNANCY: "Is there any chance you are pregnant?" "When was your last menstrual period?"     *No Answer*  Protocols used: DIABETES - HIGH BLOOD SUGAR-A-AH

## 2018-06-11 ENCOUNTER — Other Ambulatory Visit: Payer: Self-pay | Admitting: *Deleted

## 2018-06-11 ENCOUNTER — Other Ambulatory Visit: Payer: Self-pay

## 2018-06-11 ENCOUNTER — Ambulatory Visit (INDEPENDENT_AMBULATORY_CARE_PROVIDER_SITE_OTHER): Payer: Medicare Other | Admitting: Family Medicine

## 2018-06-11 ENCOUNTER — Encounter: Payer: Self-pay | Admitting: Family Medicine

## 2018-06-11 VITALS — BP 120/72 | HR 62 | Temp 97.8°F | Ht 66.0 in | Wt 137.2 lb

## 2018-06-11 DIAGNOSIS — E1122 Type 2 diabetes mellitus with diabetic chronic kidney disease: Secondary | ICD-10-CM

## 2018-06-11 LAB — POCT GLUCOSE (DEVICE FOR HOME USE): POC GLUCOSE: 344 mg/dL — AB (ref 70–99)

## 2018-06-11 MED ORDER — METOPROLOL SUCCINATE ER 25 MG PO TB24: ORAL_TABLET | ORAL | 0 refills | Status: DC

## 2018-06-11 NOTE — Progress Notes (Signed)
Subjective:     Patient ID: [name], female   DOB: Jul 30, 1934, 84 y.o.   MRN: [id number]  HPI Patient seen for follow-up hyperglycemia.  She brings in log of readings.  Some of these are fasting and some later in the day but they have been consistently over 200 and some readings over 300.  She is on metformin and also takes glimepiride which we recently increased to 4 mg daily.  She has not seen any improvement with that.  She previously took Januvia but did not see much improvement.  She has had frequent UTIs and is not a good candidate for SGLT-2 medication.  She has had some recent mild weight loss.  Recent A1c 7.6%.  Denies any fever or chills.  She has been trying to follow low CHO diet.  Other chronic problems include:  A fib, hypertension, CAD, systolic heart failure, familial tremor,macular degeneration  Past Medical History:  Diagnosis Date  . Allergy   . Anxiety   . Arthritis   . Atrial enlargement, left   . CHF (congestive heart failure) ([place])   . Coronary artery disease   . Depression   . Diabetes mellitus without complication (HCC)    fasting cbg 110s  . Dyspnea   . GERD (gastroesophageal reflux disease)   . H/O hiatal hernia   . Headache(784.0)   . Heart murmur    06/10/2014 seeing new cardiologist  . Heart murmur   . HOH (hard of hearing)    HOH in left ear; needs to speak to patient in right ear  . Hyperlipidemia   . Hypertension   . Mitral regurgitation   . Mitral valve prolapse   . Palpitations    afib on 03/04/17 EKG  . PONV (postoperative nausea and vomiting)    Patient stated "I like the patch behind my ear"  . 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 S/P mitral valve replacement with bioprosthetic valve 04/23/2017   [facility] Mitral bovine pericardial tissue valve: Model 7300TFX, Serial [id number], size 31  . UTI (lower urinary tract infection)    Past Surgical History:  Procedure Laterality Date  . ABDOMINAL  HYSTERECTOMY    . Anterior posterior and enterocele repairs  09/21/2004   With uterosacral cardinal colposuspension, partial colpocleisis; Selinda Orion, MD  . BACK SURGERY    . BREAST ENHANCEMENT SURGERY  02/25/2002   Bilateral reduction and excision of accessory breast tissue underneath the left breast; Aretha Parrot., MD  . BREAST SURGERY  11   reduction  . CARDIAC CATHETERIZATION     no PCI  . CARDIAC CATHETERIZATION  05/02/2017  . CATARACT EXTRACTION Bilateral   . CLIPPING OF ATRIAL APPENDAGE  04/23/2017   Procedure: CLIPPING OF LEFT ATRIAL APPENDAGE;  Surgeon: [name], MD;  Location: [place];  Service: Open Heart Surgery;;  . COLONOSCOPY    . CORONARY ARTERY BYPASS GRAFT N/A 04/23/2017   Procedure: CORONARY ARTERY BYPASS GRAFTING (CABG), ON PUMP, TIMES ONE, USING ENDOSCOPICALLY HARVESTED RIGHT GREATER SAPHENOUS VEIN;  Surgeon: [name], MD;  Location: [place];  Service: Open Heart Surgery;  Laterality: N/A;  . EYE SURGERY Bilateral    cataract removal  . LUMBAR LAMINECTOMY/ DECOMPRESSION WITH MET-RX N/A 03/16/2014   Procedure: LUMBAR FIVE-SACRAL ONE EXTRAFORAMINAL DISKECTOMY WITH METREX;  Surgeon: Kristeen Miss, MD;  Location: [place] NEUR[facility];  Service: Neurosurgery;  Laterality: N/A;  . LUMBAR LAMINECTOMY/DECOMPRESSION MICRODISCECTOMY  Left 06/08/2014   Procedure: Left Lumbar Five-Sacral One Microdiskectomy;  Surgeon: Kristeen Miss, MD;  Location: [place] NEURO ORS;  Service: Neurosurgery;  Laterality: Left;  Left L5-S1 Microdiskectomy  . MITRAL VALVE REPLACEMENT N/A 04/23/2017   Procedure: MITRAL VALVE (MV) REPLACEMENT;  Surgeon: [name], MD;  Location: [place];  Service: Open Heart Surgery;  Laterality: N/A;  . PACEMAKER IMPLANT N/A 09/16/2017   Procedure: PACEMAKER IMPLANT;  Surgeon: Constance Haw, MD;  Location: [place] CV LAB;  Service: Cardiovascular;  Laterality: N/A;  . PELVIC FLOOR REPAIR    . POSTERIOR LUMBAR FUSION 4 LEVEL N/A 08/08/2015    Procedure: T8-L1 posterior lateral fusion with decompression T12-L1;  Surgeon: Kristeen Miss, MD;  Location: [place] NEUR[facility] ORS;  Service: Neurosurgery;  Laterality: N/A;  T8-L1 posterior lateral fusion with decompression T12-L1  . REDUCTION MAMMAPLASTY Bilateral   . RIGHT/LEFT HEART CATH AND CORONARY ANGIOGRAPHY N/A 03/28/2017   Procedure: Right/Left Heart Cath and Coronary Angiography;  Surgeon: Nelva Bush, MD;  Location: [place] CV LAB;  Service: Cardiovascular;  Laterality: N/A;  . ROTATOR CUFF REPAIR Bilateral    11.12  . TEE WITHOUT CARDIOVERSION N/A 03/04/2017   Procedure: TRANSESOPHAGEAL ECHOCARDIOGRAM (TEE);  Surgeon: Fay Records, MD;  Location: [place];  Service: Cardiovascular;  Laterality: N/A;  . TEE WITHOUT CARDIOVERSION N/A 04/23/2017   Procedure: TRANSESOPHAGEAL ECHOCARDIOGRAM (TEE);  Surgeon: [name], MD;  Location: [place];  Service: Open Heart Surgery;  Laterality: N/A;  . TONSILLECTOMY AND ADENOIDECTOMY      reports that she has never smoked. She has never used smokeless tobacco. She reports that she does not drink alcohol or use drugs. family history includes Arthritis in her father and mother; Heart disease in her father; Heart failure in her father; Hyperlipidemia in her father and mother; Hypertension in her mother. Allergies  Allergen Reactions  . Cortisone Other (See Comments)    Insomnia, heart palpitations (po only)  . Statins Other (See Comments)    Myalgias   . Sulfamethoxazole Other (See Comments)    Unsure of reaction  . Codeine Swelling    FACIAL SWELLING SEVERITY UNKNOWN  . Amiodarone Other (See Comments)    Tremors with 400 mg BID   . Lactose Intolerance (Gi) Nausea And Vomiting     Review of Systems  Constitutional: Positive for fatigue. Negative for appetite change, chills and fever.  Respiratory: Negative for shortness of breath.   Cardiovascular: Negative for chest pain.  Gastrointestinal: Negative for abdominal pain.   Endocrine: Negative for polydipsia and polyuria.  Genitourinary: Negative for dysuria.       Objective:   Physical Exam  Constitutional: She appears well-developed and well-nourished.  HENT:  Mouth/Throat: Oropharynx is clear and moist.  Cardiovascular: Normal rate and regular rhythm.  Pulmonary/Chest: Effort normal and breath sounds normal.  Neurological: She is alert.       Assessment:     History of type 2 diabetes very poorly controlled.  Recent A1c 7.6%.  She has 4-hour postprandial blood sugar today 344 with consistent readings over 200 in spite of recent increase in Amaryl dosage.    Plan:     -We recommend she start long-acting insulin with Basaglar 10 units once daily and titrate up 2 units every 3 days for consistent fasting blood sugars over 130.  She was given new home glucose monitor.  She was also given sample of Basaglar to start. -Stressed importance of good hydration.  She is already following fairly low  carbohydrate diet. -Bring back to reassess in 3 weeks.  Will reassess A1c then -Viewed signs and symptoms of hypoglycemia -pt was instructed in proper use/self administration of insulin.  Eulas Post MD [facility]

## 2018-06-11 NOTE — Patient Instructions (Signed)
Start the insulin at 10 units once daily  Monitor fasting glucose every morning.  If fasting glucose > 130 for 3 days consecutive go up 2 units every 3 days.  Continue oral diabetic medications

## 2018-06-11 NOTE — Progress Notes (Signed)
Medication Samples have been provided to the patient.  Drug name: Basaglar       Strength: 100 units/ml      Qty: 1 pen LOT: [id number]  Exp.Date: 01/24/19  Dosing instructions: 10 units qd  The patient has been instructed regarding the correct time, dose, and frequency of taking this medication, including desired effects and most common side effects.   Westley Hummer 5:58 PM 06/11/2018

## 2018-06-12 ENCOUNTER — Telehealth: Payer: Self-pay | Admitting: *Deleted

## 2018-06-12 NOTE — Telephone Encounter (Signed)
Received a fax from [facility] that pt has requested financial assistance with [employer]. They have referred her to [employer] Patient Assistance.

## 2018-06-18 NOTE — Telephone Encounter (Signed)
Letter received via fax from [facility] stating that the pt may be eligible for assistance with obtaining medication from an alternate funding source.  Medicare LIS-[id number] [employer]-[phone]  I called the pt and she states that she received the same letter. I encouraged her to call above mentioned alternate funding sources and offered her the phone numbers to contact both. She declined as she states that she has her letter from [employer] and will get the phone numbers to call from it.

## 2018-07-09 ENCOUNTER — Other Ambulatory Visit: Payer: Self-pay

## 2018-07-09 ENCOUNTER — Ambulatory Visit (INDEPENDENT_AMBULATORY_CARE_PROVIDER_SITE_OTHER): Payer: Medicare Other | Admitting: Family Medicine

## 2018-07-09 ENCOUNTER — Encounter: Payer: Self-pay | Admitting: Family Medicine

## 2018-07-09 VITALS — BP 120/78 | Temp 97.7°F | Ht 66.0 in | Wt 139.9 lb

## 2018-07-09 DIAGNOSIS — Z794 Long term (current) use of insulin: Secondary | ICD-10-CM | POA: Diagnosis not present

## 2018-07-09 DIAGNOSIS — E1122 Type 2 diabetes mellitus with diabetic chronic kidney disease: Secondary | ICD-10-CM

## 2018-07-09 LAB — POCT GLYCOSYLATED HEMOGLOBIN (HGB A1C): HEMOGLOBIN A1C: 8.9 % — AB (ref 4.0–5.6)

## 2018-07-09 MED ORDER — INSULIN PEN NEEDLE 31G X 5 MM MISC: 3 refills | Status: AC

## 2018-07-09 MED ORDER — BASAGLAR KWIKPEN 100 UNIT/ML ~~LOC~~ SOPN: PEN_INJECTOR | SUBCUTANEOUS | 3 refills | Status: DC

## 2018-07-09 NOTE — Progress Notes (Signed)
Subjective:     Patient ID: [name], female   DOB: 1933/10/09, 84 y.o.   MRN: [id number]  HPI Here for follow-up regarding poorly controlled type 2 diabetes.  She had been on a regimen of metformin and Amaryl.  She came in with several blood sugars over 250.  She felt poorly.  We added long-acting insulin with Basaglar 10 units once daily.  She has had dramatic improvement in insulins with fasting blood sugars as low as 65 and most of these have been low 100s or under 100.  She feels better overall.  Has had almost 3 pounds weight gain.  No polyuria or polydipsia.  Past Medical History:  Diagnosis Date  . Allergy   . Anxiety   . Arthritis   . Atrial enlargement, left   . CHF (congestive heart failure) ([place])   . Coronary artery disease   . Depression   . Diabetes mellitus without complication (HCC)    fasting cbg 110s  . Dyspnea   . GERD (gastroesophageal reflux disease)   . H/O hiatal hernia   . Headache(784.0)   . Heart murmur    06/10/2014 seeing new cardiologist  . Heart murmur   . HOH (hard of hearing)    HOH in left ear; needs to speak to patient in right ear  . Hyperlipidemia   . Hypertension   . Mitral regurgitation   . Mitral valve prolapse   . Palpitations    afib on 03/04/17 EKG  . PONV (postoperative nausea and vomiting)    Patient stated "I like the patch behind my ear"  . 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 S/P mitral valve replacement with bioprosthetic valve 04/23/2017   [facility] Mitral bovine pericardial tissue valve: Model 7300TFX, Serial [id number], size 31  . UTI (lower urinary tract infection)    Past Surgical History:  Procedure Laterality Date  . ABDOMINAL HYSTERECTOMY    . Anterior posterior and enterocele repairs  09/21/2004   With uterosacral cardinal colposuspension, partial colpocleisis; Selinda Orion, MD  . BACK SURGERY    . BREAST ENHANCEMENT SURGERY  02/25/2002   Bilateral reduction and excision of  accessory breast tissue underneath the left breast; Aretha Parrot., MD  . BREAST SURGERY  11   reduction  . CARDIAC CATHETERIZATION     no PCI  . CARDIAC CATHETERIZATION  05/02/2017  . CATARACT EXTRACTION Bilateral   . CLIPPING OF ATRIAL APPENDAGE  04/23/2017   Procedure: CLIPPING OF LEFT ATRIAL APPENDAGE;  Surgeon: [name], MD;  Location: [place];  Service: Open Heart Surgery;;  . COLONOSCOPY    . CORONARY ARTERY BYPASS GRAFT N/A 04/23/2017   Procedure: CORONARY ARTERY BYPASS GRAFTING (CABG), ON PUMP, TIMES ONE, USING ENDOSCOPICALLY HARVESTED RIGHT GREATER SAPHENOUS VEIN;  Surgeon: [name], MD;  Location: [place];  Service: Open Heart Surgery;  Laterality: N/A;  . EYE SURGERY Bilateral    cataract removal  . LUMBAR LAMINECTOMY/ DECOMPRESSION WITH MET-RX N/A 03/16/2014   Procedure: LUMBAR FIVE-SACRAL ONE EXTRAFORAMINAL DISKECTOMY WITH METREX;  Surgeon: Kristeen Miss, MD;  Location: [place] NEURO ORS;  Service: Neurosurgery;  Laterality: N/A;  . LUMBAR LAMINECTOMY/DECOMPRESSION MICRODISCECTOMY Left 06/08/2014   Procedure: Left Lumbar Five-Sacral One Microdiskectomy;  Surgeon: Kristeen Miss, MD;  Location: [place] NEURO ORS;  Service: Neurosurgery;  Laterality: Left;  Left L5-S1 Microdiskectomy  . MITRAL VALVE REPLACEMENT N/A 04/23/2017   Procedure: MITRAL VALVE (MV)  REPLACEMENT;  Surgeon: [name], MD;  Location: [place];  Service: Open Heart Surgery;  Laterality: N/A;  . PACEMAKER IMPLANT N/A 09/16/2017   Procedure: PACEMAKER IMPLANT;  Surgeon: Constance Haw, MD;  Location: [place] CV LAB;  Service: Cardiovascular;  Laterality: N/A;  . PELVIC FLOOR REPAIR    . POSTERIOR LUMBAR FUSION 4 LEVEL N/A 08/08/2015   Procedure: T8-L1 posterior lateral fusion with decompression T12-L1;  Surgeon: Kristeen Miss, MD;  Location: [place] NEURO ORS;  Service: Neurosurgery;  Laterality: N/A;  T8-L1 posterior lateral fusion with decompression T12-L1  . REDUCTION MAMMAPLASTY Bilateral   .  RIGHT/LEFT HEART CATH AND CORONARY ANGIOGRAPHY N/A 03/28/2017   Procedure: Right/Left Heart Cath and Coronary Angiography;  Surgeon: Nelva Bush, MD;  Location: [place] CV LAB;  Service: Cardiovascular;  Laterality: N/A;  . ROTATOR CUFF REPAIR Bilateral    11.12  . TEE WITHOUT CARDIOVERSION N/A 03/04/2017   Procedure: TRANSESOPHAGEAL ECHOCARDIOGRAM (TEE);  Surgeon: Fay Records, MD;  Location: [place];  Service: Cardiovascular;  Laterality: N/A;  . TEE WITHOUT CARDIOVERSION N/A 04/23/2017   Procedure: TRANSESOPHAGEAL ECHOCARDIOGRAM (TEE);  Surgeon: [name], MD;  Location: [place];  Service: Open Heart Surgery;  Laterality: N/A;  . TONSILLECTOMY AND ADENOIDECTOMY      reports that she has never smoked. She has never used smokeless tobacco. She reports that she does not drink alcohol or use drugs. family history includes Arthritis in her father and mother; Heart disease in her father; Heart failure in her father; Hyperlipidemia in her father and mother; Hypertension in her mother. Allergies  Allergen Reactions  . Cortisone Other (See Comments)    Insomnia, heart palpitations (po only)  . Statins Other (See Comments)    Myalgias   . Sulfamethoxazole Other (See Comments)    Unsure of reaction  . Codeine Swelling    FACIAL SWELLING SEVERITY UNKNOWN  . Amiodarone Other (See Comments)    Tremors with 400 mg BID   . Lactose Intolerance (Gi) Nausea And Vomiting      Review of Systems  Constitutional: Negative for appetite change and unexpected weight change.  Respiratory: Negative for shortness of breath.   Gastrointestinal: Negative for nausea and vomiting.  Endocrine: Negative for polydipsia and polyuria.       Objective:   Physical Exam  Constitutional: She appears well-developed and well-nourished.  Cardiovascular: Normal rate and regular rhythm.  Pulmonary/Chest: Effort normal and breath sounds normal.  Musculoskeletal: She exhibits no edema.        Assessment:     Type 2 diabetes with history of recent poor control but greatly improved after addition of long-acting insulin.  She has had a few blood sugars on the low side    Plan:     -Refills of Basaglar and ultrafine needles given -Continue to monitor blood sugars regularly.  If consistent fasting sugars less than 70 we have recommended that she reduce her glimepiride to half tablet daily -A1c was 8.9% which is not surprising since she only made changes a few weeks ago (with addition of insulin).  Suspect this will be much better at follow-up in 3 months -Touch base for any other concerns in the meantime  Eulas Post MD  Primary Care at [facility]

## 2018-07-09 NOTE — Patient Instructions (Signed)
Monitor blood sugars regularly  If fasting sugars consistently < 70, decrease the Glimepiride to one half tablet daily.

## 2018-07-10 ENCOUNTER — Telehealth: Payer: Self-pay | Admitting: Family Medicine

## 2018-07-10 MED ORDER — INSULIN DEGLUDEC 100 UNIT/ML ~~LOC~~ SOPN: 10.0000 [IU] | PEN_INJECTOR | Freq: Every day | SUBCUTANEOUS | 3 refills | Status: DC

## 2018-07-10 NOTE — Telephone Encounter (Signed)
Copied from [place] [id number]. Topic: Quick Communication - See Telephone Encounter >> Jul 10, 2018 12:21 PM Ivar Drape wrote: CRM for notification. See Telephone encounter for: 07/10/18. Patient stated [facility] said her present Insulin was not covered with them.  So she will need another prescription to be sent to them for a 90day supply for one of the following:  1) Levemir 2) Lantus 3) Toujeo 4) Etta Grandchild

## 2018-07-10 NOTE — Addendum Note (Signed)
Addended by: Westley Hummer B on: 07/10/2018 02:47 PM   Modules accepted: Orders

## 2018-07-10 NOTE — Telephone Encounter (Signed)
Routing back to provider. Pt needing a prescription that [facility] will cover.

## 2018-07-10 NOTE — Telephone Encounter (Signed)
Left detailed message on machine for patient.  Continue sample.  New Rx sent.

## 2018-07-10 NOTE — Telephone Encounter (Signed)
D/C Basaglar (she may finish sample she has).  OK to send in Tresiba 10 units [place] once daily for 90 days with  3 refills.

## 2018-07-14 ENCOUNTER — Ambulatory Visit: Payer: Medicare Other | Admitting: Internal Medicine

## 2018-07-14 ENCOUNTER — Encounter: Payer: Self-pay | Admitting: Internal Medicine

## 2018-07-14 VITALS — BP 110/64 | HR 61 | Ht 66.0 in | Wt 139.6 lb

## 2018-07-14 DIAGNOSIS — I5022 Chronic systolic (congestive) heart failure: Secondary | ICD-10-CM | POA: Diagnosis not present

## 2018-07-14 DIAGNOSIS — I080 Rheumatic disorders of both mitral and aortic valves: Secondary | ICD-10-CM | POA: Diagnosis not present

## 2018-07-14 DIAGNOSIS — I2510 Atherosclerotic heart disease of native coronary artery without angina pectoris: Secondary | ICD-10-CM

## 2018-07-14 LAB — LIPID PANEL
CHOLESTEROL TOTAL: 175 mg/dL (ref 100–199)
Chol/HDL Ratio: 4.2 ratio (ref 0.0–4.4)
HDL: 42 mg/dL (ref >39)
LDL CALC: 106 mg/dL — AB (ref 0–99)
TRIGLYCERIDES: 133 mg/dL (ref 0–149)
VLDL CHOLESTEROL CAL: 27 mg/dL (ref 5–40)

## 2018-07-14 LAB — CBC
Hematocrit: 33.9 % — ABNORMAL LOW (ref 34.0–46.6)
Hemoglobin: 11.1 g/dL (ref 11.1–15.9)
MCH: 30.9 pg (ref 26.6–33.0)
MCHC: 32.7 g/dL (ref 31.5–35.7)
MCV: 94 fL (ref 79–97)
Platelets: 304 10*3/uL (ref 150–450)
RBC: 3.59 x10E6/uL — ABNORMAL LOW (ref 3.77–5.28)
RDW: 13.4 % (ref 12.3–15.4)
WBC: 6.1 10*3/uL (ref 3.4–10.8)

## 2018-07-14 MED ORDER — METOPROLOL SUCCINATE ER 25 MG PO TB24: ORAL_TABLET | ORAL | 3 refills | Status: DC

## 2018-07-14 NOTE — Progress Notes (Signed)
Cardiology Office Note   Date:  07/14/2018   ID:  [name], DOB June 09, 1934, MRN [id number]  PCP:  Eulas Post, MD  Cardiologist:   Dorris Carnes, MD   F/U of  CHF and MV dz      History of Present Illness: [name] is a 84 y.o. female with a history of CAD and MVP  She is s/p CABG (SVG to RCA) and MVR with bioprostheiss and L atrial appendage clipping in Aug 2018.  Post op course significant for CHF with echo showing LVEF 25 to 30%.  Followed by CHF service  Required inotropic support.  Pt also developed atrial fibrillation  Treated with amiodarone and coumadin.   In September amiodarone was increased due to recurrent atrial fibrillation.   She was hsopitallied again in September 2018 with frecurrent atrial fib with RVR  Placed on milrinone for low output state.  Converted to SR with IV amiodaone.  Echo on 9.11.18 LVEF 35 to 40%  MVR functioning OK She was seen by B Bhagat in October  Found to be in a junctional rhythm Also had tremors Amiodarone was stopped. Seen by D Bensimhon on 07/10/17  Referred to EP for consideration of PPM since afib poorly tolerated The pt was seen by Elliot Cousin in November  Stated on metoprolol  Seen back on 12/20   Remained in junctional rhythm   Set up for PPM She presented to the hospital on 1/7 for PPM    Found to be in SR  Had possible UTI  I saw her in clinic in February SHe reported  fatigue, SOB with activity   Denies dizziness  No palpitations   I made no med changes at that visit  She was wearing monitor  She saw D Bensimhon on 10/24/17  He did recomm stopping anticoaguation   She was   Set up for echo  This was done on 3/22   LVEF was 50 to 55% Monitor showed no arrhythmia    I saw her back in June 2019  Since seen she is taking activity as tolerated   Deneis CP   Breathing is stable   Deneis PND   No edema   No palptiatoins    Current Meds  Medication Sig  . aspirin EC 81 MG tablet Take 1 tablet (81 mg total) by mouth daily.  [name]  BIOTIN PO Take by mouth.  . bisacodyl (DULCOLAX) 5 MG EC tablet Take 10 mg by mouth daily as needed (for constipation.).  [name] esomeprazole (NEXIUM) 20 MG capsule Take 20 mg by mouth daily at 12 noon.  . famotidine (PEPCID) 20 MG tablet Take 20 mg by mouth daily.   . fluticasone (FLONASE) 50 MCG/ACT nasal spray SHAKE LIQUID AND USE 2 SPRAYS IN EACH NOSTRIL DAILY AS NEEDED FOR ALLERGIES  . glimepiride (AMARYL) 4 MG tablet Take 1 tablet (4 mg total) by mouth daily before breakfast.  . glucose blood (ONETOUCH VERIO) test strip TEST BLOOD SUGAR TWICE DAILY. DXE11.9  . insulin degludec (TRESIBA) 100 UNIT/ML SOPN FlexTouch Pen Inject 0.1 mLs (10 Units total) into the skin daily.  . Insulin Pen Needle (B-D UF III MINI PEN NEEDLES) 31G X 5 MM MISC Use once daily for insulin injection  . metFORMIN (GLUCOPHAGE) 500 MG tablet Take 2 tablets (1,000 mg total) by mouth 2 (two) times daily with a meal.  . metoprolol succinate (TOPROL-XL) 25 MG 24 hr tablet TAKE 1 TABLET BY MOUTH  EVERY EVENING.  TAKE WITH OR IMMEDIATELY FOLLOWING A  MEAL.  . Multiple Vitamins-Minerals (ICAPS AREDS 2) CAPS Take 1 capsule by mouth 2 (two) times daily.  Glory Rosebush DELICA LANCETS 38S MISC USE TO TEST TWICE DAILY. DX E11.9  . PREMARIN vaginal cream INSERT 1 APPLICATORFUL VAGINALLY DAILY  . sacubitril-valsartan (ENTRESTO) 24-26 MG Take 1 tablet by mouth 2 (two) times daily.  [name] zolpidem (AMBIEN) 5 MG tablet Take 1 tablet (5 mg total) by mouth at bedtime as needed for sleep.     Allergies:   Cortisone; Statins; Sulfamethoxazole; Codeine; Amiodarone; and Lactose intolerance (gi)   Past Medical History:  Diagnosis Date  . Allergy   . Anxiety   . Arthritis   . Atrial enlargement, left   . CHF (congestive heart failure) ([place])   . Coronary artery disease   . Depression   . Diabetes mellitus without complication (HCC)    fasting cbg 110s  . Dyspnea   . GERD (gastroesophageal reflux disease)   . H/O hiatal hernia   .  Headache(784.0)   . Heart murmur    06/10/2014 seeing new cardiologist  . Heart murmur   . HOH (hard of hearing)    HOH in left ear; needs to speak to patient in right ear  . Hyperlipidemia   . Hypertension   . Mitral regurgitation   . Mitral valve prolapse   . Palpitations    afib on 03/04/17 EKG  . PONV (postoperative nausea and vomiting)    Patient stated "I like the patch behind my ear"  . 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 S/P mitral valve replacement with bioprosthetic valve 04/23/2017   [facility] Mitral bovine pericardial tissue valve: Model 7300TFX, Serial [id number], size 31  . UTI (lower urinary tract infection)     Past Surgical History:  Procedure Laterality Date  . ABDOMINAL HYSTERECTOMY    . Anterior posterior and enterocele repairs  09/21/2004   With uterosacral cardinal colposuspension, partial colpocleisis; Selinda Orion, MD  . BACK SURGERY    . BREAST ENHANCEMENT SURGERY  02/25/2002   Bilateral reduction and excision of accessory breast tissue underneath the left breast; Aretha Parrot., MD  . BREAST SURGERY  11   reduction  . CARDIAC CATHETERIZATION     no PCI  . CARDIAC CATHETERIZATION  05/02/2017  . CATARACT EXTRACTION Bilateral   . CLIPPING OF ATRIAL APPENDAGE  04/23/2017   Procedure: CLIPPING OF LEFT ATRIAL APPENDAGE;  Surgeon: [name], MD;  Location: [place];  Service: Open Heart Surgery;;  . COLONOSCOPY    . CORONARY ARTERY BYPASS GRAFT N/A 04/23/2017   Procedure: CORONARY ARTERY BYPASS GRAFTING (CABG), ON PUMP, TIMES ONE, USING ENDOSCOPICALLY HARVESTED RIGHT GREATER SAPHENOUS VEIN;  Surgeon: [name], MD;  Location: [place];  Service: Open Heart Surgery;  Laterality: N/A;  . EYE SURGERY Bilateral    cataract removal  . LUMBAR LAMINECTOMY/ DECOMPRESSION WITH MET-RX N/A 03/16/2014   Procedure: LUMBAR FIVE-SACRAL ONE EXTRAFORAMINAL DISKECTOMY WITH METREX;  Surgeon: Kristeen Miss, MD;  Location: [place] NEURO  [facility];  Service: Neurosurgery;  Laterality: N/A;  . LUMBAR LAMINECTOMY/DECOMPRESSION MICRODISCECTOMY Left 06/08/2014   Procedure: Left Lumbar Five-Sacral One Microdiskectomy;  Surgeon: Kristeen Miss, MD;  Location: [place] [facility]URO ORS;  Service: Neurosurgery;  Laterality: Left;  Left L5-S1 Microdiskectomy  . MITRAL VALVE REPLACEMENT N/A 04/23/2017   Procedure: MITRAL VALVE (MV) REPLACEMENT;  Surgeon: [name], MD;  Location:  [facility] OR;  Service: Open Heart Surgery;  Laterality: N/A;  . PACEMAKER IMPLANT N/A 09/16/2017   Procedure: PACEMAKER IMPLANT;  Surgeon: Constance Haw, MD;  Location: [place] CV LAB;  Service: Cardiovascular;  Laterality: N/A;  . PELVIC FLOOR REPAIR    . POSTERIOR LUMBAR FUSION 4 LEVEL N/A 08/08/2015   Procedure: T8-L1 posterior lateral fusion with decompression T12-L1;  Surgeon: Kristeen Miss, MD;  Location: [place] NEURO ORS;  Service: Neurosurgery;  Laterality: N/A;  T8-L1 posterior lateral fusion with decompression T12-L1  . REDUCTION MAMMAPLASTY Bilateral   . RIGHT/LEFT HEART CATH AND CORONARY ANGIOGRAPHY N/A 03/28/2017   Procedure: Right/Left Heart Cath and Coronary Angiography;  Surgeon: Nelva Bush, MD;  Location: [place] CV LAB;  Service: Cardiovascular;  Laterality: N/A;  . ROTATOR CUFF REPAIR Bilateral    11.12  . TEE WITHOUT CARDIOVERSION N/A 03/04/2017   Procedure: TRANSESOPHAGEAL ECHOCARDIOGRAM (TEE);  Surgeon: Fay Records, MD;  Location: [facility];  Service: Cardiovascular;  Laterality: N/A;  . TEE WITHOUT CARDIOVERSION N/A 04/23/2017   Procedure: TRANSESOPHAGEAL ECHOCARDIOGRAM (TEE);  Surgeon: [name], MD;  Location: [facility];  Service: Open Heart Surgery;  Laterality: N/A;  . TONSILLECTOMY AND ADENOIDECTOMY       Social History:  The patient  reports that she has never smoked. She has never used smokeless tobacco. She reports that she does not drink alcohol or use drugs.   Family History:  The patient's family history includes Arthritis in  her father and mother; Heart disease in her father; Heart failure in her father; Hyperlipidemia in her father and mother; Hypertension in her mother.    ROS:  Please see the history of present illness. All other systems are reviewed and  Negative to the above problem except as noted.    PHYSICAL EXAM: VS:  BP 110/64   Pulse 61   Ht '5\' 6"'  (1.676 m)   Wt 139 lb 9.6 oz (63.3 kg)   SpO2 99%   BMI 22.53 kg/m   GEN:  Thin 84 yo in no acute distress  HEENT: normal  Neck: JVP is not elevated  Cardiac: RRR; IIVI systolic murmur left lateral chest No rub, or gallops,Trivial  LE edema  Respiratory:  Rel clear  No rales   GI: soft, nontender, nondistended, + BS  No hepatomegaly  MS: no deformity Moving all extremities   Skin: warm and dry, no rash Neuro:  Strength and sensation are intact Psych: euthymic mood, full affect   EKG:  EKG is not ordered today.    Lipid Panel    Component Value Date/Time   CHOL 163 08/22/2016 0746   TRIG 101 08/22/2016 0746   HDL 65 08/22/2016 0746   CHOLHDL 2.5 08/22/2016 0746   VLDL 20 08/22/2016 0746   LDLCALC 78 08/22/2016 0746   LDLDIRECT 143.6 06/04/2012 0931      Wt Readings from Last 3 Encounters:  07/14/18 139 lb 9.6 oz (63.3 kg)  07/09/18 139 lb 14.4 oz (63.5 kg)  06/11/18 137 lb 3.2 oz (62.2 kg)      ASSESSMENT AND PLAN:  1  Chronic systolic CHF.LVEF is low normal   Patient always appears fatigued   Will check CBC   Encouraged her to stay active  Volume looks good   2  Rhythm  / Hx atrial fib   CLiically she is in SR  Atrial fib occurred immediately postop   She has had no recurrences   Reasonale to hold anticoagulation  3  CAD  S/p 1 V CABG  NO symptoms of angina  4  MV disease  S/P MVR  Trace MR on echo in September 2018  Will get CBC and lipids   She is not on a statin    F/U inMarch    Current medicines are reviewed at length with the patient today.  The patient does not have concerns regarding  medicines.  Signed, Dorris Carnes, MD  07/14/2018 10:21 AM    [address], [place], [place]  [address] Phone: [phone]; Fax: [phone]

## 2018-07-14 NOTE — Telephone Encounter (Signed)
**Note De-Identified [name] Obfuscation** Letter received from [employer] pt asst [employer] Jameel Quant fax stating that they have approved the pt for pt asst with Entresto. The letter also states that they will re-evaluate the pts eligibility for the NPAF at the end of the calender year to determine if they qualify for continued asst.

## 2018-07-14 NOTE — Patient Instructions (Signed)
Medication Instructions:  Your physician recommends that you continue on your current medications as directed. Please refer to the Current Medication list given to you today.  If you need a refill on your cardiac medications before your next appointment, please call your pharmacy.   Lab work: TODAY: lipids, cbc  If you have labs (blood work) drawn today and your tests are completely normal, you will receive your results only by: [name] MyChart Message (if you have MyChart) OR . A paper copy in the mail If you have any lab test that is abnormal or we need to change your treatment, we will call you to review the results.  Testing/Procedures: none  Follow-Up: At [facility], you and your health needs are our priority.  As part of our continuing mission to provide you with exceptional heart care, we have created designated Provider Care Teams.  These Care Teams include your primary Cardiologist (physician) and Advanced Practice Providers (APPs -  Physician Assistants and Nurse Practitioners) who all work together to provide you with the care you need, when you need it. You will need a follow up appointment in:  5 months.  Please call our office 2 months in advance to schedule this appointment.  You may see Dorris Carnes, MD or one of the following Advanced Practice Providers on your designated Care Team: Richardson Dopp, PA-C [place], [place] . Daune Perch, NP  Any Other Special Instructions Will Be Listed Below (If Applicable).

## 2018-07-15 NOTE — Telephone Encounter (Signed)
**Note De-Identified Haru Anspaugh Obfuscation** July 14, 2018        8:24 AM  Note    Letter received from [employer] pt asst [employer] Danyale Ridinger fax stating that they have approved the pt for pt asst with Entresto. The letter also states that they will re-evaluate the pts eligibility for the NPAF at the end of the calender year to determine if they qualify for continued asst.

## 2018-07-22 ENCOUNTER — Other Ambulatory Visit: Payer: Self-pay | Admitting: *Deleted

## 2018-07-22 DIAGNOSIS — E782 Mixed hyperlipidemia: Secondary | ICD-10-CM

## 2018-07-22 MED ORDER — EZETIMIBE 10 MG PO TABS: 10.0000 mg | ORAL_TABLET | Freq: Every day | ORAL | 3 refills | Status: DC

## 2018-07-22 NOTE — Progress Notes (Signed)
Reviewed lab work with patient. Reviewed recommendations for Zetia once a day.  Reviewed ways to reduce saturated fats in diet. Will send prescription to [facility], 90 day supply. Scheduled return lab appointment for 10/28/17 for fasting lipids.  Copy of results mailed to patient and forwarded to PCP per pt request.

## 2018-07-31 ENCOUNTER — Other Ambulatory Visit: Payer: Self-pay | Admitting: Family Medicine

## 2018-08-01 NOTE — Telephone Encounter (Signed)
Last rx given on 9/23 for #30 with 1 ref

## 2018-08-12 ENCOUNTER — Telehealth: Payer: Self-pay | Admitting: Internal Medicine

## 2018-08-12 NOTE — Telephone Encounter (Signed)
New message:   Patient calling concerning her medication. Please call Patient at home.

## 2018-08-12 NOTE — Telephone Encounter (Signed)
Follow up: ° ° °Patient returning call back °

## 2018-08-12 NOTE — Telephone Encounter (Signed)
Tried to call pt back though no answer or vm came on.

## 2018-08-12 NOTE — Telephone Encounter (Signed)
S/w pt who is concerned that her cholesterol is too high, states Dr. Alan Ripper nurse Caren Hazy went over lab results recently. States she was advised per Dr. Harrington Challenger recommends to start Zetia 10 mg daily. Rx was sent to [facility]. Pt did receive Zetia however, states she has decided she would like to try to go back on a better statin to help make sure her cholesterol goes down. Pt states she was recently on Fenofibrate which was d/c'd due to myalgia's.   Pt states she is moving soon and does have a f/u with Daune Perch, NP 09/2018 as a one last time visit before moving, though was not able to get in to see Dr. Harrington Challenger. I advised pt I will let Dr. Harrington Challenger know of her request to resume a statin and we will call her once Dr. Harrington Challenger has given Rx orders. Pt asked for the new Rx to go to [facility]. Pt would lowest dose of statin that Dr. Harrington Challenger feels would be appropriate for her. Pt thanked me for the call back and my help.

## 2018-08-13 NOTE — Telephone Encounter (Signed)
Would give trial of crestor 2.5 mg   Take every other day    F/U lipids in 2 months to see response and how tolerates

## 2018-08-14 MED ORDER — ROSUVASTATIN CALCIUM 5 MG PO TABS: 2.5000 mg | ORAL_TABLET | ORAL | 3 refills | Status: DC

## 2018-08-14 NOTE — Addendum Note (Signed)
Addended by: Rodman Key on: 08/14/2018 03:43 PM   Modules accepted: Orders

## 2018-08-14 NOTE — Telephone Encounter (Signed)
Informed patient of recommendations to try Crestor 2.5 mg every other day.  Prescription sent to [facility] Appointment made with Dr. Harrington Challenger Jan 10.  Pt moving to [facility] with her son/daughter in law end of Jan.

## 2018-08-19 ENCOUNTER — Ambulatory Visit: Payer: Self-pay | Admitting: Sports Medicine

## 2018-08-21 ENCOUNTER — Other Ambulatory Visit: Payer: Self-pay | Admitting: *Deleted

## 2018-08-21 IMAGING — CT CT ANGIO CHEST
3 of 7 series · 15 of 36 positions shown · IV contrast (isovue)
Comparison: Prior CT scan of the lumbar spine 05/25/2014

CLINICAL DATA: 83-year-old female undergoing preoperative
evaluation prior to mitral valve replacement and CABG on April 23, 2017

EXAM:
CT ANGIOGRAPHY CHEST, ABDOMEN AND PELVIS
TECHNIQUE: Multidetector CT imaging through the chest, abdomen and pelvis was
performed using the standard protocol during bolus administration of
intravenous contrast. Multiplanar reconstructed images and MIPs were
obtained and reviewed to evaluate the vascular anatomy.
CONTRAST:  75 mL Isovue 370

[Series 2: unenhanced · axial · 0.70mm/px · z∈[-179,-74]mm · 2 of 64 slices shown]
[im 22/64  lung]
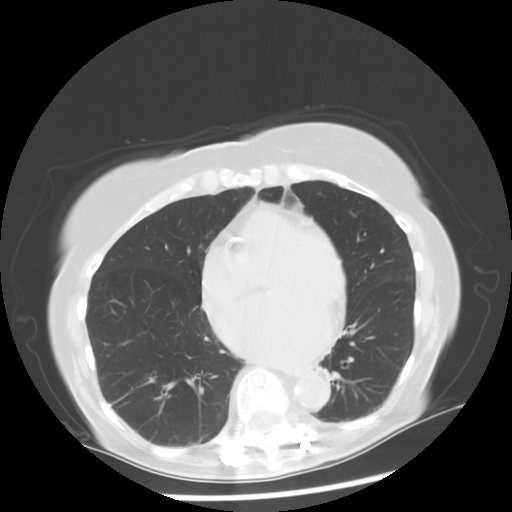
[im 43/64  lung]
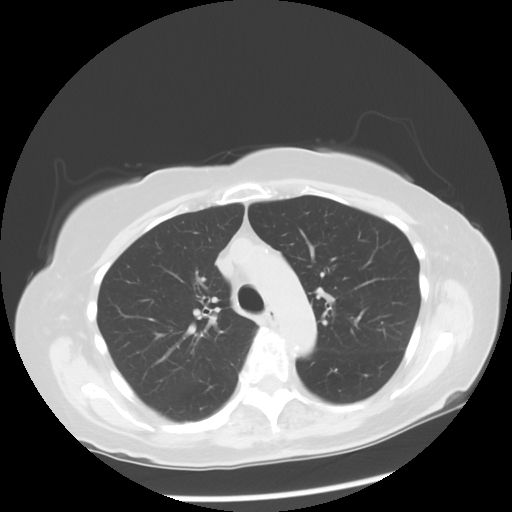

[Series 4: angio · axial · 0.70mm/px · z∈[-445,-58]mm · 8 of 201 slices shown]
[im 23/201  lung]
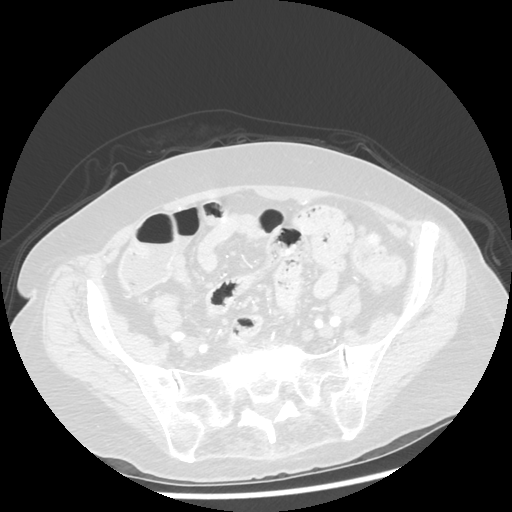
[im 45/201  mediastinal]
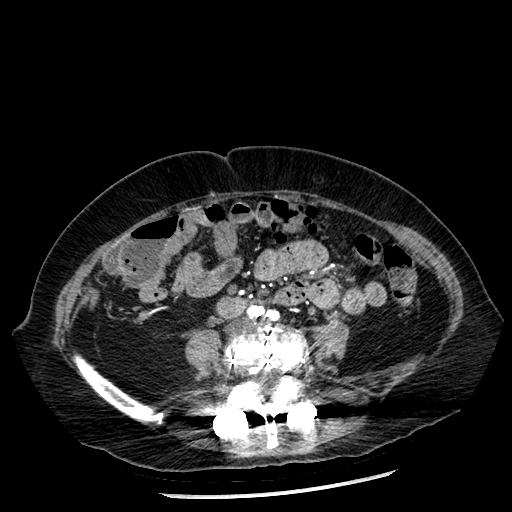
[im 67/201  lung]
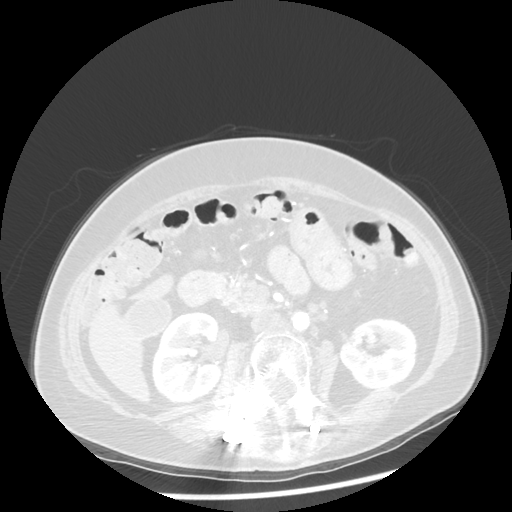
[im 89/201  mediastinal]
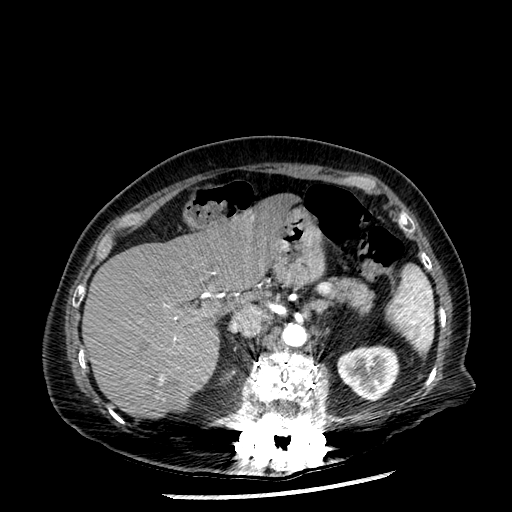
[im 112/201  lung]
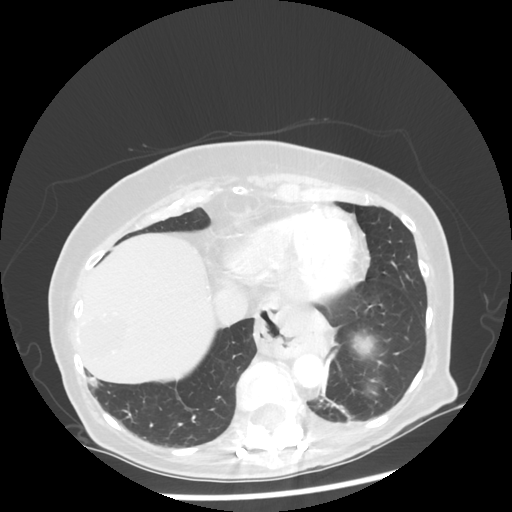
[im 134/201  mediastinal]
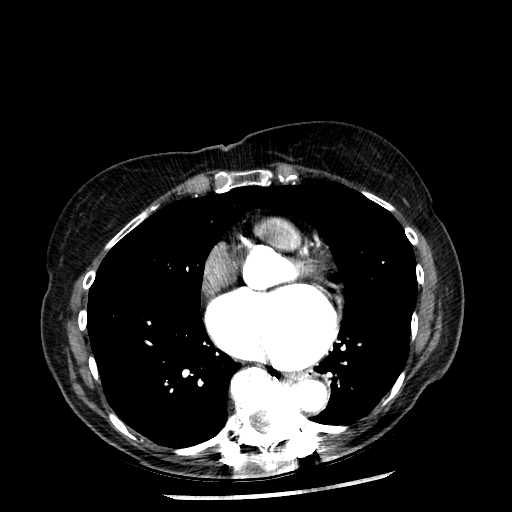
[im 156/201  lung]
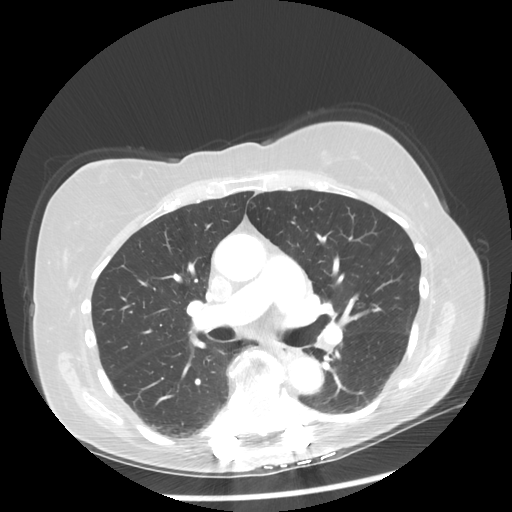
[im 178/201  mediastinal]
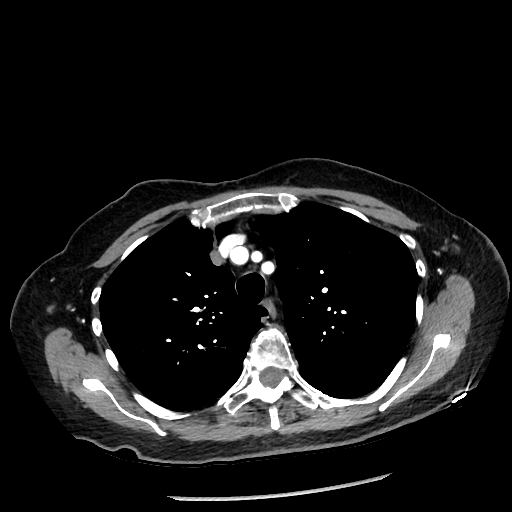

[Series 602: sagittal body · sagittal · 0.98mm/px · 5 of 145 slices shown]
[im 25/145  lung]
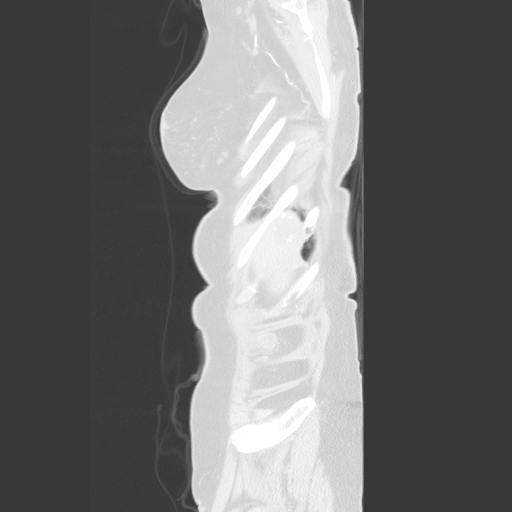
[im 49/145  lung]
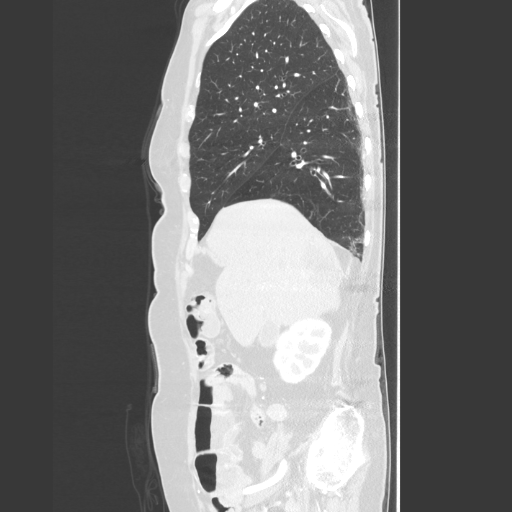
[im 73/145  lung]
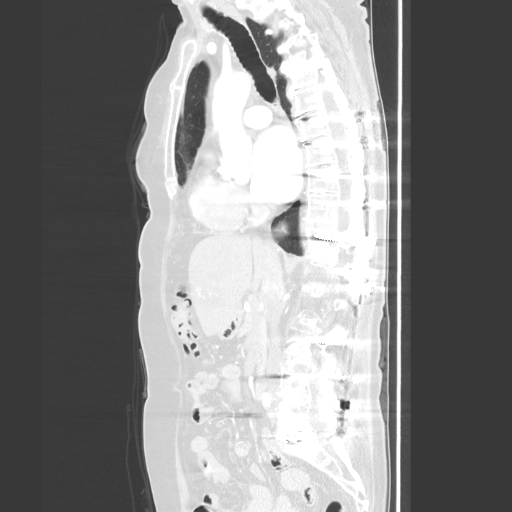
[im 97/145  lung]
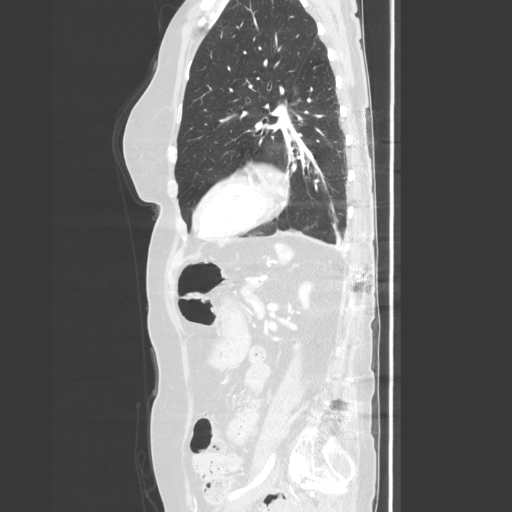
[im 121/145  lung]
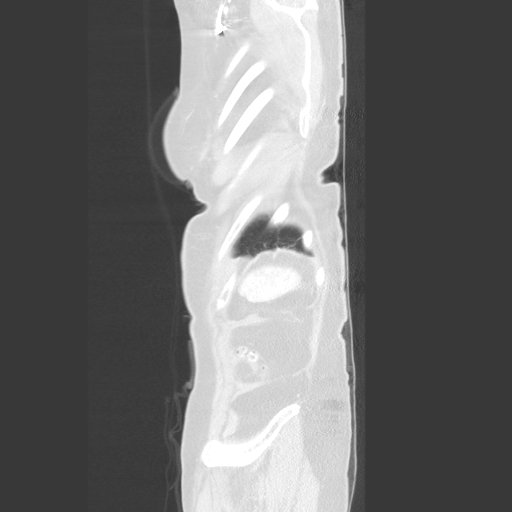

[15 of 36 positions shown; findings below may reference images not displayed]

FINDINGS: CTA CHEST FINDINGS

Cardiovascular: Conventional 3 vessel aortic arch. Mild ectasia of
the ascending thoracic aorta to 3.5 cm. The aortic root is normal in
caliber. The arch and descending thoracic aorta are also normal in
caliber. There is mild atherosclerotic vascular calcification. Trace
calcifications are also present at the left main, left anterior
descending, circumflex and right coronary arteries. Marked left
atrial dilatation. Thickened mitral valve. The remainder the cardiac
structures are normal in size. No pericardial effusion.

Mediastinum/Nodes: Unremarkable CT appearance of the thyroid gland.
No suspicious mediastinal or hilar adenopathy. No soft tissue
mediastinal mass. Moderately large sliding hiatal hernia.

Lungs/Pleura: Lungs are clear. No pleural effusion or pneumothorax.

Musculoskeletal: No acute osseous abnormality. Long segment
posterior thoracic fusion.

Review of the MIP images confirms the above findings.

CTA ABDOMEN AND PELVIS FINDINGS

VASCULAR

Aorta: Atherosclerotic vascular calcifications without evidence of
aneurysm or dissection.

Celiac: Widely patent.  No aneurysm or dissection.

SMA: Widely patent.  No aneurysm or dissection.

Renals: Single right renal artery. Mild atherosclerotic plaque at
the origin with perhaps mild stenosis. Dominant left renal artery is
also widely patent. There is a small accessory artery to the right
lower pole. No changes of fibromuscular dysplasia.

IMA: Patent with normal antegrade flow.

Inflow: Minimal heterogeneous atherosclerotic plaque in the proximal
common iliac arteries. No significant stenosis. The internal iliac
arteries are patent. The external iliac arteries are spared from any
disease.

Veins: No focal venous abnormality.

Review of the MIP images confirms the above findings.

NON-VASCULAR

Hepatobiliary: Low-attenuation lesion with peripheral incomplete
nodular enhancement in the hepatic dome measures 4.3 x 3.3 cm and is
consistent with a benign hemangioma. There is a second
similar-appearing lesion in the posterior aspect of hepatic segment
7 which measures up to 5 cm in diameter. A third smaller lesion is
also present in hepatic segment 7 and measures only 1.4 cm. Another
similar lesion in the left hepatic lobe measures up to 1.4 cm.
Finally, there is a circumscribed nonenhancing cystic lesion in
hepatic segment 3 which measures 2.4 cm in diameter. No evidence of
biliary ductal dilatation. Cholelithiasis.

Pancreas: Unremarkable. No pancreatic ductal dilatation or
surrounding inflammatory changes.

Spleen: Normal in size without focal abnormality.

Adrenals/Urinary Tract: Normal adrenal glands. No evidence of
hydronephrosis or enhancing renal mass. Nonobstructing 3- 4 mm stone
in the lower pole of the left kidney. Adjacent punctate 1 mm stone
also in the lower pole of the left kidney. The ureters are
unremarkable.

Stomach/Bowel: No evidence of bowel obstruction or focal bowel wall
thickening. Colonic diverticular disease without CT evidence of
active inflammation.

Lymphatic: No suspicious lymphadenopathy.

Reproductive: Status post hysterectomy. No adnexal masses.

Other: Small fat containing umbilical hernia. No abdominopelvic
ascites.

Musculoskeletal: No acute osseous abnormality. Extensive multi level
thoraco lumbar fusion extending from T7 through S1.

Review of the MIP images confirms the above findings.
IMPRESSION: CTA CHEST

1. No evidence of acute aortic abnormality.
2. Mild ectasia of the ascending thoracic aorta with a maximal
diameter of 3.5 cm.
3. Coronary and aortic atherosclerotic calcifications. Aortic
Atherosclerosis ([id number]-170.0)
4. Thickened mitral valve with prominent dilatation of the left
atrium consistent with the clinical history of mitral valve disease.
5. Moderately large sliding hiatal hernia.

CTA ABD/PELVIS

1. No evidence of abdominal aortic aneurysm or acute aortic
abnormality.
2. Scattered atherosclerotic vascular calcifications.
3. Multiple hepatic lesions the appearance of which are most
consistent with a combination of a combination of benign hemangiomas
and a simple cyst.
4. Cholelithiasis.
5. Colonic diverticular disease without CT evidence of active
inflammation.
6. Nonobstructing left lower pole nephrolithiasis.
7. Small fat containing umbilical hernia.
8. Extensive multilevel thoracolumbar posterior fusion extending
from T7 through S1.

## 2018-08-21 MED ORDER — ROSUVASTATIN CALCIUM 5 MG PO TABS: 2.5000 mg | ORAL_TABLET | ORAL | 3 refills | Status: DC

## 2018-08-26 ENCOUNTER — Ambulatory Visit: Payer: Self-pay | Admitting: Sports Medicine

## 2018-08-31 IMAGING — DX DG CHEST 1V PORT
1 series · 1 of 1 positions shown · non-contrast
Comparison: 04/24/2017

CLINICAL DATA: Follow-up chest tube

EXAM:
PORTABLE CHEST 1 VIEW

[chest ap]
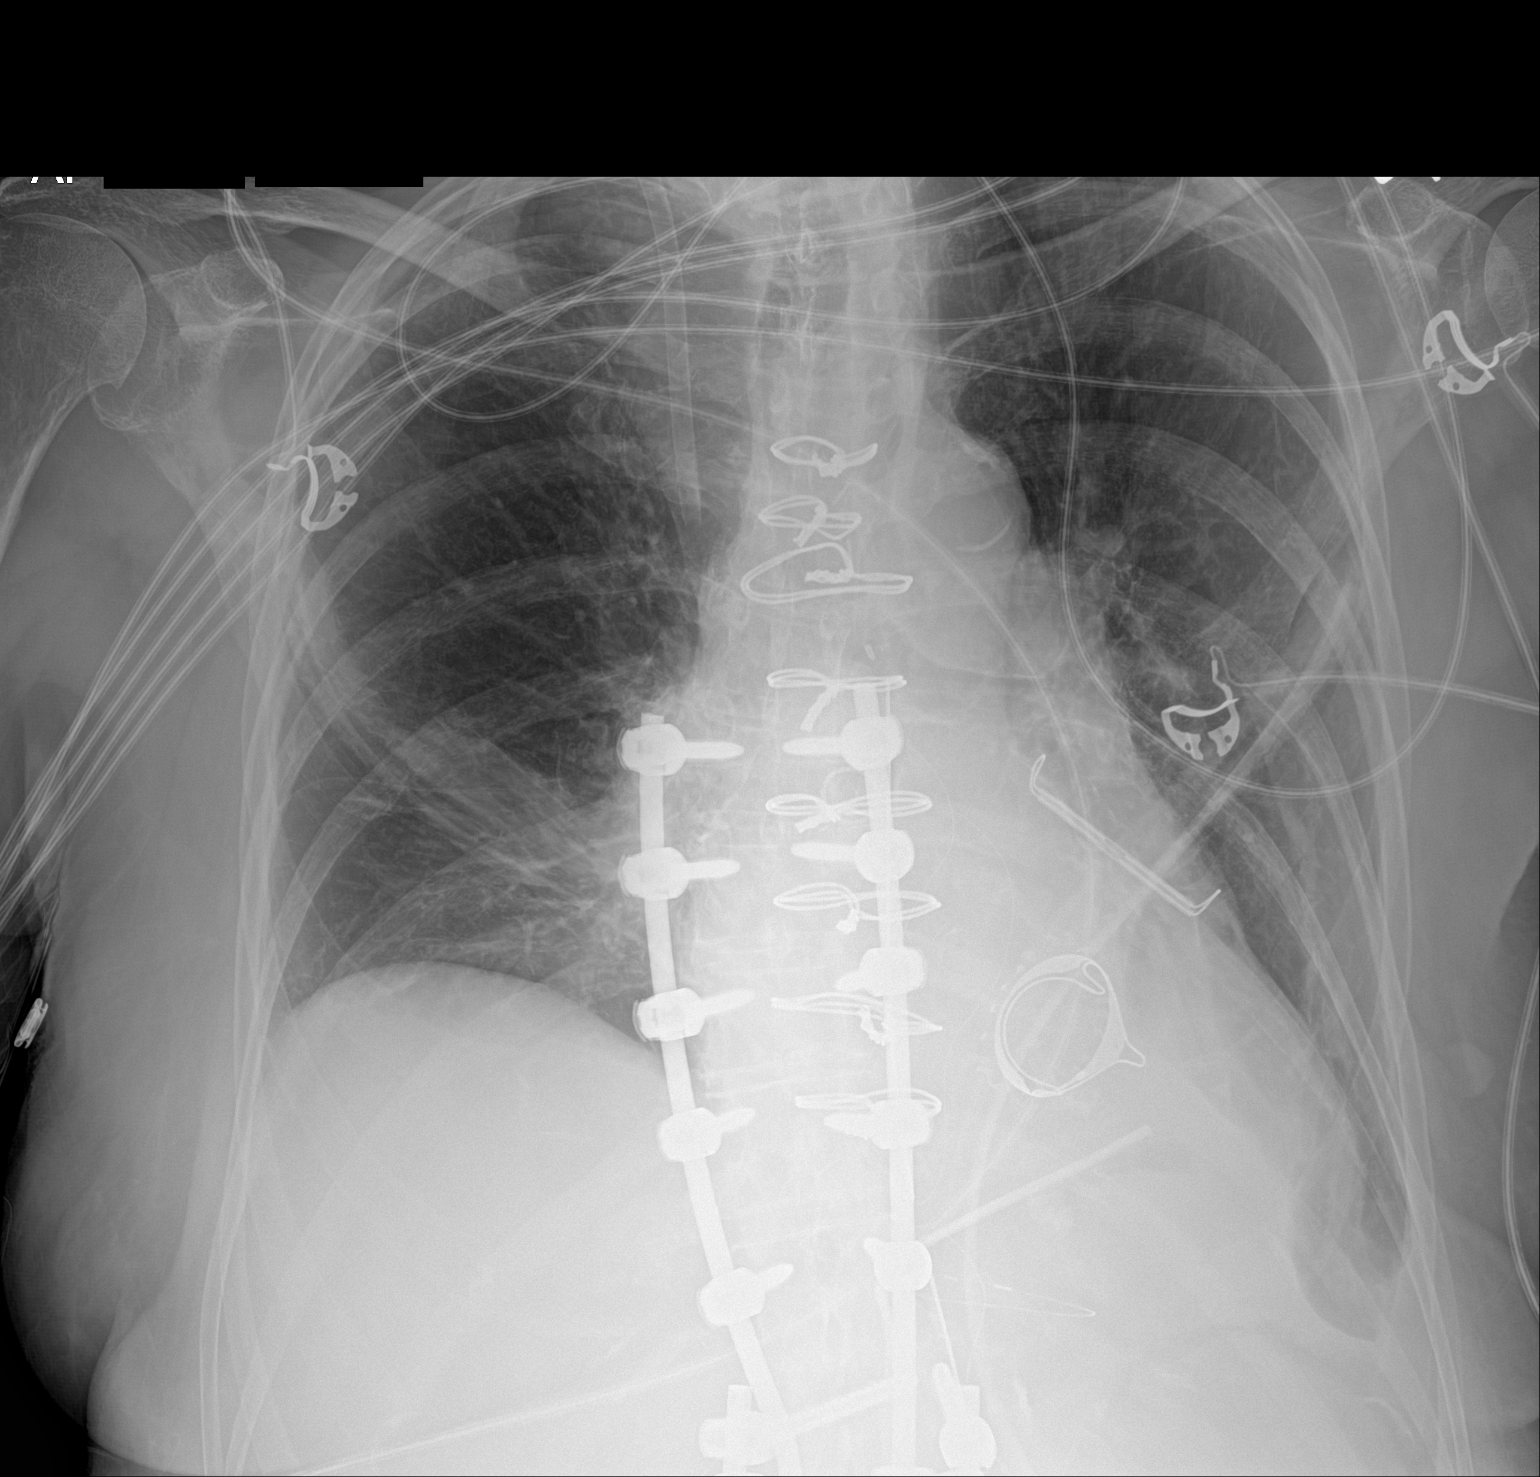

[1 of 1 positions shown; findings below may reference images not displayed]

FINDINGS: Postsurgical changes are again identified. A left-sided thoracostomy
catheter is seen. No pneumothorax is noted. Pericardial drain is
again noted. Some right basilar atelectasis is seen new from the
prior exam. Right jugular sheath remains in place.
IMPRESSION: New right basilar atelectasis.

## 2018-09-02 IMAGING — CR DG CHEST 1V PORT
1 series · 1 of 1 positions shown · non-contrast
Comparison: Yesterday

CLINICAL DATA: Chest tube

EXAM:
PORTABLE CHEST 1 VIEW

[AP]
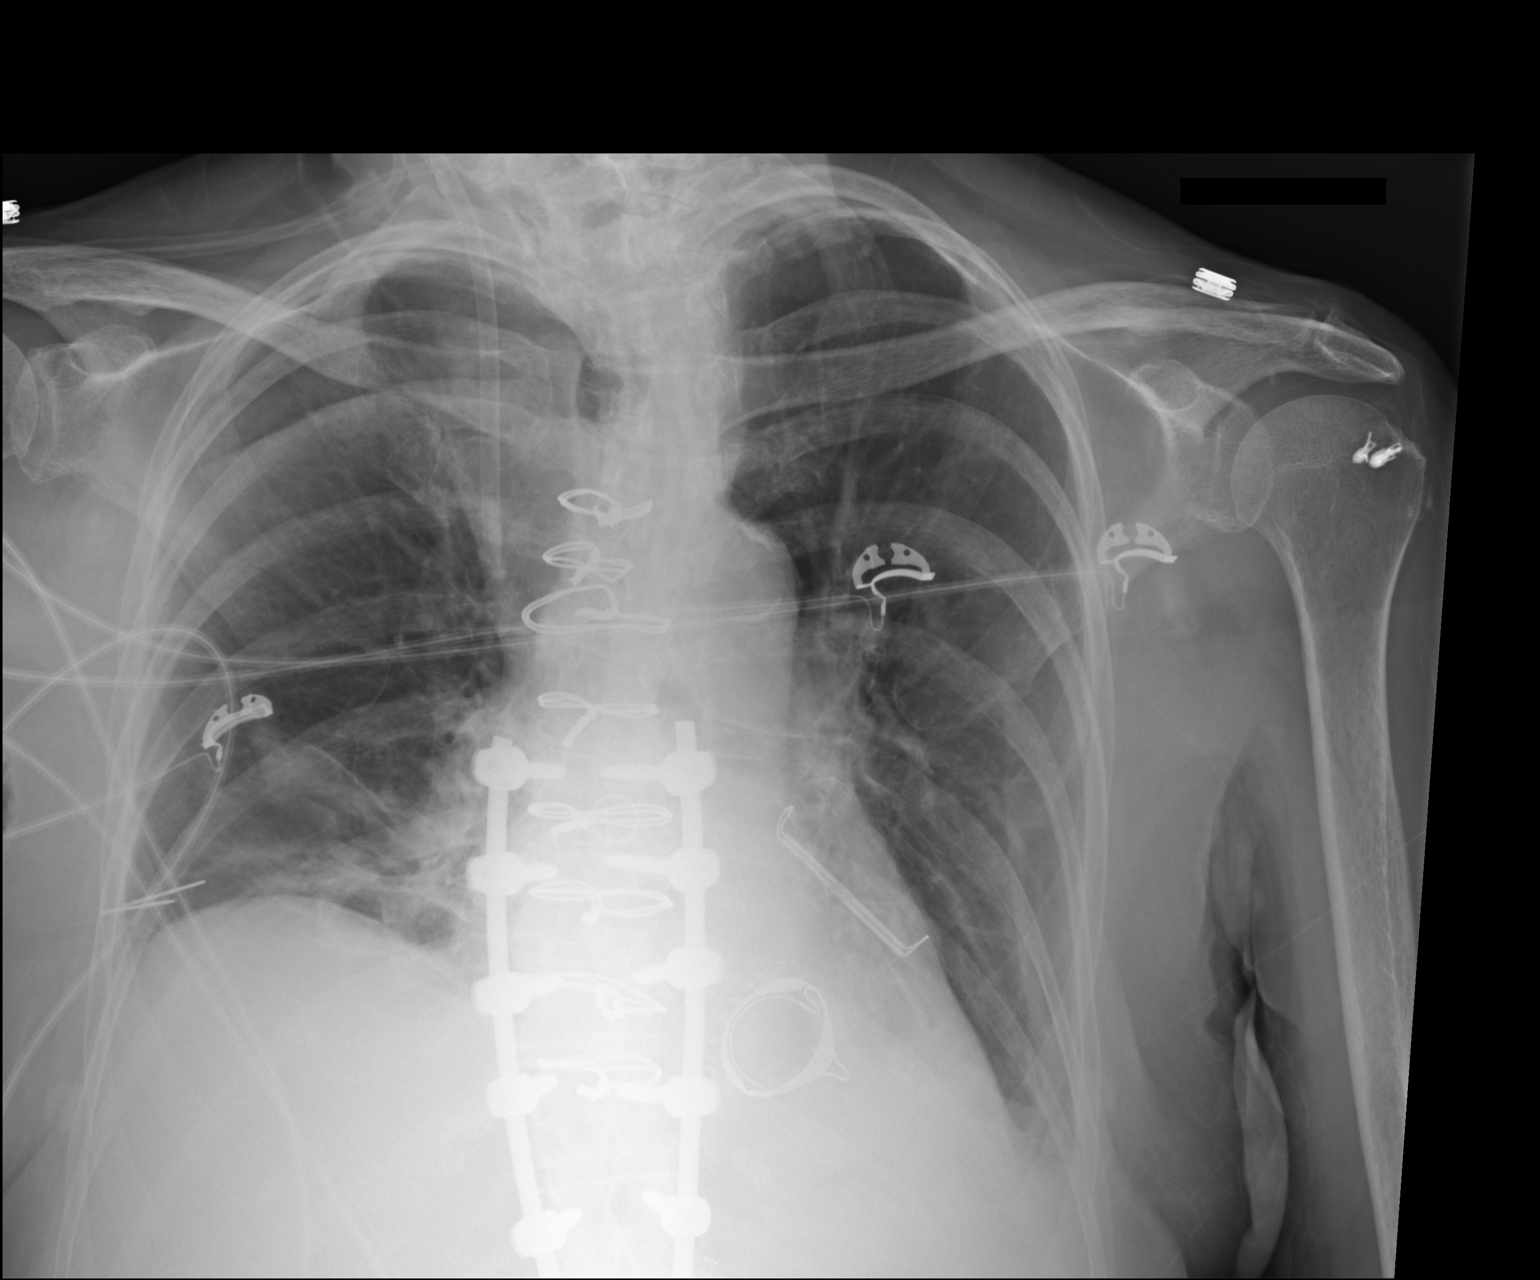

[1 of 1 positions shown; findings below may reference images not displayed]

FINDINGS: Right IJ sheath in stable position. Mildly increased atelectasis
with lower lung volumes. Trace pleural effusions. Stable
postoperative heart size with changes of mitral valve replacement,
CABG, and left atrial clipping.
IMPRESSION: Mildly increased atelectasis.

## 2018-09-03 IMAGING — CR DG CHEST 1V PORT
1 series · 1 of 1 positions shown · non-contrast
Comparison: April 27, 2017

CLINICAL DATA: Chest tube placement.

EXAM:
PORTABLE CHEST 1 VIEW

[AP]
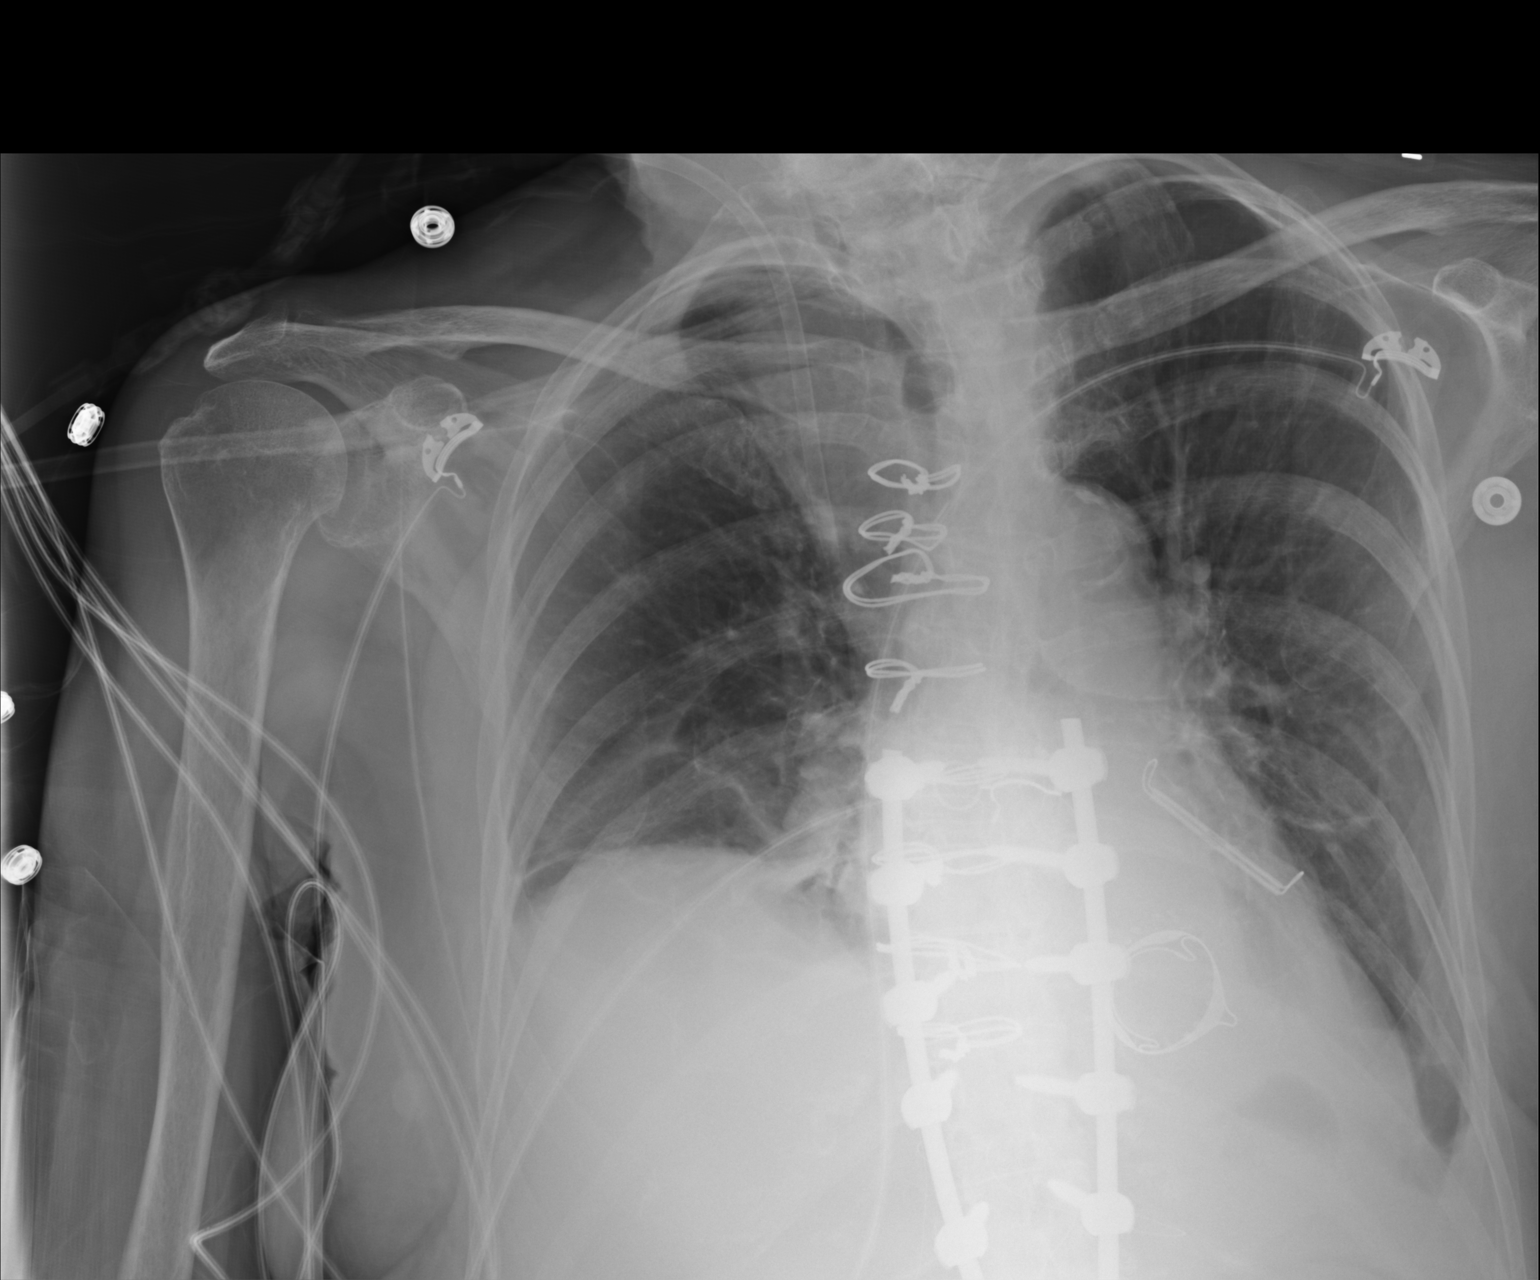

[1 of 1 positions shown; findings below may reference images not displayed]

FINDINGS: Stable support apparatus. Mild atelectasis in the right base.
Probable tiny effusion and atelectasis in the left base. No other
interval changes or acute abnormalities.
IMPRESSION: No significant interval change in the small left effusion and
bibasilar atelectasis.

## 2018-09-09 DIAGNOSIS — L738 Other specified follicular disorders: Secondary | ICD-10-CM | POA: Diagnosis not present

## 2018-09-09 DIAGNOSIS — L820 Inflamed seborrheic keratosis: Secondary | ICD-10-CM | POA: Diagnosis not present

## 2018-09-09 DIAGNOSIS — L570 Actinic keratosis: Secondary | ICD-10-CM | POA: Diagnosis not present

## 2018-09-16 ENCOUNTER — Encounter: Payer: Self-pay | Admitting: Internal Medicine

## 2018-09-16 ENCOUNTER — Ambulatory Visit: Payer: Medicare Other | Admitting: Internal Medicine

## 2018-09-16 VITALS — BP 100/60 | HR 65 | Ht 66.0 in | Wt 139.4 lb

## 2018-09-16 DIAGNOSIS — E782 Mixed hyperlipidemia: Secondary | ICD-10-CM

## 2018-09-16 DIAGNOSIS — I2510 Atherosclerotic heart disease of native coronary artery without angina pectoris: Secondary | ICD-10-CM | POA: Diagnosis not present

## 2018-09-16 MED ORDER — ROSUVASTATIN CALCIUM 10 MG PO TABS: 10.0000 mg | ORAL_TABLET | Freq: Every day | ORAL | 3 refills | Status: DC

## 2018-09-16 NOTE — Progress Notes (Signed)
Cardiology Office Note   Date:  09/16/2018   ID:  MICHEL ESKELSON, DOB 1934/04/24, MRN [id number]  PCP:  Eulas Post, MD  Cardiologist:   Dorris Carnes, MD   F/U of  CHF and MV dz      History of Present Illness: [name] is a 85 y.o. female with a history of CAD and MVP  She is s/p CABG (SVG to RCA) and MVR with bioprostheiss and L atrial appendage clipping in Aug 2018.  Post op course significant for CHF with echo showing LVEF 25 to 30%.  Followed by CHF service  Required inotropic support.  Pt also developed atrial fibrillation  Treated with amiodarone This was stopped when she developed junctional rhythm   She remained in this for awhile but was back in SR in Jan 2019  ANticoagulation discontinued   Repeat echo in March 2019 LVEF was 50 to 55%  MV prosthesis was functioning normally    I saw her in June and then November 2019    She returns today because she will be moving to [place] soon  Since seen she is doing OK   Breathing is OK   No CP   No dizziness   No palpitations  She has resumed Crestor and says she is tolerating   Just started at 5 mg   Alos on Zetia    Current Meds  Medication Sig  . aspirin EC 81 MG tablet Take 1 tablet (81 mg total) by mouth daily.  . bisacodyl (DULCOLAX) 5 MG EC tablet Take 10 mg by mouth daily as needed (for constipation.).  [name] esomeprazole (NEXIUM) 20 MG capsule Take 20 mg by mouth daily at 12 noon.  . ezetimibe (ZETIA) 10 MG tablet Take 1 tablet (10 mg total) by mouth daily.  . famotidine (PEPCID) 20 MG tablet Take 20 mg by mouth daily.   . fluticasone (FLONASE) 50 MCG/ACT nasal spray SHAKE LIQUID AND USE 2 SPRAYS IN EACH NOSTRIL DAILY AS NEEDED FOR ALLERGIES  . glimepiride (AMARYL) 4 MG tablet Take 1 tablet (4 mg total) by mouth daily before breakfast.  . glucose blood (ONETOUCH VERIO) test strip TEST BLOOD SUGAR TWICE DAILY. DXE11.9  . insulin degludec (TRESIBA) 100 UNIT/ML SOPN FlexTouch Pen Inject 0.1 mLs (10 Units total)  into the skin daily.  . Insulin Pen Needle (B-D UF III MINI PEN NEEDLES) 31G X 5 MM MISC Use once daily for insulin injection  . metFORMIN (GLUCOPHAGE) 500 MG tablet Take 2 tablets (1,000 mg total) by mouth 2 (two) times daily with a meal.  . metoprolol succinate (TOPROL-XL) 25 MG 24 hr tablet TAKE 1 TABLET BY MOUTH  EVERY EVENING. TAKE WITH OR IMMEDIATELY FOLLOWING A  MEAL.  . Multiple Vitamins-Minerals (ICAPS AREDS 2) CAPS Take 1 capsule by mouth 2 (two) times daily.  Glory Rosebush DELICA LANCETS 29F MISC USE TO TEST TWICE DAILY. DX E11.9  . PREMARIN vaginal cream INSERT 1 APPLICATORFUL VAGINALLY DAILY  . rosuvastatin (CRESTOR) 5 MG tablet Take 0.5 tablets (2.5 mg total) by mouth every other day.  . sacubitril-valsartan (ENTRESTO) 24-26 MG Take 1 tablet by mouth 2 (two) times daily.  [name] zolpidem (AMBIEN) 5 MG tablet TAKE 1 TABLET(5 MG) BY MOUTH AT BEDTIME AS NEEDED FOR SLEEP     Allergies:   Cortisone; Statins; Sulfamethoxazole; Codeine; Amiodarone; and Lactose intolerance (gi)   Past Medical History:  Diagnosis Date  . Allergy   . Anxiety   . Arthritis   .  Atrial enlargement, left   . CHF (congestive heart failure) ([place])   . Coronary artery disease   . Depression   . Diabetes mellitus without complication (HCC)    fasting cbg 110s  . Dyspnea   . GERD (gastroesophageal reflux disease)   . H/O hiatal hernia   . Headache(784.0)   . Heart murmur    06/10/2014 seeing new cardiologist  . Heart murmur   . HOH (hard of hearing)    HOH in left ear; needs to speak to patient in right ear  . Hyperlipidemia   . Hypertension   . Mitral regurgitation   . Mitral valve prolapse   . Palpitations    afib on 03/04/17 EKG  . PONV (postoperative nausea and vomiting)    Patient stated "I like the patch behind my ear"  . 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 S/P mitral valve replacement with bioprosthetic valve 04/23/2017   [facility] Mitral bovine pericardial  tissue valve: Model 7300TFX, Serial [id number], size 31  . UTI (lower urinary tract infection)     Past Surgical History:  Procedure Laterality Date  . ABDOMINAL HYSTERECTOMY    . Anterior posterior and enterocele repairs  09/21/2004   With uterosacral cardinal colposuspension, partial colpocleisis; Selinda Orion, MD  . BACK SURGERY    . BREAST ENHANCEMENT SURGERY  02/25/2002   Bilateral reduction and excision of accessory breast tissue underneath the left breast; Aretha Parrot., MD  . BREAST SURGERY  11   reduction  . CARDIAC CATHETERIZATION     no PCI  . CARDIAC CATHETERIZATION  05/02/2017  . CATARACT EXTRACTION Bilateral   . CLIPPING OF ATRIAL APPENDAGE  04/23/2017   Procedure: CLIPPING OF LEFT ATRIAL APPENDAGE;  Surgeon: [name], MD;  Location: [place];  Service: Open Heart Surgery;;  . COLONOSCOPY    . CORONARY ARTERY BYPASS GRAFT N/A 04/23/2017   Procedure: CORONARY ARTERY BYPASS GRAFTING (CABG), ON PUMP, TIMES ONE, USING ENDOSCOPICALLY HARVESTED RIGHT GREATER SAPHENOUS VEIN;  Surgeon: [name], MD;  Location: [place];  Service: Open Heart Surgery;  Laterality: N/A;  . EYE SURGERY Bilateral    cataract removal  . LUMBAR LAMINECTOMY/ DECOMPRESSION WITH MET-RX N/A 03/16/2014   Procedure: LUMBAR FIVE-SACRAL ONE EXTRAFORAMINAL DISKECTOMY WITH METREX;  Surgeon: Kristeen Miss, MD;  Location: [place] [facility]UR[facility];  Service: Neurosurgery;  Laterality: N/A;  . LUMBAR LAMINECTOMY/DECOMPRESSION MICRODISCECTOMY Left 06/08/2014   Procedure: Left Lumbar Five-Sacral One Microdiskectomy;  Surgeon: Kristeen Miss, MD;  Location: [place] NEUR[facility]S;  Service: Neurosurgery;  Laterality: Left;  Left L5-S1 Microdiskectomy  . MITRAL VALVE REPLACEMENT N/A 04/23/2017   Procedure: MITRAL VALVE (MV) REPLACEMENT;  Surgeon: [name], MD;  Location: [place];  Service: Open Heart Surgery;  Laterality: N/A;  . PACEMAKER IMPLANT N/A 09/16/2017   Procedure: PACEMAKER IMPLANT;  Surgeon: Constance Haw,  MD;  Location: [place] CV LAB;  Service: Cardiovascular;  Laterality: N/A;  . PELVIC FLOOR REPAIR    . POSTERIOR LUMBAR FUSION 4 LEVEL N/A 08/08/2015   Procedure: T8-L1 posterior lateral fusion with decompression T12-L1;  Surgeon: Kristeen Miss, MD;  Location: [place] NEURO ORS;  Service: Neurosurgery;  Laterality: N/A;  T8-L1 posterior lateral fusion with decompression T12-L1  . REDUCTION MAMMAPLASTY Bilateral   . RIGHT/LEFT HEART CATH AND CORONARY ANGIOGRAPHY N/A 03/28/2017   Procedure: Right/Left Heart Cath and Coronary Angiography;  Surgeon: Nelva Bush, MD;  Location: [place]  CV LAB;  Service: Cardiovascular;  Laterality: N/A;  . ROTATOR CUFF REPAIR Bilateral    11.12  . TEE WITHOUT CARDIOVERSION N/A 03/04/2017   Procedure: TRANSESOPHAGEAL ECHOCARDIOGRAM (TEE);  Surgeon: Fay Records, MD;  Location: [place];  Service: Cardiovascular;  Laterality: N/A;  . TEE WITHOUT CARDIOVERSION N/A 04/23/2017   Procedure: TRANSESOPHAGEAL ECHOCARDIOGRAM (TEE);  Surgeon: [name], MD;  Location: [place];  Service: Open Heart Surgery;  Laterality: N/A;  . TONSILLECTOMY AND ADENOIDECTOMY       Social History:  The patient  reports that she has never smoked. She has never used smokeless tobacco. She reports that she does not drink alcohol or use drugs.   Family History:  The patient's family history includes Arthritis in her father and mother; Heart disease in her father; Heart failure in her father; Hyperlipidemia in her father and mother; Hypertension in her mother.    ROS:  Please see the history of present illness. All other systems are reviewed and  Negative to the above problem except as noted.    PHYSICAL EXAM: VS:  BP 100/60   Pulse 65   Ht '5\' 6"'  (1.676 m)   Wt 139 lb 6.4 oz (63.2 kg)   SpO2 98%   BMI 22.50 kg/m   GEN:  Thin 85 yo in no acute distress  HEENT: normal  Neck: JVP is normal    Cardiac: RRR; no signif murmurs or gallops.  No LE edema   Respiratory:  CTA   No  rales or wheezes   GI: soft, nontender, nondistended, + BS  No hepatomegaly  MS: no deformity Moving all extremities   Skin: warm and dry, no rash Neuro:  Strength and sensation are intact Psych: euthymic mood, full affect   EKG:  EKG is not ordered today.    Lipid Panel    Component Value Date/Time   CHOL 175 07/14/2018 1111   TRIG 133 07/14/2018 1111   HDL 42 07/14/2018 1111   CHOLHDL 4.2 07/14/2018 1111   CHOLHDL 2.5 08/22/2016 0746   VLDL 20 08/22/2016 0746   LDLCALC 106 (H) 07/14/2018 1111   LDLDIRECT 143.6 06/04/2012 0931      Wt Readings from Last 3 Encounters:  09/16/18 139 lb 6.4 oz (63.2 kg)  07/14/18 139 lb 9.6 oz (63.3 kg)  07/09/18 139 lb 14.4 oz (63.5 kg)      ASSESSMENT AND PLAN:  1  Hx of  systolic CHF.LVEF is low normal   Curr on [place], toprol XLOn echo last March 2019    Curr volume status is good    I would keep on same regimen  2  Rhythm:   Pt had afib post op then junctional rhythm   Has recovered  HR is regular today       3  CAD  S/p 1 V CABG  NO symptoms of angina  4  MV disease  S/P MV replacement    5  HL  Continues on Zetia   She started Crestor 5 mg back   Denies signif cramping    Will check lpids in several wks    Current medicines are reviewed at length with the patient today.  The patient does not have concerns regarding medicines.  Signed, Dorris Carnes, MD  09/16/2018 10:14 AM    [address], [place], [place]  [address] Phone: [phone]; Fax: [phone]

## 2018-09-16 NOTE — Patient Instructions (Signed)
Medication Instructions:  Increase Crestor to 10 mg every day  If you need a refill on your cardiac medications before your next appointment, please call your pharmacy.   Lab work: Call to schedule a day to come for lab work to check your cholesterol and liver function.  If you have labs (blood work) drawn today and your tests are completely normal, you will receive your results only by: [name] MyChart Message (if you have MyChart) OR . A paper copy in the mail If you have any lab test that is abnormal or we need to change your treatment, we will call you to review the results.  Testing/Procedures: none   Any Other Special Instructions Will Be Listed Below (If Applicable).

## 2018-09-17 ENCOUNTER — Ambulatory Visit: Payer: Medicare Other | Admitting: Cardiology

## 2018-09-18 DIAGNOSIS — M5481 Occipital neuralgia: Secondary | ICD-10-CM | POA: Diagnosis not present

## 2018-09-19 ENCOUNTER — Ambulatory Visit: Payer: Medicare Other | Admitting: Internal Medicine

## 2018-09-22 ENCOUNTER — Other Ambulatory Visit: Payer: Self-pay | Admitting: Family Medicine

## 2018-09-22 NOTE — Telephone Encounter (Signed)
Called patient and she stated that she is currently taking this medication. Refill sent for one year.

## 2018-09-22 NOTE — Telephone Encounter (Signed)
Last OV 07/09/18, Next OV 10/07/18  Not on current medication list, last filled had a note that medication is no longer needed.  Is the patient taking this?

## 2018-09-22 NOTE — Telephone Encounter (Signed)
Clarify if she is still taking.  If not on list, I would assume she is no longer taking- but we need to confirm.  If she is taking, may refill for one year.

## 2018-09-24 NOTE — Progress Notes (Signed)
Office visit note and last echo printed to give to patient [place].  Pt coming 10/08/18 for fasting labs.  Has already signed release for records.

## 2018-10-02 ENCOUNTER — Telehealth: Payer: Self-pay | Admitting: Internal Medicine

## 2018-10-02 MED ORDER — SACUBITRIL-VALSARTAN 24-26 MG PO TABS: 1.0000 | ORAL_TABLET | Freq: Two times a day (BID) | ORAL | 3 refills | Status: DC

## 2018-10-02 NOTE — Telephone Encounter (Signed)
Pt states she is coming in for blood work soon and wants to make sure that the lab has the information needed to complete her blood work.  Informed pt that orders are in and lab is aware.  She also request 90 day Rx for Entresto be sent to [facility]. Informed that I would send in per her request.  She also states that she will change her address when she comes in for her blood work. She appreciates our return call.

## 2018-10-02 NOTE — Telephone Encounter (Signed)
New Message        Patient would like a call back concerning her labs and medication. She also states she needed to change her address, I offered to change it for her but she insisted coming in and getting a phone call.

## 2018-10-07 ENCOUNTER — Other Ambulatory Visit: Payer: Self-pay

## 2018-10-07 ENCOUNTER — Ambulatory Visit (INDEPENDENT_AMBULATORY_CARE_PROVIDER_SITE_OTHER): Payer: Medicare Other | Admitting: Family Medicine

## 2018-10-07 ENCOUNTER — Encounter: Payer: Self-pay | Admitting: Family Medicine

## 2018-10-07 VITALS — BP 110/66 | HR 58 | Temp 97.4°F | Ht 66.0 in | Wt 143.0 lb

## 2018-10-07 DIAGNOSIS — E1122 Type 2 diabetes mellitus with diabetic chronic kidney disease: Secondary | ICD-10-CM | POA: Diagnosis not present

## 2018-10-07 DIAGNOSIS — N952 Postmenopausal atrophic vaginitis: Secondary | ICD-10-CM

## 2018-10-07 DIAGNOSIS — I5022 Chronic systolic (congestive) heart failure: Secondary | ICD-10-CM | POA: Diagnosis not present

## 2018-10-07 DIAGNOSIS — Z794 Long term (current) use of insulin: Secondary | ICD-10-CM | POA: Diagnosis not present

## 2018-10-07 LAB — POCT GLYCOSYLATED HEMOGLOBIN (HGB A1C): Hemoglobin A1C: 6.9 % — AB (ref 4.0–5.6)

## 2018-10-07 MED ORDER — ESTROGENS, CONJUGATED 0.625 MG/GM VA CREA: TOPICAL_CREAM | VAGINAL | 3 refills | Status: AC

## 2018-10-07 MED ORDER — INSULIN DEGLUDEC 100 UNIT/ML ~~LOC~~ SOPN: 10.0000 [IU] | PEN_INJECTOR | Freq: Every day | SUBCUTANEOUS | 3 refills | Status: AC

## 2018-10-07 NOTE — Patient Instructions (Signed)
If you continue to have frequent blood sugars below 70, go ahead and reduce the Amaryl to one half tablet once daily.

## 2018-10-07 NOTE — Progress Notes (Signed)
Subjective:     Patient ID: [name], female   DOB: [birth year], 85 y.o.   MRN: [id number]  HPI Patient is seen for medical follow-up  Type 2 diabetes.  She had been on metformin and glimepiride but had A1c 8.9% back in the fall.  We added long-acting insulin with Tyler Aas currently 10 units once daily and blood sugars are greatly improved.  In fact, she had recent glucose of 62 but most of her fastings have been between 80 and 100.  She has gained some weight likely related to the stabilization of her glucose.  She feels better overall.  Still not exercising regularly.  Postmenopausal atrophic vaginitis.  She uses topical estrogen currently only about once per week.  She needs refills.  Also requesting refills of her [place].  Other chronic problems include history of atrial fibrillation, hypertension, systolic heart failure, CAD, history of mitral valve replacement, macular degeneration.  Recent orthopnea or dyspnea with exertion.  No increased peripheral edema.  Past Medical History:  Diagnosis Date  . Allergy   . Anxiety   . Arthritis   . Atrial enlargement, left   . CHF (congestive heart failure) ([place])   . Coronary artery disease   . Depression   . Diabetes mellitus without complication (HCC)    fasting cbg 110s  . Dyspnea   . GERD (gastroesophageal reflux disease)   . H/O hiatal hernia   . Headache(784.0)   . Heart murmur    06/10/2014 seeing new cardiologist  . Heart murmur   . HOH (hard of hearing)    HOH in left ear; needs to speak to patient in right ear  . Hyperlipidemia   . Hypertension   . Mitral regurgitation   . Mitral valve prolapse   . Palpitations    afib on 03/04/17 EKG  . PONV (postoperative nausea and vomiting)    Patient stated "I like the patch behind my ear"  . 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 S/P mitral valve replacement with bioprosthetic valve 04/23/2017   [facility] Mitral bovine pericardial tissue valve:  Model 7300TFX, Serial [id number], size 31  . UTI (lower urinary tract infection)    Past Surgical History:  Procedure Laterality Date  . ABDOMINAL HYSTERECTOMY    . Anterior posterior and enterocele repairs  09/21/2004   With uterosacral cardinal colposuspension, partial colpocleisis; Selinda Orion, MD  . BACK SURGERY    . BREAST ENHANCEMENT SURGERY  02/25/2002   Bilateral reduction and excision of accessory breast tissue underneath the left breast; Aretha Parrot., MD  . BREAST SURGERY  11   reduction  . CARDIAC CATHETERIZATION     no PCI  . CARDIAC CATHETERIZATION  05/02/2017  . CATARACT EXTRACTION Bilateral   . CLIPPING OF ATRIAL APPENDAGE  04/23/2017   Procedure: CLIPPING OF LEFT ATRIAL APPENDAGE;  Surgeon: [name], MD;  Location: [place];  Service: Open Heart Surgery;;  . COLONOSCOPY    . CORONARY ARTERY BYPASS GRAFT N/A 04/23/2017   Procedure: CORONARY ARTERY BYPASS GRAFTING (CABG), ON PUMP, TIMES ONE, USING ENDOSCOPICALLY HARVESTED RIGHT GREATER SAPHENOUS VEIN;  Surgeon: [name], MD;  Location: [place];  Service: Open Heart Surgery;  Laterality: N/A;  . EYE SURGERY Bilateral    cataract removal  . LUMBAR LAMINECTOMY/ DECOMPRESSION WITH MET-RX N/A 03/16/2014   Procedure: LUMBAR FIVE-SACRAL ONE EXTRAFORAMINAL DISKECTOMY WITH METREX;  Surgeon: Kristeen Miss, MD;  Location:  [place] NEURO ORS;  Service: Neurosurgery;  Laterality: N/A;  . LUMBAR LAMINECTOMY/DECOMPRESSION MICRODISCECTOMY Left 06/08/2014   Procedure: Left Lumbar Five-Sacral One Microdiskectomy;  Surgeon: Kristeen Miss, MD;  Location: [place] NEURO ORS;  Service: Neurosurgery;  Laterality: Left;  Left L5-S1 Microdiskectomy  . MITRAL VALVE REPLACEMENT N/A 04/23/2017   Procedure: MITRAL VALVE (MV) REPLACEMENT;  Surgeon: [name], MD;  Location: [place];  Service: Open Heart Surgery;  Laterality: N/A;  . PACEMAKER IMPLANT N/A 09/16/2017   Procedure: PACEMAKER IMPLANT;  Surgeon: Constance Haw, MD;  Location: [place] CV LAB;  Service: Cardiovascular;  Laterality: N/A;  . PELVIC FLOOR REPAIR    . POSTERIOR LUMBAR FUSION 4 LEVEL N/A 08/08/2015   Procedure: T8-L1 posterior lateral fusion with decompression T12-L1;  Surgeon: Kristeen Miss, MD;  Location: [place] NE[facility] ORS;  Service: Neurosurgery;  Laterality: N/A;  T8-L1 posterior lateral fusion with decompression T12-L1  . REDUCTION MAMMAPLASTY Bilateral   . RIGHT/LEFT HEART CATH AND CORONARY ANGIOGRAPHY N/A 03/28/2017   Procedure: Right/Left Heart Cath and Coronary Angiography;  Surgeon: Nelva Bush, MD;  Location: [place] CV LAB;  Service: Cardiovascular;  Laterality: N/A;  . ROTATOR CUFF REPAIR Bilateral    11.12  . TEE WITHOUT CARDIOVERSION N/A 03/04/2017   Procedure: TRANSESOPHAGEAL ECHOCARDIOGRAM (TEE);  Surgeon: Fay Records, MD;  Location: [place];  Service: Cardiovascular;  Laterality: N/A;  . TEE WITHOUT CARDIOVERSION N/A 04/23/2017   Procedure: TRANSESOPHAGEAL ECHOCARDIOGRAM (TEE);  Surgeon: [name], MD;  Location: [place];  Service: Open Heart Surgery;  Laterality: N/A;  . TONSILLECTOMY AND ADENOIDECTOMY      reports that she has never smoked. She has never used smokeless tobacco. She reports that she does not drink alcohol or use drugs. family history includes Arthritis in her father and mother; Heart disease in her father; Heart failure in her father; Hyperlipidemia in her father and mother; Hypertension in her mother. Allergies  Allergen Reactions  . Cortisone Other (See Comments)    Insomnia, heart palpitations (po only)  . Statins Other (See Comments)    Myalgias   . Sulfamethoxazole Other (See Comments)    Unsure of reaction  . Codeine Swelling    FACIAL SWELLING SEVERITY UNKNOWN  . Amiodarone Other (See Comments)    Tremors with 400 mg BID   . Lactose Intolerance (Gi) Nausea And Vomiting     Review of Systems  Constitutional: Negative for fatigue.  Eyes: Negative for visual disturbance.  Respiratory:  Negative for cough, chest tightness, shortness of breath and wheezing.   Cardiovascular: Negative for chest pain, palpitations and leg swelling.  Endocrine: Negative for polydipsia and polyuria.  Neurological: Negative for dizziness, seizures, syncope, weakness, light-headedness and headaches.       Objective:   Physical Exam Constitutional:      Appearance: She is well-developed.  Eyes:     Pupils: Pupils are equal, round, and reactive to light.  Neck:     Musculoskeletal: Neck supple.     Thyroid: No thyromegaly.     Vascular: No JVD.  Cardiovascular:     Rate and Rhythm: Normal rate and regular rhythm.     Heart sounds: No gallop.   Pulmonary:     Effort: Pulmonary effort is normal. No respiratory distress.     Breath sounds: Normal breath sounds. No wheezing or rales.  Musculoskeletal:     Right lower leg: No edema.     Left lower leg: No edema.  Neurological:  Mental Status: She is alert.        Assessment:     #1 type 2 diabetes greatly improved with addition of low-dose Tresiba.  A1c today 6.9% compared with 8.9% last fall  #2 atrophic vaginitis  #3 history of chronic systolic heart failure symptomatically stable.    Plan:     -Refill Tresiba and topical estrogen -We have also recommend she use a daily moisturizer intravaginally -We discussed possibly tapering back her glimepiride especially at follow-up if her A1c remains well controlled -42-monthroutine follow-up  BEulas PostMD [facility]

## 2018-10-08 ENCOUNTER — Other Ambulatory Visit: Payer: Medicare Other

## 2018-10-09 ENCOUNTER — Telehealth (HOSPITAL_COMMUNITY): Payer: Self-pay | Admitting: Licensed Clinical Social Worker

## 2018-10-09 NOTE — Telephone Encounter (Signed)
CSW called pt to see if she would like to renew PAN foundation assistance for [employer]- pt agreeable- grant awarded:  Member ID: [id number] Group ID: [id number] RxBin ID: [id number] PCN: [id number] Eligibility Start Date: 09/16/2018 Eligibility End Date: 09/16/2019 Assistance Amount: $1,000.00  Pt reports she is moving to [place] in February- new address updated on pt PAN profile  Jorge Ny, [place] Worker [facility] [phone]

## 2018-10-10 ENCOUNTER — Ambulatory Visit: Payer: Medicare Other | Admitting: Family Medicine

## 2018-10-28 ENCOUNTER — Other Ambulatory Visit: Payer: Medicare Other

## 2018-11-06 ENCOUNTER — Other Ambulatory Visit (HOSPITAL_COMMUNITY): Payer: Self-pay | Admitting: Internal Medicine

## 2018-12-01 ENCOUNTER — Ambulatory Visit: Payer: Medicare Other | Admitting: Internal Medicine

## 2018-12-05 ENCOUNTER — Ambulatory Visit: Payer: Medicare Other | Admitting: Internal Medicine

## 2019-01-20 ENCOUNTER — Other Ambulatory Visit: Payer: Self-pay | Admitting: Family Medicine

## 2019-01-21 NOTE — Telephone Encounter (Signed)
Called patient and left a detailed voice message to let her know that she was due an follow up for her diabetes on April 30th and she can call us to schedule a telephone visit to update her chart for her medication refills.  OK for PEC to discuss/advise/schedule patient for telephone follow up.  CRM Created.

## 2019-03-27 ENCOUNTER — Other Ambulatory Visit: Payer: Self-pay | Admitting: Family Medicine

## 2019-04-19 ENCOUNTER — Other Ambulatory Visit: Payer: Self-pay | Admitting: Internal Medicine

## 2019-06-10 ENCOUNTER — Other Ambulatory Visit: Payer: Self-pay | Admitting: Internal Medicine

## 2019-07-09 ENCOUNTER — Other Ambulatory Visit: Payer: Self-pay | Admitting: Internal Medicine

## 2019-07-09 ENCOUNTER — Other Ambulatory Visit: Payer: Self-pay | Admitting: Family Medicine

## 2019-07-10 NOTE — Telephone Encounter (Signed)
Ok to refill for 30 days  

## 2019-07-10 NOTE — Telephone Encounter (Signed)
Please advise 

## 2019-08-28 ENCOUNTER — Other Ambulatory Visit: Payer: Self-pay | Admitting: Internal Medicine

## 2019-10-10 ENCOUNTER — Other Ambulatory Visit: Payer: Self-pay | Admitting: Internal Medicine

## 2019-11-03 ENCOUNTER — Other Ambulatory Visit: Payer: Self-pay | Admitting: Internal Medicine

## 2022-09-10 DEATH — deceased
